# Patient Record
Sex: Male | Born: 1940
Health system: Southern US, Community
[De-identification: ages and names within clinical notes are randomized; demographics above are authoritative.]

## PROBLEM LIST (undated history)

## (undated) DIAGNOSIS — G20 Parkinson's disease: Secondary | ICD-10-CM

## (undated) DIAGNOSIS — I5042 Chronic combined systolic (congestive) and diastolic (congestive) heart failure: Secondary | ICD-10-CM

## (undated) DIAGNOSIS — I219 Acute myocardial infarction, unspecified: Secondary | ICD-10-CM

## (undated) DIAGNOSIS — G709 Myoneural disorder, unspecified: Secondary | ICD-10-CM

## (undated) DIAGNOSIS — K219 Gastro-esophageal reflux disease without esophagitis: Secondary | ICD-10-CM

## (undated) DIAGNOSIS — R32 Unspecified urinary incontinence: Secondary | ICD-10-CM

## (undated) DIAGNOSIS — I2510 Atherosclerotic heart disease of native coronary artery without angina pectoris: Secondary | ICD-10-CM

## (undated) DIAGNOSIS — I059 Rheumatic mitral valve disease, unspecified: Secondary | ICD-10-CM

## (undated) DIAGNOSIS — I10 Essential (primary) hypertension: Secondary | ICD-10-CM

## (undated) DIAGNOSIS — E785 Hyperlipidemia, unspecified: Secondary | ICD-10-CM

## (undated) DIAGNOSIS — F419 Anxiety disorder, unspecified: Secondary | ICD-10-CM

## (undated) DIAGNOSIS — I429 Cardiomyopathy, unspecified: Secondary | ICD-10-CM

## (undated) DIAGNOSIS — F0390 Unspecified dementia without behavioral disturbance: Secondary | ICD-10-CM

## (undated) DIAGNOSIS — G20A1 Parkinson's disease without dyskinesia, without mention of fluctuations: Secondary | ICD-10-CM

## (undated) DIAGNOSIS — I739 Peripheral vascular disease, unspecified: Secondary | ICD-10-CM

## (undated) HISTORY — DX: Atherosclerotic heart disease of native coronary artery without angina pectoris: I25.10

## (undated) HISTORY — PX: BLADDER REPAIR: SHX76

## (undated) HISTORY — PX: PROSTATECTOMY: SHX69

## (undated) HISTORY — PX: BLADDER SURGERY: SHX569

---

## 2002-06-06 ENCOUNTER — Inpatient Hospital Stay (HOSPITAL_COMMUNITY): Admission: EM | Admit: 2002-06-06 | Discharge: 2002-06-08 | Payer: Self-pay | Admitting: *Deleted

## 2004-05-02 ENCOUNTER — Emergency Department (HOSPITAL_COMMUNITY): Admission: EM | Admit: 2004-05-02 | Discharge: 2004-05-02 | Payer: Self-pay | Admitting: Emergency Medicine

## 2006-09-08 ENCOUNTER — Encounter (INDEPENDENT_AMBULATORY_CARE_PROVIDER_SITE_OTHER): Payer: Self-pay | Admitting: Specialist

## 2006-09-08 ENCOUNTER — Inpatient Hospital Stay (HOSPITAL_COMMUNITY): Admission: RE | Admit: 2006-09-08 | Discharge: 2006-09-14 | Payer: Self-pay | Admitting: Urology

## 2006-09-30 ENCOUNTER — Inpatient Hospital Stay (HOSPITAL_COMMUNITY): Admission: EM | Admit: 2006-09-30 | Discharge: 2006-10-04 | Payer: Self-pay | Admitting: Emergency Medicine

## 2006-10-07 ENCOUNTER — Encounter (HOSPITAL_COMMUNITY): Admission: RE | Admit: 2006-10-07 | Discharge: 2006-11-06 | Payer: Self-pay | Admitting: Urology

## 2006-10-08 ENCOUNTER — Ambulatory Visit (HOSPITAL_COMMUNITY): Payer: Self-pay | Admitting: Urology

## 2006-10-20 ENCOUNTER — Ambulatory Visit (HOSPITAL_COMMUNITY): Admission: RE | Admit: 2006-10-20 | Discharge: 2006-10-20 | Payer: Self-pay | Admitting: Family Medicine

## 2009-08-28 ENCOUNTER — Ambulatory Visit (HOSPITAL_COMMUNITY): Admission: RE | Admit: 2009-08-28 | Discharge: 2009-08-28 | Payer: Self-pay | Admitting: Chiropractic Medicine

## 2010-02-25 ENCOUNTER — Ambulatory Visit (HOSPITAL_COMMUNITY): Admission: RE | Admit: 2010-02-25 | Discharge: 2010-02-25 | Payer: Self-pay | Admitting: Internal Medicine

## 2010-03-24 ENCOUNTER — Encounter: Payer: Self-pay | Admitting: Internal Medicine

## 2010-04-09 ENCOUNTER — Telehealth (INDEPENDENT_AMBULATORY_CARE_PROVIDER_SITE_OTHER): Payer: Self-pay

## 2010-04-10 ENCOUNTER — Encounter: Payer: Self-pay | Admitting: Internal Medicine

## 2010-04-14 ENCOUNTER — Ambulatory Visit: Payer: Self-pay | Admitting: Internal Medicine

## 2010-04-14 ENCOUNTER — Ambulatory Visit (HOSPITAL_COMMUNITY): Admission: RE | Admit: 2010-04-14 | Discharge: 2010-04-14 | Payer: Self-pay | Admitting: Internal Medicine

## 2010-05-05 ENCOUNTER — Ambulatory Visit (HOSPITAL_COMMUNITY): Admission: RE | Admit: 2010-05-05 | Discharge: 2010-05-05 | Payer: Self-pay | Admitting: Family Medicine

## 2011-01-15 NOTE — Letter (Signed)
Summary: Internal Other Domingo Dimes  Internal Other Domingo Dimes   Imported By: Cloria Spring LPN 16/09/9603 54:09:81  _____________________________________________________________________  External Attachment:    Type:   Image     Comment:   External Document

## 2011-01-15 NOTE — Progress Notes (Signed)
Summary: phone note/ pt on iron per wife  Phone Note Call from Patient   Caller: Spouse Summary of Call: Pt's wife called to say pt is scheduled for TCS on 04/14/2010. He has been on iron once daily for the last 3 weeks, he is anemic.  He takes the iron at night. I told her to have him hold the iron. I will check with Dr. Jena Gauss and see if he needs to be rescheduled. I also have faxed a request to [facility] for a copy of his recent labs. Initial call taken by: Cloria Spring LPN,  April 09, 2010 11:53 AM     Appended Document: phone note/ pt on iron per wife holding iron for now until the colonoscopy should be okay  Appended Document: phone note/ pt on iron per wife pts wife aware

## 2011-01-15 NOTE — Letter (Signed)
Summary: External Other /Labs from Dr. Sherwood Gambler  External Other Vickie Epley from Dr. Sherwood Gambler   Imported By: Cloria Spring LPN 16/09/9603 54:09:81  _____________________________________________________________________  External Attachment:    Type:   Image     Comment:   External Document

## 2011-03-04 ENCOUNTER — Emergency Department (HOSPITAL_COMMUNITY): Payer: PRIVATE HEALTH INSURANCE

## 2011-03-04 ENCOUNTER — Emergency Department (HOSPITAL_COMMUNITY)
Admission: EM | Admit: 2011-03-04 | Discharge: 2011-03-04 | Disposition: A | Discharge status: Home / Self Care | Payer: PRIVATE HEALTH INSURANCE | Attending: Emergency Medicine | Admitting: Emergency Medicine

## 2011-03-04 DIAGNOSIS — G20 Parkinson's disease: Secondary | ICD-10-CM | POA: Insufficient documentation

## 2011-03-04 DIAGNOSIS — I10 Essential (primary) hypertension: Secondary | ICD-10-CM | POA: Insufficient documentation

## 2011-03-04 DIAGNOSIS — IMO0002 Reserved for concepts with insufficient information to code with codable children: Secondary | ICD-10-CM | POA: Insufficient documentation

## 2011-03-04 DIAGNOSIS — R112 Nausea with vomiting, unspecified: Secondary | ICD-10-CM | POA: Insufficient documentation

## 2011-03-04 DIAGNOSIS — G20A1 Parkinson's disease without dyskinesia, without mention of fluctuations: Secondary | ICD-10-CM | POA: Insufficient documentation

## 2011-03-04 DIAGNOSIS — S7000XA Contusion of unspecified hip, initial encounter: Secondary | ICD-10-CM | POA: Insufficient documentation

## 2011-03-04 DIAGNOSIS — Z79899 Other long term (current) drug therapy: Secondary | ICD-10-CM | POA: Insufficient documentation

## 2011-03-04 DIAGNOSIS — W108XXA Fall (on) (from) other stairs and steps, initial encounter: Secondary | ICD-10-CM | POA: Insufficient documentation

## 2011-03-04 DIAGNOSIS — M549 Dorsalgia, unspecified: Secondary | ICD-10-CM | POA: Insufficient documentation

## 2011-03-04 DIAGNOSIS — S0990XA Unspecified injury of head, initial encounter: Secondary | ICD-10-CM | POA: Insufficient documentation

## 2011-05-01 NOTE — Op Note (Signed)
NAMECUINN, WESTERHOLD               ACCOUNT NO.:  [id number]   MEDICAL RECORD NO.:  [id number]          PATIENT TYPE:  AMB   LOCATION:  DAY                           FACILITY:  [facility]   PHYSICIAN:  Ky Barban, M.D.DATE OF BIRTH:  June 21, 1941   DATE OF PROCEDURE:  09/08/2006  DATE OF DISCHARGE:                                 OPERATIVE REPORT   PREOPERATIVE DIAGNOSIS:  Carcinoma of prostate.   POSTOPERATIVE DIAGNOSIS:  Carcinoma of prostate.   OPERATION PERFORMED:  Bilateral pelvic node dissection, radical retropubic  prostatectomy, frozen sections.   SURGEON:  Ky Barban, M.D.   ASSISTANT:  Dennie Maizes, M.D.   ANESTHESIA:  General endotracheal.   ESTIMATED BLOOD LOSS:  2000 mL with replacement, 4 units of packed red blood  cells.   Instrument, needle, sponge count correct.   COMPLICATIONS:  None.   DESCRIPTION OF PROCEDURE:  The patient was given general endotracheal  anesthesia and semilithotomy position after usual prep and drape.  Suprapubic midline incision was made about three inches long, carried down  through the subcutaneous tissue.  The rectus sheath was incised, rectus  separated in the midline, retropubic space was entered.  The right and left  external iliac veins were exposed and self-retaining Turner-Warwick  retractors applied.  Proceeded to do the node dissection on the right side.  The fascia around the right external iliac vein was opened, obturator nerve  was identified and the tissue in between the external iliac vein and  obturator vessels and nerve was lifted off the psoas fascia with blunt and  sharp dissection.  Lymphatics were clipped and divided.  This tissue was  collected as the lymph nodes, pelvic and obturator lymph nodes, the area was  packed with a dry pack and same thing was done on the left side.  Specimen  was sent for frozen section.  The report came back negative for metastatic  disease.  Proceeded to the radical  prostatectomy.  The patient already had a  #20 Foley catheter in the bladder.  The puboprostatic ligaments were divided  and the apex of the prostate was exposed.  Stitch was placed using 0 Vicryl  in the dorsal vein complex and a second stitch was placed on the apex of the  prostate and between these two stitches, dorsal vein complex was divided.  The puboprostatic ligaments were divided.  The prostate was release and  McDougal clamp was passed behind the remaining part of the dorsal vein  complex.  It was ligated and divided.  Now a long rectangular tape was  passed behind the urethra, lifted it up and under direct vision, the  anterior wall of the urethra was divided.  I wanted to put a running stitch  of 3-0 Vicryl in the anterior wall of the bladder but this started to bleed  from the dorsal vein complex, so I had to grab the Foley catheter with the  long hemostat and divide it.  The posterior wall of the urethra was divided.  The prostate was lifted off the Denonvilliers's fascia and this area was  packed  to stop the bleeding.  Then I continued to do the dissection of the  prostate.  The rectum was pushed away from this side.  I had difficulty  exposing the seminal vesicles so it was decided to go from the front.  Anterior bladder neck was opened up and under direct vision the bladder neck  was exposed.  The Foley catheter was then removed.  A #14 red rubber  catheter was left in the prostate, held on a clamp.  The ureteral orifices  were identified and two 4 French ureteral catheters were passed up, one on  each side and held in the bladder with 4-0 chromic stitch.  Posterior  bladder neck was divided.  The bladder was separated from the prostate.  The  seminal vesicle and vasa deferentia were identified.  The vasa deferentia  were clipped and divided.  Right seminal vesicle was pulled out of the  sheath of the seminal vesicle artery was clipped.  The left seminal vesicle  was quite  stuck.  With some difficulty, I was able to get part of it out and  the remaining part separately taken out and once the seminal vesicles were  out, I had already ligated the superior pedicles of the prostate.  The  specimen came out.  The operative site was packed.  Proceeded to close the  bladder neck.  Bladder neck was closed with interrupted sutures of 2-0  Vicryl and it was left open size of my index finger, then the bladder neck  was covered with running suture of 4-0 chromic.  It was covered with the  bladder mucosa with running stitch.  Now I am ready for the anastomosis.  The packing was removed from the pelvis.  The operative site was irrigated  with about 1200 mL of saline.  I do not see any bleeding from the operative  site but there was a large bleeder in the dorsal vein complex so we put a  Foley catheter and I had to inflate the balloon and with traction in the  pelvic area, I was able to control the bleeding but we decided that it was  impossible to do the end-to-end anastomosis, maybe we will just have to use  traction on the catheter to approximate the bladder neck with the urethral  stump.  So I had to make a cystotomy to put a 0 Vicryl stitch through the  open end of the catheter.  It came out through the abdominal wall so that  the catheter does not come out, so the cystotomy was done and closed in  usual fashion.  The Omni retractor blades were removed.  I had already  placed the catheter into the bladder, inflated the balloon to 40 mL, so with  traction on the catheter, the bladder was pushed into the retropubic space  approximating the urethral stump with the bladder neck.  The patient had a  good stitch on the dorsal vein complex and also on the bladder neck, so  these stitches were tied.  Hopefully it will keep, along with the traction,  the stump of the urethra and the bladder neck in approximation.  There is no bleeding going on.  Retropubic space was drained with  __________  which came  out along with the urethral catheters through a separate stab wound and  stabilized to the skin with a 0 silk stitch and the rectus sheath was closed  with a running stitch of 0 Vicryl.  Skin was closed  with staples.  The Foley  catheter was held on traction during the procedure.  There was no bleeding  going on, so we decided to keep the catheter on traction.  Patient lost  about 2000 mL of blood.  He was given four units of blood.  He has stable  blood pressure running 140/80.  The Foley catheter free tie is tied over a  button on the skin and sterile dressing was applied.  Traction was applied  on the Foley catheter.  The patient left the operating room in satisfactory  condition.      Ky Barban, M.D.  Electronically Signed     MIJ/MEDQ  D:  09/08/2006  T:  09/10/2006  Job:  [id number]

## 2011-05-01 NOTE — Consult Note (Signed)
[name], [name]               ACCOUNT NO.:  [id number]   MEDICAL RECORD NO.:  [id number]          PATIENT TYPE:  INP   LOCATION:  IC03                          FACILITY:  [facility]   PHYSICIAN:  Mobolaji B. Bakare, M.D.DATE OF BIRTH:  May 14, 1941   DATE OF CONSULTATION:  DATE OF DISCHARGE:                                   CONSULTATION   REASON FOR CONSULTATION:  Hypoglycemia and low calcium   HISTORY OF PRESENT ILLNESS:  [name] is a 70 year old Caucasian male who  was admitted on September 08, 2006, for radical prostatectomy.  He underwent  surgery on the same day.  The patient was noted on BMET result early this  morning  over 500.  He has had a relatively normal glucose level since  admission ranging between 130-150.  He is not diabetic.  Albumin was also  noted to be 1.9.  [facility] was therefore called to appoint management.  The  patient received 11 units of insulin.  Two hours later he bottomed out and  became hypoglycemic and sweaty.  Blood glucose was 38 mg/dL.  He was treated  with  D-50, one ampoule and blood glucose now is 113.   Calcium was also noted to be 6.9.  Albumin was checked to be 1.9.  These  correspond to a corrected calcium of 8.5, which is within normal limits.  The patient has received one calcium gluconate.   REVIEW OF SYSTEMS:  The patient complained of sore throat.  No fever,  chills, cough, headache.  He is not having any diarrhea.  No abdominal pain.  Review of systems, otherwise, normal.   PAST MEDICAL HISTORY:  1. Depression.  2. Hypertension.  3. Anxiety.   PAST SURGICAL HISTORY:  Cholecystectomy status post inguinal hernia repair.   CURRENT MEDICATIONS:  1. Cefazolin 1 g IV q.8 h.  2. NovoLog sliding scale.  3. Morphine sulfate PCA.  4. Benadryl p.r.n.  5. Reglan p.r.n.  6. Zofran p.r.n.   MEDICATION PRIOR TO HOSPITALIZATION:  1. Paxil.  2. Xanax.  3. Hydrochlorothiazide.   ALLERGIES:  None.   FAMILY HISTORY:  Positive for  leukemia.   SOCIAL HISTORY:  The patient does not drink alcohol, does not smoke  cigarettes.   PHYSICAL EXAMINATION:  VITAL SIGNS:  Currently, blood pressure 140/70, heart  rate 95-100, O2 saturations 98% on 2 liters, respirations 15, I&O's  2513/2460  NG tube drainage 800.  Blood stained.  GENERAL:  Patient is not in  respiratory distress.  HEENT:  Normocephalic, atraumatic.  Pupils equal, round and reactive to  light.  NG tube in place, draining blood coffee-ground material.  Mucous  membranes moist without thrush.  LUNGS:  Clear to auscultation anteriorly.  CVS:  S1, S2.  No murmur, no gallop.  ABDOMEN:  Nondistended, soft, nontender.  Dressing over lower abdominal  wound.  Foley catheter in place.  EXTREMITIES:  No pedal edema.  No calf tenderness.  CNS:  No focal neurological deficits.   LABORATORY DATA:  Sodium 130, potassium 3.3, chloride 98, CO2 29, glucose  529, BUN 11, creatinine 1.0, calcium 6.9,  white cell 9.4, hemoglobin 10,  hematocrit 28.8, platelets 110, albumin 1.9.   ASSESSMENT/PLAN:  1. Hyperglycemia.  The patient one BMET reading of  glucose.  He became      hypoglycemic on receiving 11 units of insulin.  I doubt he is diabetic.      This may be spurious .  Will check hemoglobin A1C.  Monitor CBG q.6 h.      Will initiate sliding scale insulin if blood glucose is greater than      180.  The patient is n.p.o. for now.  2. Coffee-ground NG tube drainage, likely stress also.  We started      Protonix 20 mg q.12 h.  3. Thrombocytopenia.  The patient is not on heparin product.  Will monitor      closely.  There is no available baseline platelet at this time.  4. Oral thrush.  I will initiate Diflucan 200 mg IV x1, then 100 mg daily.  5. Hypocalcemia.  Corrected calcium is within low limits of normal.  Will      monitor closely.  He has received one ampoule of calcium gluconate .  6. Hypoalbuminemia.  Check prealbumin.  The patient may need nutritional       supplement when able to take orally.  7. Blood loss anemia.  He is status post blood transfusion.  Will continue      to monitor.  8. E. coli and urinary tract infection.  Continue cefazolin.  9. Hypokalemia.  Will continue repleting potassium in IV fluids.  Will      give two potassium chloride 10 mEq.  10.Depression.  11.Hypertension.  It is fairly controlled.  If he continues to be n.p.o.      and unable to medications, will consider Lopressor IV q.6 h at 5 mg IV      q.6h if blood pressure becomes uncontrolled.  12.Prostate cancer status post radical prostatectomy and management as per      primary service .   Thank you for the consultation, we will follow with you.      Mobolaji B. Corky Downs, M.D.  Electronically Signed    MBB/MEDQ  D:  09/10/2006  T:  09/10/2006  Job:  [id number]   cc:   Ky Barban, M.D.  Fax: [phone]

## 2011-05-01 NOTE — H&P (Signed)
[name], [name] NO.:  [id number]   MEDICAL RECORD NO.:  [id number]          PATIENT TYPE:  INP   LOCATION:  IC03                          FACILITY:  [facility]   PHYSICIAN:  Ky Barban, M.D.DATE OF BIRTH:  Apr 30, 1941   DATE OF ADMISSION:  09/08/2006  DATE OF DISCHARGE:  LH                                HISTORY & PHYSICAL   CHIEF COMPLAINT:  Prostate cancer.   A 70 year old male who is a patient of Dr. Rito Ehrlich and was primarily there  to see him in July, for an elevated PSA of 4.34 and last month he underwent  prostate ultrasound and biopsy.  The biopsy report came back positive for  adenocarcinoma of prostate and his Gleason grade is  3+2, Gleason score is  5.  The patient is otherwise in good general medical condition, so he is  advised.  He was seen both by me and Dr. Rito Ehrlich and we have advised him to  undergo radical prostatectomy although we also discussed other alternative  treatments, radiotherapy and watchful waiting, hormone treatment.  Because  of his age and good health, I think he is better off having radical  prostatectomy.  The procedure of radical prostatectomy was discussed with  him__________ told him that there are complications:  (1) Urinary  incontinence, which can be permanent; (2) erectile dysfunction which will be  permanent; (3) bleeding requiring blood transfusion in addition to usual  complication of any major operation.  He understands along with his family,  wants [place] to go ahead and do the surgery as soon as possible.  So he is  coming as outpatient to undergo radical retropubic prostatectomy, bilateral  pelvic node dissection, and then will be admitted in the hospital.   PAST HISTORY:  History of hypertension, depression, anxiety, status post  cholecystectomy, and status post inguinal hernia repair.   MEDICATIONS:  He is taking Paxil, Xanax, hydrochlorothiazide.   ALLERGIES:  None.   FAMILY HISTORY:  Positive for  leukemia.  There is no history of prostate  cancer.   PERSONAL HISTORY:  He does not smoke or drink.   REVIEW OF SYSTEMS:  Unremarkable.   PHYSICAL EXAMINATION:  GENERAL:  A well-nourished, well-developed male not  in acute distress.  VITAL SIGNS:  Blood pressure is 130/80, temperature is normal.  CENTRAL NERVOUS SYSTEM:  No gross neurological deficit.  HEAD AND NECK:  Negative.  CHEST:  Symmetrical, normal breath sounds.  CARDIAC:  Regular sinus rhythm.  No murmur.  ABDOMEN:  Soft, flat.  Liver, spleen, kidneys not felt.  There was no CVA  tenderness.  GENITOURINARY:  External genitalia is circumcised, meatus adequate.  Testicles are normal.  RECTAL:  Done by Dr. Rito Ehrlich and the prostate is 45 g, feels benign.   IMPRESSION:  1. Prostate cancer.  2. Hypertension.  3. Depression.   PLAN:  A radical retropubic prostatectomy, bilateral pelvic node dissection,  and admit him in the hospital and I will request Dr. Regino Schultze to follow him  in the hospital with [place].      Ky Barban, M.D.  Electronically  Signed     MIJ/MEDQ  D:  09/07/2006  T:  09/08/2006  Job:  [id number]   cc:   [facility]  Fax: [phone]   Kirk Ruths, M.D.  Fax: [phone]

## 2011-05-01 NOTE — Op Note (Signed)
[facility]  Patient:    [name], [name] Visit Number: [id number] MRN: [id number]          Service Type: MED Location: 3A A332 01 Attending Physician:  Dalia Heading Dictated by:   Franky Macho, M.D. Proc. Date: 06/07/02 Admit Date:  06/06/2002 Discharge Date: 06/08/2002   CC:         Jonell Cluck, M.D.  Renne Musca, M.D.   Operative Report  PREOPERATIVE DIAGNOSIS:  Acute appendicitis.  POSTOPERATIVE DIAGNOSIS:  Acute appendicitis.  PROCEDURE:  Laparoscopic appendectomy.  SURGEON:  Franky Macho, M.D.  ANESTHESIA:  General endotracheal.  INDICATIONS FOR PROCEDURE:  The patient is a 70 year old white male who presents with lower abdominal pain. CT scan of the abdomen and pelvis was performed which revealed possible early acute appendicitis. The risks and benefits of the procedure including bleeding, infection, and the possibility of an open procedure were fully explained to the patient, who gave informed consent.  DESCRIPTION OF PROCEDURE:  The patient was placed in the Trendelenburg position after induction of general endotracheal anesthesia. The abdomen was prepped and draped using the usual sterile technique with Betadine.  A supraumbilical incision was made down to the fascia. A Veress needle was introduced into the abdominal cavity and confirmation of placement was done using the saline drop test. The abdomen was then insufflated to 16 mmHg pressure. An 11 mm trocar was introduced into the abdominal cavity under direct visualization without difficulty. An additional 12 mm trocar was placed in the suprapubic region and a 5 mm trocar was placed in the left lower quadrant region. The appendix was inspected and noted to be diffusely inflamed and hard. The mesoappendix was divided using the harmonic scalpel. The base of the appendix was noted to supple. An endoGIA was placed across the base of the appendix and fired. The appendix  was removed using an endocatch bag. The appendiceal remnant was inspected and no abnormal bleeding was noted. There was no evidence of perforation. No other abnormal findings were found in the right lower quadrant. All fluid was then evacuated from the abdominal cavity prior to removal of the trocars.  All wounds were irrigated with normal saline. All wounds were injected with 0.5% Sensorcaine. The supraumbilical fascia as well as suprapubic fascia were reapproximated using an #0 Vicryl interrupted suture. All skin incisions were closed using staples. Betadine ointment and dry sterile dressings were applied.  All tape and needle counts were correct at the end of the procedure. The patient was extubated in the operating room and went back to the recovery room awake in stable condition.  COMPLICATIONS:  None.  SPECIMEN:  Appendix.  ESTIMATED BLOOD LOSS:  Minimal. Dictated by:   Franky Macho, M.D. Attending Physician:  Dalia Heading DD:  06/07/02 TD:  06/08/02 Job: [id number]

## 2011-05-01 NOTE — Discharge Summary (Signed)
[name], Ernest NO.:  [id number]   MEDICAL RECORD NO.:  [id number]          PATIENT TYPE:  INP   LOCATION:  A215                          FACILITY:  [facility]   PHYSICIAN:  Ky Barban, M.D.DATE OF BIRTH:  02-07-1941   DATE OF ADMISSION:  09/08/2006  DATE OF DISCHARGE:  10/02/2007LH                               DISCHARGE SUMMARY   HISTORY:  This 70 year old gentleman was admitted with diagnosis of  prostatic cancer to undergo radical retropubic prostatectomy.  His PSA  was elevated to 4.34.  A prostate biopsy shows that he has a Gleason  grade 3+ prostate cancer.  He was advised and discussed various  treatment options, so it was elected that he wanted to go ahead and have  surgery.  Various complications including urinary incontinence were  discussed and he understands.   HOSPITAL COURSE:  Routine admission workup CBC, urinalysis, and  __________ was normal.  EKG and chest x-ray were normal.  He was taken  to the operating room on September 26 and underwent a radical retropubic  prostatectomy.  The patient lost a considerable amount of blood.  He  required 4 units of blood during the surgery.  Postoperative course the  first postoperative day, September 27 his blood pressure was 140/80,  pulse 100/min and temperature 97.5.  General medical condition is good.  Abdomen is soft, nondistended.  Bowel sounds are present.  Dressing was  wet and was changed.  Intake and output was satisfactory.  NG output was  600 mL, some drainage 110 mL.  His lab workup showed WBC 10.4,  hematocrit 32.9, sodium 136, potassium 3.8, chloride 103, CO2 29,  glucose 135, BUN 15, creatinine 1.  The first postoperative day was  satisfactory.  We changed his dressing, and increased his IV to 150 mL  an hour.  The patient was found to have some hyperglycemia, so medical  consult was obtained and then later on it was realized that the blood  was drawn from the IV site and followup  blood glucose has been negative.  On that day the blood glucose was 529 which was very high.  The second  postoperative day the general status is good.  Blood pressure is  130/100, temperature is 99, O2 saturation is 100%.  Abdomen is  nondistended, bowel sounds are present.  Dressing is dry.  NG output is  only 10 mL.  Urine output is 350 mL/8 hours.  Sump drainage was only 10  mL.  So I discontinued the NG suction and DC correction on his Foley  catheter and encouraged incentive sphincterotomy.   The third postoperative day he has continued to do well.  On September  30 we discontinued his sump and urethral catheter, changed his dressing.  He was discharged from ICU and his diet was advanced to a regular diet  on October 2.  He is afebrile, general status is good.  Wound looks fine  and he is up and walking around, eating regular food.  Final pathology  reports shows a right and left iliac and obturator lymph nodes.  No  tumor is identified. His TNM code is PT2A, PNO, PMX.  The surgical  margins are negative.  His Gleason's score is 3 + 3 equals 6.  The  pathology report looks pretty good; and at this point he is being  discharged with Foley catheter and I will see him back in the office  next week to take the staples out.   FINAL DISCHARGE DIAGNOSIS:  Prostate cancer.  He is given a prescription  for Percocet 1-2 q.6 h. p.r.n. #30.  He is advised to continue taking  his usual medications for blood pressure, Celexa.      Ky Barban, M.D.  Electronically Signed     MIJ/MEDQ  D:  11/09/2006  T:  11/09/2006  Job:  [id number]

## 2011-05-01 NOTE — H&P (Signed)
NAME:  Ernest, Long               ACCOUNT NO.:  [id number]   MEDICAL RECORD NO.:  [id number]          PATIENT TYPE:  INP   LOCATION:  A326                          FACILITY:  [facility]   PHYSICIAN:  Ky Barban, M.D.DATE OF BIRTH:  07/23/1941   DATE OF ADMISSION:  09/30/2006  DATE OF DISCHARGE:  LH                                HISTORY & PHYSICAL   CHIEF COMPLAINT:  Painful swelling of left upper arm.   HISTORY:  A 70 year old gentleman one month ago underwent radical retropubic  prostatectomy for carcinoma of the prostate.  He did well; sent home with a  Foley catheter.  Yesterday he came to the office complaining that he has a  little bit of a knot and some tenderness in his left upper arm.  On  examination I found out that he is developing cellulitis and so I started  him on Levaquin.  Today we checked him again, and it is not really  responding.  But it may be too early and I want him to have IV antibiotics  and have a little bit more aggressive treatment so that before the  cellulitis comes into necrosis or abscess formation, maybe it will reverse  its course with IV antibiotics.  He is here and I am going to admit him and  start him on IV vancomycin.  Otherwise, he is doing fine.  He does have an  open wound on his sacrum, which does not look like decubitus ulcer.  He said  that it was there since he had his surgery, but he never told me until  yesterday.  There is a 1-inch clear slit-like opening (about an inch long)  in the right over the sacrum.  The skin looks absolutely healthy, but it is  underlying some necrotic tissue covering the sacral wound.  His wife has  been cleaning it and just applying Neosporin.  I think it is doing fine.  He  still has a Foley catheter, which I was supposed to take out in 3 weeks; but  because of this problem I told him I would keep it there until I see that  there is improvement in the cellulitis.   PAST MEDICAL HISTORY:  Per old  records.   REVIEW OF SYSTEMS:  Unremarkable.   PHYSICAL EXAMINATION:  GENERAL: A well-nourished, well-developed male, in no  acute distress.  VITAL SIGNS:  Temperature 97.5, blood pressure 121/84, pulse 88 per minute.  CENTRAL NERVOUS SYSTEM:  No gross neurological deficit.  HEAD/NECK/ENT:  Negative.  CHEST:  Symmetrical.  HEART:  Regular sinus rhythm.  ABDOMEN:  Soft, flat.  Liver, spleen, kidneys are not palpable.  No CVA  tenderness.  GU:  External genitalia has a Foley catheter in place.  Testicles are  normal.  RECTAL:  Deferred.  EXTREMITIES:  Normal.   IMPRESSION:  1. Cellulitis, left upper arm and a sacral wound.  2. Carcinoma of the prostate, post radical retropubic prostatectomy.   PLAN:  A routine admission workup; CBC, urinalysis, __________.  Start him  on IV vancomycin.  Will get pharmacy to calculate his  dose.  I will ask Dr.  Nobie Putnam, his family physician to follow along with him.      Ky Barban, M.D.  Electronically Signed     MIJ/MEDQ  D:  09/30/2006  T:  09/30/2006  Job:  [id number]   cc:   Patrica Duel, M.D.  Fax: [phone]

## 2011-05-01 NOTE — Discharge Summary (Signed)
NAME:  [name], [name]               ACCOUNT NO.:  [id number]   MEDICAL RECORD NO.:  [id number]          PATIENT TYPE:  INP   LOCATION:  A326                          FACILITY:  [facility]   PHYSICIAN:  Ky Barban, M.D.DATE OF BIRTH:  [birth year]   DATE OF ADMISSION:  DATE OF DISCHARGE:  LH                               DISCHARGE SUMMARY   CHIEF COMPLAINT:  Painful swelling of left upper arm.   HISTORY OF PRESENT ILLNESS:  This 70 year old gentleman recently  underwent a radial retropubic prostatectomy for carcinoma of the  prostate.  He developed redness and swelling of his left upper arm,  __________ swelling, and looks like he has developed phlebitis, and he  is developing cellulitis on that side, so I admitted him for aggressive  treatment.   HOSPITAL COURSE:  He was admitted and started on IV vancomycin.  He is  afebrile, having no other medical problems.  His WBC count is 3.5,  hematocrit is 32.4.  He was also closely followed by his medical doctor,  Dr. Nobie Putnam.  Over the next several days, he was continued on IV  vancomycin.  Also, he was followed by the pharmacy to calculate his  vancomycin dose, and, at this point, clinically there is improvement, he  is feeling better, but I want to keep giving him vancomycin maybe for  another 3-4 days, and we can made the arrangements for him to come as an  outpatient.   FINAL DIAGNOSIS:  Left upper arm phlebitis and cellulitis.   PLAN:  Continue IV vancomycin as an outpatient.      Ky Barban, M.D.  Electronically Signed     MIJ/MEDQ  D:  11/09/2006  T:  11/09/2006  Job:  [id number]

## 2011-05-01 NOTE — Group Therapy Note (Signed)
NAMEREYAAN, THOMA NO.:  [id number]   MEDICAL RECORD NO.:  [id number]          PATIENT TYPE:  INP   LOCATION:  IC03                          FACILITY:  [facility]   PHYSICIAN:  Margaretmary Dys, M.D.DATE OF BIRTH:  [birth year]   DATE OF PROCEDURE:  09/12/2006  DATE OF DISCHARGE:                                   PROGRESS NOTE   SUBJECTIVE:  Patient doing much better.  Says the lower abdominal pain is  markedly improved.  He denies any nausea or vomiting.  He has no fever or  chills. Overall, patient looks a lot better than he did yesterday.   OBJECTIVE:  GENERAL APPEARANCE:  Conscious, alert, comfortable, not in acute  distress.  VITAL SIGNS:  Blood pressure 125/72, pulse 68, respiratory rate 18, T-max  was 99.9 degrees F.  HEENT:  Oral mucosa was moist, no exudates.  NECK:  Supple.  No JVD.  LUNGS:  Reduced air entry bilaterally with no crackles or wheezing was  heard.  CARDIOVASCULAR:  S1 and S2 regular. No S3, S4, gallops or rubs.  ABDOMEN:  Soft with mild tenderness over the surgical area.  EXTREMITIES:  No edema.  No calf induration or tenderness was noted.   LABORATORY/DIAGNOSTIC DATA:  White blood cell count 5.8, hemoglobin 9.2,  hematocrit 26.5, platelet count 127.  Sodium 131, potassium 3.8, chloride  98, CO2 29, glucose 95, BUN 13, creatinine 0.9, calcium 7.9.   ASSESSMENT/PLAN:  1. Anemia likely secondary to blood loss.  I do not see any indication for      transfusion at this time.  Will continue to monitor his hemoglobin and      hematocrit.  2. Hypertension.  Patient is stable.  Not on any antihypertensives.  3. Hyperglycemia.  Again, hemoglobin A1c was 5.7 and subsequent blood      sugars have been in the normal range.  4. Urinary tract infection.  Patient had a low grade fever yesterday.      Will continue on ceftriaxone 1 g IV once a day.  5. Deep venous thrombosis prophylaxis with Lovenox status post surgery.      Patient is being followed  by urology.   DISPOSITION:  Overall, patient is improving.  Continue treatment for urinary  tract infection.      Margaretmary Dys, M.D.  Electronically Signed     AM/MEDQ  D:  09/12/2006  T:  09/12/2006  Job:  [id number]

## 2011-05-01 NOTE — Discharge Summary (Signed)
[facility]  Patient:    [name], [name] Visit Number: [id number] MRN: [id number]          Service Type: MED Location: 3A A332 01 Attending Physician:  Dalia Heading Dictated by:   Franky Macho, M.D. Admit Date:  06/06/2002 Discharge Date: 06/08/2002   CC:         Renne Musca, M.D.  Patrica Duel, M.D.   Discharge Summary  AGE:  70 years old.  HOSPITAL COURSE SUMMARY:  The patient is a 69 year old white male who presented to the emergency room with worsening diarrhea and lower abdominal pain. CT scan of the abdomen and pelvis was performed which revealed early acute appendicitis. Surgery consultation was obtained, and the patient was taken to the operating room on June 07, 2002 and underwent a laparoscopic appendectomy. Acute appendicitis was found. There was no evidence of perforation. His postoperative course has been unremarkable. His diarrhea has resolved. He has been afebrile for 24 hours.  DISPOSITION:  The patient is being discharged home on postoperative day #1 in good improving condition.  DISCHARGE INSTRUCTIONS:  The patient is to follow up with Dr. Franky Macho on June 15, 2002.  DISCHARGE MEDICATIONS:  Vicodin 1-2 tablets p.o. q.4 h. p.r.n. pain.  PRINCIPAL DIAGNOSIS:  Acute appendicitis.  PRINCIPAL PROCEDURE:  Laparoscopic appendectomy on June 07, 2002. Dictated by:   Franky Macho, M.D. Attending Physician:  Dalia Heading DD:  06/08/02 TD:  06/09/02 Job: [id number]

## 2011-05-01 NOTE — H&P (Signed)
[facility]  Patient:    [name], [name] Visit Number: [id number] MRN: [id number]          Service Type: MED Location: 3A A332 01 Attending Physician:  Corlis Leak. Dictated by:   Renne Musca, M.D. Admit Date:  06/06/2002                           History and Physical  DATE OF BIRTH:  Dec 12, 1941  HISTORY OF PRESENT ILLNESS:  The patient is a 70 year old Caucasian male who was in his usual state of health until approximately five days prior when he developed nausea, diarrhea, vomiting, and abdominal pain.  He noted some chills and low grade fevers.  He was seen by his primary care M.D., Dr. Nobie Putnam, and prescribed ciprofloxacin, Imodium, and Phenergan.  His symptoms improved a bit, but then persisted and seemed to get worse.  He had increasing abdominal discomfort and he was brought to the emergency room this evening. On presentation temperature was 99, blood pressure 130/70, heart rate 105.  He had some abdominal tenderness diffusely.  Rectal examination per the emergency room physician reveals brown heme-positive stool.  The patient had a CT of the abdomen which revealed some appendiceal thickening (1 cm in diameter) and some scattered right lower quadrant mesenteric lymph nodes raising the question of early appendicitis.  The patient relates having frequent loose stools throughout the day, particularly after eating.  His appetite has been poor. He generally feels weak.  He has had symptoms of this before.  No exposure history.  REVIEW OF SYSTEMS:  Essentially otherwise unremarkable.  PAST MEDICAL HISTORY:  He has had no hospitalizations or surgeries in the past.  MEDICATIONS:  He is on no medications aside those recently prescribed.  ALLERGIES:  The patient denies any allergies.  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 He chews tobacco, but does not smoke.  Limited to no alcohol use.  He  is married.  He has two stepchildren who are grown.  FAMILY HISTORY:  Noncontributory.  REVIEW OF SYSTEMS:  No weight loss.  No previous symptoms.  He was treated about a month ago with Biaxin for upper respiratory symptoms.  PHYSICAL EXAMINATION  GENERAL:  Alert, oriented, appropriate.  He is most comfortable lying on his left side with his right leg pulled up to his chest.  VITAL SIGNS:  Blood pressure 130/80, pulse 94 and regular, respirations unlabored.  LUNGS:  Clear to auscultation anterior and posterior.  HEART:  Regular rate and rhythm.  No murmur, gallop, or rub.  ABDOMEN:  Soft.  He has very active bowel sounds.  He has some mild diffuse tenderness in the lower quadrants, marked tenderness in the right lower quadrant with deep palpation and some early rebound and guarding.  RECTAL:  As per Dr. Josie Saunders.  EXTREMITIES:  Without clubbing, cyanosis, edema.  SKIN:  Without rash, lesion, or breakdown.  NEUROLOGIC:  Intact.  LABORATORY DATA:  Potassium 3.  White count 3.7 with 55 segs, 33 lymphs. Urinalysis revealed 7-10 white cells, few bacteria, and some casts which were granular.  Hemoglobin 13.9, hematocrit 39.  BUN 19, creatinine 1.2, glucose 119, albumin 3.1.  LFTs are normal.  Amylase and lipase are normal.  ASSESSMENT AND PLAN: 1. Nausea, vomiting, diarrhea consistent with acute gastroenteritis, although    given his CT findings, appendicitis needs to be considered as well.  He has  had recent antibiotic therapy raising the question of Clostridium difficile    colitis.  A Clostridium difficile toxin, stool ovum and parasite, and other    cultures are pending.  Will empirically begin Flagyl.  Given the fact the    patient also has a few white cells in his urine, although no symptoms,    raises the question of prostatitis, though he did not have prostate    tenderness on examination.  Will defer further antibiotic treatment until    cultures are returned or if  there is any change in his clinical status. 2. Hypokalemia.  The patient will be supplemented.  Surely, this is a result    of his diarrhea. Dictated by:   Renne Musca, M.D. Attending Physician:  Corlis Leak DD:  06/07/02 TD:  06/07/02 Job: [id number]

## 2011-05-01 NOTE — Group Therapy Note (Signed)
NAME:  [name], [name] NO.:  [id number]   MEDICAL RECORD NO.:  [id number]          PATIENT TYPE:  INP   LOCATION:  IC03                          FACILITY:  [facility]   PHYSICIAN:  Margaretmary Dys, M.D.DATE OF BIRTH:  Dec 24, 1940   DATE OF PROCEDURE:  DATE OF DISCHARGE:                                   PROGRESS NOTE   SUBJECTIVE:  The patient is complaining of severe lower abdominal pain from  the surgery site. We are following patient for his medical problems. He  denies any chest pain, no shortness of breath. He has had no fever or  chills. He does have a history of hypertension. He had severely elevated  blood sugar yesterday but follow up blood sugar with institution of sliding  scale actually made him more hypoglycemic. His hemoglobin A1C is 5.7 which  would suggest some borderline diabetes.   OBJECTIVE:  GENERAL:  The patient is alert, in mid pain distress.  VITAL SIGNS:  Blood pressure was 138/82, pulse of 83, respiratory rate of  16, T-max was 98.9 degrees F.  HEENT EXAM:  Normocephalic, atraumatic. Oral mucosa dry, no exudates.  NECK:  Supple, no JVD.  LUNGS:  Decreased air entry bilaterally, no crackles or wheezing.  HEART:  S1, S2 regular, no S3, S4, gallops or rubs.  ABDOMEN:  Soft with post-surgical changes.  EXTREMITIES:  No pitting pedal edema. No calf induration, no tenderness was  noted.   LABORATORY/DIAGNOSTIC DATA:  White blood cell count was 9600, hemoglobin of  10, hematocrit 28.5, platelet count 112,000 with 82% neutrophils. Sodium  134, potassium 3.9, chloride of 99, CO2 of 30, glucose of 93, BUN 15,  creatinine was 1.0, calcium 8.0.   ASSESSMENT AND PLAN:  1. Hypertension. This is stable at this time. Will continue on his      antihypertensive medication which is IV metoprolol given p.r.n. due to      NG tube placement - patient is not able to take p.o. for now. Will      continue to watch and if needed we may need to start him on a  Clonidine      patch.  2. Hyperglycemia. Probably a spurious reading. Hemoglobin A1C is 5.7, will      continue to follow blood sugars and try to avoid insulin as he became      hypoglycemic after receiving insulin previously.  3. Urinary tract infection. Patient is currently on ceftriaxone 1 gram IV      daily.  4. Deep venous thrombosis prophylaxis with Lovenox status post surgery.      This has been followed by urology.  5. Anemia. Will continue to monitor this closely. He did have evidence of      coffee ground material from NG tube. However I do think that anemia may      be entirely postoperative. I do not see an urgent indication to      transfuse him. At this time will repeat a CBC in the morning and an H&H      at about 6 o'clock today.  Margaretmary Dys, M.D.  Electronically Signed     AM/MEDQ  D:  09/11/2006  T:  09/11/2006  Job:  [id number]

## 2011-07-21 ENCOUNTER — Encounter (HOSPITAL_COMMUNITY): Admission: RE | Admit: 2011-07-21 | Discharge: 2011-07-21 | Payer: Medicare Other | Source: Ambulatory Visit

## 2011-07-21 NOTE — Patient Instructions (Signed)
[name]  07/21/2011   Your procedure is scheduled on:  Tuesday, 07/28/11  Report to [facility] at 06:15 AM.  Call this number if you have problems the morning of surgery: [phone]   Remember:   Do not eat food:After Midnight.  Do not drink clear liquids: After Midnight.  Take these medicines the morning of surgery with A SIP OF WATER: Lisinopril, Sinemet, celexa   Do not wear jewelry, make-up or nail polish.  Do not wear lotions, powders, or perfumes. You may wear deodorant.  Do not shave 48 hours prior to surgery.  Do not bring valuables to the hospital.  Contacts, dentures or bridgework may not be worn into surgery.  Leave suitcase in the car. After surgery it may be brought to your room.  For patients admitted to the hospital, checkout time is 11:00 AM the day of discharge.   Patients discharged the day of surgery will not be allowed to drive home.  Name and phone number of your driver: driver  Special Instructions: Use eye drops as instructed.   Please read over the following fact sheets that you were given: Anesthesia Post-op Instructions   PATIENT INSTRUCTIONS POST-ANESTHESIA  IMMEDIATELY FOLLOWING SURGERY:  Do not drive or operate machinery for the first twenty four hours after surgery.  Do not make any important decisions for twenty four hours after surgery or while taking narcotic pain medications or sedatives.  If you develop intractable nausea and vomiting or a severe headache please notify your doctor immediately.  FOLLOW-UP:  Please make an appointment with your surgeon as instructed. You do not need to follow up with anesthesia unless specifically instructed to do so.  WOUND CARE INSTRUCTIONS (if applicable):  Keep a dry clean dressing on the anesthesia/puncture wound site if there is drainage.  Once the wound has quit draining you may leave it open to air.  Generally you should leave the bandage intact for twenty four hours unless there is drainage.  If the  epidural site drains for more than 36-48 hours please call the anesthesia department.  QUESTIONS?:  Please feel free to call your physician or the hospital operator if you have any questions, and they will be happy to assist you.

## 2011-07-24 ENCOUNTER — Encounter (HOSPITAL_COMMUNITY)
Admission: RE | Admit: 2011-07-24 | Discharge: 2011-07-24 | Disposition: A | Discharge status: Home / Self Care | Payer: Medicare Other | Source: Ambulatory Visit | Attending: Ophthalmology | Admitting: Ophthalmology

## 2011-07-24 ENCOUNTER — Other Ambulatory Visit: Payer: Self-pay

## 2011-07-24 ENCOUNTER — Encounter (HOSPITAL_COMMUNITY): Payer: Self-pay

## 2011-07-24 HISTORY — DX: Essential (primary) hypertension: I10

## 2011-07-24 HISTORY — DX: Hyperlipidemia, unspecified: E78.5

## 2011-07-24 HISTORY — DX: Anxiety disorder, unspecified: F41.9

## 2011-07-24 HISTORY — DX: Gastro-esophageal reflux disease without esophagitis: K21.9

## 2011-07-24 LAB — CBC
HCT: 34.1 % — ABNORMAL LOW (ref 39.0–52.0)
MCH: 32 pg (ref 26.0–34.0)
MCV: 94.2 fL (ref 78.0–100.0)
Platelets: 153 10*3/uL (ref 150–400)
RBC: 3.62 MIL/uL — ABNORMAL LOW (ref 4.22–5.81)

## 2011-07-24 LAB — BASIC METABOLIC PANEL
CO2: 23 mEq/L (ref 19–32)
Calcium: 9.8 mg/dL (ref 8.4–10.5)
Creatinine, Ser: 1.13 mg/dL (ref 0.50–1.35)
GFR calc Af Amer: >60 mL/min (ref >60)
GFR calc non Af Amer: >60 mL/min (ref >60)
Sodium: 136 mEq/L (ref 135–145)

## 2011-07-24 MED ORDER — LACTATED RINGERS IV SOLN: INTRAVENOUS | Status: DC

## 2011-07-24 NOTE — Patient Instructions (Signed)
[name]  07/24/2011   Your procedure is scheduled on:  07/28/11  Report to [facility] at [place] AM.  Call this number if you have problems the morning of surgery: [phone]   Remember:   Do not eat food:After Midnight.  Do not drink clear liquids: After Midnight.  Take these medicines the morning of surgery with A SIP OF WATER:sinemet, celexa, lisinopril   Do not wear jewelry, make-up or nail polish.  Do not wear lotions, powders, or perfumes. You may wear deodorant.  Do not shave 48 hours prior to surgery.  Do not bring valuables to the hospital.  Contacts, dentures or bridgework may not be worn into surgery.  Leave suitcase in the car. After surgery it may be brought to your room.  For patients admitted to the hospital, checkout time is 11:00 AM the day of discharge.   Patients discharged the day of surgery will not be allowed to drive home.  Name and phone number of your driver:family  Special Instructions: N/A   Please read over the following fact sheets that you were given: Pain Booklet, Anesthesia Post-op Instructions and Care and Recovery After Surgery   PATIENT INSTRUCTIONS POST-ANESTHESIA  IMMEDIATELY FOLLOWING SURGERY:  Do not drive or operate machinery for the first twenty four hours after surgery.  Do not make any important decisions for twenty four hours after surgery or while taking narcotic pain medications or sedatives.  If you develop intractable nausea and vomiting or a severe headache please notify your doctor immediately.  FOLLOW-UP:  Please make an appointment with your surgeon as instructed. You do not need to follow up with anesthesia unless specifically instructed to do so.  WOUND CARE INSTRUCTIONS (if applicable):  Keep a dry clean dressing on the anesthesia/puncture wound site if there is drainage.  Once the wound has quit draining you may leave it open to air.  Generally you should leave the bandage intact for twenty four hours unless there is  drainage.  If the epidural site drains for more than 36-48 hours please call the anesthesia department.  QUESTIONS?:  Please feel free to call your physician or the hospital operator if you have any questions, and they will be happy to assist you.     [facility] Anesthesia Department [address] [place] [phone]

## 2011-07-28 ENCOUNTER — Encounter (HOSPITAL_COMMUNITY): Payer: Self-pay | Admitting: Anesthesiology

## 2011-07-28 ENCOUNTER — Encounter (HOSPITAL_COMMUNITY): Payer: Self-pay | Admitting: *Deleted

## 2011-07-28 ENCOUNTER — Ambulatory Visit (HOSPITAL_COMMUNITY): Payer: Medicare Other | Admitting: Anesthesiology

## 2011-07-28 ENCOUNTER — Encounter (HOSPITAL_COMMUNITY)
Admission: RE | Disposition: A | Discharge status: Home / Self Care | Payer: Self-pay | Source: Ambulatory Visit | Attending: Ophthalmology

## 2011-07-28 ENCOUNTER — Ambulatory Visit (HOSPITAL_COMMUNITY)
Admission: RE | Admit: 2011-07-28 | Discharge: 2011-07-28 | Disposition: A | Discharge status: Home / Self Care | Payer: Medicare Other | Source: Ambulatory Visit | Attending: Ophthalmology | Admitting: Ophthalmology

## 2011-07-28 DIAGNOSIS — H2510 Age-related nuclear cataract, unspecified eye: Secondary | ICD-10-CM | POA: Insufficient documentation

## 2011-07-28 DIAGNOSIS — Z79899 Other long term (current) drug therapy: Secondary | ICD-10-CM | POA: Insufficient documentation

## 2011-07-28 DIAGNOSIS — I10 Essential (primary) hypertension: Secondary | ICD-10-CM | POA: Insufficient documentation

## 2011-07-28 DIAGNOSIS — Z01812 Encounter for preprocedural laboratory examination: Secondary | ICD-10-CM | POA: Insufficient documentation

## 2011-07-28 DIAGNOSIS — G20 Parkinson's disease: Secondary | ICD-10-CM | POA: Insufficient documentation

## 2011-07-28 DIAGNOSIS — G20A1 Parkinson's disease without dyskinesia, without mention of fluctuations: Secondary | ICD-10-CM | POA: Insufficient documentation

## 2011-07-28 HISTORY — PX: CATARACT EXTRACTION W/PHACO: SHX586

## 2011-07-28 SURGERY — PHACOEMULSIFICATION, CATARACT, WITH IOL INSERTION
Anesthesia: Monitor Anesthesia Care | Site: Eye | Laterality: Right | Wound class: Clean

## 2011-07-28 MED ORDER — EPINEPHRINE HCL 1 MG/ML IJ SOLN
INTRAMUSCULAR | Status: AC
  Filled 2011-07-28: qty 1

## 2011-07-28 MED ORDER — EPINEPHRINE HCL 1 MG/ML IJ SOLN
INTRAOCULAR | Status: DC | PRN
  Administered 2011-07-28: 08:00:00

## 2011-07-28 MED ORDER — FLURBIPROFEN SODIUM 0.03 % OP SOLN
OPHTHALMIC | Status: AC
  Administered 2011-07-28: 1 [drp]
  Filled 2011-07-28: qty 2.5

## 2011-07-28 MED ORDER — KETOROLAC TROMETHAMINE 0.5 % OP SOLN
1.0000 [drp] | OPHTHALMIC | Status: AC
  Administered 2011-07-28 (×3): 1 [drp] via OPHTHALMIC

## 2011-07-28 MED ORDER — LIDOCAINE HCL 3.5 % OP GEL
OPHTHALMIC | Status: AC
  Filled 2011-07-28: qty 5

## 2011-07-28 MED ORDER — MIDAZOLAM HCL 2 MG/2ML IJ SOLN
1.0000 mg | INTRAMUSCULAR | Status: DC | PRN
  Administered 2011-07-28: 2 mg via INTRAVENOUS

## 2011-07-28 MED ORDER — MIDAZOLAM HCL 2 MG/2ML IJ SOLN
INTRAMUSCULAR | Status: AC
  Filled 2011-07-28: qty 2

## 2011-07-28 MED ORDER — NA CHONDROIT SULF-NA HYALURON 40-30 MG/ML IO SOLN
INTRAOCULAR | Status: DC | PRN
  Administered 2011-07-28: 0.75 mL via INTRAOCULAR

## 2011-07-28 MED ORDER — BSS IO SOLN
INTRAOCULAR | Status: DC | PRN
  Administered 2011-07-28: 500 mL via OPHTHALMIC

## 2011-07-28 MED ORDER — TETRACAINE HCL 0.5 % OP SOLN
OPHTHALMIC | Status: AC
  Administered 2011-07-28: 1 [drp] via OPHTHALMIC
  Filled 2011-07-28: qty 2

## 2011-07-28 MED ORDER — PHENYLEPHRINE HCL 2.5 % OP SOLN
OPHTHALMIC | Status: AC
  Administered 2011-07-28: 1 [drp] via OPHTHALMIC
  Filled 2011-07-28: qty 2

## 2011-07-28 MED ORDER — MIDAZOLAM HCL 5 MG/5ML IJ SOLN
INTRAMUSCULAR | Status: DC | PRN
Start: 2011-07-28 — End: 2011-07-28
  Administered 2011-07-28: 2 mg via INTRAVENOUS

## 2011-07-28 MED ORDER — TRYPAN BLUE 0.06 % OP SOLN
OPHTHALMIC | Status: DC | PRN
  Administered 2011-07-28: 0.5 mL via INTRAOCULAR

## 2011-07-28 MED ORDER — TRYPAN BLUE 0.06 % OP SOLN
OPHTHALMIC | Status: AC
  Filled 2011-07-28: qty 0.5

## 2011-07-28 MED ORDER — CYCLOPENTOLATE-PHENYLEPHRINE 0.2-1 % OP SOLN
OPHTHALMIC | Status: AC
  Administered 2011-07-28: 1 [drp] via OPHTHALMIC
  Filled 2011-07-28: qty 2

## 2011-07-28 MED ORDER — MIDAZOLAM HCL 2 MG/2ML IJ SOLN
INTRAMUSCULAR | Status: AC
  Administered 2011-07-28: 2 mg via INTRAVENOUS
  Filled 2011-07-28: qty 2

## 2011-07-28 MED ORDER — TETRACAINE HCL 0.5 % OP SOLN
1.0000 [drp] | OPHTHALMIC | Status: AC
  Administered 2011-07-28 (×3): 1 [drp] via OPHTHALMIC

## 2011-07-28 MED ORDER — LACTATED RINGERS IV SOLN
INTRAVENOUS | Status: DC
  Administered 2011-07-28: 07:00:00 via INTRAVENOUS

## 2011-07-28 MED ORDER — PHENYLEPHRINE HCL 2.5 % OP SOLN
1.0000 [drp] | OPHTHALMIC | Status: AC
  Administered 2011-07-28 (×3): 1 [drp] via OPHTHALMIC

## 2011-07-28 MED ORDER — CYCLOPENTOLATE-PHENYLEPHRINE 0.2-1 % OP SOLN
1.0000 [drp] | OPHTHALMIC | Status: AC
  Administered 2011-07-28 (×3): 1 [drp] via OPHTHALMIC

## 2011-07-28 MED ORDER — LIDOCAINE HCL (PF) 1 % IJ SOLN
INTRAMUSCULAR | Status: AC
  Filled 2011-07-28: qty 2

## 2011-07-28 MED ORDER — CARBACHOL 0.01 % IO SOLN
INTRAOCULAR | Status: AC
  Filled 2011-07-28: qty 1.5

## 2011-07-28 SURGICAL SUPPLY — 26 items
CAPSULAR TENSION RING-AMO (OPHTHALMIC RELATED) IMPLANT
CLOTH BEACON ORANGE TIMEOUT ST (SAFETY) ×2 IMPLANT
DUOVISC SYSTEM (INTRAOCULAR LENS)
EYE SHIELD UNIVERSAL CLEAR (GAUZE/BANDAGES/DRESSINGS) ×2 IMPLANT
GLOVE BIOGEL M 6.5 STRL (GLOVE) IMPLANT
GLOVE BIOGEL PI IND STRL 6.5 (GLOVE) ×1 IMPLANT
GLOVE BIOGEL PI INDICATOR 6.5 (GLOVE) ×1
GLOVE ECLIPSE 6.5 STRL STRAW (GLOVE) IMPLANT
GLOVE ECLIPSE 7.0 STRL STRAW (GLOVE) IMPLANT
GLOVE EXAM NITRILE LRG STRL (GLOVE) IMPLANT
GLOVE EXAM NITRILE MD LF STRL (GLOVE) ×2 IMPLANT
GLOVE SKINSENSE NS SZ6.5 (GLOVE)
GLOVE SKINSENSE STRL SZ6.5 (GLOVE) IMPLANT
GOWN BRE IMP SLV AUR XL STRL (GOWN DISPOSABLE) ×2 IMPLANT
HEALON 5 0.6 ML (INTRAOCULAR LENS) IMPLANT
KIT VITRECTOMY (OPHTHALMIC RELATED) IMPLANT
PAD ARMBOARD 7.5X6 YLW CONV (MISCELLANEOUS) ×2 IMPLANT
PROC W NO LENS (INTRAOCULAR LENS)
PROC W SPEC LENS (INTRAOCULAR LENS)
PROCESS W NO LENS (INTRAOCULAR LENS) IMPLANT
PROCESS W SPEC LENS (INTRAOCULAR LENS) IMPLANT
RING MALYGIN (MISCELLANEOUS) IMPLANT
SIGHTPATH CAT PROC W REG LENS (Ophthalmic Related) ×2 IMPLANT
SYSTEM DUOVISC (INTRAOCULAR LENS) IMPLANT
TAPE CLOTH 2X10 TAN LF (GAUZE/BANDAGES/DRESSINGS) ×2 IMPLANT
WATER STERILE IRR 250ML POUR (IV SOLUTION) ×2 IMPLANT

## 2011-07-28 NOTE — H&P (Signed)
No change in H and P 

## 2011-07-28 NOTE — Op Note (Signed)
Patient brought to the operating room and prepped and draped in the usual manner.  Lid speculum inserted in right eye.  Stab incision made at the twelve o'clock position.  Trypan Blue inserted in anterior chamber. Viscoat instilled in the anterior chamber.   A 2.4 mm. Stab incision was made temporally.  An anterior capsulotomy was done with a bent 25 gauge needle.  The nucleus was hydrodissected.  The Phaco tip was inserted in the anterior chamber and the nucleus was emulsified.  CDE was 20.26.  The cortical material was then removed with the I and A tip.  Posterior capsule was the polished.  The anterior chamber was deepened with Provisc.  A 19.5 Diopter Rayner 570C IOL was then inserted in the capsular bag.  Provisc was then removed with the I and A tip.  The wound was then hydrated.  Patient sent to the Recovery Room in good condition with follow up in my office.

## 2011-07-28 NOTE — Transfer of Care (Signed)
Immediate Anesthesia Transfer of Care Note  Patient: [name]  Procedure(s) Performed:  CATARACT EXTRACTION PHACO AND INTRAOCULAR LENS PLACEMENT (IOC) - CDE: 20.26  Patient Location: Shortstay  Anesthesia Type: MAC  Level of Consciousness: awake  Airway & Oxygen Therapy: Patient Spontanous Breathing   Post-op Assessment: Report given to PACU RN, Post -op Vital signs reviewed and stable and Patient moving all extremities  Post vital signs: Reviewed and stable  Complications: No apparent anesthesia complications

## 2011-07-28 NOTE — Anesthesia Postprocedure Evaluation (Signed)
  Anesthesia Post-op Note  Patient: [name]  Procedure(s) Performed:  CATARACT EXTRACTION PHACO AND INTRAOCULAR LENS PLACEMENT (IOC) - CDE: 20.26  Patient Location:  Short Stay  Anesthesia Type: MAC  Level of Consciousness: awake  Airway and Oxygen Therapy: Patient Spontanous Breathing  Post-op Pain: none  Post-op Assessment: Post-op Vital signs reviewed, Patient's Cardiovascular Status Stable, Respiratory Function Stable, Patent Airway, No signs of Nausea or vomiting and Pain level controlled  Post-op Vital Signs: Reviewed and stable  Complications: No apparent anesthesia complications

## 2011-07-28 NOTE — Addendum Note (Signed)
Addendum  created 07/28/11 1610 by Minerva Areola, CRNA   Modules edited:Charges VN

## 2011-07-28 NOTE — Anesthesia Procedure Notes (Signed)
Procedure Name: MAC Date/Time: 07/28/2011 7:42 AM Performed by: Minerva Areola Pre-anesthesia Checklist: Patient identified, Patient being monitored, Emergency Drugs available and Suction available Oxygen Delivery Method: Nasal Cannula

## 2011-07-28 NOTE — Anesthesia Preprocedure Evaluation (Signed)
Anesthesia Evaluation  Name, MR# and DOB Patient awake  General Assessment Comment  Reviewed: Allergy & Precautions, H&P , NPO status , Patient's Chart, lab work & pertinent test results  History of Anesthesia Complications Negative for: history of anesthetic complications  Airway Mallampati: II  Neck ROM: Full    Dental  (+) Edentulous Upper and Edentulous Lower   Pulmonary    pulmonary exam normalPulmonary Exam Normal     Cardiovascular hypertension, Pt. on medications Regular Normal    Neuro/Psych    (+) Anxiety,    GI/Hepatic/Renal (+)  GERD Medicated and Controlled     Endo/Other    Abdominal   Musculoskeletal   Hematology   Peds  Reproductive/Obstetrics    Anesthesia Other Findings             Anesthesia Physical Anesthesia Plan  ASA: III  Anesthesia Plan: MAC   Post-op Pain Management:    Induction: Intravenous  Airway Management Planned: Nasal Cannula  Additional Equipment:   Intra-op Plan:   Post-operative Plan:   Informed Consent: I have reviewed the patients History and Physical, chart, labs and discussed the procedure including the risks, benefits and alternatives for the proposed anesthesia with the patient or authorized representative who has indicated his/her understanding and acceptance.     Plan Discussed with:   Anesthesia Plan Comments:         Anesthesia Quick Evaluation

## 2011-08-03 ENCOUNTER — Encounter (HOSPITAL_COMMUNITY): Payer: Self-pay | Admitting: Ophthalmology

## 2011-09-02 ENCOUNTER — Encounter (HOSPITAL_COMMUNITY): Payer: Self-pay

## 2011-09-03 ENCOUNTER — Encounter (HOSPITAL_COMMUNITY)
Admission: RE | Admit: 2011-09-03 | Discharge: 2011-09-03 | Payer: Medicare Other | Source: Ambulatory Visit | Attending: Ophthalmology | Admitting: Ophthalmology

## 2011-09-08 ENCOUNTER — Ambulatory Visit (HOSPITAL_COMMUNITY): Payer: Medicare Other | Admitting: Anesthesiology

## 2011-09-08 ENCOUNTER — Encounter (HOSPITAL_COMMUNITY): Payer: Self-pay | Admitting: Anesthesiology

## 2011-09-08 ENCOUNTER — Ambulatory Visit (HOSPITAL_COMMUNITY)
Admission: RE | Admit: 2011-09-08 | Discharge: 2011-09-08 | Disposition: A | Discharge status: Home / Self Care | Payer: Medicare Other | Source: Ambulatory Visit | Attending: Ophthalmology | Admitting: Ophthalmology

## 2011-09-08 ENCOUNTER — Encounter (HOSPITAL_COMMUNITY)
Admission: RE | Disposition: A | Discharge status: Home / Self Care | Payer: Self-pay | Source: Ambulatory Visit | Attending: Ophthalmology

## 2011-09-08 DIAGNOSIS — Z79899 Other long term (current) drug therapy: Secondary | ICD-10-CM | POA: Insufficient documentation

## 2011-09-08 DIAGNOSIS — H2510 Age-related nuclear cataract, unspecified eye: Secondary | ICD-10-CM | POA: Insufficient documentation

## 2011-09-08 DIAGNOSIS — E785 Hyperlipidemia, unspecified: Secondary | ICD-10-CM | POA: Insufficient documentation

## 2011-09-08 DIAGNOSIS — I10 Essential (primary) hypertension: Secondary | ICD-10-CM | POA: Insufficient documentation

## 2011-09-08 HISTORY — PX: CATARACT EXTRACTION W/PHACO: SHX586

## 2011-09-08 SURGERY — PHACOEMULSIFICATION, CATARACT, WITH IOL INSERTION
Anesthesia: Monitor Anesthesia Care | Site: Eye | Laterality: Left | Wound class: Clean

## 2011-09-08 MED ORDER — BSS IO SOLN
INTRAOCULAR | Status: DC | PRN
  Administered 2011-09-08: 15 mL via OPHTHALMIC

## 2011-09-08 MED ORDER — CYCLOPENTOLATE-PHENYLEPHRINE 0.2-1 % OP SOLN
OPHTHALMIC | Status: AC
  Administered 2011-09-08: 1 [drp] via OPHTHALMIC
  Filled 2011-09-08: qty 2

## 2011-09-08 MED ORDER — EPINEPHRINE HCL 1 MG/ML IJ SOLN
INTRAOCULAR | Status: DC | PRN
  Administered 2011-09-08: 10:00:00

## 2011-09-08 MED ORDER — TETRACAINE 0.5 % OP SOLN OPTIME - NO CHARGE
OPHTHALMIC | Status: DC | PRN
  Administered 2011-09-08: 1 [drp] via OPHTHALMIC

## 2011-09-08 MED ORDER — MIDAZOLAM HCL 2 MG/2ML IJ SOLN
INTRAMUSCULAR | Status: AC
  Filled 2011-09-08: qty 2

## 2011-09-08 MED ORDER — PHENYLEPHRINE HCL 2.5 % OP SOLN
OPHTHALMIC | Status: AC
  Administered 2011-09-08: 1 [drp] via OPHTHALMIC
  Filled 2011-09-08: qty 2

## 2011-09-08 MED ORDER — LACTATED RINGERS IV SOLN
INTRAVENOUS | Status: DC | PRN
  Administered 2011-09-08: 09:00:00 via INTRAVENOUS

## 2011-09-08 MED ORDER — TETRACAINE HCL 0.5 % OP SOLN
OPHTHALMIC | Status: AC
  Administered 2011-09-08: 1 [drp] via OPHTHALMIC
  Filled 2011-09-08: qty 2

## 2011-09-08 MED ORDER — EPINEPHRINE HCL 1 MG/ML IJ SOLN
INTRAMUSCULAR | Status: AC
  Filled 2011-09-08: qty 1

## 2011-09-08 MED ORDER — FLURBIPROFEN SODIUM 0.03 % OP SOLN
1.0000 [drp] | OPHTHALMIC | Status: AC
  Administered 2011-09-08 (×3): 1 [drp] via OPHTHALMIC

## 2011-09-08 MED ORDER — LACTATED RINGERS IV SOLN: INTRAVENOUS | Status: DC

## 2011-09-08 MED ORDER — FLURBIPROFEN SODIUM 0.03 % OP SOLN
OPHTHALMIC | Status: AC
  Administered 2011-09-08: 1 [drp] via OPHTHALMIC
  Filled 2011-09-08: qty 2.5

## 2011-09-08 MED ORDER — KETOROLAC TROMETHAMINE 0.5 % OP SOLN: 1.0000 [drp] | OPHTHALMIC | Status: DC

## 2011-09-08 MED ORDER — PROVISC 10 MG/ML IO SOLN
INTRAOCULAR | Status: DC | PRN
  Administered 2011-09-08: 8.5 mg via OPHTHALMIC

## 2011-09-08 MED ORDER — CYCLOPENTOLATE-PHENYLEPHRINE 0.2-1 % OP SOLN
1.0000 [drp] | OPHTHALMIC | Status: AC
  Administered 2011-09-08 (×3): 1 [drp] via OPHTHALMIC

## 2011-09-08 MED ORDER — TETRACAINE HCL 0.5 % OP SOLN
1.0000 [drp] | OPHTHALMIC | Status: AC
  Administered 2011-09-08 (×3): 1 [drp] via OPHTHALMIC

## 2011-09-08 MED ORDER — MIDAZOLAM HCL 2 MG/2ML IJ SOLN
1.0000 mg | INTRAMUSCULAR | Status: DC | PRN
Start: 2011-09-08 — End: 2011-09-08
  Administered 2011-09-08: 2 mg via INTRAVENOUS

## 2011-09-08 MED ORDER — PHENYLEPHRINE HCL 2.5 % OP SOLN
1.0000 [drp] | OPHTHALMIC | Status: AC
  Administered 2011-09-08 (×3): 1 [drp] via OPHTHALMIC

## 2011-09-08 SURGICAL SUPPLY — 25 items
CAPSULAR TENSION RING-AMO (OPHTHALMIC RELATED) IMPLANT
CLOTH BEACON ORANGE TIMEOUT ST (SAFETY) ×2 IMPLANT
DUOVISC SYSTEM (INTRAOCULAR LENS)
EYE SHIELD UNIVERSAL CLEAR (GAUZE/BANDAGES/DRESSINGS) ×2 IMPLANT
GLOVE BIOGEL M 6.5 STRL (GLOVE) ×2 IMPLANT
GLOVE ECLIPSE 6.5 STRL STRAW (GLOVE) IMPLANT
GLOVE ECLIPSE 7.0 STRL STRAW (GLOVE) IMPLANT
GLOVE EXAM NITRILE LRG STRL (GLOVE) ×2 IMPLANT
GLOVE EXAM NITRILE MD LF STRL (GLOVE) IMPLANT
GLOVE SKINSENSE NS SZ6.5 (GLOVE)
GLOVE SKINSENSE STRL SZ6.5 (GLOVE) IMPLANT
HEALON 5 0.6 ML (INTRAOCULAR LENS) IMPLANT
KIT VITRECTOMY (OPHTHALMIC RELATED) IMPLANT
PAD ARMBOARD 7.5X6 YLW CONV (MISCELLANEOUS) ×2 IMPLANT
PROC W NO LENS (INTRAOCULAR LENS)
PROC W SPEC LENS (INTRAOCULAR LENS)
PROCESS W NO LENS (INTRAOCULAR LENS) IMPLANT
PROCESS W SPEC LENS (INTRAOCULAR LENS) IMPLANT
RING MALYGIN (MISCELLANEOUS) ×2 IMPLANT
SIGHTPATH CAT PROC W REG LENS (Ophthalmic Related) ×2 IMPLANT
SYSTEM DUOVISC (INTRAOCULAR LENS) IMPLANT
TAPE SURG TRANSPORE 1 IN (GAUZE/BANDAGES/DRESSINGS) ×1 IMPLANT
TAPE SURGICAL TRANSPORE 1 IN (GAUZE/BANDAGES/DRESSINGS) ×1
VISCOELASTIC ADDITIONAL (OPHTHALMIC RELATED) IMPLANT
WATER STERILE IRR 250ML POUR (IV SOLUTION) ×2 IMPLANT

## 2011-09-08 NOTE — Anesthesia Postprocedure Evaluation (Signed)
  Anesthesia Post-op Note  Patient: [name]  Procedure(s) Performed:  CATARACT EXTRACTION PHACO AND INTRAOCULAR LENS PLACEMENT (IOC) - CDE:37.31  Patient Location: PACU  Anesthesia Type: MAC  Level of Consciousness: awake  Airway and Oxygen Therapy: Patient Spontanous Breathing  Post-op Pain: none  Post-op Assessment: Post-op Vital signs reviewed, Patient's Cardiovascular Status Stable and Respiratory Function Stable  Post-op Vital Signs: Reviewed and stable  Complications: No apparent anesthesia complications

## 2011-09-08 NOTE — H&P (Signed)
No change in H and P 

## 2011-09-08 NOTE — Transfer of Care (Signed)
Immediate Anesthesia Transfer of Care Note  Patient: [name]  Procedure(s) Performed:  CATARACT EXTRACTION PHACO AND INTRAOCULAR LENS PLACEMENT (IOC) - CDE:37.31  Patient Location: PACU and Short Stay  Anesthesia Type: MAC  Level of Consciousness: awake, alert  and oriented  Airway & Oxygen Therapy: Patient Spontanous Breathing  Post-op Assessment: Report given to PACU RN  Post vital signs: Reviewed and stable  Complications: No apparent anesthesia complications

## 2011-09-08 NOTE — Anesthesia Preprocedure Evaluation (Addendum)
Anesthesia Evaluation  Name, MR# and DOB Patient awake  General Assessment Comment  Reviewed: Allergy & Precautions, H&P , NPO status , Patient's Chart, lab work & pertinent test results  History of Anesthesia Complications Negative for: history of anesthetic complications  Airway Mallampati: II  Neck ROM: Full    Dental  (+) Edentulous Upper and Edentulous Lower   Pulmonary    pulmonary exam normalPulmonary Exam Normal     Cardiovascular hypertension, Pt. on medications Regular Normal    Neuro/Psych    (+) Anxiety,    GI/Hepatic/Renal (+)  GERD Medicated and Controlled     Endo/Other    Abdominal   Musculoskeletal   Hematology   Peds  Reproductive/Obstetrics    Anesthesia Other Findings             Anesthesia Physical Anesthesia Plan  ASA: III  Anesthesia Plan: MAC   Post-op Pain Management:    Induction: Intravenous  Airway Management Planned: Nasal Cannula  Additional Equipment:   Intra-op Plan:   Post-operative Plan:   Informed Consent: I have reviewed the patients History and Physical, chart, labs and discussed the procedure including the risks, benefits and alternatives for the proposed anesthesia with the patient or authorized representative who has indicated his/her understanding and acceptance.     Plan Discussed with:   Anesthesia Plan Comments:         Anesthesia Quick Evaluation  

## 2011-09-08 NOTE — Op Note (Signed)
Patient brought to the operating room and prepped and draped in the usual manner.  Lid speculum inserted in right eye.  Stab incision made at the twelve o'clock position.  Provisc instilled in the anterior chamber.   A 2.4 mm. Stab incision was made temporally. Due to a small pupil, a Malugyn ring was inserted. An anterior capsulotomy was done with a bent 25 gauge needle.  The nucleus was hydrodissected.  The Phaco tip was inserted in the anterior chamber and the nucleus was emulsified.  CDE was 37.31.  The cortical material was then removed with the I and A tip.  Posterior capsule was the polished.  The anterior chamber was deepened with Provisc.  A 19.5 Diopter Rayner 570C IOL was then inserted in the capsular bag. The Malugyn ring was removed without complications.  Provisc was then removed with the I and A tip.  The wound was then hydrated.  Patient sent to the Recovery Room in good condition with follow up in my office.

## 2011-09-10 ENCOUNTER — Encounter (HOSPITAL_COMMUNITY): Payer: Self-pay | Admitting: Ophthalmology

## 2012-11-08 ENCOUNTER — Encounter (HOSPITAL_COMMUNITY): Payer: Self-pay | Admitting: Pharmacy Technician

## 2012-11-22 ENCOUNTER — Encounter (HOSPITAL_COMMUNITY)
Admission: RE | Disposition: A | Discharge status: Home / Self Care | Payer: Self-pay | Source: Ambulatory Visit | Attending: Ophthalmology

## 2012-11-22 ENCOUNTER — Ambulatory Visit (HOSPITAL_COMMUNITY)
Admission: RE | Admit: 2012-11-22 | Discharge: 2012-11-22 | Disposition: A | Discharge status: Home / Self Care | Payer: Medicare Other | Source: Ambulatory Visit | Attending: Ophthalmology | Admitting: Ophthalmology

## 2012-11-22 ENCOUNTER — Encounter (HOSPITAL_COMMUNITY): Payer: Self-pay | Admitting: *Deleted

## 2012-11-22 DIAGNOSIS — H547 Unspecified visual loss: Secondary | ICD-10-CM | POA: Insufficient documentation

## 2012-11-22 HISTORY — PX: YAG LASER APPLICATION: SHX6189

## 2012-11-22 SURGERY — TREATMENT, USING YAG LASER
Anesthesia: LOCAL | Laterality: Right

## 2012-11-22 MED ORDER — TROPICAMIDE 1 % OP SOLN
OPHTHALMIC | Status: AC
  Filled 2012-11-22: qty 3

## 2012-11-22 MED ORDER — TROPICAMIDE 1 % OP SOLN
1.0000 [drp] | OPHTHALMIC | Status: AC
  Administered 2012-11-22 (×2): 1 [drp] via OPHTHALMIC

## 2012-11-22 NOTE — Brief Op Note (Signed)
[name] 11/22/2012  Delrose Rohwer T. Nile Riggs, MD  Yag Laser Self Test Completedyes. Procedure: Posterior Capsulotomy, right eye.  Eye Protection Worn by Staff yes. Laser In Use Sign on Door yes.  Laser: Nd:YAG Spot Size: Fixed Burst Mode: III Power Setting: 3.7 mJ/burst  Number of shots: 23 Total energy delivered: 82.2 mJ  Patency of the peripheral iridotomy was confirmed visually.  The patient tolerated the procedure without difficulty. No complications were encountered.    The patient was discharged home with the instructions to continue all his current glaucoma medications, if any.   Patient instructed to go to office at 0100 for intraocular pressure check.  Patient verbalizes understanding of discharge instructions yes.

## 2012-11-22 NOTE — H&P (Signed)
The patient was re examined and there is no change in the patients condition since the original H and P. 

## 2012-11-23 ENCOUNTER — Encounter (HOSPITAL_COMMUNITY): Payer: Self-pay | Admitting: Ophthalmology

## 2012-12-23 MED ORDER — APRACLONIDINE HCL 1 % OP SOLN: 1.0000 [drp] | OPHTHALMIC | Status: AC

## 2014-02-09 ENCOUNTER — Ambulatory Visit: Payer: Medicare Other | Admitting: Urology

## 2014-03-23 ENCOUNTER — Ambulatory Visit (INDEPENDENT_AMBULATORY_CARE_PROVIDER_SITE_OTHER): Payer: Medicare Other | Admitting: Urology

## 2014-03-23 DIAGNOSIS — N393 Stress incontinence (female) (male): Secondary | ICD-10-CM

## 2014-03-23 DIAGNOSIS — Z8546 Personal history of malignant neoplasm of prostate: Secondary | ICD-10-CM

## 2014-03-23 DIAGNOSIS — N3941 Urge incontinence: Secondary | ICD-10-CM

## 2014-06-01 ENCOUNTER — Ambulatory Visit (INDEPENDENT_AMBULATORY_CARE_PROVIDER_SITE_OTHER): Payer: Medicare Other | Admitting: Urology

## 2014-06-01 DIAGNOSIS — N393 Stress incontinence (female) (male): Secondary | ICD-10-CM

## 2014-06-01 DIAGNOSIS — R351 Nocturia: Secondary | ICD-10-CM

## 2014-10-25 ENCOUNTER — Ambulatory Visit: Payer: Medicare Other | Admitting: Orthopedic Surgery

## 2014-11-30 ENCOUNTER — Ambulatory Visit: Payer: Medicare Other | Admitting: Urology

## 2015-02-21 DIAGNOSIS — D500 Iron deficiency anemia secondary to blood loss (chronic): Secondary | ICD-10-CM | POA: Diagnosis not present

## 2015-02-21 DIAGNOSIS — E039 Hypothyroidism, unspecified: Secondary | ICD-10-CM | POA: Diagnosis not present

## 2015-02-21 DIAGNOSIS — M25552 Pain in left hip: Secondary | ICD-10-CM | POA: Diagnosis not present

## 2015-02-21 DIAGNOSIS — E782 Mixed hyperlipidemia: Secondary | ICD-10-CM | POA: Diagnosis not present

## 2015-02-22 ENCOUNTER — Other Ambulatory Visit (HOSPITAL_COMMUNITY): Payer: Self-pay | Admitting: Internal Medicine

## 2015-02-22 ENCOUNTER — Ambulatory Visit (HOSPITAL_COMMUNITY)
Admission: RE | Admit: 2015-02-22 | Discharge: 2015-02-22 | Disposition: A | Discharge status: Home / Self Care | Payer: Medicare Other | Source: Ambulatory Visit | Attending: Internal Medicine | Admitting: Internal Medicine

## 2015-02-22 DIAGNOSIS — M79605 Pain in left leg: Secondary | ICD-10-CM

## 2015-02-22 DIAGNOSIS — M545 Low back pain, unspecified: Secondary | ICD-10-CM

## 2015-02-22 DIAGNOSIS — S8992XA Unspecified injury of left lower leg, initial encounter: Secondary | ICD-10-CM | POA: Diagnosis not present

## 2015-02-22 DIAGNOSIS — R600 Localized edema: Secondary | ICD-10-CM | POA: Diagnosis not present

## 2015-03-04 DIAGNOSIS — R270 Ataxia, unspecified: Secondary | ICD-10-CM | POA: Diagnosis not present

## 2015-03-04 DIAGNOSIS — G20 Parkinson's disease: Secondary | ICD-10-CM | POA: Diagnosis not present

## 2015-03-04 DIAGNOSIS — M533 Sacrococcygeal disorders, not elsewhere classified: Secondary | ICD-10-CM | POA: Diagnosis not present

## 2015-03-04 DIAGNOSIS — Z79899 Other long term (current) drug therapy: Secondary | ICD-10-CM | POA: Diagnosis not present

## 2015-03-26 ENCOUNTER — Other Ambulatory Visit (HOSPITAL_COMMUNITY): Payer: Self-pay | Admitting: Internal Medicine

## 2015-03-26 DIAGNOSIS — M25552 Pain in left hip: Secondary | ICD-10-CM

## 2015-04-01 ENCOUNTER — Ambulatory Visit (HOSPITAL_COMMUNITY): Admission: RE | Admit: 2015-04-01 | Payer: Medicare Other | Source: Ambulatory Visit

## 2015-06-07 ENCOUNTER — Emergency Department (HOSPITAL_COMMUNITY)
Admission: EM | Admit: 2015-06-07 | Discharge: 2015-06-08 | Disposition: A | Discharge status: Home / Self Care | Payer: Medicare Other | Attending: Emergency Medicine | Admitting: Emergency Medicine

## 2015-06-07 ENCOUNTER — Encounter (HOSPITAL_COMMUNITY): Payer: Self-pay | Admitting: Emergency Medicine

## 2015-06-07 ENCOUNTER — Emergency Department (HOSPITAL_COMMUNITY): Payer: Medicare Other

## 2015-06-07 DIAGNOSIS — X58XXXA Exposure to other specified factors, initial encounter: Secondary | ICD-10-CM | POA: Diagnosis not present

## 2015-06-07 DIAGNOSIS — K219 Gastro-esophageal reflux disease without esophagitis: Secondary | ICD-10-CM | POA: Diagnosis not present

## 2015-06-07 DIAGNOSIS — Y9389 Activity, other specified: Secondary | ICD-10-CM | POA: Diagnosis not present

## 2015-06-07 DIAGNOSIS — Z79899 Other long term (current) drug therapy: Secondary | ICD-10-CM | POA: Diagnosis not present

## 2015-06-07 DIAGNOSIS — S46812A Strain of other muscles, fascia and tendons at shoulder and upper arm level, left arm, initial encounter: Secondary | ICD-10-CM

## 2015-06-07 DIAGNOSIS — Y9289 Other specified places as the place of occurrence of the external cause: Secondary | ICD-10-CM | POA: Insufficient documentation

## 2015-06-07 DIAGNOSIS — S46912A Strain of unspecified muscle, fascia and tendon at shoulder and upper arm level, left arm, initial encounter: Secondary | ICD-10-CM | POA: Diagnosis not present

## 2015-06-07 DIAGNOSIS — F419 Anxiety disorder, unspecified: Secondary | ICD-10-CM | POA: Diagnosis not present

## 2015-06-07 DIAGNOSIS — E785 Hyperlipidemia, unspecified: Secondary | ICD-10-CM | POA: Insufficient documentation

## 2015-06-07 DIAGNOSIS — M542 Cervicalgia: Secondary | ICD-10-CM

## 2015-06-07 DIAGNOSIS — Y998 Other external cause status: Secondary | ICD-10-CM | POA: Insufficient documentation

## 2015-06-07 DIAGNOSIS — S199XXA Unspecified injury of neck, initial encounter: Secondary | ICD-10-CM | POA: Diagnosis present

## 2015-06-07 DIAGNOSIS — I10 Essential (primary) hypertension: Secondary | ICD-10-CM | POA: Insufficient documentation

## 2015-06-07 DIAGNOSIS — Z791 Long term (current) use of non-steroidal anti-inflammatories (NSAID): Secondary | ICD-10-CM | POA: Insufficient documentation

## 2015-06-07 MED ORDER — METHOCARBAMOL 500 MG PO TABS
500.0000 mg | ORAL_TABLET | Freq: Once | ORAL | Status: AC
  Administered 2015-06-07: 500 mg via ORAL
  Filled 2015-06-07: qty 1

## 2015-06-07 MED ORDER — HYDROCODONE-ACETAMINOPHEN 5-325 MG PO TABS: 2.0000 | ORAL_TABLET | Freq: Once | ORAL | Status: DC

## 2015-06-07 MED ORDER — BACLOFEN 10 MG PO TABS: 10.0000 mg | ORAL_TABLET | Freq: Two times a day (BID) | ORAL | Status: AC

## 2015-06-07 MED ORDER — ACETAMINOPHEN 500 MG PO TABS
1000.0000 mg | ORAL_TABLET | Freq: Once | ORAL | Status: AC
  Administered 2015-06-07: 1000 mg via ORAL
  Filled 2015-06-07: qty 2

## 2015-06-07 NOTE — Discharge Instructions (Signed)
A heating pad to your neck and shoulder may be helpful. Please use Tylenol Extra Strength every 4 hours for pain. Please use Baclofen  2 or 3 times daily if needed for spasm pain. Robaxin may cause drowsiness, please use caution getting around when taking this medication. Please see your physician for recheck of this issue.

## 2015-06-07 NOTE — ED Notes (Signed)
Patient complaining of neck pain for approximately a couple weeks. Reports he was trying to get a recliner into the house a couple weeks ago and the recliner fell.

## 2015-06-07 NOTE — ED Provider Notes (Signed)
CSN: [id number]     Arrival date & time 06/07/15  1942 History   First MD Initiated Contact with Patient 06/07/15 2016     Chief Complaint  Patient presents with  . Neck Pain     (Consider location/radiation/quality/duration/timing/severity/associated sxs/prior Treatment) HPI Comments: Patient is a 74 year old male who presents to the emergency department with a complaint of neck pain, left more than right. The patient states that a couple weeks ago he was helping to carry a recliner into his home when the recliner fell and he injured his neck and shoulder. The pain has been getting progressively worse since that time. The patient is particular concern because he has a history of Parkinson's disease. The patient states he spoke with Dr.[place], and was requested to come to the emergency department to be evaluated and to get x-rays. The patient states that his doctor would like to receive a call concerning the results and findings. The patient denies being on any anticoagulation medications. There is no history of any bleeding disorders. There is no history of any previous operations or procedures involving the neck and shoulder area.  Patient is a 74 y.o. male presenting with neck pain. The history is provided by the patient and a relative.  Neck Pain   Past Medical History  Diagnosis Date  . Hyperlipidemia   . Hypertension   . GERD (gastroesophageal reflux disease)   . Anxiety    Past Surgical History  Procedure Laterality Date  . Prostatectomy    . Bladder repair      pt sts "when I had my prostatectomy they cut too much and I have an internal button I have to press in order to release my urine"  . Cataract extraction w/phaco  07/28/2011    Procedure: CATARACT EXTRACTION PHACO AND INTRAOCULAR LENS PLACEMENT (IOC);  Surgeon: Elta Guadeloupe T. Gershon Crane;  Location: [facility];  Service: Ophthalmology;  Laterality: Right;  CDE: 20.26  . Cataract extraction w/phaco  09/08/2011    Procedure: CATARACT  EXTRACTION PHACO AND INTRAOCULAR LENS PLACEMENT (IOC);  Surgeon: Elta Guadeloupe T. Gershon Crane;  Location: [facility];  Service: Ophthalmology;  Laterality: Left;  CDE:37.31  . Yag laser application  71/24/5809    Procedure: YAG LASER APPLICATION;  Surgeon: Elta Guadeloupe T. Gershon Crane, MD;  Location: [facility];  Service: Ophthalmology;  Laterality: Right;   Family History  Problem Relation Age of Onset  . Anesthesia problems Neg Hx   . Hypotension Neg Hx   . Malignant hyperthermia Neg Hx   . Pseudochol deficiency Neg Hx    History  Substance Use Topics  . Smoking status: Former Smoker -- 0.50 packs/day for 10 years    Types: Cigarettes    Quit date: 07/24/1983  . Smokeless tobacco: Not on file  . Alcohol Use: No    Review of Systems  Musculoskeletal: Positive for arthralgias and neck pain.  Neurological: Positive for tremors.  Psychiatric/Behavioral: The patient is nervous/anxious.   All other systems reviewed and are negative.     Allergies  Ciprofloxacin  Home Medications   Prior to Admission medications   Medication Sig Start Date End Date Taking? Authorizing Provider  ALPRAZolam Duanne Moron) 1 MG tablet Take 0.5 mg by mouth daily as needed. For anxiety    Historical Provider, MD  carbidopa-levodopa (SINEMET) 25-100 MG per tablet Take 1 tablet by mouth 3 (three) times daily.     Historical Provider, MD  Chlorpheniramine Maleate (CHLOR-TABLETS PO) Take 1 tablet by mouth 2 (two) times daily.  Historical Provider, MD  citalopram (CELEXA) 40 MG tablet Take 40 mg by mouth daily.      Historical Provider, MD  diclofenac (VOLTAREN) 75 MG EC tablet Take 75 mg by mouth daily.    Historical Provider, MD  folic acid (FOLVITE) 1 MG tablet Take 1 mg by mouth daily.      Historical Provider, MD  lisinopril (PRINIVIL,ZESTRIL) 10 MG tablet Take 10 mg by mouth daily.    Historical Provider, MD  Multiple Vitamins-Minerals (MULTIVITAMIN WITH MINERALS) tablet Take 1 tablet by mouth daily.      Historical Provider, MD   rasagiline (AZILECT) 0.5 MG TABS Take 0.5 mg by mouth daily.    Historical Provider, MD  simvastatin (ZOCOR) 10 MG tablet Take 10 mg by mouth at bedtime.      Historical Provider, MD   BP 150/107 mmHg  Pulse 82  Temp(Src) 98.8 F (37.1 C) (Oral)  Resp 18  Ht 5' 8.5" (1.74 m)  Wt 212 lb (96.163 kg)  BMI 31.76 kg/m2  SpO2 100% Physical Exam  Constitutional: He is oriented to person, place, and time. He appears well-developed and well-nourished.  Non-toxic appearance.  HENT:  Head: Normocephalic.  Right Ear: Tympanic membrane and external ear normal.  Left Ear: Tympanic membrane and external ear normal.  Eyes: EOM and lids are normal. Pupils are equal, round, and reactive to light.  Neck: Normal range of motion. Neck supple. Carotid bruit is not present.  No carotid bruits appreciated.  Cardiovascular: Normal rate, regular rhythm, normal heart sounds, intact distal pulses and normal pulses.   Pulmonary/Chest: Breath sounds normal. No respiratory distress.  Abdominal: Soft. Bowel sounds are normal. There is no tenderness. There is no guarding.  Musculoskeletal: Normal range of motion.  Lymphadenopathy:       Head (right side): No submandibular adenopathy present.       Head (left side): No submandibular adenopathy present.    He has no cervical adenopathy.  Neurological: He is alert and oriented to person, place, and time. He has normal strength. No cranial nerve deficit or sensory deficit.  There is no weakness and shoulder shrug. The grip is symmetrical. There is no motor deficit involving the upper extremity. Gait is slow but steady. No evidence of motor or sensory deficit involving the lower extremity.  There is no palpable step off of the cervical spine area. There is some left greater than right paraspinal area tenderness present. No hot areas appreciated.  Skin: Skin is warm and dry.  Psychiatric: He has a normal mood and affect. His speech is normal.  Nursing note and vitals  reviewed.   ED Course  Radiology informed us there was a delay in getting pt to the CT scanner. Discussed delay with the patient and family.  Procedures (including critical care time) Labs Review Labs Reviewed - No data to display  Imaging Review No results found.   EKG Interpretation None      MDM  Blood pressure is elevated at 150/107, otherwise vital signs are well within normal limits. Pulse oximetry is 100% on room air. Within normal limits by my interpretation.  CT scan of the cervical spine reveals no acute fracture. There is a grade 1 C4-C5 and C5-C6 anterolisthesis due to degenerative joint disease changes. There are degenerative cervical spine findings there are moderate to severe at C3 C4-C4 C4-5 and C5-C6 area with foraminal narrowing. There is no fracture, no dislocation reported. There is no mass or tumor noted.  Suspect the  patient has a trapezius strain following attempting to carry the piece of furniture into his home. Patient will be treated with Tylenol and baclofen. Patient is asked to use a heating pad to the area. He will see his primary physician, and or Dr. Merlene Laughter for additional evaluation and management.    Final diagnoses:  Neck pain    **I have reviewed nursing notes, vital signs, and all appropriate lab and imaging results for this patient.Lily Kocher, PA-C 06/08/15 1107  Milton Ferguson, MD 06/10/15 [phone]

## 2016-02-19 ENCOUNTER — Ambulatory Visit (HOSPITAL_COMMUNITY): Payer: Medicare Other | Attending: Neurology

## 2016-02-19 ENCOUNTER — Encounter (HOSPITAL_COMMUNITY): Payer: Self-pay

## 2016-02-19 DIAGNOSIS — M25652 Stiffness of left hip, not elsewhere classified: Secondary | ICD-10-CM | POA: Insufficient documentation

## 2016-02-19 DIAGNOSIS — R2681 Unsteadiness on feet: Secondary | ICD-10-CM

## 2016-02-19 DIAGNOSIS — G20 Parkinson's disease: Secondary | ICD-10-CM | POA: Diagnosis present

## 2016-02-19 DIAGNOSIS — R279 Unspecified lack of coordination: Secondary | ICD-10-CM | POA: Insufficient documentation

## 2016-02-19 DIAGNOSIS — M5442 Lumbago with sciatica, left side: Secondary | ICD-10-CM | POA: Insufficient documentation

## 2016-02-19 DIAGNOSIS — R29898 Other symptoms and signs involving the musculoskeletal system: Secondary | ICD-10-CM | POA: Diagnosis present

## 2016-02-20 NOTE — Therapy (Signed)
[facility] [address] [place], [place], [address] Phone: [phone]   Fax:  [phone]  Physical Therapy Evaluation  Patient Details  Name: [name] MRN: [id number] Date of Birth: 1941/01/27 Referring Provider: Dr. Phillips Odor  Encounter Date: 02/19/2016      PT End of Session - 02/19/16 2032    Visit Number 1   Number of Visits 9   Date for PT Re-Evaluation 03/20/16   Authorization Type [employer] Medicare    Authorization Time Period 02/19/2016 to 04/15/2016    PT Start Time 1435   PT Stop Time 1520   PT Time Calculation (min) 45 min   Equipment Utilized During Treatment Gait belt   Activity Tolerance Patient tolerated treatment well   Behavior During Therapy [facility] for tasks assessed/performed      Past Medical History  Diagnosis Date  . Hyperlipidemia   . Hypertension   . GERD (gastroesophageal reflux disease)   . Anxiety     Past Surgical History  Procedure Laterality Date  . Prostatectomy    . Bladder repair      pt sts "when I had my prostatectomy they cut too much and I have an internal button I have to press in order to release my urine"  . Cataract extraction w/phaco  07/28/2011    Procedure: CATARACT EXTRACTION PHACO AND INTRAOCULAR LENS PLACEMENT (IOC);  Surgeon: Elta Guadeloupe T. Gershon Crane;  Location: [facility];  Service: Ophthalmology;  Laterality: Right;  CDE: 20.26  . Cataract extraction w/phaco  09/08/2011    Procedure: CATARACT EXTRACTION PHACO AND INTRAOCULAR LENS PLACEMENT (IOC);  Surgeon: Elta Guadeloupe T. Gershon Crane;  Location: [facility];  Service: Ophthalmology;  Laterality: Left;  CDE:37.31  . Yag laser application  [id number]    Procedure: YAG LASER APPLICATION;  Surgeon: Elta Guadeloupe T. Gershon Crane, MD;  Location: [facility];  Service: Ophthalmology;  Laterality: Right;    There were no vitals filed for this visit.  Visit Diagnosis:  Parkinson's disease ([place]) - Plan: PT plan of care cert/re-cert  Gait instability - Plan: PT plan of care  cert/re-cert  Weakness of both lower extremities - Plan: PT plan of care cert/re-cert  Stiffness of hip joint, left - Plan: PT plan of care cert/re-cert  Lack of coordination - Plan: PT plan of care cert/re-cert  Left-sided low back pain with left-sided sciatica - Plan: PT plan of care cert/re-cert      Subjective Assessment - 02/19/16 1449    Subjective [name] is a 75 yo male who currently c/o LBP with radicular L LE pain and unsteady gait. Pt reports long hx of PD and has noticed that his balance and walking has worsened within the last year. Pt reported a few falls within the last 6 months with no injuries sustained. Pt reported that he will have to limit his PT frequency due to high co-pay.     Pertinent History PD   Limitations Walking;Standing   How long can you sit comfortably? Unlimited    How long can you stand comfortably? 10 minutes   How long can you walk comfortably? 3 minutes    Diagnostic tests N/A    Patient Stated Goals Pt's goal is to reduce his pain and improve his balance.    Currently in Pain? Yes   Pain Score 4    Pain Location Back   Pain Orientation Lower   Pain Descriptors / Indicators Aching;Sharp   Pain Type Chronic pain   Pain Radiating Towards into the  L LE from hip to feet    Pain Onset More than a month ago   Aggravating Factors  walking and standing    Pain Relieving Factors Sitting and lying down    Effect of Pain on Daily Activities limiting his ability to ambulate Henry distances    Multiple Pain Sites No            [facility] PT Assessment - 02/19/16 0001    Assessment   Medical Diagnosis Parkinson's Disease with gait instability    Referring Provider Dr. Phillips Odor   Onset Date/Surgical Date 02/04/16   Hand Dominance Right   Next MD Visit Unknown    Prior Therapy Yes, for LBP    Precautions   Precautions Fall   Restrictions   Weight Bearing Restrictions No   Balance Screen   Has the patient fallen in the past 6 months  Yes   How many times? 3   Has the patient had a decrease in activity level because of a fear of falling?  Yes   Is the patient reluctant to leave their home because of a fear of falling?  Yes   [place] Private residence   Living Arrangements Spouse/significant other;Children   Available Help at Discharge Family   Type of [place] entrance   [place] bars - tub/shower   Additional Comments Pt would benefit from a SPC to improve balance and endurance with ambulation and standing activities.    Prior Function   Level of Independence Independent with basic ADLs;Needs assistance with homemaking   Leisure Yard work   Cognition   Overall Cognitive Status Impaired/Different from baseline   Area of Impairment Orientation;Safety/judgement   Orientation Level Time   General Comments Patient is easily distracted and requires cues to answer questions appropriately.    Observation/Other Assessments   Focus on Therapeutic Outcomes (FOTO)  Not compelted this visit    Coordination   Finger Nose Finger Test hypermetria with L finger to nose    Heel Shin Test WNL   Posture/Postural Control   Posture/Postural Control Postural limitations   Postural Limitations Rounded Shoulders;Forward head;Flexed trunk   Tone   Assessment Location Left Upper Extremity;Left Lower Extremity   ROM / Strength   AROM / PROM / Strength Strength;PROM   PROM   Overall PROM Comments R hip PROM was [facility] with muscle tightness assessed. Limitations assessed with L hip PROM into flexion ( 95 degrees) and IR/ER ( 20 degrees)     Strength   Strength Assessment Site Hip;Knee;Ankle   Right/Left Hip Right;Left   Right Hip Flexion 4+/5   Right Hip Extension 3+/5   Right Hip ABduction 4/5   Right Hip ADduction 4/5   Left Hip Flexion 4-/5   Left Hip Extension 3-/5   Left Hip ABduction 3-/5   Left Hip ADduction 3/5   Right/Left Knee  Right;Left   Right Knee Flexion 5/5   Right Knee Extension 5/5   Left Knee Flexion 3+/5   Left Knee Extension 3+/5   Right/Left Ankle Right;Left   Right Ankle Dorsiflexion 5/5   Right Ankle Plantar Flexion 4/5   Left Ankle Dorsiflexion 4/5   Left Ankle Plantar Flexion 4-/5   Flexibility   Soft Tissue Assessment /Muscle Length yes   Hamstrings Tightness bilaterally    Quadriceps Tightness bilaterally    ITB Tightness bilaterally    Piriformis Tightness  bilaterally    Bed Mobility   Bed Mobility Rolling Right;Rolling Left;Supine to Sit;Sit to Supine   Rolling Right 6: Modified independent (Device/Increase time)   Rolling Left 6: Modified independent (Device/Increase time)   Supine to Sit 6: Modified independent (Device/Increase time)   Sit to Supine 6: Modified independent (Device/Increase time)   Transfers   Transfers Sit to Stand;Stand to Sit   Sit to Stand 6: Modified independent (Device/Increase time);With upper extremity assist   Five time sit to stand comments  15 sec    Stand to Sit 6: Modified independent (Device/Increase time);With upper extremity assist   Ambulation/Gait   Ambulation/Gait Yes   Ambulation/Gait Assistance 5: Supervision;4: Min guard   Ambulation Distance (Feet) 250 Feet   Assistive device None   Gait Pattern Decreased arm swing - right;Decreased arm swing - left;Decreased step length - right;Decreased step length - left;Step-through pattern;Shuffle;Trunk flexed;Decreased trunk rotation   Ambulation Surface Level   Stairs --  Not assessed this visit    Standardized Balance Assessment   Standardized Balance Assessment Berg Balance Test   Berg Balance Test   Sit to Stand Able to stand  independently using hands   Standing Unsupported Able to stand 2 minutes with supervision   Sitting with Back Unsupported but Feet Supported on Floor or Stool Able to sit safely and securely 2 minutes   Stand to Sit Sits safely with minimal use of hands   Transfers Able  to transfer safely, definite need of hands   Standing Unsupported with Eyes Closed Able to stand 10 seconds safely   Standing Ubsupported with Feet Together Able to place feet together independently and stand 1 minute safely   From Standing, Reach Forward with Outstretched Arm Can reach forward >12 cm safely (5")   From Standing Position, Pick up Object from Floor Able to pick up shoe, needs supervision   From Standing Position, Turn to Look Behind Over each Shoulder Looks behind one side only/other side shows less weight shift   Turn 360 Degrees Able to turn 360 degrees safely but slowly   Standing Unsupported, Alternately Place Feet on Step/Stool Able to stand independently and complete 8 steps >20 seconds   Standing Unsupported, One Foot in Front Needs help to step but can hold 15 seconds   Standing on One Leg Tries to lift leg/unable to hold 3 seconds but remains standing independently   Total Score 41   LUE Tone   LUE Tone Mild   LLE Tone   LLE Tone Moderate                           PT Education - 02/19/16 2031    Education provided Yes   Education Details Educated pt and spouse (via phone) regarding 5/5 fall precautions, PT eval findings, and need to use an AD ( SPC) when ambulating to reduce risk for falls   Person(s) Educated Patient;Spouse   Methods Explanation   Comprehension Verbalized understanding          PT Short Term Goals - 02/19/16 2038    PT SHORT TERM GOAL #1   Title Patient and/or spouse will independently demo initial HEP in order to complete B LE strengthening, ROM, and stretching ther ex at home.    Time 2   Period Weeks   Status New   PT SHORT TERM GOAL #2   Title Patient and caregiver will independently verbalize 5/5 fall precautions in order to  reduce the risk for falls   Time 2   Period Weeks   Status New   PT SHORT TERM GOAL #3   Title Patient will ambulate for 5 consecutive minutes with the use of a SPC with improved heel to  toe sequence demo in order to imprve balance and tolerance with community ambulation.    Time 4   Period Weeks   Status New   PT SHORT TERM GOAL #4   Title Patient will demo improved repeated 5 sit to stand time to <11 seconds in order to improve performance with transfers.    Time 4   Period Weeks   Status New           PT Long Term Goals - 02/19/16 2041    PT LONG TERM GOAL #1   Title Patient will improve B LE strength to >4/5 MMT in order to improve balance and performance with functional mobility activities.    Time 8   Period Weeks   Status New   PT LONG TERM GOAL #2   Title Patient will improve Berg balance score to >47/56 in order to reduce the risk for falls with community ambulation.    Time 8   Period Weeks   Status New   PT LONG TERM GOAL #3   Title Patient will present with improved R hip PROM to [facility] in order to improve postural alignment and mobility with ambulation.    Time 8   Period Weeks   Status New   PT LONG TERM GOAL #4   Title Patient will be able to independently ambulate for 8 minutes with the use of a SPC with good heel to toe sequence.    Time 8   Period Weeks   Status New   PT LONG TERM GOAL #5   Title Patient and/or spouse will independently demo advanced HEP in order to complete B LE strengthening, ROM, and stretching ther ex at home once DC from PT.     Time 8   Period Weeks   Status New   Additional Long Term Goals   Additional Long Term Goals Yes   PT LONG TERM GOAL #6   Title Patient will report decreased LBP and L LE pain to 2-5/10 on a VAS in order to improve quality of life.    Time 8   Period Weeks   Status New               Plan - 02/19/16 2033    Clinical Impression Statement Mr. Tenerelli is a 75 year old male who was referred to outpatient physical therapy secondary to gait instability with a history of Parkinson's disease. The patient presents with signs and symptoms that are consistent with M.D. referral of unstable gait.  In addition, patient presents with impairments and limitations including low back pain with left lower extremity radicular symptoms, left lower extremity weakness, increased rigidity of the left lower extremity and trunk, impaired left hip ROM, impaired balance, difficulty with walking with a shuffling gait pattern, and impaired safety awareness.  The following impairments limitations are consistent with history of Parkinson's disease. Patient is at high risk for falls with Merrilee Jansky balance score of 41/56. The patient currently ambulates without an assistive device. Initiated gait training this visit with the use of a single point cane with focus on increased heel strike and stride length. Patient presented with improved gait pattern and stability while ambulating with a single point cane. Therapist recommended the patient to  acquire a single point cane in order to improve safety, balance, and endurance with household and community ambulation. In addition, therapist called and spoke to the patient's wife via phone call regarding 5/5 safety measures and recommendation for a single point cane. The patient would benefit from skilled physical therapy in order to address current limitations and impairments. Due to high insurance co-pay, patient and spouse have requested to limit physical therapy visits to 1X/week at this time. The patient and spouse are in agreement with the proposed physical therapy plan of care within requested frequency.   Pt will benefit from skilled therapeutic intervention in order to improve on the following deficits Abnormal gait;Decreased coordination;Decreased range of motion;Difficulty walking;Impaired tone;Decreased safety awareness;Decreased endurance;Decreased activity tolerance;Pain;Improper body mechanics;Impaired flexibility;Decreased balance;Decreased mobility;Decreased strength;Postural dysfunction   Rehab Potential Fair   Clinical Impairments Affecting Rehab Potential hx of PD    PT  Frequency 1x / week   PT Duration 8 weeks   PT Treatment/Interventions ADLs/Self Care Home Management;Moist Heat;Cryotherapy;Balance training;Therapeutic exercise;Therapeutic activities;Functional mobility training;Stair training;Gait training;DME Instruction;Neuromuscular re-education;Patient/family education;Manual techniques;Passive range of motion   PT Next Visit Plan Next visit to focus on B hip PROM in supine, functional LE strengthening ther ex, gait training with SPC, and standing static/dynamic balance training.   PT Home Exercise Plan Initiate HEP and provide handout including seated hip/knee strengthening ther ex and hip AAROM ther ex   Recommended Other Services None at this time    Consulted and Agree with Plan of Care Patient;Family member/caregiver   Family Member Consulted Spouse ( via phone call)           G-Codes - 03/18/16 04/12/2046    Functional Assessment Tool Used Per clinical findings: Berg score, MMT, pain, ROM, functional mobility, and gait quality    Functional Limitation Mobility: Walking and moving around   Mobility: Walking and Moving Around Current Status [phone]) At least 60 percent but less than 80 percent impaired, limited or restricted   Mobility: Walking and Moving Around Goal Status [phone]) At least 40 percent but less than 60 percent impaired, limited or restricted       Problem List There are no active problems to display for this patient.   Garen Lah, PT, DPT  02/20/2016, 7:57 AM  [facility] [address] [place], [place], [address] Phone: [phone]   Fax:  [phone]  Name: Iyad Mcvoy MRN: [id number] Date of Birth: [birth year]

## 2016-04-24 ENCOUNTER — Ambulatory Visit: Payer: Self-pay | Admitting: Urology

## 2016-06-10 ENCOUNTER — Other Ambulatory Visit: Payer: Self-pay | Admitting: Neurology

## 2016-06-10 DIAGNOSIS — R410 Disorientation, unspecified: Secondary | ICD-10-CM

## 2016-06-19 ENCOUNTER — Ambulatory Visit (HOSPITAL_COMMUNITY): Payer: PRIVATE HEALTH INSURANCE

## 2016-06-22 ENCOUNTER — Ambulatory Visit (HOSPITAL_COMMUNITY)
Admission: RE | Admit: 2016-06-22 | Discharge: 2016-06-22 | Disposition: A | Discharge status: Home / Self Care | Payer: Medicare Other | Source: Ambulatory Visit | Attending: Neurology | Admitting: Neurology

## 2016-06-22 ENCOUNTER — Other Ambulatory Visit (HOSPITAL_COMMUNITY): Payer: Self-pay | Admitting: Neurology

## 2016-06-22 ENCOUNTER — Other Ambulatory Visit: Payer: Self-pay | Admitting: Neurology

## 2016-06-22 DIAGNOSIS — R918 Other nonspecific abnormal finding of lung field: Secondary | ICD-10-CM | POA: Insufficient documentation

## 2016-06-22 DIAGNOSIS — R410 Disorientation, unspecified: Secondary | ICD-10-CM

## 2016-06-22 DIAGNOSIS — I639 Cerebral infarction, unspecified: Secondary | ICD-10-CM | POA: Insufficient documentation

## 2016-06-22 DIAGNOSIS — G319 Degenerative disease of nervous system, unspecified: Secondary | ICD-10-CM | POA: Insufficient documentation

## 2016-07-01 ENCOUNTER — Other Ambulatory Visit: Payer: Self-pay | Admitting: Neurology

## 2016-07-01 DIAGNOSIS — R531 Weakness: Secondary | ICD-10-CM

## 2016-07-02 ENCOUNTER — Ambulatory Visit (HOSPITAL_COMMUNITY)
Admission: RE | Admit: 2016-07-02 | Discharge: 2016-07-02 | Disposition: A | Discharge status: Home / Self Care | Payer: Medicare Other | Source: Ambulatory Visit | Attending: Neurology | Admitting: Neurology

## 2016-07-02 DIAGNOSIS — I6523 Occlusion and stenosis of bilateral carotid arteries: Secondary | ICD-10-CM | POA: Insufficient documentation

## 2016-07-02 DIAGNOSIS — R531 Weakness: Secondary | ICD-10-CM | POA: Diagnosis present

## 2016-07-22 ENCOUNTER — Telehealth: Payer: Self-pay | Admitting: Internal Medicine

## 2016-07-22 ENCOUNTER — Telehealth: Payer: Self-pay

## 2016-07-22 NOTE — Telephone Encounter (Signed)
FAXED NOTES TO NL TO [id number]

## 2016-07-22 NOTE — Telephone Encounter (Deleted)
error 

## 2016-07-23 ENCOUNTER — Telehealth: Payer: Self-pay | Admitting: Internal Medicine

## 2016-07-23 NOTE — Telephone Encounter (Signed)
Received records from [facility] for appointment with Dr Debara Pickett on 08/21/16.   Records given to [facility] (medical records) for  Dr Lysbeth Penner schedule on 08/21/16.  lp

## 2016-07-31 NOTE — Telephone Encounter (Signed)
Resend to NL. thanks

## 2016-08-21 ENCOUNTER — Ambulatory Visit: Payer: Self-pay | Admitting: Internal Medicine

## 2016-09-02 ENCOUNTER — Ambulatory Visit (INDEPENDENT_AMBULATORY_CARE_PROVIDER_SITE_OTHER): Payer: Medicare Other | Admitting: Internal Medicine

## 2016-09-02 ENCOUNTER — Encounter: Payer: Self-pay | Admitting: Internal Medicine

## 2016-09-02 VITALS — BP 168/82 | HR 89 | Ht 68.5 in | Wt 217.6 lb

## 2016-09-02 DIAGNOSIS — R011 Cardiac murmur, unspecified: Secondary | ICD-10-CM | POA: Diagnosis not present

## 2016-09-02 DIAGNOSIS — Z01810 Encounter for preprocedural cardiovascular examination: Secondary | ICD-10-CM | POA: Insufficient documentation

## 2016-09-02 DIAGNOSIS — I63239 Cerebral infarction due to unspecified occlusion or stenosis of unspecified carotid arteries: Secondary | ICD-10-CM | POA: Diagnosis not present

## 2016-09-02 DIAGNOSIS — R9431 Abnormal electrocardiogram [ECG] [EKG]: Secondary | ICD-10-CM | POA: Diagnosis not present

## 2016-09-02 NOTE — Patient Instructions (Addendum)
Medication Instructions:  Your physician recommends that you continue on your current medications as directed. Please refer to the Current Medication list given to you today.  Labwork: None Ordered   Testing/Procedures: Your physician has requested that you have an echocardiogram. Echocardiography is a painless test that uses sound waves to create images of your heart. It provides your doctor with information about the size and shape of your heart and how well your heart's chambers and valves are working. This procedure takes approximately one hour. There are no restrictions for this procedure.  Your physician has requested that you have a lexiscan myoview. For further information please visit [email or web address]. Please follow instruction sheet, as given.   Follow-Up: Your physician recommends that you schedule a follow-up appointment in: AFTER ALL TESTING HAS BEEN COMPLETE.  Any Other Special Instructions Will Be Listed Below (If Applicable).  Referral To [facility] with Dr Chaya Jan   NEXT APPT  DATE:__________________________________________  TIME:___________________________________AM/PM  If you need a refill on your cardiac medications before your next appointment, please call your pharmacy.

## 2016-09-02 NOTE — Progress Notes (Signed)
OFFICE NOTE  Chief Complaint:  "My neurologist sent me"  Primary Care Physician: Wende Neighbors, MD  HPI:  [name] is a 75 y.o. male who is referred to me by Dr. Merlene Laughter in [place] or evaluation of carotid stenosis. [name] was apparently sent to [facility] neurology for evaluation of parkinsonism and outbursts of irritability and anger related to anxiety. He had cerebral imaging showing old right hemispheric cortical infarct. Ultimately he underwent carotid Dopplers which indicate severe right carotid artery stenosis. Based on these findings he was sent for evaluation and possible endarterectomy. [name] has not had a lot of medical care in the past. He previously saw Dr. Orson Ape with [facility] and currently follows with Dr. [place] Crane. There is cancer in the family in both parents but he does not report heart disease in his parents rather his brother who accompanied him today apparently has some heart disease. [name] denies any chest pain but reports pain in his left side it radiates all the way down to his left leg. He denies any shortness of breath or worsening fatigue. He reports she's never passed out or felt presyncopal. He denies any headache.  PMHx:  Past Medical History:  Diagnosis Date  . Anxiety   . GERD (gastroesophageal reflux disease)   . Hyperlipidemia   . Hypertension     Past Surgical History:  Procedure Laterality Date  . BLADDER REPAIR     pt sts "when I had my prostatectomy they cut too much and I have an internal button I have to press in order to release my urine"  . CATARACT EXTRACTION W/PHACO  07/28/2011   Procedure: CATARACT EXTRACTION PHACO AND INTRAOCULAR LENS PLACEMENT (IOC);  Surgeon: Elta Guadeloupe T. Gershon Crane;  Location: [facility];  Service: Ophthalmology;  Laterality: Right;  CDE: 20.26  . CATARACT EXTRACTION W/PHACO  09/08/2011   Procedure: CATARACT EXTRACTION PHACO AND INTRAOCULAR LENS PLACEMENT (IOC);  Surgeon: Elta Guadeloupe T. Gershon Crane;  Location: [facility];  Service:  Ophthalmology;  Laterality: Left;  CDE:37.31  . PROSTATECTOMY    . YAG LASER APPLICATION  [id number]   Procedure: YAG LASER APPLICATION;  Surgeon: Elta Guadeloupe T. Gershon Crane, MD;  Location: [facility];  Service: Ophthalmology;  Laterality: Right;    FAMHx:  Family History  Problem Relation Age of Onset  . Anesthesia problems Neg Hx   . Hypotension Neg Hx   . Malignant hyperthermia Neg Hx   . Pseudochol deficiency Neg Hx     SOCHx:   reports that he quit smoking about 33 years ago. His smoking use included Cigarettes. He has a 5.00 pack-year smoking history. He does not have any smokeless tobacco history on file. He reports that he does not drink alcohol or use drugs.  ALLERGIES:  Allergies  Allergen Reactions  . Ciprofloxacin Other (See Comments)    Patient just knows that the was told that he was allergic    ROS: Pertinent items noted in HPI and remainder of comprehensive ROS otherwise negative.  HOME MEDS: Current Outpatient Prescriptions on File Prior to Visit  Medication Sig Dispense Refill  . acetaminophen (TYLENOL) 650 MG CR tablet Take 650 mg by mouth every 8 (eight) hours as needed for pain.    Marland Kitchen amLODipine (NORVASC) 5 MG tablet Take 5 mg by mouth at bedtime.    . carbidopa-levodopa (SINEMET) 25-100 MG per tablet Take 1 tablet by mouth 2 (two) times daily.     . citalopram (CELEXA) 40 MG tablet Take 40 mg by mouth at  bedtime.     . DULoxetine (CYMBALTA) 30 MG capsule Take 30 mg by mouth 2 (two) times daily.    . folic acid (FOLVITE) 1 MG tablet Take 1 mg by mouth daily.      Marland Kitchen gabapentin (NEURONTIN) 100 MG capsule Take 100 mg by mouth 3 (three) times daily.    . hydrochlorothiazide (HYDRODIURIL) 25 MG tablet Take 25 mg by mouth daily.    Marland Kitchen levothyroxine (SYNTHROID, LEVOTHROID) 88 MCG tablet Take 88 mcg by mouth daily before breakfast.    . losartan (COZAAR) 100 MG tablet Take 100 mg by mouth at bedtime.    . Multiple Vitamins-Minerals (MULTIVITAMIN WITH MINERALS) tablet Take 1  tablet by mouth daily.      Marland Kitchen oxybutynin (DITROPAN) 5 MG tablet Take 5 mg by mouth 2 (two) times daily.    . rasagiline (AZILECT) 0.5 MG TABS Take 0.5 mg by mouth at bedtime.     . simvastatin (ZOCOR) 10 MG tablet Take 10 mg by mouth at bedtime.      . traMADol (ULTRAM) 50 MG tablet Take 50 mg by mouth at bedtime.    . Vitamin D, Ergocalciferol, (DRISDOL) 50000 units CAPS capsule Take 50,000 Units by mouth every 7 (seven) days.     No current facility-administered medications on file prior to visit.     LABS/IMAGING: No results found for this or any previous visit (from the past 48 hour(s)). No results found.  WEIGHTS: Wt Readings from Last 3 Encounters:  09/02/16 217 lb 9.6 oz (98.7 kg)  06/07/15 212 lb (96.2 kg)  07/24/11 200 lb (90.7 kg)    VITALS: BP (!) 168/82   Pulse 89   Ht 5' 8.5" (1.74 m)   Wt 217 lb 9.6 oz (98.7 kg)   BMI 32.60 kg/m   EXAM: General appearance: alert and no distress Neck: no JVD and Right carotid artery bruit Lungs: clear to auscultation bilaterally Heart: regular rate and rhythm and systolic murmur: Midsystolic 3/6, crescendo at 2nd right intercostal space Abdomen: soft, non-tender; bowel sounds normal; no masses,  no organomegaly Extremities: extremities normal, atraumatic, no cyanosis or edema Pulses: 2+ and symmetric Skin: Skin color, texture, turgor normal. No rashes or lesions Neurologic: Mental status: Alert, somewhat jovial, somewhat tangential, no notable rest tremor Psych: Affable, needs redirection  EKG: Normal sinus rhythm at 89, T-wave abnormalities inferiorly concerning for ischemia  ASSESSMENT: 1. Severe right carotid artery stenosis 2. History of prior cortical stroke 3. Systolic murmur concerning for aortic stenosis 4. Abnormal EKG suggestive of ischemia 5. Parkinson's disease  PLAN: 1.   [name] has severe carotid artery stenosis by recent ultrasound and signs of prior cortical stroke. He will be referred to vein and  vascular surgery for evaluation of carotid endarterectomy. I will obtain labs from Dr. Juel Burrow office including a recent lipid profile to make sure he is optimized on his current cholesterol medication. In addition his exam is concerning for murmur which sounds like aortic stenosis. I like to get an echocardiogram to confirm that. Finally, there are EKG abnormalities concerning for ischemia. As he is likely to have carotid artery surgery, he will need risk stratification prior to that. Given his abnormal EKG, vascular disease and valvular heart disease, there is a high likelihood of coronary disease. I recommend a Lexiscan Myoview as he is not able to walk on a treadmill given his Parkinson's disease.  Follow-up with me after testing. Thanks for allowing me to participate in his care.  Chrissie Noa  C. Debara Pickett, MD, [facility] Attending Cardiologist [place] 09/02/2016, 4:44 PM

## 2016-09-09 ENCOUNTER — Encounter: Payer: Self-pay | Admitting: Surgery

## 2016-09-10 ENCOUNTER — Ambulatory Visit (HOSPITAL_COMMUNITY): Payer: Medicare Other

## 2016-09-10 ENCOUNTER — Other Ambulatory Visit: Payer: Self-pay | Admitting: *Deleted

## 2016-09-10 DIAGNOSIS — I6521 Occlusion and stenosis of right carotid artery: Secondary | ICD-10-CM

## 2016-09-14 ENCOUNTER — Other Ambulatory Visit: Payer: Self-pay

## 2016-09-14 ENCOUNTER — Ambulatory Visit (INDEPENDENT_AMBULATORY_CARE_PROVIDER_SITE_OTHER): Payer: Medicare Other | Admitting: Surgery

## 2016-09-14 ENCOUNTER — Ambulatory Visit (HOSPITAL_COMMUNITY)
Admission: RE | Admit: 2016-09-14 | Discharge: 2016-09-14 | Disposition: A | Discharge status: Home / Self Care | Payer: Medicare Other | Source: Ambulatory Visit | Attending: Surgery | Admitting: Surgery

## 2016-09-14 ENCOUNTER — Encounter: Payer: Self-pay | Admitting: Surgery

## 2016-09-14 VITALS — BP 143/84 | HR 96 | Temp 97.5°F | Resp 20 | Ht 68.5 in | Wt 215.8 lb

## 2016-09-14 DIAGNOSIS — I6521 Occlusion and stenosis of right carotid artery: Secondary | ICD-10-CM

## 2016-09-14 LAB — VAS US CAROTID
RCCAPSYS: 74 cm/s
RIGHT CCA MID DIAS: 9 cm/s
RIGHT ECA DIAS: -11 cm/s
Right cca dist sys: -70 cm/s

## 2016-09-14 NOTE — Progress Notes (Signed)
HISTORY AND PHYSICAL     CC:  "stopped up vein in neck" Referring Provider:  Celene Squibb, MD  HPI: This is a 75 y.o. male who was referred to [facility] by Dr. Debara Pickett.  He was referred to him by Dr. Merlene Laughter in [place] for evaluation of carotid artery stenosis.  He was sent to a neurology group for evaluation of his Parkinson's Disease & outbursts of irritability and anger related to anxiety.  He did have a head CT scan, which revealed a large old right hemispheric stroke.  He subsequently underwent a carotid duplex, which revealed 70-99% right ICA stenosis.    Dr. Debara Pickett is optimizing medications.  He is also concerned for a murmur suspicious for aortic stenosis.  He will get an echo to confirm this.  There were also EKG changes concerning for ischemia.  Given he may need carotid endarterectomy, he will need risk stratification prior.  Given he has abnormal EKG, vascular dz, and valvular heart dz, there is high likelihood of CAD.  He recommended a [employer] as he is not able to walk on the treadmill bc of his Parkinson's.    He states that he does get cramping in his left lower leg from his calf down to his foot.  He states that he is unable to walk due to this.  He states that it happens before he even starts walking.  He states that this started over a year ago.   He says he wants to get this fixed so he can walk without falling.   Pt has a remote hx of tobacco use, but does use chewing tobacco.   He takes baby aspirin x 2 daily.  He is on a CCB and ARB for blood pressure control as well as HCTZ.  He does take Cymbalta for depression.  He is on carbidopa-levodopa as well.    Past Medical History:  Diagnosis Date  . Anxiety   . GERD (gastroesophageal reflux disease)   . Hyperlipidemia   . Hypertension     Past Surgical History:  Procedure Laterality Date  . BLADDER REPAIR     pt sts "when I had my prostatectomy they cut too much and I have an internal button I have to press in  order to release my urine"  . CATARACT EXTRACTION W/PHACO  07/28/2011   Procedure: CATARACT EXTRACTION PHACO AND INTRAOCULAR LENS PLACEMENT (IOC);  Surgeon: Elta Guadeloupe T. Gershon Crane;  Location: [facility];  Service: Ophthalmology;  Laterality: Right;  CDE: 20.26  . CATARACT EXTRACTION W/PHACO  09/08/2011   Procedure: CATARACT EXTRACTION PHACO AND INTRAOCULAR LENS PLACEMENT (IOC);  Surgeon: Elta Guadeloupe T. Gershon Crane;  Location: [facility];  Service: Ophthalmology;  Laterality: Left;  CDE:37.31  . PROSTATECTOMY    . YAG LASER APPLICATION  [id number]   Procedure: YAG LASER APPLICATION;  Surgeon: Elta Guadeloupe T. Gershon Crane, MD;  Location: [facility];  Service: Ophthalmology;  Laterality: Right;    Allergies  Allergen Reactions  . Ciprofloxacin Other (See Comments)    Patient just knows that the was told that he was allergic    Current Outpatient Prescriptions  Medication Sig Dispense Refill  . acetaminophen (TYLENOL) 650 MG CR tablet Take 650 mg by mouth every 8 (eight) hours as needed for pain.    Marland Kitchen amLODipine (NORVASC) 5 MG tablet Take 5 mg by mouth at bedtime.    . carbidopa-levodopa (SINEMET) 25-100 MG per tablet Take 1 tablet by mouth 2 (two) times daily.     . citalopram (CELEXA)  40 MG tablet Take 40 mg by mouth at bedtime.     . DULoxetine (CYMBALTA) 30 MG capsule Take 30 mg by mouth 2 (two) times daily.    . folic acid (FOLVITE) 1 MG tablet Take 1 mg by mouth daily.      Marland Kitchen gabapentin (NEURONTIN) 100 MG capsule Take 100 mg by mouth 3 (three) times daily.    . hydrochlorothiazide (HYDRODIURIL) 25 MG tablet Take 25 mg by mouth daily.    Marland Kitchen levothyroxine (SYNTHROID, LEVOTHROID) 88 MCG tablet Take 88 mcg by mouth daily before breakfast.    . losartan (COZAAR) 100 MG tablet Take 100 mg by mouth at bedtime.    . Multiple Vitamins-Minerals (MULTIVITAMIN WITH MINERALS) tablet Take 1 tablet by mouth daily.      Marland Kitchen oxybutynin (DITROPAN) 5 MG tablet Take 5 mg by mouth 2 (two) times daily.    . rasagiline (AZILECT) 0.5 MG TABS Take 0.5 mg  by mouth at bedtime.     . simvastatin (ZOCOR) 10 MG tablet Take 10 mg by mouth at bedtime.      . traMADol (ULTRAM) 50 MG tablet Take 50 mg by mouth at bedtime.    . Vitamin D, Ergocalciferol, (DRISDOL) 50000 units CAPS capsule Take 50,000 Units by mouth every 7 (seven) days.     No current facility-administered medications for this visit.     Family History  Problem Relation Age of Onset  . Cancer Mother   . Cancer Father   . Anesthesia problems Neg Hx   . Hypotension Neg Hx   . Malignant hyperthermia Neg Hx   . Pseudochol deficiency Neg Hx     Social History   Social History  . Marital status: Married    Spouse name: N/A  . Number of children: N/A  . Years of education: N/A   Occupational History  . Not on file.   Social History Main Topics  . Smoking status: Former Smoker    Packs/day: 0.50    Years: 10.00    Types: Cigarettes    Quit date: 07/24/1983  . Smokeless tobacco: Not on file  . Alcohol use No  . Drug use: No  . Sexual activity: No   Other Topics Concern  . Not on file   Social History Narrative  . No narrative on file     REVIEW OF SYSTEMS:   [X]  denotes positive finding, [ ]  denotes negative finding Cardiac  Comments:  Chest pain or chest pressure:    Shortness of breath upon exertion:    Short of breath when lying flat:    Irregular heart rhythm:        Vascular    Pain in calf, thigh, or hip brought on by ambulation: x See HPI  Pain in feet at night that wakes you up from your sleep:  x See HPI  Blood clot in your veins:    Leg swelling:  x       Pulmonary    Oxygen at home:    Productive cough:     Wheezing:         Neurologic    Sudden weakness in arms or legs:     Sudden numbness in arms or legs:     Sudden onset of difficulty speaking or slurred speech:    Temporary loss of vision in one eye:     Problems with dizziness:  x       Gastrointestinal    Blood in stool:  Vomited blood:         Genitourinary    Burning  when urinating:     Blood in urine:        Psychiatric    Major depression:  x       Hematologic    Bleeding problems:    Problems with blood clotting too easily:        Skin    Rashes or ulcers: x       Constitutional    chills x     PHYSICAL EXAMINATION:  Vitals:   09/14/16 1257 09/14/16 1306  BP: 134/77 (!) 143/84  Pulse: 96   Resp: 20   Temp: 97.5 F (36.4 C)    Body mass index is 32.34 kg/m.  General:  WDWN in NAD; vital signs documented above Gait: Not observed HENT: WNL, normocephalic Pulmonary: normal non-labored breathing , without Rales, rhonchi,  wheezing Cardiac: regular HR, with Murmur; without carotid bruits Abdomen: soft, NT, no masses Skin: without rashes Vascular Exam/Pulses:  Right Left  Radial 2+ (normal) 2+ (normal)  DP absent monophasic  Peroneal triphasic biphasic  PT triphasic Triphasic    Extremities: without ischemic changes, without Gangrene , without cellulitis; without open wounds;  Musculoskeletal: no muscle wasting or atrophy  Neurologic: A&O X 3;  No focal weakness or paresthesias are detected    Non-Invasive Vascular Imaging:   Carotid duplex outside facility 07/02/16: 1.  Severe 70-99% stenosis of the proximal right ICA secondary to heterogenous atherosclerotic plaque 2. Mild 1-49% stenosis left proximal ICA 3. Vertebral arteries are patent with antegrade flow  Carotid duplex 09/14/16: 1.  60-79% stenosis of the right proximal ICA  CT scan head 06/22/16: Large remote right hemispheric infarct involving right frontal lobe, right parietal lobe and right corona radiata/ caudate. Subsequent mild dilation right lateral ventricle.  Remote small left caudate head infarct.  Small pontine infarcts probably chronic.  Chronic microvascular changes without CT evidence large acute infarct.  Global atrophy without hydrocephalus.  No intracranial mass lesion noted on this unenhanced exam.  Post lens replacement without  orbital abnormality noted.  Mastoid air cells, middle ear cavities and majority of visualized paranasal sinuses are clear with exception of partial opacification aerated aspect superior left pterygoid plate.  IMPRESSION: Remote infarcts and atrophy as noted above without evidence of intracranial hemorrhage or CT findings to suggest large acute infarct.  Global atrophy without hydrocephalus.  Partial opacification aerated aspect superior portion left pterygoid Plate.   Pt meds includes: Statin:  Yes.   Beta Blocker:  No. Aspirin:  Yes.   ACEI:  No. ARB:  Yes.   Other Antiplatelet/Anticoagulant:  No.    ASSESSMENT/PLAN:: 74 y.o. male with 60-79% right carotid artery stenosis in setting of old right hemispheric stroke.    -pt states that he has not had any hemiparesis or speech difficulties or amaurosis fugax.  His left leg issues could be due to his stroke, but is difficult to tell. His old right hemispheric stroke could be due to his carotid stenosis, but given his cardiac issues, this could also be the source.  The pt does have appointment on Thursday, October 5th for Lexiscan and echocardiogram and f/u with Dr. Hilty on October 16th and pending his evaluation, the pt will be scheduled for a right carotid endarterectomy on October 25th.  If he is high risk for endarterectomy, may have to consider carotid stenting.     Kaycie Pegues, PA-C [facility] [phone]  Clinic MD:    Pt seen and examined in conjunction with Dr. Trula Slade   I agree with the above.  I have seen and evaluated the patient.  From a vascular perspective he has 2 issues the first is right carotid stenosis which by ultrasound today was at the high and of the 60-79% category.  The patient has a CT scan which shows a right brain stroke.  He was asymptomatic from his stroke.  The timing is unknown.  He is not actively having symptoms.  Because of the degree of stenosis in the carotid as well  as his history of stroke despite it being asymptomatic, I feel that carotid endarterectomy is indicated.  However, the patient has multiple comorbidities and is in the process of being cleared for surgery from a cardiac standpoint which will be performed by Dr. Debara Pickett within the next few weeks.  I have recommended proceeding with right carotid endarterectomy which I have tentatively scheduled for Wednesday, October 25.  I discussed the risks and benefits with the patient and his brother including the risk of stroke, nerve injury, bleeding.  All their questions were answered.  The patient's second vascular issue is that of left leg claudication.  I told him that I would evaluate this with Doppler studies while he is in the hospital with his carotid.  Annamarie Major

## 2016-09-16 ENCOUNTER — Other Ambulatory Visit: Payer: Self-pay | Admitting: *Deleted

## 2016-09-16 DIAGNOSIS — R9431 Abnormal electrocardiogram [ECG] [EKG]: Secondary | ICD-10-CM

## 2016-09-17 ENCOUNTER — Encounter (HOSPITAL_COMMUNITY): Payer: Medicare Other

## 2016-09-17 ENCOUNTER — Ambulatory Visit (HOSPITAL_COMMUNITY)
Admission: RE | Admit: 2016-09-17 | Discharge: 2016-09-17 | Disposition: A | Discharge status: Home / Self Care | Payer: Medicare Other | Source: Ambulatory Visit | Attending: Internal Medicine | Admitting: Internal Medicine

## 2016-09-17 ENCOUNTER — Encounter (HOSPITAL_COMMUNITY): Admission: RE | Admit: 2016-09-17 | Payer: Medicare Other | Source: Ambulatory Visit

## 2016-09-17 ENCOUNTER — Inpatient Hospital Stay (HOSPITAL_COMMUNITY): Admission: RE | Admit: 2016-09-17 | Payer: Self-pay | Source: Ambulatory Visit

## 2016-09-17 DIAGNOSIS — R011 Cardiac murmur, unspecified: Secondary | ICD-10-CM | POA: Diagnosis not present

## 2016-09-17 NOTE — Progress Notes (Signed)
*  PRELIMINARY RESULTS* Echocardiogram 2D Echocardiogram has been performed.  [name] 09/17/2016, 1:10 PM

## 2016-09-22 ENCOUNTER — Encounter (HOSPITAL_COMMUNITY)
Admission: RE | Admit: 2016-09-22 | Discharge: 2016-09-22 | Disposition: A | Discharge status: Home / Self Care | Payer: Medicare Other | Source: Ambulatory Visit | Attending: Internal Medicine | Admitting: Internal Medicine

## 2016-09-22 ENCOUNTER — Encounter (HOSPITAL_COMMUNITY): Admission: RE | Admit: 2016-09-22 | Payer: Medicare Other | Source: Ambulatory Visit

## 2016-09-22 ENCOUNTER — Inpatient Hospital Stay (HOSPITAL_COMMUNITY): Admission: RE | Admit: 2016-09-22 | Payer: Self-pay | Source: Ambulatory Visit

## 2016-09-22 DIAGNOSIS — R9431 Abnormal electrocardiogram [ECG] [EKG]: Secondary | ICD-10-CM

## 2016-09-25 ENCOUNTER — Encounter (HOSPITAL_COMMUNITY)
Admission: RE | Admit: 2016-09-25 | Discharge: 2016-09-25 | Disposition: A | Discharge status: Home / Self Care | Payer: Medicare Other | Source: Ambulatory Visit | Attending: Internal Medicine | Admitting: Internal Medicine

## 2016-09-25 ENCOUNTER — Encounter (HOSPITAL_COMMUNITY): Payer: Self-pay

## 2016-09-25 DIAGNOSIS — R9431 Abnormal electrocardiogram [ECG] [EKG]: Secondary | ICD-10-CM | POA: Insufficient documentation

## 2016-09-25 LAB — NM MYOCAR MULTI W/SPECT W/WALL MOTION / EF
CHL CUP NUCLEAR SDS: 5
CHL CUP RESTING HR STRESS: 73 {beats}/min
CSEPPHR: 98 {beats}/min
LVDIAVOL: 79 mL (ref 62–150)
LVSYSVOL: 26 mL
RATE: 0.31
SRS: 8
SSS: 13
TID: 0.88

## 2016-09-25 MED ORDER — TECHNETIUM TC 99M TETROFOSMIN IV KIT
10.0000 | PACK | Freq: Once | INTRAVENOUS | Status: AC | PRN
  Administered 2016-09-25: 10.5 via INTRAVENOUS

## 2016-09-25 MED ORDER — REGADENOSON 0.4 MG/5ML IV SOLN
INTRAVENOUS | Status: AC
  Administered 2016-09-25: 0.4 mg via INTRAVENOUS
  Filled 2016-09-25: qty 5

## 2016-09-25 MED ORDER — TECHNETIUM TC 99M TETROFOSMIN IV KIT
30.0000 | PACK | Freq: Once | INTRAVENOUS | Status: AC | PRN
  Administered 2016-09-25: 30 via INTRAVENOUS

## 2016-09-25 MED ORDER — SODIUM CHLORIDE 0.9% FLUSH
INTRAVENOUS | Status: AC
  Administered 2016-09-25: 10 mL via INTRAVENOUS
  Filled 2016-09-25: qty 10

## 2016-09-28 ENCOUNTER — Ambulatory Visit (INDEPENDENT_AMBULATORY_CARE_PROVIDER_SITE_OTHER): Payer: Medicare Other | Admitting: Internal Medicine

## 2016-09-28 ENCOUNTER — Encounter: Payer: Self-pay | Admitting: Internal Medicine

## 2016-09-28 VITALS — BP 139/78 | HR 98 | Ht 68.5 in | Wt 215.2 lb

## 2016-09-28 DIAGNOSIS — Z01810 Encounter for preprocedural cardiovascular examination: Secondary | ICD-10-CM

## 2016-09-28 DIAGNOSIS — I63239 Cerebral infarction due to unspecified occlusion or stenosis of unspecified carotid arteries: Secondary | ICD-10-CM | POA: Diagnosis not present

## 2016-09-28 DIAGNOSIS — R011 Cardiac murmur, unspecified: Secondary | ICD-10-CM

## 2016-09-28 DIAGNOSIS — R9431 Abnormal electrocardiogram [ECG] [EKG]: Secondary | ICD-10-CM | POA: Diagnosis not present

## 2016-09-28 NOTE — Patient Instructions (Signed)
Medication Instructions:   Your physician recommends that you continue on your current medications as directed. Please refer to the Current Medication list given to you today.  Labwork:  None  Testing/Procedures:  None  Follow-Up:  Your physician recommends that you schedule a follow-up appointment in: as needed.  Any Other Special Instructions Will Be Listed Below (If Applicable).  If you need a refill on your cardiac medications before your next appointment, please call your pharmacy. 

## 2016-09-28 NOTE — Progress Notes (Signed)
OFFICE NOTE  Chief Complaint:  Follow-up testing  Primary Care Physician: Wende Neighbors, MD  HPI:  Zarian Fennell is a 75 y.o. male who is referred to me by Dr. Merlene Laughter in [place] or evaluation of carotid stenosis. Mr. Housen was apparently sent to [facility] neurology for evaluation of parkinsonism and outbursts of irritability and anger related to anxiety. He had cerebral imaging showing old right hemispheric cortical infarct. Ultimately he underwent carotid Dopplers which indicate severe right carotid artery stenosis. Based on these findings he was sent for evaluation and possible endarterectomy. [name] has not had a lot of medical care in the past. He previously saw Dr. Orson Ape with [facility] and currently follows with Dr. Nevada Crane. There is cancer in the family in both parents but he does not report heart disease in his parents rather his brother who accompanied him today apparently has some heart disease. [name] denies any chest pain but reports pain in his left side it radiates all the way down to his left leg. He denies any shortness of breath or worsening fatigue. He reports she's never passed out or felt presyncopal. He denies any headache.  09/28/2016  Mr. Mellish returns today for follow-up. He underwent nuclear stress testing which was low risk and negative for ischemia. LVEF was 67%. He also had an echocardiogram which showed an EF of 123456, grade 2 diastolic dysfunction and mild aortic insufficiency. He had interim follow-up with Dr. Trula Slade in vascular surgery who is recommending carotid endarterectomy and may also do peripheral angiography as well as he apparently is having symptoms of PAD.  PMHx:  Past Medical History:  Diagnosis Date  . Anxiety   . GERD (gastroesophageal reflux disease)   . Hyperlipidemia   . Hypertension     Past Surgical History:  Procedure Laterality Date  . BLADDER REPAIR     pt sts "when I had my prostatectomy they cut too much and I have an  internal button I have to press in order to release my urine"  . CATARACT EXTRACTION W/PHACO  07/28/2011   Procedure: CATARACT EXTRACTION PHACO AND INTRAOCULAR LENS PLACEMENT (IOC);  Surgeon: Elta Guadeloupe T. Gershon Crane;  Location: [facility];  Service: Ophthalmology;  Laterality: Right;  CDE: 20.26  . CATARACT EXTRACTION W/PHACO  09/08/2011   Procedure: CATARACT EXTRACTION PHACO AND INTRAOCULAR LENS PLACEMENT (IOC);  Surgeon: Elta Guadeloupe T. Gershon Crane;  Location: [facility];  Service: Ophthalmology;  Laterality: Left;  CDE:37.31  . PROSTATECTOMY    . YAG LASER APPLICATION  [id number]   Procedure: YAG LASER APPLICATION;  Surgeon: Elta Guadeloupe T. Gershon Crane, MD;  Location: [facility];  Service: Ophthalmology;  Laterality: Right;    FAMHx:  Family History  Problem Relation Age of Onset  . Cancer Mother   . Cancer Father   . Anesthesia problems Neg Hx   . Hypotension Neg Hx   . Malignant hyperthermia Neg Hx   . Pseudochol deficiency Neg Hx     SOCHx:   reports that he quit smoking about 33 years ago. His smoking use included Cigarettes. He has a 5.00 pack-year smoking history. He does not have any smokeless tobacco history on file. He reports that he does not drink alcohol or use drugs.  ALLERGIES:  Allergies  Allergen Reactions  . Ciprofloxacin Other (See Comments)    Patient just knows that the was told that he was allergic    ROS: Pertinent items noted in HPI and remainder of comprehensive ROS otherwise negative.  HOME MEDS: Current Outpatient  Prescriptions on File Prior to Visit  Medication Sig Dispense Refill  . acetaminophen (TYLENOL) 650 MG CR tablet Take 650 mg by mouth every 8 (eight) hours as needed for pain.    Marland Kitchen amLODipine (NORVASC) 5 MG tablet Take 5 mg by mouth at bedtime.    . carbidopa-levodopa (SINEMET) 25-100 MG per tablet Take 1 tablet by mouth 2 (two) times daily.     . citalopram (CELEXA) 40 MG tablet Take 40 mg by mouth at bedtime.     . DULoxetine (CYMBALTA) 30 MG capsule Take 30 mg by mouth 2 (two)  times daily.    . folic acid (FOLVITE) 1 MG tablet Take 1 mg by mouth daily.      Marland Kitchen gabapentin (NEURONTIN) 100 MG capsule Take 100 mg by mouth 3 (three) times daily.    . hydrochlorothiazide (HYDRODIURIL) 25 MG tablet Take 25 mg by mouth daily.    Marland Kitchen levothyroxine (SYNTHROID, LEVOTHROID) 88 MCG tablet Take 88 mcg by mouth daily before breakfast.    . losartan (COZAAR) 100 MG tablet Take 100 mg by mouth at bedtime.    . Multiple Vitamins-Minerals (MULTIVITAMIN WITH MINERALS) tablet Take 1 tablet by mouth daily.      Marland Kitchen oxybutynin (DITROPAN) 5 MG tablet Take 5 mg by mouth 2 (two) times daily.    . rasagiline (AZILECT) 0.5 MG TABS Take 0.5 mg by mouth at bedtime.     . simvastatin (ZOCOR) 10 MG tablet Take 10 mg by mouth at bedtime.      . traMADol (ULTRAM) 50 MG tablet Take 50 mg by mouth at bedtime.    . Vitamin D, Ergocalciferol, (DRISDOL) 50000 units CAPS capsule Take 50,000 Units by mouth every 7 (seven) days.     No current facility-administered medications on file prior to visit.     LABS/IMAGING: No results found for this or any previous visit (from the past 48 hour(s)). No results found.  WEIGHTS: Wt Readings from Last 3 Encounters:  09/28/16 215 lb 3.2 oz (97.6 kg)  09/14/16 215 lb 12.8 oz (97.9 kg)  09/02/16 217 lb 9.6 oz (98.7 kg)    VITALS: BP 139/78   Pulse 98   Ht 5' 8.5" (1.74 m)   Wt 215 lb 3.2 oz (97.6 kg)   BMI 32.25 kg/m   EXAM: Deferred  EKG: Deferred  ASSESSMENT: 1. Low to intermediate risk for upcoming vascular surgery 2. Severe right carotid artery stenosis 3. History of prior cortical stroke 4. Systolic murmur concerning for aortic stenosis 5. Abnormal EKG suggestive of ischemia 6. Parkinson's disease  PLAN: 1.   [name] had a low risk Myoview as well as an echo which showed normal LV systolic function. He should be at low to intermediate risk for upcoming vascular surgery. He can follow-up with me on an as-needed basis should continue have  aggressive risk factor modification from his primary care provider.  Pixie Casino, MD, [facility] Attending Cardiologist [place] 09/28/2016, 4:41 PM

## 2016-09-30 ENCOUNTER — Telehealth: Payer: Self-pay | Admitting: Internal Medicine

## 2016-09-30 NOTE — Telephone Encounter (Signed)
Pt has a procedure 10-07-16, has pre admission testing tomorrow and doesn't have a ride, can he RS ?

## 2016-09-30 NOTE — Telephone Encounter (Signed)
Returned call to patient.Spoke to wife she stated husband is scheduled for pre admission testing at [facility] tomorrow.Stated he needs to change to a different day.Advised to call Pre Admissions at [facility] we do not schedule on their schedule.

## 2016-10-01 ENCOUNTER — Inpatient Hospital Stay (HOSPITAL_COMMUNITY): Admission: RE | Admit: 2016-10-01 | Payer: Self-pay | Source: Ambulatory Visit

## 2016-10-02 ENCOUNTER — Inpatient Hospital Stay (HOSPITAL_COMMUNITY)
Admission: RE | Admit: 2016-10-02 | Discharge: 2016-10-02 | Disposition: A | Discharge status: Home / Self Care | Payer: Self-pay | Source: Ambulatory Visit

## 2016-10-02 NOTE — Pre-Procedure Instructions (Signed)
    [name]  10/02/2016     Your procedure is scheduled on Wednesday, October 25.  Report to [facility] Admitting at 9:15 AM                  Your surgery or procedure is scheduled for 11:15 am   Call this number if you have problems the morning of surgery:[phone]                 For any other questions, please call [phone], Monday - Friday 8 AM - 4 PM.   Remember:  Do not eat food or drink liquids after midnight Tuesday, October 24.  Take these medicines the morning of surgery with A SIP OF WATER:carbidopa-levodopa (SINEMET),  DULoxetine (CYMBALTA), gabapentin (NEURONTIN).                 May take acetaminophen (TYLENOL) or Tramadol.   Do not wear jewelry, make-up or nail polish.  Do not wear lotions, powders, or perfumes, or deodorant.  .Men may shave face and neck.  Do not bring valuables to the hospital.  [facility] is not responsible for any belongings or valuables.  Contacts, dentures or bridgework may not be worn into surgery.  Leave your suitcase in the car.  After surgery it may be brought to your room.  For patients admitted to the hospital, discharge time will be determined by your treatment team.  Patients discharged the day of surgery will not be allowed to drive home.   Special instructions:  Review  [place] - Preparing For Surgery.  Please read over the following fact sheets that you were given. Pain Booklet and Coughing and Deep Breathing, - Preparing For Surgery and Patient Instructions for Mupirocin Application

## 2016-10-05 NOTE — Pre-Procedure Instructions (Signed)
    [name]  10/05/2016     Your procedure is scheduled on Wednesday, October 25.  Report to [facility] Admitting at 9:15 AM                  Your surgery or procedure is scheduled for 11:15 am   Call this number if you have problems the morning of [phone]                 For any other questions, please call [phone], Monday - Friday 8 AM - 4 PM.   Remember:  Do not eat food or drink liquids after midnight Tuesday, October 24.  Take these medicines the morning of surgery with A SIP OF WATER:carbidopa-levodopa (SINEMET),  DULoxetine (CYMBALTA), gabapentin (NEURONTIN),  levothyroxine (SYNTHROID.                May take acetaminophen (TYLENOL) or Tramadol.   Do not wear jewelry, make-up or nail polish.  Do not wear lotions, powders, or perfumes, or deodorant.  .Men may shave face and neck.  Do not bring valuables to the hospital.  [facility] is not responsible for any belongings or valuables.  Contacts, dentures or bridgework may not be worn into surgery.  Leave your suitcase in the car.  After surgery it may be brought to your room.  For patients admitted to the hospital, discharge time will be determined by your treatment team.  Patients discharged the day of surgery will not be allowed to drive home.   Special instructions:  Review  [place] - Preparing For Surgery.  Please read over the following fact sheets that you were given. Pain Booklet and Coughing and Deep Breathing, [place]- Preparing For Surgery and Patient Instructions for Mupirocin Application

## 2016-10-06 ENCOUNTER — Encounter (HOSPITAL_COMMUNITY): Payer: Self-pay

## 2016-10-06 ENCOUNTER — Encounter (HOSPITAL_COMMUNITY)
Admission: RE | Admit: 2016-10-06 | Discharge: 2016-10-06 | Disposition: A | Discharge status: Home / Self Care | Payer: Medicare Other | Source: Ambulatory Visit | Attending: Surgery | Admitting: Surgery

## 2016-10-06 DIAGNOSIS — I10 Essential (primary) hypertension: Secondary | ICD-10-CM | POA: Insufficient documentation

## 2016-10-06 DIAGNOSIS — I6521 Occlusion and stenosis of right carotid artery: Secondary | ICD-10-CM

## 2016-10-06 DIAGNOSIS — Z01812 Encounter for preprocedural laboratory examination: Secondary | ICD-10-CM

## 2016-10-06 DIAGNOSIS — Z79899 Other long term (current) drug therapy: Secondary | ICD-10-CM | POA: Insufficient documentation

## 2016-10-06 DIAGNOSIS — E785 Hyperlipidemia, unspecified: Secondary | ICD-10-CM | POA: Insufficient documentation

## 2016-10-06 DIAGNOSIS — K219 Gastro-esophageal reflux disease without esophagitis: Secondary | ICD-10-CM | POA: Insufficient documentation

## 2016-10-06 DIAGNOSIS — Z7982 Long term (current) use of aspirin: Secondary | ICD-10-CM

## 2016-10-06 DIAGNOSIS — F17200 Nicotine dependence, unspecified, uncomplicated: Secondary | ICD-10-CM

## 2016-10-06 DIAGNOSIS — F419 Anxiety disorder, unspecified: Secondary | ICD-10-CM

## 2016-10-06 HISTORY — DX: Parkinson's disease without dyskinesia, without mention of fluctuations: G20.A1

## 2016-10-06 HISTORY — DX: Peripheral vascular disease, unspecified: I73.9

## 2016-10-06 HISTORY — DX: Myoneural disorder, unspecified: G70.9

## 2016-10-06 HISTORY — DX: Acute myocardial infarction, unspecified: I21.9

## 2016-10-06 HISTORY — DX: Parkinson's disease: G20

## 2016-10-06 LAB — APTT: aPTT: 32 seconds (ref 24–36)

## 2016-10-06 LAB — COMPREHENSIVE METABOLIC PANEL
ALBUMIN: 4.3 g/dL (ref 3.5–5.0)
ALK PHOS: 62 U/L (ref 38–126)
ALT: 15 U/L — AB (ref 17–63)
AST: 32 U/L (ref 15–41)
Anion gap: 8 (ref 5–15)
BUN: 27 mg/dL — AB (ref 6–20)
CALCIUM: 9.6 mg/dL (ref 8.9–10.3)
CHLORIDE: 103 mmol/L (ref 101–111)
CO2: 25 mmol/L (ref 22–32)
CREATININE: 1.27 mg/dL — AB (ref 0.61–1.24)
GFR calc non Af Amer: 54 mL/min — ABNORMAL LOW (ref >60)
GLUCOSE: 105 mg/dL — AB (ref 65–99)
Potassium: 3.7 mmol/L (ref 3.5–5.1)
SODIUM: 136 mmol/L (ref 135–145)
Total Bilirubin: 0.7 mg/dL (ref 0.3–1.2)
Total Protein: 8.2 g/dL — ABNORMAL HIGH (ref 6.5–8.1)

## 2016-10-06 LAB — URINALYSIS, ROUTINE W REFLEX MICROSCOPIC
Bilirubin Urine: NEGATIVE
Glucose, UA: NEGATIVE mg/dL
Hgb urine dipstick: NEGATIVE
Ketones, ur: NEGATIVE mg/dL
Leukocytes, UA: NEGATIVE
Nitrite: NEGATIVE
Protein, ur: NEGATIVE mg/dL
Specific Gravity, Urine: 1.018 (ref 1.005–1.030)
pH: 5.5 (ref 5.0–8.0)

## 2016-10-06 LAB — TYPE AND SCREEN
ABO/RH(D): B POS
Antibody Screen: NEGATIVE

## 2016-10-06 LAB — CBC
HCT: 36 % — ABNORMAL LOW (ref 39.0–52.0)
HEMOGLOBIN: 12.2 g/dL — AB (ref 13.0–17.0)
MCH: 31.4 pg (ref 26.0–34.0)
MCHC: 33.9 g/dL (ref 30.0–36.0)
MCV: 92.8 fL (ref 78.0–100.0)
Platelets: 188 10*3/uL (ref 150–400)
RBC: 3.88 MIL/uL — AB (ref 4.22–5.81)
RDW: 13.7 % (ref 11.5–15.5)
WBC: 6.4 10*3/uL (ref 4.0–10.5)

## 2016-10-06 LAB — PROTIME-INR
INR: 1.04
Prothrombin Time: 13.6 seconds (ref 11.4–15.2)

## 2016-10-06 LAB — SURGICAL PCR SCREEN
MRSA, PCR: NEGATIVE
Staphylococcus aureus: POSITIVE — AB

## 2016-10-06 LAB — ABO/RH: ABO/RH(D): B POS

## 2016-10-07 ENCOUNTER — Encounter (HOSPITAL_COMMUNITY)
Admission: RE | Disposition: A | Discharge status: Home / Self Care | Payer: Self-pay | Source: Ambulatory Visit | Attending: Surgery

## 2016-10-07 ENCOUNTER — Encounter (HOSPITAL_COMMUNITY): Payer: Self-pay | Admitting: Surgery

## 2016-10-07 ENCOUNTER — Inpatient Hospital Stay (HOSPITAL_COMMUNITY): Payer: Medicare Other | Admitting: Anesthesiology

## 2016-10-07 ENCOUNTER — Inpatient Hospital Stay (HOSPITAL_COMMUNITY)
Admission: RE | Admit: 2016-10-07 | Discharge: 2016-10-12 | DRG: 039 | Disposition: A | Discharge status: Home / Self Care | Payer: Medicare Other | Source: Ambulatory Visit | Attending: Surgery | Admitting: Surgery

## 2016-10-07 DIAGNOSIS — G20 Parkinson's disease: Secondary | ICD-10-CM | POA: Diagnosis present

## 2016-10-07 DIAGNOSIS — Z6832 Body mass index (BMI) 32.0-32.9, adult: Secondary | ICD-10-CM | POA: Diagnosis not present

## 2016-10-07 DIAGNOSIS — I252 Old myocardial infarction: Secondary | ICD-10-CM

## 2016-10-07 DIAGNOSIS — R221 Localized swelling, mass and lump, neck: Secondary | ICD-10-CM | POA: Diagnosis not present

## 2016-10-07 DIAGNOSIS — E785 Hyperlipidemia, unspecified: Secondary | ICD-10-CM | POA: Diagnosis present

## 2016-10-07 DIAGNOSIS — Z87891 Personal history of nicotine dependence: Secondary | ICD-10-CM | POA: Diagnosis not present

## 2016-10-07 DIAGNOSIS — F329 Major depressive disorder, single episode, unspecified: Secondary | ICD-10-CM | POA: Diagnosis present

## 2016-10-07 DIAGNOSIS — I739 Peripheral vascular disease, unspecified: Secondary | ICD-10-CM | POA: Diagnosis present

## 2016-10-07 DIAGNOSIS — I6521 Occlusion and stenosis of right carotid artery: Principal | ICD-10-CM | POA: Diagnosis present

## 2016-10-07 DIAGNOSIS — I10 Essential (primary) hypertension: Secondary | ICD-10-CM | POA: Diagnosis present

## 2016-10-07 DIAGNOSIS — R1310 Dysphagia, unspecified: Secondary | ICD-10-CM | POA: Diagnosis not present

## 2016-10-07 DIAGNOSIS — K219 Gastro-esophageal reflux disease without esophagitis: Secondary | ICD-10-CM | POA: Diagnosis present

## 2016-10-07 DIAGNOSIS — R05 Cough: Secondary | ICD-10-CM | POA: Diagnosis not present

## 2016-10-07 DIAGNOSIS — R262 Difficulty in walking, not elsewhere classified: Secondary | ICD-10-CM

## 2016-10-07 DIAGNOSIS — F419 Anxiety disorder, unspecified: Secondary | ICD-10-CM | POA: Diagnosis present

## 2016-10-07 DIAGNOSIS — Z79899 Other long term (current) drug therapy: Secondary | ICD-10-CM

## 2016-10-07 DIAGNOSIS — I70212 Atherosclerosis of native arteries of extremities with intermittent claudication, left leg: Secondary | ICD-10-CM | POA: Diagnosis not present

## 2016-10-07 HISTORY — PX: PATCH ANGIOPLASTY: SHX6230

## 2016-10-07 HISTORY — PX: ENDARTERECTOMY: SHX5162

## 2016-10-07 SURGERY — ENDARTERECTOMY, CAROTID
Anesthesia: General | Site: Neck | Laterality: Right

## 2016-10-07 MED ORDER — SUGAMMADEX SODIUM 200 MG/2ML IV SOLN
INTRAVENOUS | Status: AC
  Filled 2016-10-07: qty 2

## 2016-10-07 MED ORDER — POTASSIUM CHLORIDE CRYS ER 20 MEQ PO TBCR: 20.0000 meq | EXTENDED_RELEASE_TABLET | Freq: Every day | ORAL | Status: DC | PRN

## 2016-10-07 MED ORDER — HYDRALAZINE HCL 20 MG/ML IJ SOLN: 5.0000 mg | INTRAMUSCULAR | Status: DC | PRN

## 2016-10-07 MED ORDER — LIDOCAINE 2% (20 MG/ML) 5 ML SYRINGE
INTRAMUSCULAR | Status: AC
  Filled 2016-10-07: qty 5

## 2016-10-07 MED ORDER — GUAIFENESIN-DM 100-10 MG/5ML PO SYRP: 15.0000 mL | ORAL_SOLUTION | ORAL | Status: DC | PRN

## 2016-10-07 MED ORDER — CARBIDOPA-LEVODOPA 25-100 MG PO TABS
1.0000 | ORAL_TABLET | Freq: Two times a day (BID) | ORAL | Status: DC
  Administered 2016-10-07 – 2016-10-12 (×10): 1 via ORAL
  Filled 2016-10-07 (×13): qty 1

## 2016-10-07 MED ORDER — PHENYLEPHRINE HCL 10 MG/ML IJ SOLN
INTRAMUSCULAR | Status: DC | PRN
  Administered 2016-10-07: 20 ug/min via INTRAVENOUS

## 2016-10-07 MED ORDER — EPHEDRINE SULFATE 50 MG/ML IJ SOLN
INTRAMUSCULAR | Status: DC | PRN
  Administered 2016-10-07 (×2): 10 mg via INTRAVENOUS
  Administered 2016-10-07 (×2): 5 mg via INTRAVENOUS
  Administered 2016-10-07: 10 mg via INTRAVENOUS

## 2016-10-07 MED ORDER — GLYCOPYRROLATE 0.2 MG/ML IJ SOLN
INTRAMUSCULAR | Status: DC | PRN
  Administered 2016-10-07: .6 mg via INTRAVENOUS
  Administered 2016-10-07: 0.4 mg via INTRAVENOUS

## 2016-10-07 MED ORDER — LACTATED RINGERS IV SOLN
INTRAVENOUS | Status: DC
  Administered 2016-10-07 (×2): via INTRAVENOUS

## 2016-10-07 MED ORDER — SODIUM CHLORIDE 0.9 % IV SOLN
INTRAVENOUS | Status: DC
  Administered 2016-10-08 (×2): via INTRAVENOUS

## 2016-10-07 MED ORDER — MUPIROCIN 2 % EX OINT
TOPICAL_OINTMENT | CUTANEOUS | Status: AC
  Filled 2016-10-07: qty 22

## 2016-10-07 MED ORDER — GABAPENTIN 100 MG PO CAPS
100.0000 mg | ORAL_CAPSULE | Freq: Every day | ORAL | Status: DC
  Administered 2016-10-08 – 2016-10-12 (×5): 100 mg via ORAL
  Filled 2016-10-07 (×5): qty 1

## 2016-10-07 MED ORDER — HEPARIN SODIUM (PORCINE) 1000 UNIT/ML IJ SOLN
INTRAMUSCULAR | Status: DC | PRN
  Administered 2016-10-07: 10000 [IU] via INTRAVENOUS

## 2016-10-07 MED ORDER — LIDOCAINE HCL (CARDIAC) 20 MG/ML IV SOLN
INTRAVENOUS | Status: DC | PRN
  Administered 2016-10-07: 30 mg via INTRAVENOUS
  Administered 2016-10-07: 100 mg via INTRAVENOUS

## 2016-10-07 MED ORDER — ESMOLOL HCL 100 MG/10ML IV SOLN
INTRAVENOUS | Status: DC | PRN
Start: 2016-10-07 — End: 2016-10-07
  Administered 2016-10-07 (×2): 30 mg via INTRAVENOUS
  Administered 2016-10-07: 50 mg via INTRAVENOUS
  Administered 2016-10-07: 30 mg via INTRAVENOUS
  Administered 2016-10-07: 40 mg via INTRAVENOUS
  Administered 2016-10-07: 20 mg via INTRAVENOUS

## 2016-10-07 MED ORDER — CHLORHEXIDINE GLUCONATE CLOTH 2 % EX PADS: 6.0000 | MEDICATED_PAD | Freq: Once | CUTANEOUS | Status: DC

## 2016-10-07 MED ORDER — SIMVASTATIN 10 MG PO TABS
10.0000 mg | ORAL_TABLET | Freq: Every day | ORAL | Status: DC
  Administered 2016-10-07 – 2016-10-11 (×5): 10 mg via ORAL
  Filled 2016-10-07 (×6): qty 1

## 2016-10-07 MED ORDER — NEOSTIGMINE METHYLSULFATE 10 MG/10ML IV SOLN
INTRAVENOUS | Status: DC | PRN
  Administered 2016-10-07: 4 mg via INTRAVENOUS

## 2016-10-07 MED ORDER — PHENYLEPHRINE 40 MCG/ML (10ML) SYRINGE FOR IV PUSH (FOR BLOOD PRESSURE SUPPORT)
PREFILLED_SYRINGE | INTRAVENOUS | Status: AC
  Filled 2016-10-07: qty 30

## 2016-10-07 MED ORDER — HEPARIN SODIUM (PORCINE) 1000 UNIT/ML IJ SOLN
INTRAMUSCULAR | Status: AC
  Filled 2016-10-07: qty 1

## 2016-10-07 MED ORDER — HYDROCHLOROTHIAZIDE 25 MG PO TABS
25.0000 mg | ORAL_TABLET | Freq: Every day | ORAL | Status: DC
  Administered 2016-10-08 – 2016-10-12 (×5): 25 mg via ORAL
  Filled 2016-10-07 (×5): qty 1

## 2016-10-07 MED ORDER — METOPROLOL TARTRATE 5 MG/5ML IV SOLN
INTRAVENOUS | Status: DC | PRN
  Administered 2016-10-07: 2 mg via INTRAVENOUS
  Administered 2016-10-07: 3 mg via INTRAVENOUS

## 2016-10-07 MED ORDER — HYDRALAZINE HCL 20 MG/ML IJ SOLN
INTRAMUSCULAR | Status: AC
  Filled 2016-10-07: qty 1

## 2016-10-07 MED ORDER — PROTAMINE SULFATE 10 MG/ML IV SOLN
INTRAVENOUS | Status: AC
  Filled 2016-10-07: qty 5

## 2016-10-07 MED ORDER — SODIUM CHLORIDE 0.9 % IV SOLN: 500.0000 mL | Freq: Once | INTRAVENOUS | Status: DC | PRN

## 2016-10-07 MED ORDER — RASAGILINE MESYLATE 0.5 MG PO TABS
0.5000 mg | ORAL_TABLET | Freq: Every day | ORAL | Status: DC
  Administered 2016-10-07 – 2016-10-11 (×5): 0.5 mg via ORAL
  Filled 2016-10-07 (×5): qty 1

## 2016-10-07 MED ORDER — ACETAMINOPHEN 325 MG RE SUPP: 325.0000 mg | RECTAL | Status: DC | PRN

## 2016-10-07 MED ORDER — ALBUMIN HUMAN 5 % IV SOLN
INTRAVENOUS | Status: DC | PRN
  Administered 2016-10-07: 15:00:00 via INTRAVENOUS

## 2016-10-07 MED ORDER — DOCUSATE SODIUM 100 MG PO CAPS
100.0000 mg | ORAL_CAPSULE | Freq: Every day | ORAL | Status: DC
  Administered 2016-10-08 – 2016-10-12 (×5): 100 mg via ORAL
  Filled 2016-10-07 (×5): qty 1

## 2016-10-07 MED ORDER — ESMOLOL HCL 100 MG/10ML IV SOLN
INTRAVENOUS | Status: AC
  Filled 2016-10-07: qty 10

## 2016-10-07 MED ORDER — METOPROLOL TARTRATE 5 MG/5ML IV SOLN: 2.0000 mg | INTRAVENOUS | Status: DC | PRN

## 2016-10-07 MED ORDER — MUPIROCIN 2 % EX OINT: 1.0000 "application " | TOPICAL_OINTMENT | Freq: Once | CUTANEOUS | Status: DC

## 2016-10-07 MED ORDER — PANTOPRAZOLE SODIUM 40 MG PO TBEC
40.0000 mg | DELAYED_RELEASE_TABLET | Freq: Every day | ORAL | Status: DC
  Administered 2016-10-08 – 2016-10-12 (×5): 40 mg via ORAL
  Filled 2016-10-07 (×5): qty 1

## 2016-10-07 MED ORDER — ONDANSETRON HCL 4 MG/2ML IJ SOLN
INTRAMUSCULAR | Status: DC | PRN
  Administered 2016-10-07: 4 mg via INTRAVENOUS

## 2016-10-07 MED ORDER — ADULT MULTIVITAMIN W/MINERALS CH
1.0000 | ORAL_TABLET | Freq: Every day | ORAL | Status: DC
  Administered 2016-10-08 – 2016-10-12 (×5): 1 via ORAL
  Filled 2016-10-07 (×5): qty 1

## 2016-10-07 MED ORDER — LIDOCAINE HCL (PF) 1 % IJ SOLN
INTRAMUSCULAR | Status: AC
  Filled 2016-10-07: qty 30

## 2016-10-07 MED ORDER — VITAMIN D (ERGOCALCIFEROL) 1.25 MG (50000 UNIT) PO CAPS: 50000.0000 [IU] | ORAL_CAPSULE | ORAL | Status: DC

## 2016-10-07 MED ORDER — DEXTROSE 5 % IV SOLN
1.5000 g | Freq: Two times a day (BID) | INTRAVENOUS | Status: AC
  Administered 2016-10-07 – 2016-10-08 (×2): 1.5 g via INTRAVENOUS
  Filled 2016-10-07 (×2): qty 1.5

## 2016-10-07 MED ORDER — ASPIRIN 81 MG PO CHEW
162.0000 mg | CHEWABLE_TABLET | Freq: Every evening | ORAL | Status: DC
  Administered 2016-10-08 – 2016-10-11 (×4): 162 mg via ORAL
  Filled 2016-10-07 (×4): qty 2

## 2016-10-07 MED ORDER — ONDANSETRON HCL 4 MG/2ML IJ SOLN
4.0000 mg | Freq: Four times a day (QID) | INTRAMUSCULAR | Status: DC | PRN
  Administered 2016-10-08: 4 mg via INTRAVENOUS
  Filled 2016-10-07: qty 2

## 2016-10-07 MED ORDER — FENTANYL CITRATE (PF) 250 MCG/5ML IJ SOLN
INTRAMUSCULAR | Status: AC
  Filled 2016-10-07: qty 5

## 2016-10-07 MED ORDER — PHENOL 1.4 % MT LIQD
1.0000 | OROMUCOSAL | Status: DC | PRN
  Administered 2016-10-09: 1 via OROMUCOSAL
  Filled 2016-10-07: qty 177

## 2016-10-07 MED ORDER — SODIUM CHLORIDE 0.9 % IV SOLN: INTRAVENOUS | Status: DC

## 2016-10-07 MED ORDER — SODIUM CHLORIDE 0.9 % IV SOLN
0.0500 ug/kg/min | INTRAVENOUS | Status: DC
  Administered 2016-10-07: .2 ug/kg/min via INTRAVENOUS
  Filled 2016-10-07: qty 5000

## 2016-10-07 MED ORDER — LOSARTAN POTASSIUM 50 MG PO TABS
100.0000 mg | ORAL_TABLET | Freq: Every day | ORAL | Status: DC
  Administered 2016-10-07 – 2016-10-11 (×5): 100 mg via ORAL
  Filled 2016-10-07 (×6): qty 2

## 2016-10-07 MED ORDER — LACTATED RINGERS IV SOLN
INTRAVENOUS | Status: DC | PRN
  Administered 2016-10-07: 14:00:00 via INTRAVENOUS

## 2016-10-07 MED ORDER — LEVOTHYROXINE SODIUM 88 MCG PO TABS
88.0000 ug | ORAL_TABLET | Freq: Every day | ORAL | Status: DC
  Administered 2016-10-08 – 2016-10-12 (×5): 88 ug via ORAL
  Filled 2016-10-07 (×5): qty 1

## 2016-10-07 MED ORDER — HEPARIN SODIUM (PORCINE) 5000 UNIT/ML IJ SOLN
INTRAMUSCULAR | Status: DC | PRN
  Administered 2016-10-07: 500 mL

## 2016-10-07 MED ORDER — PROTAMINE SULFATE 10 MG/ML IV SOLN
INTRAVENOUS | Status: DC | PRN
  Administered 2016-10-07: 40 mg via INTRAVENOUS
  Administered 2016-10-07: 10 mg via INTRAVENOUS

## 2016-10-07 MED ORDER — ALUM & MAG HYDROXIDE-SIMETH 200-200-20 MG/5ML PO SUSP: 15.0000 mL | ORAL | Status: DC | PRN

## 2016-10-07 MED ORDER — ROCURONIUM BROMIDE 100 MG/10ML IV SOLN
INTRAVENOUS | Status: DC | PRN
  Administered 2016-10-07: 50 mg via INTRAVENOUS
  Administered 2016-10-07: 30 mg via INTRAVENOUS

## 2016-10-07 MED ORDER — HEMOSTATIC AGENTS (NO CHARGE) OPTIME
TOPICAL | Status: DC | PRN
  Administered 2016-10-07: 1 via TOPICAL

## 2016-10-07 MED ORDER — CEFUROXIME SODIUM 1.5 G IJ SOLR
1.5000 g | INTRAMUSCULAR | Status: AC
  Administered 2016-10-07: 1.5 g via INTRAVENOUS
  Filled 2016-10-07: qty 1.5

## 2016-10-07 MED ORDER — FENTANYL CITRATE (PF) 100 MCG/2ML IJ SOLN
INTRAMUSCULAR | Status: DC | PRN
  Administered 2016-10-07: 50 ug via INTRAVENOUS
  Administered 2016-10-07 (×2): 100 ug via INTRAVENOUS

## 2016-10-07 MED ORDER — OXYBUTYNIN CHLORIDE 5 MG PO TABS
5.0000 mg | ORAL_TABLET | Freq: Two times a day (BID) | ORAL | Status: DC
  Administered 2016-10-08 – 2016-10-12 (×9): 5 mg via ORAL
  Filled 2016-10-07 (×12): qty 1

## 2016-10-07 MED ORDER — ROCURONIUM BROMIDE 10 MG/ML (PF) SYRINGE
PREFILLED_SYRINGE | INTRAVENOUS | Status: AC
  Filled 2016-10-07: qty 30

## 2016-10-07 MED ORDER — LABETALOL HCL 5 MG/ML IV SOLN
INTRAVENOUS | Status: DC | PRN
  Administered 2016-10-07 (×2): 5 mg via INTRAVENOUS
  Administered 2016-10-07: 10 mg via INTRAVENOUS

## 2016-10-07 MED ORDER — MORPHINE SULFATE (PF) 2 MG/ML IV SOLN: 2.0000 mg | INTRAVENOUS | Status: DC | PRN

## 2016-10-07 MED ORDER — TRAMADOL HCL 50 MG PO TABS: 50.0000 mg | ORAL_TABLET | Freq: Four times a day (QID) | ORAL | Status: DC | PRN

## 2016-10-07 MED ORDER — ONDANSETRON HCL 4 MG/2ML IJ SOLN
4.0000 mg | Freq: Once | INTRAMUSCULAR | Status: AC | PRN
  Administered 2016-10-07: 4 mg via INTRAVENOUS

## 2016-10-07 MED ORDER — ONDANSETRON HCL 4 MG/2ML IJ SOLN
INTRAMUSCULAR | Status: AC
  Filled 2016-10-07: qty 2

## 2016-10-07 MED ORDER — 0.9 % SODIUM CHLORIDE (POUR BTL) OPTIME
TOPICAL | Status: DC | PRN
  Administered 2016-10-07: 3000 mL

## 2016-10-07 MED ORDER — ONDANSETRON HCL 4 MG/2ML IJ SOLN
INTRAMUSCULAR | Status: AC
  Administered 2016-10-07: 4 mg
  Filled 2016-10-07: qty 2

## 2016-10-07 MED ORDER — CITALOPRAM HYDROBROMIDE 20 MG PO TABS
40.0000 mg | ORAL_TABLET | Freq: Every day | ORAL | Status: DC
  Administered 2016-10-07 – 2016-10-11 (×5): 40 mg via ORAL
  Filled 2016-10-07 (×5): qty 2

## 2016-10-07 MED ORDER — FOLIC ACID 1 MG PO TABS
1.0000 mg | ORAL_TABLET | Freq: Every day | ORAL | Status: DC
  Administered 2016-10-08 – 2016-10-12 (×5): 1 mg via ORAL
  Filled 2016-10-07 (×5): qty 1

## 2016-10-07 MED ORDER — PROPOFOL 10 MG/ML IV BOLUS
INTRAVENOUS | Status: DC | PRN
  Administered 2016-10-07: 120 mg via INTRAVENOUS
  Administered 2016-10-07: 80 mg via INTRAVENOUS

## 2016-10-07 MED ORDER — PROPOFOL 10 MG/ML IV BOLUS
INTRAVENOUS | Status: AC
  Filled 2016-10-07: qty 20

## 2016-10-07 MED ORDER — DULOXETINE HCL 30 MG PO CPEP
30.0000 mg | ORAL_CAPSULE | Freq: Two times a day (BID) | ORAL | Status: DC
  Administered 2016-10-08 – 2016-10-12 (×10): 30 mg via ORAL
  Filled 2016-10-07 (×10): qty 1

## 2016-10-07 MED ORDER — AMLODIPINE BESYLATE 5 MG PO TABS
5.0000 mg | ORAL_TABLET | Freq: Every day | ORAL | Status: DC
  Administered 2016-10-07 – 2016-10-11 (×5): 5 mg via ORAL
  Filled 2016-10-07 (×5): qty 1

## 2016-10-07 MED ORDER — FENTANYL CITRATE (PF) 100 MCG/2ML IJ SOLN: 25.0000 ug | INTRAMUSCULAR | Status: DC | PRN

## 2016-10-07 MED ORDER — ACETAMINOPHEN 325 MG PO TABS
325.0000 mg | ORAL_TABLET | ORAL | Status: DC | PRN
  Administered 2016-10-09 (×2): 650 mg via ORAL
  Filled 2016-10-07 (×2): qty 2

## 2016-10-07 MED ORDER — MAGNESIUM SULFATE 2 GM/50ML IV SOLN
2.0000 g | Freq: Every day | INTRAVENOUS | Status: DC | PRN
  Filled 2016-10-07: qty 50

## 2016-10-07 MED ORDER — LABETALOL HCL 5 MG/ML IV SOLN: 10.0000 mg | INTRAVENOUS | Status: DC | PRN

## 2016-10-07 MED ORDER — EPHEDRINE 5 MG/ML INJ
INTRAVENOUS | Status: AC
  Filled 2016-10-07: qty 10

## 2016-10-07 SURGICAL SUPPLY — 55 items
CANISTER SUCTION 2500CC (MISCELLANEOUS) ×3 IMPLANT
CATH EMB 3FR 40CM (CATHETERS) ×3 IMPLANT
CATH ROBINSON RED A/P 18FR (CATHETERS) ×3 IMPLANT
CATH SUCT 10FR WHISTLE TIP (CATHETERS) ×3 IMPLANT
CLIP TI MEDIUM 6 (CLIP) ×3 IMPLANT
CLIP TI WIDE RED SMALL 6 (CLIP) ×6 IMPLANT
CRADLE DONUT ADULT HEAD (MISCELLANEOUS) ×3 IMPLANT
DERMABOND ADVANCED (GAUZE/BANDAGES/DRESSINGS) ×2
DERMABOND ADVANCED .7 DNX12 (GAUZE/BANDAGES/DRESSINGS) ×1 IMPLANT
DRAIN CHANNEL 15F RND FF W/TCR (WOUND CARE) ×3 IMPLANT
DRSG COVADERM 4X6 (GAUZE/BANDAGES/DRESSINGS) ×3 IMPLANT
ELECT REM PT RETURN 9FT ADLT (ELECTROSURGICAL) ×3
ELECTRODE REM PT RTRN 9FT ADLT (ELECTROSURGICAL) ×1 IMPLANT
EVACUATOR SILICONE 100CC (DRAIN) ×3 IMPLANT
GAUZE SPONGE 4X4 12PLY STRL (GAUZE/BANDAGES/DRESSINGS) IMPLANT
GLOVE BIO SURGEON STRL SZ 6.5 (GLOVE) ×6 IMPLANT
GLOVE BIO SURGEON STRL SZ7 (GLOVE) ×3 IMPLANT
GLOVE BIO SURGEONS STRL SZ 6.5 (GLOVE) ×3
GLOVE BIOGEL PI IND STRL 6.5 (GLOVE) ×2 IMPLANT
GLOVE BIOGEL PI IND STRL 7.5 (GLOVE) ×1 IMPLANT
GLOVE BIOGEL PI INDICATOR 6.5 (GLOVE) ×4
GLOVE BIOGEL PI INDICATOR 7.5 (GLOVE) ×2
GLOVE SS BIOGEL STRL SZ 7.5 (GLOVE) ×1 IMPLANT
GLOVE SUPERSENSE BIOGEL SZ 7.5 (GLOVE) ×2
GLOVE SURG SS PI 6.5 STRL IVOR (GLOVE) ×3 IMPLANT
GLOVE SURG SS PI 7.0 STRL IVOR (GLOVE) ×3 IMPLANT
GLOVE SURG SS PI 7.5 STRL IVOR (GLOVE) ×3 IMPLANT
GOWN STRL REUS W/ TWL LRG LVL3 (GOWN DISPOSABLE) ×2 IMPLANT
GOWN STRL REUS W/ TWL XL LVL3 (GOWN DISPOSABLE) ×1 IMPLANT
GOWN STRL REUS W/TWL LRG LVL3 (GOWN DISPOSABLE) ×4
GOWN STRL REUS W/TWL XL LVL3 (GOWN DISPOSABLE) ×2
HEMOSTAT SNOW SURGICEL 2X4 (HEMOSTASIS) ×3 IMPLANT
INSERT FOGARTY SM (MISCELLANEOUS) IMPLANT
KIT BASIN OR (CUSTOM PROCEDURE TRAY) ×3 IMPLANT
KIT ROOM TURNOVER OR (KITS) ×3 IMPLANT
NEEDLE HYPO 25GX1X1/2 BEV (NEEDLE) IMPLANT
NS IRRIG 1000ML POUR BTL (IV SOLUTION) ×6 IMPLANT
PACK CAROTID (CUSTOM PROCEDURE TRAY) ×3 IMPLANT
PAD ARMBOARD 7.5X6 YLW CONV (MISCELLANEOUS) ×6 IMPLANT
PATCH VASC XENOSURE 1CMX6CM (Vascular Products) ×2 IMPLANT
PATCH VASC XENOSURE 1X6 (Vascular Products) ×1 IMPLANT
SHUNT CAROTID BYPASS 10 (VASCULAR PRODUCTS) IMPLANT
SHUNT CAROTID BYPASS 12FRX15.5 (VASCULAR PRODUCTS) IMPLANT
SPONGE INTESTINAL PEANUT (DISPOSABLE) IMPLANT
SUT ETHILON 3 0 PS 1 (SUTURE) ×3 IMPLANT
SUT PROLENE 6 0 BV (SUTURE) ×6 IMPLANT
SUT PROLENE 7 0 BV 1 (SUTURE) IMPLANT
SUT SILK 3 0 (SUTURE)
SUT SILK 3-0 18XBRD TIE 12 (SUTURE) IMPLANT
SUT VIC AB 3-0 SH 27 (SUTURE) ×6
SUT VIC AB 3-0 SH 27X BRD (SUTURE) ×3 IMPLANT
SUT VICRYL 4-0 PS2 18IN ABS (SUTURE) ×3 IMPLANT
SYR CONTROL 10ML LL (SYRINGE) IMPLANT
SYR TB 1ML LUER SLIP (SYRINGE) ×3 IMPLANT
WATER STERILE IRR 1000ML POUR (IV SOLUTION) IMPLANT

## 2016-10-07 NOTE — Transfer of Care (Signed)
Immediate Anesthesia Transfer of Care Note  Patient: [name]  Procedure(s) Performed: Procedure(s): RIGHT ENDARTERECTOMY CAROTID WITH LIGATION OF INTERNAL CAROTID (Right) PATCH ANGIOPLASTY USING XENOSURE BIOLOGIC PATCH (Right)  Patient Location: PACU  Anesthesia Type:General  Level of Consciousness: awake, alert , oriented and patient cooperative  Airway & Oxygen Therapy: Patient Spontanous Breathing and Patient connected to nasal cannula oxygen  Post-op Assessment: Post -op Vital signs reviewed and stable  Post vital signs: Reviewed and stable  Last Vitals:  Vitals:   10/07/16 1112  BP: (!) 173/62  Pulse: (!) 56  Resp: 18  Temp: 36.7 C    Last Pain:  Vitals:   10/07/16 1112  TempSrc: Oral  PainSc:       Patients Stated Pain Goal: 3 ([id number])  Complications: No apparent anesthesia complications

## 2016-10-07 NOTE — H&P (View-Only) (Signed)
HISTORY AND PHYSICAL     CC:  "stopped up vein in neck" Referring Provider:  Celene Squibb, MD  HPI: This is a 75 y.o. male who was referred to [facility] by Dr. Debara Pickett.  He was referred to him by Dr. Merlene Laughter in [place] for evaluation of carotid artery stenosis.  He was sent to a neurology group for evaluation of his Parkinson's Disease & outbursts of irritability and anger related to anxiety.  He did have a head CT scan, which revealed a large old right hemispheric stroke.  He subsequently underwent a carotid duplex, which revealed 70-99% right ICA stenosis.    Dr. Debara Pickett is optimizing medications.  He is also concerned for a murmur suspicious for aortic stenosis.  He will get an echo to confirm this.  There were also EKG changes concerning for ischemia.  Given he may need carotid endarterectomy, he will need risk stratification prior.  Given he has abnormal EKG, vascular dz, and valvular heart dz, there is high likelihood of CAD.  He recommended a [employer] as he is not able to walk on the treadmill bc of his Parkinson's.    He states that he does get cramping in his left lower leg from his calf down to his foot.  He states that he is unable to walk due to this.  He states that it happens before he even starts walking.  He states that this started over a year ago.   He says he wants to get this fixed so he can walk without falling.   Pt has a remote hx of tobacco use, but does use chewing tobacco.   He takes baby aspirin x 2 daily.  He is on a CCB and ARB for blood pressure control as well as HCTZ.  He does take Cymbalta for depression.  He is on carbidopa-levodopa as well.    Past Medical History:  Diagnosis Date  . Anxiety   . GERD (gastroesophageal reflux disease)   . Hyperlipidemia   . Hypertension     Past Surgical History:  Procedure Laterality Date  . BLADDER REPAIR     pt sts "when I had my prostatectomy they cut too much and I have an internal button I have to press in  order to release my urine"  . CATARACT EXTRACTION W/PHACO  07/28/2011   Procedure: CATARACT EXTRACTION PHACO AND INTRAOCULAR LENS PLACEMENT (IOC);  Surgeon: Elta Guadeloupe T. Gershon Crane;  Location: [facility];  Service: Ophthalmology;  Laterality: Right;  CDE: 20.26  . CATARACT EXTRACTION W/PHACO  09/08/2011   Procedure: CATARACT EXTRACTION PHACO AND INTRAOCULAR LENS PLACEMENT (IOC);  Surgeon: Elta Guadeloupe T. Gershon Crane;  Location: [facility];  Service: Ophthalmology;  Laterality: Left;  CDE:37.31  . PROSTATECTOMY    . YAG LASER APPLICATION  [id number]   Procedure: YAG LASER APPLICATION;  Surgeon: Elta Guadeloupe T. Gershon Crane, MD;  Location: [facility];  Service: Ophthalmology;  Laterality: Right;    Allergies  Allergen Reactions  . Ciprofloxacin Other (See Comments)    Patient just knows that the was told that he was allergic    Current Outpatient Prescriptions  Medication Sig Dispense Refill  . acetaminophen (TYLENOL) 650 MG CR tablet Take 650 mg by mouth every 8 (eight) hours as needed for pain.    Marland Kitchen amLODipine (NORVASC) 5 MG tablet Take 5 mg by mouth at bedtime.    . carbidopa-levodopa (SINEMET) 25-100 MG per tablet Take 1 tablet by mouth 2 (two) times daily.     . citalopram (CELEXA)  40 MG tablet Take 40 mg by mouth at bedtime.     . DULoxetine (CYMBALTA) 30 MG capsule Take 30 mg by mouth 2 (two) times daily.    . folic acid (FOLVITE) 1 MG tablet Take 1 mg by mouth daily.      Marland Kitchen gabapentin (NEURONTIN) 100 MG capsule Take 100 mg by mouth 3 (three) times daily.    . hydrochlorothiazide (HYDRODIURIL) 25 MG tablet Take 25 mg by mouth daily.    Marland Kitchen levothyroxine (SYNTHROID, LEVOTHROID) 88 MCG tablet Take 88 mcg by mouth daily before breakfast.    . losartan (COZAAR) 100 MG tablet Take 100 mg by mouth at bedtime.    . Multiple Vitamins-Minerals (MULTIVITAMIN WITH MINERALS) tablet Take 1 tablet by mouth daily.      Marland Kitchen oxybutynin (DITROPAN) 5 MG tablet Take 5 mg by mouth 2 (two) times daily.    . rasagiline (AZILECT) 0.5 MG TABS Take 0.5 mg  by mouth at bedtime.     . simvastatin (ZOCOR) 10 MG tablet Take 10 mg by mouth at bedtime.      . traMADol (ULTRAM) 50 MG tablet Take 50 mg by mouth at bedtime.    . Vitamin D, Ergocalciferol, (DRISDOL) 50000 units CAPS capsule Take 50,000 Units by mouth every 7 (seven) days.     No current facility-administered medications for this visit.     Family History  Problem Relation Age of Onset  . Cancer Mother   . Cancer Father   . Anesthesia problems Neg Hx   . Hypotension Neg Hx   . Malignant hyperthermia Neg Hx   . Pseudochol deficiency Neg Hx     Social History   Social History  . Marital status: Married    Spouse name: N/A  . Number of children: N/A  . Years of education: N/A   Occupational History  . Not on file.   Social History Main Topics  . Smoking status: Former Smoker    Packs/day: 0.50    Years: 10.00    Types: Cigarettes    Quit date: 07/24/1983  . Smokeless tobacco: Not on file  . Alcohol use No  . Drug use: No  . Sexual activity: No   Other Topics Concern  . Not on file   Social History Narrative  . No narrative on file     REVIEW OF SYSTEMS:   [X]  denotes positive finding, [ ]  denotes negative finding Cardiac  Comments:  Chest pain or chest pressure:    Shortness of breath upon exertion:    Short of breath when lying flat:    Irregular heart rhythm:        Vascular    Pain in calf, thigh, or hip brought on by ambulation: x See HPI  Pain in feet at night that wakes you up from your sleep:  x See HPI  Blood clot in your veins:    Leg swelling:  x       Pulmonary    Oxygen at home:    Productive cough:     Wheezing:         Neurologic    Sudden weakness in arms or legs:     Sudden numbness in arms or legs:     Sudden onset of difficulty speaking or slurred speech:    Temporary loss of vision in one eye:     Problems with dizziness:  x       Gastrointestinal    Blood in stool:  Vomited blood:         Genitourinary    Burning  when urinating:     Blood in urine:        Psychiatric    Major depression:  x       Hematologic    Bleeding problems:    Problems with blood clotting too easily:        Skin    Rashes or ulcers: x       Constitutional    chills x     PHYSICAL EXAMINATION:  Vitals:   09/14/16 1257 09/14/16 1306  BP: 134/77 (!) 143/84  Pulse: 96   Resp: 20   Temp: 97.5 F (36.4 C)    Body mass index is 32.34 kg/m.  General:  WDWN in NAD; vital signs documented above Gait: Not observed HENT: WNL, normocephalic Pulmonary: normal non-labored breathing , without Rales, rhonchi,  wheezing Cardiac: regular HR, with Murmur; without carotid bruits Abdomen: soft, NT, no masses Skin: without rashes Vascular Exam/Pulses:  Right Left  Radial 2+ (normal) 2+ (normal)  DP absent monophasic  Peroneal triphasic biphasic  PT triphasic Triphasic    Extremities: without ischemic changes, without Gangrene , without cellulitis; without open wounds;  Musculoskeletal: no muscle wasting or atrophy  Neurologic: A&O X 3;  No focal weakness or paresthesias are detected    Non-Invasive Vascular Imaging:   Carotid duplex outside facility 07/02/16: 1.  Severe 70-99% stenosis of the proximal right ICA secondary to heterogenous atherosclerotic plaque 2. Mild 1-49% stenosis left proximal ICA 3. Vertebral arteries are patent with antegrade flow  Carotid duplex 09/14/16: 1.  60-79% stenosis of the right proximal ICA  CT scan head 06/22/16: Large remote right hemispheric infarct involving right frontal lobe, right parietal lobe and right corona radiata/ caudate. Subsequent mild dilation right lateral ventricle.  Remote small left caudate head infarct.  Small pontine infarcts probably chronic.  Chronic microvascular changes without CT evidence large acute infarct.  Global atrophy without hydrocephalus.  No intracranial mass lesion noted on this unenhanced exam.  Post lens replacement without  orbital abnormality noted.  Mastoid air cells, middle ear cavities and majority of visualized paranasal sinuses are clear with exception of partial opacification aerated aspect superior left pterygoid plate.  IMPRESSION: Remote infarcts and atrophy as noted above without evidence of intracranial hemorrhage or CT findings to suggest large acute infarct.  Global atrophy without hydrocephalus.  Partial opacification aerated aspect superior portion left pterygoid Plate.   Pt meds includes: Statin:  Yes.   Beta Blocker:  No. Aspirin:  Yes.   ACEI:  No. ARB:  Yes.   Other Antiplatelet/Anticoagulant:  No.    ASSESSMENT/PLAN:: 75 y.o. male with 60-79% right carotid artery stenosis in setting of old right hemispheric stroke.    -pt states that he has not had any hemiparesis or speech difficulties or amaurosis fugax.  His left leg issues could be due to his stroke, but is difficult to tell. His old right hemispheric stroke could be due to his carotid stenosis, but given his cardiac issues, this could also be the source.  The pt does have appointment on Thursday, October 5th for [facility] and echocardiogram and f/u with Dr. Debara Pickett on October 16th and pending his evaluation, the pt will be scheduled for a right carotid endarterectomy on October 25th.  If he is high risk for endarterectomy, may have to consider carotid stenting.     Leontine Locket, PA-C [facility] [phone]  Clinic MD:  Pt seen and examined in conjunction with Dr. Trula Slade   I agree with the above.  I have seen and evaluated the patient.  From a vascular perspective he has 2 issues the first is right carotid stenosis which by ultrasound today was at the high and of the 60-79% category.  The patient has a CT scan which shows a right brain stroke.  He was asymptomatic from his stroke.  The timing is unknown.  He is not actively having symptoms.  Because of the degree of stenosis in the carotid as well  as his history of stroke despite it being asymptomatic, I feel that carotid endarterectomy is indicated.  However, the patient has multiple comorbidities and is in the process of being cleared for surgery from a cardiac standpoint which will be performed by Dr. Debara Pickett within the next few weeks.  I have recommended proceeding with right carotid endarterectomy which I have tentatively scheduled for Wednesday, October 25.  I discussed the risks and benefits with the patient and his brother including the risk of stroke, nerve injury, bleeding.  All their questions were answered.  The patient's second vascular issue is that of left leg claudication.  I told him that I would evaluate this with Doppler studies while he is in the hospital with his carotid.  Annamarie Major

## 2016-10-07 NOTE — Anesthesia Postprocedure Evaluation (Signed)
Anesthesia Post Note  Patient: [name]  Procedure(s) Performed: Procedure(s) (LRB): RIGHT ENDARTERECTOMY CAROTID WITH LIGATION OF INTERNAL CAROTID (Right) PATCH ANGIOPLASTY USING XENOSURE BIOLOGIC PATCH (Right)  Patient location during evaluation: PACU Anesthesia Type: General Level of consciousness: awake and alert Pain management: pain level controlled Vital Signs Assessment: post-procedure vital signs reviewed and stable Respiratory status: spontaneous breathing, nonlabored ventilation, respiratory function stable and patient connected to nasal cannula oxygen Cardiovascular status: blood pressure returned to baseline and stable Postop Assessment: no signs of nausea or vomiting Anesthetic complications: no Comments: BP is stable although did require metoprolol and labetalol post op.Marland Kitchen He is baseline neuro status on initial exam, acute post op delirium as expected given preop mental status    Last Vitals:  Vitals:   10/07/16 1112  BP: (!) 173/62  Pulse: (!) 56  Resp: 18  Temp: 36.7 C    Last Pain:  Vitals:   10/07/16 1112  TempSrc: Oral  PainSc:                  Zenaida Deed

## 2016-10-07 NOTE — Anesthesia Preprocedure Evaluation (Signed)
Anesthesia Evaluation  Patient identified by MRN, date of birth, ID band Patient awake    Reviewed: Allergy & Precautions, H&P , NPO status , Patient's Chart, lab work & pertinent test results  History of Anesthesia Complications Negative for: history of anesthetic complications  Airway Mallampati: II   Neck ROM: Full    Dental  (+) Edentulous Upper, Edentulous Lower   Pulmonary former smoker,    Pulmonary exam normal        Cardiovascular hypertension, Pt. on medications + Past MI and + Peripheral Vascular Disease   Rhythm:Regular Rate:Normal  Recent stress test with negative sign for ischemia, echo with mild AI, LV function is preserved at 60%  Low to mod risk for MACE with carotid surgery   Neuro/Psych Anxiety    GI/Hepatic GERD  Medicated and Controlled,  Endo/Other    Renal/GU      Musculoskeletal   Abdominal   Peds  Hematology   Anesthesia Other Findings   Reproductive/Obstetrics                             Anesthesia Physical  Anesthesia Plan  ASA: III  Anesthesia Plan: General   Post-op Pain Management:    Induction: Intravenous  Airway Management Planned: Oral ETT  Additional Equipment: Arterial line  Intra-op Plan:   Post-operative Plan: Extubation in OR  Informed Consent: I have reviewed the patients History and Physical, chart, labs and discussed the procedure including the risks, benefits and alternatives for the proposed anesthesia with the patient or authorized representative who has indicated his/her understanding and acceptance.   Dental advisory given  Plan Discussed with: Anesthesiologist, CRNA and Surgeon  Anesthesia Plan Comments:         Anesthesia Quick Evaluation

## 2016-10-07 NOTE — Op Note (Signed)
Patient name: Luismario Coston MRN: [id number] DOB: 1941/07/14 Sex: male  10/07/2016 Pre-operative Diagnosis: symptomatic   right carotid stenosis Post-operative diagnosis:  Same Surgeon:  Annamarie Major Assistants:  Pervis Hocking Procedure:    1.  Ligation of right internal carotid artery   2. Endarterectomy and patch angioplasty of right common and external carotid artery Anesthesia:  General Blood Loss:  See anesthesia record Specimens:  Carotid Plaque to pathology  Findings:  Functionally occluded right internal carotid artery.  There was no discernable lumen within the right internal carotid artery.  After opening the ICA as far as I could with the distal clamp in place, there was still no identifiable lumen.  There was a trickle of backbleeding.  I removed the clamp and performed an eversion endarterectomy, with approximately 7cm of distal plaque removed.  I felt that patch angioplasty could cause more harm than good.  I tried to advance a 3 Fogarty caheter distally, however it was not able to be advanced further than 7 cm, therefore I elected to ligate the internal carotid artery and perform endarterectomy of the common carotid artery at the level of the external carotid artery.  A bovine pericardial patch was used to close the arteriotomy after dividing the internal carotid artery at its origin.  Indications:  The patient was referred for right carotid stenosis.  He has a history of a large right brain stroke, for which he states he was essentially asymptomatic.  The timing of the stroke is unknown.  He has undergone ultrasound which shows a approximately 80% stenosis within his right carotid artery.  We discussed the risks and benefits of proceeding with surgery, with his history of stroke with the carotid being the likely source we have elected to proceed with endarterectomy  Procedure:  The patient was identified in the holding area and taken to [facility]  The patient was  then placed supine on the table.   General endotrachial anesthesia was administered.  The patient was prepped and draped in the usual sterile fashion.  A time out was called and antibiotics were administered.  The incision was made along the anterior border of the right sternocleidomastoid muscle.  Cautery was used to dissect through the subcutaneous tissue.  The platysma muscle was divided with cautery.  The internal jugular vein was exposed along its anterior medial border.  The common facial vein was exposed and then divided between 2-0 silk ties and metal clips.  The common carotid artery was then circumferentially exposed and encircled with an umbilical tape.  The vagus nerve was identified and protected.  Next sharp dissection was used to expose the external carotid artery and the superior thyroid artery.  The were encircled with a blue vessel loop and a 2-0 silk tie respectively.  Finally, the internal carotid was carefully dissected free.  There was a dense inflammatory reaction throughout the internal carotid artery.  I had to divide the occipital artery and fully mobilize the hypoglossal artery to get up to close to the skull base.  The artery had some soft areas at this point.  I discussed the case with Dr. early was scrubbed in and we felt that it would be safe to proceed with the operation and tack down the plaque for a good endpoint.  The patient was fully heparinized.  Next, the internal, external and common carotid artery were occluded with vascular clamps.  A #11 blade was used to make an arteriotomy which was  extended longitudinally with Potts scissors.  There did not appear to be an identifiable lumen within the internal carotid artery.  There was a trickle of backbleeding.  I then proceeded to open up the arteriotomy as far as I could up to the clamp.  The plaque appeared to go well beyond the clamp.  I then performed an eversion endarterectomy of the distal internal carotid artery and removed  approximately an additional 7 cm of plaque.  Again there was only a trickle of backbleeding at this point.  I passed a #3 Fogarty catheter which met resistance at approximately the level for the plaque had terminated.  I felt that patch and plasty including the internal carotid artery had a high risk of complication, given that this was likely an occluded distal internal carotid artery I didn't want to cause intracranial issues.  Therefore elected to ligate the internal carotid artery.  It was then transected at its origin.  I perform endarterectomy of the common carotid and external carotid artery at this point.  A bovine pericardial patch was selected the patch angioplasty was performed with running 5-0 Prolene.  After the appropriate flushing maneuvers the anastomosis was completed.  There was excellent Doppler signals in the common carotid l and external iliac artery.  50 mg protamine was given.  The patient was very oozy and therefore I elected to place a 15 Blake drain which was brought out through a stab incision and secured in place with a 20 stitch.  Once hemostasis was acceptable, the carotid sheath was reapproximated with 3-0 Vicryl, the platysma muscle was closed with 3-0 Vicryl and the skin was closed running 4-0 Vicryl.  Dermabond was applied.  The patient was then extubated and found to be moving all 4 extremities to command.  His tongue was midline.        Disposition:  To PACU in stable condition.    Theotis Burrow, M.D. [facility] [place] Office: [phone] Pager:  [phone]

## 2016-10-07 NOTE — Interval H&P Note (Signed)
History and Physical Interval Note:  10/07/2016 1:12 PM  [name]  has presented today for surgery, with the diagnosis of Right carotid artery stenosis I65.21  The various methods of treatment have been discussed with the patient and family. After consideration of risks, benefits and other options for treatment, the patient has consented to  Procedure(s): ENDARTERECTOMY CAROTID (Right) as a surgical intervention .  The patient's history has been reviewed, patient examined, no change in status, stable for surgery.  I have reviewed the patient's chart and labs.  Questions were answered to the patient's satisfaction.     Annamarie Major

## 2016-10-07 NOTE — Anesthesia Procedure Notes (Signed)
Procedure Name: Intubation Date/Time: 10/07/2016 1:54 PM Performed by: Salli Quarry Belina Mandile Pre-anesthesia Checklist: Patient identified, Emergency Drugs available, Suction available and Patient being monitored Patient Re-evaluated:Patient Re-evaluated prior to inductionOxygen Delivery Method: Circle System Utilized Preoxygenation: Pre-oxygenation with 100% oxygen Intubation Type: IV induction Ventilation: Mask ventilation without difficulty Laryngoscope Size: Mac and 3 Grade View: Grade I Tube type: Oral Tube size: 7.5 mm Number of attempts: 1 Airway Equipment and Method: Stylet Placement Confirmation: ETT inserted through vocal cords under direct vision,  positive ETCO2 and breath sounds checked- equal and bilateral Secured at: 23 cm Tube secured with: Tape Dental Injury: Teeth and Oropharynx as per pre-operative assessment

## 2016-10-08 ENCOUNTER — Encounter (HOSPITAL_COMMUNITY): Payer: Self-pay

## 2016-10-08 ENCOUNTER — Inpatient Hospital Stay (HOSPITAL_COMMUNITY): Payer: Medicare Other

## 2016-10-08 DIAGNOSIS — I70212 Atherosclerosis of native arteries of extremities with intermittent claudication, left leg: Secondary | ICD-10-CM

## 2016-10-08 LAB — CBC
HEMATOCRIT: 32.7 % — AB (ref 39.0–52.0)
HEMOGLOBIN: 10.9 g/dL — AB (ref 13.0–17.0)
MCH: 31.5 pg (ref 26.0–34.0)
MCHC: 33.3 g/dL (ref 30.0–36.0)
MCV: 94.5 fL (ref 78.0–100.0)
Platelets: 160 10*3/uL (ref 150–400)
RBC: 3.46 MIL/uL — AB (ref 4.22–5.81)
RDW: 13.9 % (ref 11.5–15.5)
WBC: 8.4 10*3/uL (ref 4.0–10.5)

## 2016-10-08 LAB — BASIC METABOLIC PANEL
Anion gap: 7 (ref 5–15)
BUN: 19 mg/dL (ref 6–20)
CHLORIDE: 103 mmol/L (ref 101–111)
CO2: 25 mmol/L (ref 22–32)
Calcium: 9.1 mg/dL (ref 8.9–10.3)
Creatinine, Ser: 1.24 mg/dL (ref 0.61–1.24)
GFR calc Af Amer: >60 mL/min (ref >60)
GFR calc non Af Amer: 56 mL/min — ABNORMAL LOW (ref >60)
GLUCOSE: 149 mg/dL — AB (ref 65–99)
POTASSIUM: 3.9 mmol/L (ref 3.5–5.1)
Sodium: 135 mmol/L (ref 135–145)

## 2016-10-08 MED ORDER — TRAMADOL HCL 50 MG PO TABS: 50.0000 mg | ORAL_TABLET | Freq: Every day | ORAL | 0 refills | Status: DC

## 2016-10-08 NOTE — Progress Notes (Signed)
  [facility] Progress Note  Came to check on patient this afternoon. He reports some coughing with breakfast and lunch today. Will order speech eval. Per RN, patient was very weak this afternoon and unable to get out of bed. Patient was able to get out of bed this morning with assistance. Neuro exam intact and unchanged from this morning. Will have PT eval.   Virgina Jock, PA-C [facility] Office: [phone] Pager: [phone] 10/08/2016 3:18 PM

## 2016-10-08 NOTE — Progress Notes (Signed)
  [facility] Progress Note  Subjective  - POD #1  Didn't sleep well last night. Says his hips hurt.   Objective Vitals:   10/08/16 0300 10/08/16 0400  BP: (!) 151/75 (!) 154/75  Pulse: 76 75  Resp: 18 16  Temp:  98 F (36.7 C)    Intake/Output Summary (Last 24 hours) at 10/08/16 0748 Last data filed at 10/08/16 0400  Gross per 24 hour  Intake             2640 ml  Output              195 ml  Net             2445 ml   Right neck swollen. Soft. Drain with mild serosanguinous output.  Tongue midline. Slight right marginal mandibular neuropraxia. 5/5 strength upper and lower extremities.   Assessment/Planning: 75 y.o. male is s/p: right carotid endarterectomy with ligation of right internal carotid artery with patch angioplasty 1 Day Post-Op   Right internal carotid was found to be completely occluded intraop and was ligated.  Watch right neck fullness. Keep drain for now. Had less than 20 cc output last shift. Was oozy during surgery.  Had some left sided weakness shortly after surgery in the PACU. Neuro intact this am.  Needs to eat breakfast this am to eval swallowing.  Ambulate this am.  Will check on patient later this afternoon. If walking and eating ok, potential d/c home today.   Alvia Grove 10/08/2016 7:48 AM --  Laboratory CBC    Component Value Date/Time   WBC 6.4 10/06/2016 0917   HGB 12.2 (L) 10/06/2016 0917   HCT 36.0 (L) 10/06/2016 0917   PLT 188 10/06/2016 0917    BMET    Component Value Date/Time   NA 136 10/06/2016 0917   K 3.7 10/06/2016 0917   CL 103 10/06/2016 0917   CO2 25 10/06/2016 0917   GLUCOSE 105 (H) 10/06/2016 0917   BUN 27 (H) 10/06/2016 0917   CREATININE 1.27 (H) 10/06/2016 0917   CALCIUM 9.6 10/06/2016 0917   GFRNONAA 54 (L) 10/06/2016 0917   GFRAA >60 10/06/2016 0917    COAG Lab Results  Component Value Date   INR 1.04 10/06/2016   No results found for: PTT  Antibiotics Anti-infectives    Start     Dose/Rate Route Frequency Ordered Stop   10/07/16 2300  cefUROXime (ZINACEF) 1.5 g in dextrose 5 % 50 mL IVPB     1.5 g 100 mL/hr over 30 Minutes Intravenous Every 12 hours 10/07/16 2214 10/08/16 2259   10/07/16 0553  cefUROXime (ZINACEF) 1.5 g in dextrose 5 % 50 mL IVPB     1.5 g 100 mL/hr over 30 Minutes Intravenous 30 min pre-op 10/07/16 0553 10/07/16 1357       Virgina Jock, PA-C [facility] Office: [phone] Pager: [phone] 10/08/2016 7:48 AM   Appears backto baseline neurologic status Right neck hematoma JP with minimal output Will see how he does with swallowing and re-evaluate this afternoon. Will likely need one more day in the hospital  [facility]

## 2016-10-08 NOTE — Care Management Note (Signed)
Case Management Note  Patient Details  Name: [name] MRN: [id number] Date of Birth: 1941-04-07  Subjective/Objective:     S/p R CEA, Jp drain to neck.  In Pacu had left side weakness which has resolved, NCM will cont to follow for dc needs.                Action/Plan:   Expected Discharge Date:                  Expected Discharge Plan:  Home/Self Care  In-House Referral:     Discharge planning Services  CM Consult  Post Acute Care Choice:    Choice offered to:     DME Arranged:    DME Agency:     HH Arranged:    HH Agency:     Status of Service:  Completed, signed off  If discussed at [employer] of Stay Meetings, dates discussed:    Additional Comments:  Zenon Mayo, RN 10/08/2016, 11:52 AM

## 2016-10-08 NOTE — Progress Notes (Signed)
VASCULAR LAB PRELIMINARY  ARTERIAL  ABI completed:ABIs and great toe pressures indicate adequate blood flow to the bilateral lower extremities, at rest.    RIGHT    LEFT    PRESSURE WAVEFORM  PRESSURE WAVEFORM  BRACHIAL IV T BRACHIAL 147 T  DP 165 B DP 149 B  AT 109 DM AT 115 DM  PT 150 B PT 144 B  PER   PER    GREAT TOE 118 NA GREAT TOE 105 NA    RIGHT LEFT  ABI 1.12 1.01     Jordyan Hardiman, RVT 10/08/2016, 11:26 AM

## 2016-10-09 ENCOUNTER — Telehealth: Payer: Self-pay | Admitting: Surgery

## 2016-10-09 NOTE — Discharge Summary (Signed)
[facility] Discharge Summary  [name] [birth year] 75 y.o. male  [id number]  Admission Date: 10/07/2016  Discharge Date: 10/12/2016  Physician: Serafina Mitchell, MD  Admission Diagnosis: Right carotid artery stenosis I65.21  HPI:   This is a 75 y.o. male was referred to [facility] by Dr. Debara Pickett.  He was referred to him by Dr. Merlene Laughter in [place] for evaluation of carotid artery stenosis.  He was sent to a neurology group for evaluation of his Parkinson's Disease & outbursts of irritability and anger related to anxiety.  He did have a head CT scan, which revealed a large old right hemispheric stroke.  He subsequently underwent a carotid duplex, which revealed 70-99% right ICA stenosis.    Dr. Debara Pickett is optimizing medications.  He is also concerned for a murmur suspicious for aortic stenosis.  He will get an echo to confirm this.  There were also EKG changes concerning for ischemia.  Given he may need carotid endarterectomy, he will need risk stratification prior.  Given he has abnormal EKG, vascular dz, and valvular heart dz, there is high likelihood of CAD.  He recommended a [employer] as he is not able to walk on the treadmill bc of his Parkinson's.    He states that he does get cramping in his left lower leg from his calf down to his foot.  He states that he is unable to walk due to this.  He states that it happens before he even starts walking.  He states that this started over a year ago.   He says he wants to get this fixed so he can walk without falling.   Pt has a remote hx of tobacco use, but does use chewing tobacco.   He takes baby aspirin x 2 daily.  He is on a CCB and ARB for blood pressure control as well as HCTZ.  He does take Cymbalta for depression.  He is on carbidopa-levodopa as well.    Hospital Course:  The patient was admitted to the hospital and taken to the operating room on 10/07/2016 and underwent ligation of right internal carotid  artery and right carotid endarterectomy and patch angioplasty of right common and external carotid artery. Intra-operatively, the right internal carotid artery was found to be functionally occluded with no discernable lumen and a trickle of back bleeding. A JP drain was placed given oozing during the case.   The patient tolerated the procedure well and was transported to the PACU in stable condition.  In the recovery room, the patient had some left upper extremity weakness. This quickly resolved.   POD 1: His neuro exam was intact except for some mild right marginal mandibular neuropraxia and slight voice hoarseness. He reported some issues with coughing during eating. SLP was ordered for evaluation. His right neck had some swelling. His JP drain was kept in place. He was able to get out of the bed to the chair without difficulty in the morning but by mid afternoon he had difficulties getting up. PT was asked to evaluate. His neuro exam remained unchanged from the morning.   The patient's JP was discontinued on POD 2. He was transferred to the floor. He was placed on dysphagia diet by SLP.   By POD 5, the patient's right neck hematoma was improving. SLP evaluated him and recommended a regular diet. He was tolerating a regular diet without difficulty and ambulating well.  He was discharged home on POD 5 in good condition.  Recent Labs  10/08/16 1007  NA 135  K 3.9  CL 103  CO2 25  GLUCOSE 149*  BUN 19  CALCIUM 9.1    Recent Labs  10/08/16 1007  WBC 8.4  HGB 10.9*  HCT 32.7*  PLT 160   No results for input(s): INR in the last 72 hours.  Discharge Instructions:   The patient is discharged to home with extensive instructions on wound care and progressive ambulation.  They are instructed not to drive or perform any heavy lifting until returning to see the physician in his office.  Discharge Instructions    CAROTID Sugery: Call MD for difficulty swallowing or speaking; weakness in  arms or legs that is a new symtom; severe headache.  If you have increased swelling in the neck and/or  are having difficulty breathing, CALL 911    Complete by:  As directed    Call MD for:  redness, tenderness, or signs of infection (pain, swelling, bleeding, redness, odor or green/yellow discharge around incision site)    Complete by:  As directed    Call MD for:  severe or increased pain, loss or decreased feeling  in affected limb(s)    Complete by:  As directed    Call MD for:  temperature >100.5    Complete by:  As directed    Discharge wound care:    Complete by:  As directed    Wash wound daily with soap and water and pat dry. Do not apply any creams or ointments on your incisions.   Driving Restrictions    Complete by:  As directed    No driving for 2 weeks   Increase activity slowly    Complete by:  As directed    Walk with assistance use walker or cane as needed   Lifting restrictions    Complete by:  As directed    No lifting for 2 weeks   Resume previous diet    Complete by:  As directed       Discharge Diagnosis:  Right carotid artery stenosis I65.21  Secondary Diagnosis: Patient Active Problem List   Diagnosis Date Noted  . Symptomatic stenosis of right carotid artery 10/07/2016  . Preoperative cardiovascular examination 09/02/2016  . Abnormal EKG 09/02/2016  . Murmur 09/02/2016  . Carotid artery stenosis with cerebral infarction ([place]) 09/02/2016   Past Medical History:  Diagnosis Date  . Anxiety   . GERD (gastroesophageal reflux disease)   . Hyperlipidemia   . Hypertension   . Myocardial infarction   . Neuromuscular disorder ([place])   . Parkinson's disease ([place])   . Peripheral vascular disease ([place])       Medication List    TAKE these medications   acetaminophen 650 MG CR tablet Commonly known as:  TYLENOL Take 1,300 mg by mouth every morning.   amLODipine 5 MG tablet Commonly known as:  NORVASC Take 5 mg by mouth at bedtime.   aspirin 81 MG  chewable tablet Chew 162 mg by mouth every evening.   carbidopa-levodopa 25-100 MG tablet Commonly known as:  SINEMET IR Take 1 tablet by mouth 2 (two) times daily.   citalopram 40 MG tablet Commonly known as:  CELEXA Take 40 mg by mouth at bedtime.   DULoxetine 30 MG capsule Commonly known as:  CYMBALTA Take 30 mg by mouth 2 (two) times daily.   folic acid 1 MG tablet Commonly known as:  FOLVITE Take 1 mg by mouth daily.   gabapentin 100  MG capsule Commonly known as:  NEURONTIN Take 100 mg by mouth daily.   hydrochlorothiazide 25 MG tablet Commonly known as:  HYDRODIURIL Take 25 mg by mouth daily.   levothyroxine 88 MCG tablet Commonly known as:  SYNTHROID, LEVOTHROID Take 88 mcg by mouth daily before breakfast.   losartan 100 MG tablet Commonly known as:  COZAAR Take 100 mg by mouth at bedtime.   multivitamin with minerals tablet Take 1 tablet by mouth daily.   oxybutynin 5 MG tablet Commonly known as:  DITROPAN Take 5 mg by mouth 2 (two) times daily.   rasagiline 0.5 MG Tabs tablet Commonly known as:  AZILECT Take 0.5 mg by mouth at bedtime.   simvastatin 10 MG tablet Commonly known as:  ZOCOR Take 10 mg by mouth at bedtime.   traMADol 50 MG tablet Commonly known as:  ULTRAM Take 1 tablet (50 mg total) by mouth at bedtime.   Vitamin D (Ergocalciferol) 50000 units Caps capsule Commonly known as:  DRISDOL Take 50,000 Units by mouth every 7 (seven) days. Sundays       Tramadol #15 No Refill  Disposition: Home  Patient's condition: is Good  Follow up: 1. Dr.  Trula Slade in 2 weeks.   Virgina Jock, PA-C [facility] [phone]  --- For [facility] use --- Instructions: Press F2 to tab through selections.  Delete question if not applicable.   Modified Rankin score at D/C (0-6): 0  IV medication needed for:  1. Hypertension: No 2. Hypotension: No  Post-op Complications: Yes  1. Post-op CVA or TIA: No  2. CN injury:  Yes  If yes: CN VII injured, marginal mandibular branch, CN IX  3. Myocardial infarction: No  4.  CHF: No  5.  Dysrhythmia (new): No  6. Wound infection: No  7. Reperfusion symptoms: No  8. Return to OR: No   Discharge medications: Statin use:  Yes If No: [ ]  For Medical reasons, [ ]  Non-compliant, [ ]  Not-indicated ASA use:  Yes  If No: [ ]  For Medical reasons, [ ]  Non-compliant, [ ]  Not-indicated Beta blocker use:  No If No: [ ]  For Medical reasons, [ ]  Non-compliant, [x ] Not-indicated ACE-Inhibitor use:  No, on losartan If No: [ ]  For Medical reasons, [ ]  Non-compliant, [ ]  Not-indicated P2Y12 Antagonist use: No, [ ]  Plavix, [ ]  Plasugrel, [ ]  Ticlopinine, [ ]  Ticagrelor, [ ]  Other, [ ]  No for medical reason, [ ]  Non-compliant, [x ] Not-indicated Anti-coagulant use:  No, [ ]  Warfarin, [ ]  Rivaroxaban, [ ]  Dabigatran, [ ]  Other, [ ]  No for medical reason, [ ]  Non-compliant, [x]  Not-indicated

## 2016-10-09 NOTE — Progress Notes (Signed)
Addendum to this morning's note:  Speech shows mild aspiration risk.  Recommend dysphagia 2 diet  Patient will need home health for physical therapy.  Potential discharge over the weekend   [facility]

## 2016-10-09 NOTE — Evaluation (Signed)
Clinical/Bedside Swallow Evaluation Patient Details  Name: [name] MRN: [id number] Date of Birth: 1941-02-16  Today's Date: 10/09/2016 Time: SLP Start Time (ACUTE ONLY): 101 SLP Stop Time (ACUTE ONLY): 1120 SLP Time Calculation (min) (ACUTE ONLY): 30 min  Past Medical History:  Past Medical History:  Diagnosis Date  . Anxiety   . GERD (gastroesophageal reflux disease)   . Hyperlipidemia   . Hypertension   . Myocardial infarction   . Neuromuscular disorder ([place])   . Parkinson's disease ([place])   . Peripheral vascular disease [facility])    Past Surgical History:  Past Surgical History:  Procedure Laterality Date  . BLADDER REPAIR     pt sts "when I had my prostatectomy they cut too much and I have an internal button I have to press in order to release my urine"  . BLADDER SURGERY     2010  pump placed   . CATARACT EXTRACTION W/PHACO  07/28/2011   Procedure: CATARACT EXTRACTION PHACO AND INTRAOCULAR LENS PLACEMENT (IOC);  Surgeon: Elta Guadeloupe T. Gershon Crane;  Location: [facility];  Service: Ophthalmology;  Laterality: Right;  CDE: 20.26  . CATARACT EXTRACTION W/PHACO  09/08/2011   Procedure: CATARACT EXTRACTION PHACO AND INTRAOCULAR LENS PLACEMENT (IOC);  Surgeon: Elta Guadeloupe T. Gershon Crane;  Location: [facility];  Service: Ophthalmology;  Laterality: Left;  CDE:37.31  . ENDARTERECTOMY Right 10/07/2016   Procedure: RIGHT ENDARTERECTOMY CAROTID WITH LIGATION OF INTERNAL CAROTID;  Surgeon: Serafina Mitchell, MD;  Location: [place];  Service: Vascular;  Laterality: Right;  . PATCH ANGIOPLASTY Right 10/07/2016   Procedure: [place];  Surgeon: Serafina Mitchell, MD;  Location: [place];  Service: Vascular;  Laterality: Right;  . PROSTATECTOMY    . YAG LASER APPLICATION  [id number]   Procedure: YAG LASER APPLICATION;  Surgeon: Elta Guadeloupe T. Gershon Crane, MD;  Location: [facility];  Service: Ophthalmology;  Laterality: Right;   HPI:  75 y.o.maleis s/p: right carotid endarterectomy with ligation of  right internal carotid artery. 2 Days Post-Op Right neck swelling stable. Tongue midline, mild right marginal mandibular neuropraxia. Voice seems more hoarse this am. Speech eval ordered today given difficulties with swallowing and coughing yesterday. This may be due to cranial nerve irritation intraop given very high carotid lesion.   Assessment / Plan / Recommendation Clinical Impression  Pt presents wtih severe pain when consuming liquids or food textures. He and his brother report coughing with regular diet textures and he further provides that regular diet items "have to be cut up small so they will go down and not hurt as much." Pt reports 10/10 pain when swallowing. Nursing aware. Pt given trials of puree and dysphagia 2 (fine soft solids) without overt s/s of aspiration. Pt continued to report pain with dyspahgai 2 items but stated that the items "were much easier to swallow." Recommend diet change to dysphagia 2 with thin liquids. ST to follow for diet tolerance and possibility for diet advancement.     Aspiration Risk  Mild aspiration risk    Diet Recommendation Dysphagia 2 (Fine chop);Thin liquid   Liquid Administration via: Straw;Cup Medication Administration: Crushed with puree Supervision: Patient able to self feed Compensations: Slow rate;Small sips/bites;Follow solids with liquid Postural Changes: Seated upright at 90 degrees    Other  Recommendations Oral Care Recommendations: Oral care BID   Follow up Recommendations  (TBD)      Frequency and Duration min 2x/week  2 weeks       Prognosis Prognosis for Safe Diet Advancement: Good  Swallow Study   General Date of Onset: 10/08/16 HPI: 75 y.o.maleis s/p: right carotid endarterectomy with ligation of right internal carotid artery. 2 Days Post-Op Right neck swelling stable. Tongue midline, mild right marginal mandibular neuropraxia. Voice seems more hoarse this am. Speech eval ordered today given difficulties with  swallowing and coughing yesterday. This may be due to cranial nerve irritation intraop given very high carotid lesion. Type of Study: Bedside Swallow Evaluation Previous Swallow Assessment: N/A Diet Prior to this Study: Regular;Thin liquids Temperature Spikes Noted: No Respiratory Status: Room air History of Recent Intubation: Yes Date extubated: 10/07/16 Behavior/Cognition: Alert;Cooperative;Pleasant mood Oral Cavity Assessment: Within Functional Limits Oral Care Completed by SLP: No Oral Cavity - Dentition: Dentures, top;Dentures, bottom Vision: Functional for self-feeding Self-Feeding Abilities: Able to feed self Patient Positioning: Upright in chair Baseline Vocal Quality: Hoarse Volitional Cough: Strong Volitional Swallow: Able to elicit    Oral/Motor/Sensory Function Overall Oral Motor/Sensory Function: Within functional limits   Ice Chips Ice chips: Not tested   Thin Liquid Thin Liquid: Within functional limits Presentation: Straw;Self Fed    Nectar Thick Nectar Thick Liquid: Not tested   Honey Thick Honey Thick Liquid: Not tested   Puree Puree: Within functional limits Presentation: Self Fed;Spoon   Solid   GO   Solid: Impaired Presentation: Self Fed;Spoon Oral Phase Functional Implications: Other (comment) Pharyngeal Phase Impairments: Other (comments) Other Comments: Pt reports 10/10 pain when swallowing and that some items (chopped and regular items) don't feel like they "don't go down." Soft solids consumed with timely swallow initiation and no report of soft solids getting stuck. Pt didn't cough with soft solids.        [name] B. Rutherford Nail M.S., [place] 10/09/2016,11:31 AM

## 2016-10-09 NOTE — Evaluation (Signed)
Physical Therapy Evaluation Patient Details Name: [name] MRN: [id number] DOB: 06-Apr-1941 Today's Date: 10/09/2016   History of Present Illness  75 yo admitted for right CEA. PMHx: Parkinsons, Right brain CVA, anxiety, HTN, LLE claudication  Clinical Impression  Pt pleasant but HOH in chair on arrival. Pt reports he cares for himself at baseline and does not walk long distances with wife and daughter at home to help if needed. Pt with decreased ability with transfers and gait who will benefit from acute therapy to maximize mobility and independence as well as HHPT. Pt encouraged to ambulate throughout the day with nursing assist.   HR 87 sats 99% on RA    Follow Up Recommendations Home health PT    Equipment Recommendations  None recommended by PT    Recommendations for Other Services       Precautions / Restrictions Precautions Precautions: Fall Restrictions Weight Bearing Restrictions: No      Mobility  Bed Mobility               General bed mobility comments: pt in chair on arrival  Transfers Overall transfer level: Needs assistance   Transfers: Sit to/from Stand Sit to Stand: Min guard         General transfer comment: cues for hand placement with increased time to achieve rise  Ambulation/Gait Ambulation/Gait assistance: Min assist Ambulation Distance (Feet): 250 Feet Assistive device: Rolling walker (2 wheeled) Gait Pattern/deviations: Step-through pattern;Decreased stride length;Trunk flexed   Gait velocity interpretation: Below normal speed for age/gender General Gait Details: cues for posture, position in RW, directional cues and safety  Stairs            Wheelchair Mobility    Modified Rankin (Stroke Patients Only)       Balance Overall balance assessment: Needs assistance   Sitting balance-Leahy Scale: Good       Standing balance-Leahy Scale: Poor                               Pertinent Vitals/Pain  Pain Assessment: 0-10 Pain Score: 3  Pain Location: neck Pain Descriptors / Indicators: Sore Pain Intervention(s): Limited activity within patient's tolerance    Home Living Family/patient expects to be discharged to:: Private residence Living Arrangements: Spouse/significant other Available Help at Discharge: Available 24 hours/day Type of Home: House Home Access: Ramped entrance     Home Layout: Two level;Able to live on main level with bedroom/bathroom Home Equipment: Bedside commode;Shower seat;Walker - 2 wheels;Cane - single point;Grab bars - tub/shower      Prior Function Level of Independence: Independent with assistive device(s)         Comments: pt uses cane or RW depending on how he feels, wife does the homemaking, pt sits to bathe and dress     Hand Dominance        Extremity/Trunk Assessment   Upper Extremity Assessment: Overall WFL for tasks assessed           Lower Extremity Assessment:  (pt with 5/5 bil LE strength , 4/5 left hamstring)      Cervical / Trunk Assessment: Kyphotic  Communication   Communication: HOH  Cognition Arousal/Alertness: Awake/alert Behavior During Therapy: WFL for tasks assessed/performed Overall Cognitive Status: Within Functional Limits for tasks assessed                      General Comments      Exercises  Assessment/Plan    PT Assessment Patient needs continued PT services  PT Problem List Decreased mobility;Decreased activity tolerance;Decreased balance;Decreased knowledge of use of DME;Pain          PT Treatment Interventions Gait training;Functional mobility training;Therapeutic exercise;Patient/family education;DME instruction;Therapeutic activities    PT Goals (Current goals can be found in the Care Plan section)  Acute Rehab PT Goals Patient Stated Goal: return home PT Goal Formulation: With patient Time For Goal Achievement: 10/16/16 Potential to Achieve Goals: Good    Frequency  Min 3X/week   Barriers to discharge Decreased caregiver support      Co-evaluation               End of Session Equipment Utilized During Treatment: Gait belt Activity Tolerance: Patient tolerated treatment well Patient left: in chair;with call bell/phone within reach;with family/visitor present Nurse Communication: Mobility status         Time: [id number] PT Time Calculation (min) (ACUTE ONLY): 18 min   Charges:   PT Evaluation $PT Eval Moderate Complexity: 1 Procedure     PT G CodesMelford Aase 10/09/2016, 10:15 AM Elwyn Reach, [place]

## 2016-10-09 NOTE — Progress Notes (Signed)
Patient transferred to 2w02 via wheelchair with Estill Bamberg, [employer] officer. Alert and oriented, VSS. Belongings transported with patient.

## 2016-10-09 NOTE — Care Management Note (Signed)
Case Management Note  Patient Details  Name: [name] MRN: [id number] Date of Birth: [birth year]  Subjective/Objective:     S/p R CEA, jp drain to neck.  Per pt eval rec HHPT, wife chose [facility], referral made to [facility] with [facility] Soc will begin 24-48 hrs post dc. Patient has a walker and a cane at home, lives with wife. He has pcp and transportation at dc and medication coverage.  NCM will cont to follow for dc needs.               Action/Plan:   Expected Discharge Date:                  Expected Discharge Plan:  [place]  In-House Referral:     Discharge planning Services  CM Consult  Post Acute Care Choice:    Choice offered to:  Spouse  DME Arranged:    DME Agency:     HH Arranged:  PT [place]:  [place]  Status of Service:  Completed, signed off  If discussed at [place] of Stay Meetings, dates discussed:    Additional Comments:  Zenon Mayo, RN 10/09/2016, 4:47 PM

## 2016-10-09 NOTE — Telephone Encounter (Signed)
Sched appt 10/19/16 at 3:00. Spoke to wife to inform them of appt.

## 2016-10-09 NOTE — Progress Notes (Signed)
    Subjective  - POD #2  Some difficulty swallowing Did not walk yesterday afternoon Feels pretty good   Physical Exam:  Neck hematoma unchanged Good strength bilaterally Tongue midline Slight marginal mandibular neuropraxia       Assessment/Plan:  POD #2  Speech eval for swallowing difficulties PT/OT consult to help with mobilization and discharge placement (home vs rehab vs SNF) D/c JP  Annamarie Major 10/09/2016 7:55 AM --  Vitals:   10/09/16 0445 10/09/16 0723  BP: 121/67 (!) 142/67  Pulse: 79 80  Resp: 17 18  Temp: 98 F (36.7 C) 98.3 F (36.8 C)    Intake/Output Summary (Last 24 hours) at 10/09/16 0755 Last data filed at 10/09/16 0445  Gross per 24 hour  Intake           1552.5 ml  Output              445 ml  Net           1107.5 ml     Laboratory CBC    Component Value Date/Time   WBC 8.4 10/08/2016 1007   HGB 10.9 (L) 10/08/2016 1007   HCT 32.7 (L) 10/08/2016 1007   PLT 160 10/08/2016 1007    BMET    Component Value Date/Time   NA 135 10/08/2016 1007   K 3.9 10/08/2016 1007   CL 103 10/08/2016 1007   CO2 25 10/08/2016 1007   GLUCOSE 149 (H) 10/08/2016 1007   BUN 19 10/08/2016 1007   CREATININE 1.24 10/08/2016 1007   CALCIUM 9.1 10/08/2016 1007   GFRNONAA 56 (L) 10/08/2016 1007   GFRAA >60 10/08/2016 1007    COAG Lab Results  Component Value Date   INR 1.04 10/06/2016   No results found for: PTT  Antibiotics Anti-infectives    Start     Dose/Rate Route Frequency Ordered Stop   10/07/16 2300  cefUROXime (ZINACEF) 1.5 g in dextrose 5 % 50 mL IVPB     1.5 g 100 mL/hr over 30 Minutes Intravenous Every 12 hours 10/07/16 2214 10/08/16 1339   10/07/16 0553  cefUROXime (ZINACEF) 1.5 g in dextrose 5 % 50 mL IVPB     1.5 g 100 mL/hr over 30 Minutes Intravenous 30 min pre-op 10/07/16 0553 10/07/16 1357       V. Leia Alf, M.D. [facility] [place] Office: [phone] Pager:  [phone]

## 2016-10-09 NOTE — Progress Notes (Signed)
  [facility] Progress Note  Subjective  - POD #2  Had some difficulty with swallowing yesterday. Says nobody got him out of bed last evening.   Objective Vitals:   10/09/16 0445 10/09/16 0723  BP: 121/67 (!) 142/67  Pulse: 79 80  Resp: 17 18  Temp: 98 F (36.7 C) 98.3 F (36.8 C)    Intake/Output Summary (Last 24 hours) at 10/09/16 0751 Last data filed at 10/09/16 0445  Gross per 24 hour  Intake           1552.5 ml  Output              445 ml  Net           1107.5 ml   Right neck swelling stable.  Tongue midline, mild right marginal mandibular neuropraxia Voice seems more hoarse this am.   Assessment/Planning: 75 y.o. male is s/p: right carotid endarterectomy with ligation of right internal carotid artery.  2 Days Post-Op   Speech eval today given difficulties with swallowing and coughing yesterday. This may be due to cranial nerve irritation intraop given very high carotid lesion.   Needs to get out of bed today. PT eval. Patient was able to get out of bed to chair yesterday morning, but felt very weak in the afternoon requiring lift.   D/c drain. Continue to monitor right neck swelling.   Alvia Grove 10/09/2016 7:51 AM --  Laboratory CBC    Component Value Date/Time   WBC 8.4 10/08/2016 1007   HGB 10.9 (L) 10/08/2016 1007   HCT 32.7 (L) 10/08/2016 1007   PLT 160 10/08/2016 1007    BMET    Component Value Date/Time   NA 135 10/08/2016 1007   K 3.9 10/08/2016 1007   CL 103 10/08/2016 1007   CO2 25 10/08/2016 1007   GLUCOSE 149 (H) 10/08/2016 1007   BUN 19 10/08/2016 1007   CREATININE 1.24 10/08/2016 1007   CALCIUM 9.1 10/08/2016 1007   GFRNONAA 56 (L) 10/08/2016 1007   GFRAA >60 10/08/2016 1007    COAG Lab Results  Component Value Date   INR 1.04 10/06/2016   No results found for: PTT  Antibiotics Anti-infectives    Start     Dose/Rate Route Frequency Ordered Stop   10/07/16 2300  cefUROXime (ZINACEF) 1.5 g in  dextrose 5 % 50 mL IVPB     1.5 g 100 mL/hr over 30 Minutes Intravenous Every 12 hours 10/07/16 2214 10/08/16 1339   10/07/16 0553  cefUROXime (ZINACEF) 1.5 g in dextrose 5 % 50 mL IVPB     1.5 g 100 mL/hr over 30 Minutes Intravenous 30 min pre-op 10/07/16 0553 10/07/16 1357       Virgina Jock, PA-C [facility] Office: [phone] Pager: [phone] 10/09/2016 7:51 AM

## 2016-10-09 NOTE — Telephone Encounter (Signed)
-----   Message from Mena Goes, RN sent at 10/08/2016  9:18 AM EDT ----- Regarding: 2 weeks post op    ----- Message ----- From: Alvia Grove, PA-C Sent: 10/08/2016   7:56 AM To: Vvs Charge Pool  S/p right carotid endarterectomy with ligation of right internal carotid artery with patch angioplasty 10/07/2016  F/u in 2 weeks with Dr. Trula Slade  Thanks Maudie Mercury

## 2016-10-10 NOTE — Progress Notes (Signed)
Subjective: Interval History: none.. No complaints. Soreness improving and right neck. Still with some difficulty swallowing.  Objective: Vital signs in last 24 hours: Temp:  [98 F (36.7 C)-98.5 F (36.9 C)] 98.2 F (36.8 C) (10/28 0429) Pulse Rate:  [82-97] 82 (10/28 0429) Resp:  [15-26] 18 (10/28 0429) BP: (115-140)/(60-87) 140/71 (10/28 0429) SpO2:  [93 %-97 %] 93 % (10/28 0429) Weight:  [210 lb 14.4 oz (95.7 kg)] 210 lb 14.4 oz (95.7 kg) (10/27 2040)  Intake/Output from previous day: 10/27 0701 - 10/28 0700 In: 600 [P.O.:600] Out: 105 [Urine:100; Drains:5] Intake/Output this shift: Total I/O In: 240 [P.O.:240] Out: 1 [Urine:1]  Some swelling in his right neck.  Lab Results:  Recent Labs  10/08/16 1007  WBC 8.4  HGB 10.9*  HCT 32.7*  PLT 160   BMET  Recent Labs  10/08/16 1007  NA 135  K 3.9  CL 103  CO2 25  GLUCOSE 149*  BUN 19  CREATININE 1.24  CALCIUM 9.1    Studies/Results: Nm Myocar Multi W/spect W/wall Motion / Ef  Result Date: 09/25/2016  The study is normal.  This is a low risk study.  The left ventricular ejection fraction is hyperdynamic (>65%).  There was no ST segment deviation noted during stress.  Normal resting and stress perfusion. No ischemia or infarction EF 67%   Anti-infectives: Anti-infectives    Start     Dose/Rate Route Frequency Ordered Stop   10/07/16 2300  cefUROXime (ZINACEF) 1.5 g in dextrose 5 % 50 mL IVPB     1.5 g 100 mL/hr over 30 Minutes Intravenous Every 12 hours 10/07/16 2214 10/08/16 1339   10/07/16 0553  cefUROXime (ZINACEF) 1.5 g in dextrose 5 % 50 mL IVPB     1.5 g 100 mL/hr over 30 Minutes Intravenous 30 min pre-op 10/07/16 0553 10/07/16 1357      Assessment/Plan: s/p Procedure(s): RIGHT ENDARTERECTOMY CAROTID WITH LIGATION OF INTERNAL CAROTID (Right) PATCH ANGIOPLASTY USING XENOSURE BIOLOGIC PATCH (Right) Stable overall. Continue working with speech therapy regarding swallowing   LOS: 3 days    Abir Eroh 10/10/2016, 12:07 PM

## 2016-10-11 NOTE — Progress Notes (Signed)
Subjective: Interval History: none.. Reports that his breakfast tray was not significant enough consistency. Was able to eat his grits. Getting choked slightly on food. Has been up walking  Objective: Vital signs in last 24 hours: Temp:  [97.8 F (36.6 C)-98.3 F (36.8 C)] 98.3 F (36.8 C) (10/29 0516) Pulse Rate:  [68-87] 80 (10/29 0516) Resp:  [18] 18 (10/29 0516) BP: (125-143)/(61-74) 125/71 (10/29 0516) SpO2:  [93 %-96 %] 96 % (10/29 0516)  Intake/Output from previous day: 10/28 0701 - 10/29 0700 In: 960 [P.O.:960] Out: 2 [Urine:2] Intake/Output this shift: No intake/output data recorded.  Right neck fullness resolving. Incision healing  Lab Results: No results for input(s): WBC, HGB, HCT, PLT in the last 72 hours. BMET No results for input(s): NA, K, CL, CO2, GLUCOSE, BUN, CREATININE, CALCIUM in the last 72 hours.  Studies/Results: Nm Myocar Multi W/spect W/wall Motion / Ef  Result Date: 09/25/2016  The study is normal.  This is a low risk study.  The left ventricular ejection fraction is hyperdynamic (>65%).  There was no ST segment deviation noted during stress.  Normal resting and stress perfusion. No ischemia or infarction EF 67%   Anti-infectives: Anti-infectives    Start     Dose/Rate Route Frequency Ordered Stop   10/07/16 2300  cefUROXime (ZINACEF) 1.5 g in dextrose 5 % 50 mL IVPB     1.5 g 100 mL/hr over 30 Minutes Intravenous Every 12 hours 10/07/16 2214 10/08/16 1339   10/07/16 0553  cefUROXime (ZINACEF) 1.5 g in dextrose 5 % 50 mL IVPB     1.5 g 100 mL/hr over 30 Minutes Intravenous 30 min pre-op 10/07/16 0553 10/07/16 1357      Assessment/Plan: s/p Procedure(s): RIGHT ENDARTERECTOMY CAROTID WITH LIGATION OF INTERNAL CAROTID (Right) PATCH ANGIOPLASTY USING XENOSURE BIOLOGIC PATCH (Right) Continued difficulty with diet. Will continue to work with speech therapy.   LOS: 4 days   Fayetta Sorenson 10/11/2016, 11:06 AM

## 2016-10-12 NOTE — Care Management Important Message (Signed)
Important Message  Patient Details  Name: [name] MRN: [id number] Date of Birth: [birth year]   Medicare Important Message Given:  Yes    Laryssa Hassing 10/12/2016, 5:06 PM

## 2016-10-12 NOTE — Progress Notes (Signed)
Attempted to see pt.  Pt d/c'd home before OT evaluation. Jinger Neighbors, [place] [id number]

## 2016-10-12 NOTE — Progress Notes (Signed)
  [facility] Progress Note  Subjective  - POD #5  Says he is not choking or coughing with food. Wants to go home.   Objective Vitals:   10/11/16 1949 10/12/16 0220  BP: 135/66 (!) 147/77  Pulse: 89 87  Resp: 16 16  Temp: 98.9 F (37.2 C) 98.2 F (36.8 C)    Intake/Output Summary (Last 24 hours) at 10/12/16 0743 Last data filed at 10/11/16 1700  Gross per 24 hour  Intake              600 ml  Output              550 ml  Net               50 ml   Right neck swelling stable.  Equal strength upper and lower extremities bilaterally.    Assessment/Planning: 75 y.o. male is s/p: right carotid endarterectomy with ligation of right internal carotid artery 5 Days Post-Op   Says he is no longer choking on food today. He did have issues over the weekend however. Will have SLP eval again today.  If cleared by SLP, d/c home today.   Alvia Grove 10/12/2016 7:43 AM --  Laboratory CBC    Component Value Date/Time   WBC 8.4 10/08/2016 1007   HGB 10.9 (L) 10/08/2016 1007   HCT 32.7 (L) 10/08/2016 1007   PLT 160 10/08/2016 1007    BMET    Component Value Date/Time   NA 135 10/08/2016 1007   K 3.9 10/08/2016 1007   CL 103 10/08/2016 1007   CO2 25 10/08/2016 1007   GLUCOSE 149 (H) 10/08/2016 1007   BUN 19 10/08/2016 1007   CREATININE 1.24 10/08/2016 1007   CALCIUM 9.1 10/08/2016 1007   GFRNONAA 56 (L) 10/08/2016 1007   GFRAA >60 10/08/2016 1007    COAG Lab Results  Component Value Date   INR 1.04 10/06/2016   No results found for: PTT  Antibiotics Anti-infectives    Start     Dose/Rate Route Frequency Ordered Stop   10/07/16 2300  cefUROXime (ZINACEF) 1.5 g in dextrose 5 % 50 mL IVPB     1.5 g 100 mL/hr over 30 Minutes Intravenous Every 12 hours 10/07/16 2214 10/08/16 1339   10/07/16 0553  cefUROXime (ZINACEF) 1.5 g in dextrose 5 % 50 mL IVPB     1.5 g 100 mL/hr over 30 Minutes Intravenous 30 min pre-op 10/07/16 0553 10/07/16 1357        Virgina Jock, PA-C [facility] Office: [phone] Pager: [phone] 10/12/2016 7:43 AM

## 2016-10-12 NOTE — Progress Notes (Signed)
Speech Language Pathology Treatment: Dysphagia  Patient Details Name: [name] MRN: [id number] DOB: 1941-05-18 Today's Date: 10/12/2016 Time: 5003-7048 SLP Time Calculation (min) (ACUTE ONLY): 15 min  Assessment / Plan / Recommendation Clinical Impression  Potential D/C home today.  Pt continues with some odynophagia with swallowing, but comfort has improved and he reports no choking/coughing with POs.  Observed with consumption of regular solids, soft solids, thin liquids.  There were no overt s/s of aspiration; requires softening of solids for comfort, but overall toleration is better.  Vocal quality improved.  Lungs are clear; pt afebrile.  Recommend regular diet, thin liquids upon D/C.  No further SLP f/u warranted.  D/W pt, RN.  Our services will sign off.    HPI HPI: 75 y.o.maleis s/p: right carotid endarterectomy with ligation of right internal carotid artery. 2 Days Post-Op Right neck swelling stable. Tongue midline, mild right marginal mandibular neuropraxia. Voice seems more hoarse this am. Speech eval ordered today given difficulties with swallowing and coughing yesterday. This may be due to cranial nerve irritation intraop given very high carotid lesion.      SLP Plan  All goals met     Recommendations  Diet recommendations: Regular;Thin liquid Liquids provided via: Cup;Straw Medication Administration: Whole meds with puree Supervision: Patient able to self feed Postural Changes and/or Swallow Maneuvers: Out of bed for meals                Oral Care Recommendations: Oral care BID Plan: All goals met       GO               [name], [place] CCC/SLP Pager [phone]  [name] 10/12/2016, 9:48 AM

## 2016-10-12 NOTE — Care Management Note (Signed)
Case Management Note Previous CM note initiated by Zenon Mayo, RN-10/09/2016, 4:47 PM   Patient Details  Name: [name] MRN: [id number] Date of Birth: 1941/09/15  Subjective/Objective:     S/p R CEA, jp drain to neck.  Per pt eval rec HHPT, wife chose [facility], referral made to [facility] with [facility]. Soc will begin 24-48 hrs post dc. Patient has a walker and a cane at home, lives with wife. He has pcp and transportation at dc and medication coverage.  NCM will cont to follow for dc needs.               Action/Plan:   Expected Discharge Date:    10/12/16              Expected Discharge Plan:  [place]  In-House Referral:     Discharge planning Services  CM Consult  Post Acute Care Choice:  Home Health, Durable Medical Equipment Choice offered to:  Spouse  DME Arranged:  Bedside commode DME Agency:  [place]:  PT [facility] Agency:  [place]  Status of Service:  Completed, signed off  If discussed at [place] of Stay Meetings, dates discussed:    Additional Comments:  10/12/16- 1245- Neils [place] RN, CM- pt for d/c home today- have notified Jermaine with [facility] for [facility] need- however pt discharged prior to it being delivered to room- referral was also made to [facility]- for [facility]- but no HH orders placed- per bedside RN- pt moving around well- informed Santiago Glad with [facility] to cancel [facility] referral.   Dawayne Patricia, RN 10/12/2016, 2:01 PM [phone]

## 2016-10-14 ENCOUNTER — Encounter: Payer: Self-pay | Admitting: Surgery

## 2016-10-19 ENCOUNTER — Encounter: Payer: Self-pay | Admitting: Surgery

## 2016-10-19 ENCOUNTER — Ambulatory Visit (INDEPENDENT_AMBULATORY_CARE_PROVIDER_SITE_OTHER): Payer: Medicare Other | Admitting: Surgery

## 2016-10-19 VITALS — BP 143/81 | HR 106 | Temp 98.9°F | Resp 16 | Ht 68.5 in

## 2016-10-19 DIAGNOSIS — I6521 Occlusion and stenosis of right carotid artery: Secondary | ICD-10-CM

## 2016-10-19 NOTE — Progress Notes (Signed)
POST OPERATIVE OFFICE NOTE    CC:  F/u for surgery  HPI:  This is a 75 y.o. male who is s/p Right carotid ligation.  He is doing well.  His chief complaint is HA, which he has had on off chronically.  He reports no vision changes and no weakness in his extremities.    Allergies  Allergen Reactions  . Ciprofloxacin Other (See Comments)    Patient just knows that the was told that he was allergic    Current Outpatient Prescriptions  Medication Sig Dispense Refill  . acetaminophen (TYLENOL) 650 MG CR tablet Take 1,300 mg by mouth every morning.     Marland Kitchen amLODipine (NORVASC) 5 MG tablet Take 5 mg by mouth at bedtime.    Marland Kitchen aspirin 81 MG chewable tablet Chew 162 mg by mouth every evening.    . carbidopa-levodopa (SINEMET) 25-100 MG per tablet Take 1 tablet by mouth 2 (two) times daily.     . citalopram (CELEXA) 40 MG tablet Take 40 mg by mouth at bedtime.     . DULoxetine (CYMBALTA) 30 MG capsule Take 30 mg by mouth 2 (two) times daily.    . folic acid (FOLVITE) 1 MG tablet Take 1 mg by mouth daily.      Marland Kitchen gabapentin (NEURONTIN) 100 MG capsule Take 100 mg by mouth daily.     . hydrochlorothiazide (HYDRODIURIL) 25 MG tablet Take 25 mg by mouth daily.    Marland Kitchen levothyroxine (SYNTHROID, LEVOTHROID) 88 MCG tablet Take 88 mcg by mouth daily before breakfast.    . losartan (COZAAR) 100 MG tablet Take 100 mg by mouth at bedtime.    . Multiple Vitamins-Minerals (MULTIVITAMIN WITH MINERALS) tablet Take 1 tablet by mouth daily.      Marland Kitchen oxybutynin (DITROPAN) 5 MG tablet Take 5 mg by mouth 2 (two) times daily.    . rasagiline (AZILECT) 0.5 MG TABS Take 0.5 mg by mouth at bedtime.     . simvastatin (ZOCOR) 10 MG tablet Take 10 mg by mouth at bedtime.      . traMADol (ULTRAM) 50 MG tablet Take 1 tablet (50 mg total) by mouth at bedtime. 15 tablet 0  . Vitamin D, Ergocalciferol, (DRISDOL) 50000 units CAPS capsule Take 50,000 Units by mouth every 7 (seven) days. Sundays     No current facility-administered  medications for this visit.      ROS:  See HPI  Physical Exam:  Vitals:   10/19/16 1523 10/19/16 1524  BP: (!) 159/99 (!) 143/81  Pulse: (!) 106 (!) 106  Resp:    Temp:     Pre-op scan Non-Invasive Vascular Imaging:   Carotid duplex outside facility 07/02/16: 1.  Severe 70-99% stenosis of the proximal right ICA secondary to heterogenous atherosclerotic plaque 2. Mild 1-49% stenosis left proximal ICA 3. Vertebral arteries are patent with antegrade flow  Carotid duplex 09/14/16: 1.  60-79% stenosis of the right proximal ICA  Incision:  Healing well  Extremities:  Grip 5/5, palpable radial pulses No tongue deviation, right facial droop chronic since CVA  Assessment/Plan:  This is a 75 y.o. male who is s/p: Right CEA with ICA ligation.  We will schedule a repeat carotid duplex in 6 months.  Activity as tolerates.  We recommended that he not drive for a few more weeks.   Theda Sers EMMA [facility] PA-C [facility] [phone]  Clinic MD:  Pt seen and examined with Dr. Trula Slade  I agree with the above.  I have  seen and evaluated the patient.  He had ligation of his right carotid artery on 10/07/2016.  The patient has had no new neurological symptoms.  A eversion endarterectomy and being done with removing approximately 10 cm a plaque extending up into the skull base.  There was a trickle of back bleeding but I did not feel it was safe to proceed with endarterectomy.  Therefore the internal carotid artery was ligated at the origin.  Patch angioplasty of the common and external carotid artery were performed.  The patient stated yesterday sedation the hospital secondary to swallowing issues.  He is now at home.  According to his brother he is back to his baseline.  The patient was scheduled for follow-up in 6 months with repeat carotid ultrasound.  Annamarie Major

## 2016-10-19 NOTE — Progress Notes (Signed)
Vitals:   10/19/16 1522 10/19/16 1523  BP: (!) 177/96 (!) 159/99  Pulse: (!) 111 (!) 106  Resp: 16   Temp: 98.9 F (37.2 C)   Height: 5' 8.5" (1.74 m)

## 2017-01-06 ENCOUNTER — Ambulatory Visit: Payer: Self-pay | Admitting: Orthopaedic Surgery

## 2017-01-11 ENCOUNTER — Ambulatory Visit (INDEPENDENT_AMBULATORY_CARE_PROVIDER_SITE_OTHER): Payer: PPO

## 2017-01-11 ENCOUNTER — Ambulatory Visit (INDEPENDENT_AMBULATORY_CARE_PROVIDER_SITE_OTHER): Payer: PPO | Admitting: Orthopedic Surgery

## 2017-01-11 ENCOUNTER — Encounter: Payer: Self-pay | Admitting: Orthopedic Surgery

## 2017-01-11 VITALS — BP 140/106 | HR 109 | Wt 210.0 lb

## 2017-01-11 DIAGNOSIS — M5442 Lumbago with sciatica, left side: Secondary | ICD-10-CM

## 2017-01-11 MED ORDER — PREDNISONE 10 MG (48) PO TBPK: ORAL_TABLET | Freq: Every day | ORAL | 0 refills | Status: DC

## 2017-01-11 NOTE — Progress Notes (Signed)
Chief Complaint  Patient presents with  . Back Pain    left sided back pain, radiates down left leg     76 year old male presents with two-week history of lower back pain radiating down his left leg unrelieved by Tylenol associated with some pain in the left foot. He denies any trauma it's a dull aching pain moderate in severity     Review of Systems  Constitutional: Negative for fever, malaise/fatigue and weight loss.  Genitourinary:       Chronic urinary issues had surgery versus a adult pull-up  Neurological: Positive for tingling and focal weakness.      Past Medical History:  Diagnosis Date  . Anxiety   . GERD (gastroesophageal reflux disease)   . Hyperlipidemia   . Hypertension   . Myocardial infarction   . Neuromuscular disorder ([place])   . Parkinson's disease ([place])   . Peripheral vascular disease [facility])    Past Surgical History:  Procedure Laterality Date  . BLADDER REPAIR     pt sts "when I had my prostatectomy they cut too much and I have an internal button I have to press in order to release my urine"  . BLADDER SURGERY     2010  pump placed   . CATARACT EXTRACTION W/PHACO  07/28/2011   Procedure: CATARACT EXTRACTION PHACO AND INTRAOCULAR LENS PLACEMENT (IOC);  Surgeon: Elta Guadeloupe T. Gershon Crane;  Location: [facility];  Service: Ophthalmology;  Laterality: Right;  CDE: 20.26  . CATARACT EXTRACTION W/PHACO  09/08/2011   Procedure: CATARACT EXTRACTION PHACO AND INTRAOCULAR LENS PLACEMENT (IOC);  Surgeon: Elta Guadeloupe T. Gershon Crane;  Location: [facility];  Service: Ophthalmology;  Laterality: Left;  CDE:37.31  . ENDARTERECTOMY Right 10/07/2016   Procedure: RIGHT ENDARTERECTOMY CAROTID WITH LIGATION OF INTERNAL CAROTID;  Surgeon: Serafina Mitchell, MD;  Location: [place];  Service: Vascular;  Laterality: Right;  . PATCH ANGIOPLASTY Right 10/07/2016   Procedure: [place];  Surgeon: Serafina Mitchell, MD;  Location: [place];  Service: Vascular;  Laterality: Right;  .  PROSTATECTOMY    . YAG LASER APPLICATION  [id number]   Procedure: YAG LASER APPLICATION;  Surgeon: Elta Guadeloupe T. Gershon Crane, MD;  Location: [facility];  Service: Ophthalmology;  Laterality: Right;   Family History  Problem Relation Age of Onset  . Cancer Mother   . Cancer Father   . Anesthesia problems Neg Hx   . Hypotension Neg Hx   . Malignant hyperthermia Neg Hx   . Pseudochol deficiency Neg Hx    BP (!) 140/106   Pulse (!) 109   Wt 210 lb (95.3 kg)   BMI 31.47 kg/m  Physical Exam  Constitutional: He is oriented to person, place, and time. He appears well-developed and well-nourished. No distress.  Cardiovascular: Intact distal pulses.   Musculoskeletal:  He is using a cane for support that is chronic she also has a left lower extremity antalgic gait  He has a positive straight leg raise at 40 he has decreased sensation in the dorsum of his foot he has increased pain with lateral leg lower back palpable tenderness in the lower back with decreased range of motion in the lower back and he stands with slight flexion  His dorsiflexors and plantar flexors show normal strength skin shows some discoloration but no laceration has good distal pulse  Neurological: He is alert and oriented to person, place, and time.  Skin: He is not diaphoretic.  Psychiatric: He has a normal mood and affect.  Right leg range of motion stability strength and skin normal  X-rays show degenerative scoliosis lumbar spine  Impression  Encounter Diagnosis  Name Primary?  . Left-sided low back pain with left-sided sciatica, unspecified chronicity Yes    Meds ordered this encounter  Medications  . predniSONE (STERAPRED UNI-PAK 48 TAB) 10 MG (48) TBPK tablet    Sig: Take by mouth daily. Take 12 days as directed    Dispense:  48 tablet    Refill:  0   Follow-up 2 weeks if no improvement MRI

## 2017-02-01 ENCOUNTER — Encounter: Payer: Self-pay | Admitting: Orthopedic Surgery

## 2017-02-01 ENCOUNTER — Ambulatory Visit (INDEPENDENT_AMBULATORY_CARE_PROVIDER_SITE_OTHER): Payer: PPO | Admitting: Orthopedic Surgery

## 2017-02-01 DIAGNOSIS — M5442 Lumbago with sciatica, left side: Secondary | ICD-10-CM | POA: Diagnosis not present

## 2017-02-01 MED ORDER — NAPROXEN 500 MG PO TABS: 500.0000 mg | ORAL_TABLET | Freq: Two times a day (BID) | ORAL | 0 refills | Status: DC

## 2017-02-01 NOTE — Progress Notes (Signed)
FOLLOW UP VISIT   Patient ID: [name], male   DOB: 11/15/1941, 76 y.o.   MRN: [id number]  Chief Complaint  Patient presents with  . Follow-up    left leg pain    HPI [name] is a 76 y.o. male.   HPI  76 year old male with lower back pain and left-sided sciatica treated with prednisone Dosepak no improvement  Review of Systems Review of Systems   Bowel bladder function remains intact  Physical Exam  Previous exam showed no numbness in the leg just pain   MEDICAL DECISION MAKING  DATA   Previous note  DIAGNOSIS  Encounter Diagnosis  Name Primary?  . Left-sided low back pain with left-sided sciatica, unspecified chronicity Yes     PLAN(RISK)    Try naproxen and get a CT scan of his back to prepare for epidural injection

## 2017-02-23 ENCOUNTER — Ambulatory Visit (HOSPITAL_COMMUNITY): Payer: PPO

## 2017-03-02 ENCOUNTER — Ambulatory Visit: Payer: Self-pay | Admitting: Orthopedic Surgery

## 2017-03-03 ENCOUNTER — Ambulatory Visit (HOSPITAL_COMMUNITY): Payer: PPO

## 2017-03-09 ENCOUNTER — Ambulatory Visit: Payer: Self-pay | Admitting: Orthopedic Surgery

## 2017-03-15 ENCOUNTER — Ambulatory Visit (HOSPITAL_COMMUNITY)
Admission: RE | Admit: 2017-03-15 | Discharge: 2017-03-15 | Disposition: A | Discharge status: Home / Self Care | Payer: PPO | Source: Ambulatory Visit | Attending: Orthopedic Surgery | Admitting: Orthopedic Surgery

## 2017-03-15 DIAGNOSIS — I708 Atherosclerosis of other arteries: Secondary | ICD-10-CM | POA: Diagnosis not present

## 2017-03-15 DIAGNOSIS — M8938 Hypertrophy of bone, other site: Secondary | ICD-10-CM | POA: Diagnosis not present

## 2017-03-15 DIAGNOSIS — M47898 Other spondylosis, sacral and sacrococcygeal region: Secondary | ICD-10-CM | POA: Insufficient documentation

## 2017-03-15 DIAGNOSIS — M5442 Lumbago with sciatica, left side: Secondary | ICD-10-CM | POA: Diagnosis not present

## 2017-03-15 DIAGNOSIS — I700 Atherosclerosis of aorta: Secondary | ICD-10-CM | POA: Insufficient documentation

## 2017-03-15 DIAGNOSIS — M5136 Other intervertebral disc degeneration, lumbar region: Secondary | ICD-10-CM | POA: Diagnosis not present

## 2017-03-16 DIAGNOSIS — G894 Chronic pain syndrome: Secondary | ICD-10-CM | POA: Diagnosis not present

## 2017-03-16 DIAGNOSIS — N310 Uninhibited neuropathic bladder, not elsewhere classified: Secondary | ICD-10-CM | POA: Diagnosis not present

## 2017-03-16 DIAGNOSIS — G214 Vascular parkinsonism: Secondary | ICD-10-CM | POA: Diagnosis not present

## 2017-03-16 DIAGNOSIS — F0150 Vascular dementia without behavioral disturbance: Secondary | ICD-10-CM | POA: Diagnosis not present

## 2017-03-16 DIAGNOSIS — E538 Deficiency of other specified B group vitamins: Secondary | ICD-10-CM | POA: Diagnosis not present

## 2017-03-16 DIAGNOSIS — M5416 Radiculopathy, lumbar region: Secondary | ICD-10-CM | POA: Diagnosis not present

## 2017-03-16 DIAGNOSIS — C61 Malignant neoplasm of prostate: Secondary | ICD-10-CM | POA: Diagnosis not present

## 2017-03-16 DIAGNOSIS — M25552 Pain in left hip: Secondary | ICD-10-CM | POA: Diagnosis not present

## 2017-03-16 DIAGNOSIS — M1009 Idiopathic gout, multiple sites: Secondary | ICD-10-CM | POA: Diagnosis not present

## 2017-03-16 DIAGNOSIS — I10 Essential (primary) hypertension: Secondary | ICD-10-CM | POA: Diagnosis not present

## 2017-03-16 DIAGNOSIS — E039 Hypothyroidism, unspecified: Secondary | ICD-10-CM | POA: Diagnosis not present

## 2017-03-16 DIAGNOSIS — R2681 Unsteadiness on feet: Secondary | ICD-10-CM | POA: Diagnosis not present

## 2017-03-22 ENCOUNTER — Ambulatory Visit (INDEPENDENT_AMBULATORY_CARE_PROVIDER_SITE_OTHER): Payer: PPO | Admitting: Orthopedic Surgery

## 2017-03-22 ENCOUNTER — Encounter: Payer: Self-pay | Admitting: Orthopedic Surgery

## 2017-03-22 DIAGNOSIS — M48061 Spinal stenosis, lumbar region without neurogenic claudication: Secondary | ICD-10-CM

## 2017-03-22 NOTE — Progress Notes (Signed)
Chief Complaint  Patient presents with  . Follow-up    REVIEW CT L SPINE   76 year old male presented initially with lower back pain radiating down his left leg. He was treated with steroid Dosepak naproxen Tylenol did not improve. Eventually got a CT scan which shows multilevel degenerative disc disease and facet arthritis with severe spondylosis  no improvement in his overall symptoms  The CT scan shows multilevel degenerative disc disease with moderately severe facet arthritis bilaterally at L4 and 5 are bulging disc at this level. There is disease from L1 on the way through S1  SPINAL STENOSIS   Recommend consult with neurosurgery

## 2017-04-07 ENCOUNTER — Encounter: Payer: Self-pay | Admitting: Surgery

## 2017-04-13 ENCOUNTER — Other Ambulatory Visit: Payer: Self-pay | Admitting: Surgery

## 2017-04-13 DIAGNOSIS — I63239 Cerebral infarction due to unspecified occlusion or stenosis of unspecified carotid arteries: Secondary | ICD-10-CM

## 2017-04-19 ENCOUNTER — Ambulatory Visit (HOSPITAL_COMMUNITY): Payer: PPO

## 2017-04-19 ENCOUNTER — Ambulatory Visit: Payer: PPO | Admitting: Surgery

## 2017-04-19 DIAGNOSIS — M4727 Other spondylosis with radiculopathy, lumbosacral region: Secondary | ICD-10-CM | POA: Diagnosis not present

## 2017-04-19 DIAGNOSIS — M5416 Radiculopathy, lumbar region: Secondary | ICD-10-CM | POA: Diagnosis not present

## 2017-04-21 ENCOUNTER — Other Ambulatory Visit: Payer: Self-pay | Admitting: Physician Assistant

## 2017-04-21 DIAGNOSIS — M5416 Radiculopathy, lumbar region: Secondary | ICD-10-CM

## 2017-04-22 ENCOUNTER — Other Ambulatory Visit: Payer: Self-pay | Admitting: Physician Assistant

## 2017-04-22 DIAGNOSIS — M5416 Radiculopathy, lumbar region: Secondary | ICD-10-CM

## 2017-04-29 ENCOUNTER — Ambulatory Visit
Admission: RE | Admit: 2017-04-29 | Discharge: 2017-04-29 | Disposition: A | Discharge status: Home / Self Care | Payer: PPO | Source: Ambulatory Visit | Attending: Physician Assistant | Admitting: Physician Assistant

## 2017-04-29 DIAGNOSIS — M5416 Radiculopathy, lumbar region: Secondary | ICD-10-CM

## 2017-04-29 DIAGNOSIS — M5126 Other intervertebral disc displacement, lumbar region: Secondary | ICD-10-CM | POA: Diagnosis not present

## 2017-04-29 MED ORDER — ONDANSETRON 8 MG PO TBDP
8.0000 mg | ORAL_TABLET | Freq: Once | ORAL | Status: AC
  Administered 2017-04-29: 8 mg via ORAL

## 2017-04-29 MED ORDER — IOPAMIDOL (ISOVUE-M 200) INJECTION 41%
15.0000 mL | Freq: Once | INTRAMUSCULAR | Status: AC
  Administered 2017-04-29: 15 mL via INTRATHECAL

## 2017-04-29 MED ORDER — DIAZEPAM 5 MG PO TABS
5.0000 mg | ORAL_TABLET | Freq: Once | ORAL | Status: AC
  Administered 2017-04-29: 5 mg via ORAL

## 2017-04-29 MED ORDER — ONDANSETRON HCL 4 MG/2ML IJ SOLN: 4.0000 mg | Freq: Once | INTRAMUSCULAR | Status: DC

## 2017-04-29 NOTE — Progress Notes (Signed)
Patient states he has been off Cymbalta and Tramadol for at least the past two days.  Brita Romp, RN

## 2017-04-29 NOTE — Progress Notes (Signed)
04/29/17 @1407 : patient vomited small amount brown emesis after drinking a cup of Coke.  Medicated for continued nausea.  Brita Romp, RN

## 2017-04-29 NOTE — Discharge Instructions (Signed)
Myelogram Discharge Instructions  1. Go home and rest quietly for the next 24 hours.  It is important to lie flat for the next 24 hours.  Get up only to go to the restroom.  You may lie in the bed or on a couch on your back, your stomach, your left side or your right side.  You may have one pillow under your head.  You may have pillows between your knees while you are on your side or under your knees while you are on your back.  2. DO NOT drive today.  Recline the seat as far back as it will go, while still wearing your seat belt, on the way home.  3. You may get up to go to the bathroom as needed.  You may sit up for 10 minutes to eat.  You may resume your normal diet and medications unless otherwise indicated.  Drink lots of extra fluids today and tomorrow.  4. The incidence of headache, nausea, or vomiting is about 5% (one in 20 patients).  If you develop a headache, lie flat and drink plenty of fluids until the headache goes away.  Caffeinated beverages may be helpful.  If you develop severe nausea and vomiting or a headache that does not go away with flat bed rest, call [phone].  5. You may resume normal activities after your 24 hours of bed rest is over; however, do not exert yourself strongly or do any heavy lifting tomorrow. If when you get up you have a headache when standing, go back to bed and force fluids for another 24 hours.  6. Call your physician for a follow-up appointment.  The results of your myelogram will be sent directly to your physician by the following day.  7. If you have any questions or if complications develop after you arrive home, please call [phone].  Discharge instructions have been explained to the patient.  The patient, or the person responsible for the patient, fully understands these instructions.       May resume Cymbalta and Tramadol on Apr 30, 2017, after 1:00 pm.

## 2017-05-07 ENCOUNTER — Telehealth: Payer: Self-pay | Admitting: Orthopedic Surgery

## 2017-05-07 NOTE — Telephone Encounter (Signed)
Patient/wife called to inquire about results (CT scan of lumbar spine), as states has not heard anything since it was done. Appears to have been ordered by another office, not Dr Aline Brochure?

## 2017-05-11 ENCOUNTER — Other Ambulatory Visit: Payer: Self-pay | Admitting: Orthopedic Surgery

## 2017-05-11 NOTE — Telephone Encounter (Signed)
WILL NEED TO CALL ORDERING PROVIDER, WE REFERRED TO NEUROSURGERY

## 2017-05-13 NOTE — Telephone Encounter (Signed)
Patient made aware it was not our office, it was neurosurgeon.

## 2017-06-07 ENCOUNTER — Other Ambulatory Visit: Payer: Self-pay | Admitting: Orthopedic Surgery

## 2017-06-22 DIAGNOSIS — Z961 Presence of intraocular lens: Secondary | ICD-10-CM | POA: Diagnosis not present

## 2017-06-23 DIAGNOSIS — M545 Low back pain: Secondary | ICD-10-CM | POA: Diagnosis not present

## 2017-07-02 ENCOUNTER — Encounter: Payer: Self-pay | Admitting: Surgery

## 2017-07-09 ENCOUNTER — Other Ambulatory Visit: Payer: Self-pay | Admitting: Orthopedic Surgery

## 2017-07-12 ENCOUNTER — Ambulatory Visit: Payer: PPO | Admitting: Surgery

## 2017-07-12 ENCOUNTER — Encounter (HOSPITAL_COMMUNITY): Payer: PPO

## 2017-08-11 ENCOUNTER — Encounter: Payer: Self-pay | Admitting: Surgery

## 2017-08-11 ENCOUNTER — Other Ambulatory Visit: Payer: Self-pay | Admitting: Orthopedic Surgery

## 2017-08-19 DIAGNOSIS — M5430 Sciatica, unspecified side: Secondary | ICD-10-CM | POA: Diagnosis not present

## 2017-08-19 DIAGNOSIS — M545 Low back pain: Secondary | ICD-10-CM | POA: Diagnosis not present

## 2017-08-19 DIAGNOSIS — M542 Cervicalgia: Secondary | ICD-10-CM | POA: Diagnosis not present

## 2017-08-19 DIAGNOSIS — M479 Spondylosis, unspecified: Secondary | ICD-10-CM | POA: Diagnosis not present

## 2017-08-19 DIAGNOSIS — Z6829 Body mass index (BMI) 29.0-29.9, adult: Secondary | ICD-10-CM | POA: Diagnosis not present

## 2017-08-23 ENCOUNTER — Encounter (HOSPITAL_COMMUNITY): Payer: PPO

## 2017-08-23 ENCOUNTER — Ambulatory Visit: Payer: PPO | Admitting: Surgery

## 2017-08-30 DIAGNOSIS — L03011 Cellulitis of right finger: Secondary | ICD-10-CM | POA: Diagnosis not present

## 2017-08-30 DIAGNOSIS — Z6829 Body mass index (BMI) 29.0-29.9, adult: Secondary | ICD-10-CM | POA: Diagnosis not present

## 2017-09-27 DIAGNOSIS — R2681 Unsteadiness on feet: Secondary | ICD-10-CM | POA: Diagnosis not present

## 2017-09-27 DIAGNOSIS — G214 Vascular parkinsonism: Secondary | ICD-10-CM | POA: Diagnosis not present

## 2017-09-27 DIAGNOSIS — M130 Polyarthritis, unspecified: Secondary | ICD-10-CM | POA: Diagnosis not present

## 2017-09-27 DIAGNOSIS — F0150 Vascular dementia without behavioral disturbance: Secondary | ICD-10-CM | POA: Diagnosis not present

## 2017-09-28 DIAGNOSIS — H524 Presbyopia: Secondary | ICD-10-CM | POA: Diagnosis not present

## 2017-09-28 DIAGNOSIS — H353131 Nonexudative age-related macular degeneration, bilateral, early dry stage: Secondary | ICD-10-CM | POA: Diagnosis not present

## 2017-09-28 DIAGNOSIS — H26492 Other secondary cataract, left eye: Secondary | ICD-10-CM | POA: Diagnosis not present

## 2017-09-28 DIAGNOSIS — Z961 Presence of intraocular lens: Secondary | ICD-10-CM | POA: Diagnosis not present

## 2017-09-28 DIAGNOSIS — H52203 Unspecified astigmatism, bilateral: Secondary | ICD-10-CM | POA: Diagnosis not present

## 2017-10-04 ENCOUNTER — Ambulatory Visit: Payer: PPO | Admitting: Surgery

## 2017-10-04 ENCOUNTER — Encounter (HOSPITAL_COMMUNITY): Payer: PPO

## 2017-11-12 ENCOUNTER — Ambulatory Visit: Payer: PPO | Admitting: Urology

## 2017-12-06 DIAGNOSIS — S139XXA Sprain of joints and ligaments of unspecified parts of neck, initial encounter: Secondary | ICD-10-CM | POA: Diagnosis not present

## 2017-12-06 DIAGNOSIS — Z79899 Other long term (current) drug therapy: Secondary | ICD-10-CM | POA: Diagnosis not present

## 2017-12-06 DIAGNOSIS — S59902A Unspecified injury of left elbow, initial encounter: Secondary | ICD-10-CM | POA: Diagnosis not present

## 2017-12-06 DIAGNOSIS — S6992XA Unspecified injury of left wrist, hand and finger(s), initial encounter: Secondary | ICD-10-CM | POA: Diagnosis not present

## 2017-12-06 DIAGNOSIS — S4992XA Unspecified injury of left shoulder and upper arm, initial encounter: Secondary | ICD-10-CM | POA: Diagnosis not present

## 2017-12-06 DIAGNOSIS — T148XXA Other injury of unspecified body region, initial encounter: Secondary | ICD-10-CM | POA: Diagnosis not present

## 2017-12-06 DIAGNOSIS — S4991XA Unspecified injury of right shoulder and upper arm, initial encounter: Secondary | ICD-10-CM | POA: Diagnosis not present

## 2017-12-06 DIAGNOSIS — S43401A Unspecified sprain of right shoulder joint, initial encounter: Secondary | ICD-10-CM | POA: Diagnosis not present

## 2017-12-06 DIAGNOSIS — M79601 Pain in right arm: Secondary | ICD-10-CM | POA: Diagnosis not present

## 2017-12-06 DIAGNOSIS — M25511 Pain in right shoulder: Secondary | ICD-10-CM | POA: Diagnosis not present

## 2017-12-06 DIAGNOSIS — M542 Cervicalgia: Secondary | ICD-10-CM | POA: Diagnosis not present

## 2017-12-06 DIAGNOSIS — S6991XA Unspecified injury of right wrist, hand and finger(s), initial encounter: Secondary | ICD-10-CM | POA: Diagnosis not present

## 2017-12-06 DIAGNOSIS — S59901A Unspecified injury of right elbow, initial encounter: Secondary | ICD-10-CM | POA: Diagnosis not present

## 2017-12-06 DIAGNOSIS — M79602 Pain in left arm: Secondary | ICD-10-CM | POA: Diagnosis not present

## 2017-12-06 DIAGNOSIS — S199XXA Unspecified injury of neck, initial encounter: Secondary | ICD-10-CM | POA: Diagnosis not present

## 2017-12-17 ENCOUNTER — Other Ambulatory Visit: Payer: Self-pay | Admitting: *Deleted

## 2017-12-17 DIAGNOSIS — Z710 Person encountering health services to consult on behalf of another person: Secondary | ICD-10-CM | POA: Diagnosis not present
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
## 2017-12-20 ENCOUNTER — Other Ambulatory Visit: Payer: Self-pay | Admitting: *Deleted
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
## 2017-12-23 ENCOUNTER — Other Ambulatory Visit: Payer: Self-pay | Admitting: *Deleted
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
## 2017-12-30 ENCOUNTER — Other Ambulatory Visit: Payer: Self-pay | Admitting: *Deleted
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
## 2018-01-04 DIAGNOSIS — F0390 Unspecified dementia without behavioral disturbance: Secondary | ICD-10-CM | POA: Diagnosis not present

## 2018-01-04 DIAGNOSIS — G20 Parkinson's disease: Secondary | ICD-10-CM | POA: Diagnosis not present

## 2018-01-04 DIAGNOSIS — Z9181 History of falling: Secondary | ICD-10-CM | POA: Diagnosis not present

## 2018-01-04 DIAGNOSIS — Z6828 Body mass index (BMI) 28.0-28.9, adult: Secondary | ICD-10-CM | POA: Diagnosis not present

## 2018-01-04 DIAGNOSIS — Z712 Person consulting for explanation of examination or test findings: Secondary | ICD-10-CM | POA: Diagnosis not present

## 2018-02-16 DIAGNOSIS — R2689 Other abnormalities of gait and mobility: Secondary | ICD-10-CM | POA: Diagnosis not present

## 2018-02-16 DIAGNOSIS — G894 Chronic pain syndrome: Secondary | ICD-10-CM | POA: Diagnosis not present

## 2018-02-16 DIAGNOSIS — F0151 Vascular dementia with behavioral disturbance: Secondary | ICD-10-CM | POA: Diagnosis not present

## 2018-02-16 DIAGNOSIS — G214 Vascular parkinsonism: Secondary | ICD-10-CM | POA: Diagnosis not present

## 2018-02-25 DIAGNOSIS — M9080 Osteopathy in diseases classified elsewhere, unspecified site: Secondary | ICD-10-CM | POA: Diagnosis not present

## 2018-02-25 DIAGNOSIS — Z79899 Other long term (current) drug therapy: Secondary | ICD-10-CM | POA: Diagnosis not present

## 2018-03-04 DIAGNOSIS — R569 Unspecified convulsions: Secondary | ICD-10-CM | POA: Diagnosis not present

## 2018-03-04 DIAGNOSIS — G4089 Other seizures: Secondary | ICD-10-CM | POA: Diagnosis not present

## 2018-03-11 DIAGNOSIS — G20 Parkinson's disease: Secondary | ICD-10-CM | POA: Diagnosis not present

## 2018-03-11 DIAGNOSIS — F5101 Primary insomnia: Secondary | ICD-10-CM | POA: Diagnosis not present

## 2018-03-11 DIAGNOSIS — Z6828 Body mass index (BMI) 28.0-28.9, adult: Secondary | ICD-10-CM | POA: Diagnosis not present

## 2018-03-11 DIAGNOSIS — I6529 Occlusion and stenosis of unspecified carotid artery: Secondary | ICD-10-CM | POA: Diagnosis not present

## 2018-03-11 DIAGNOSIS — M25552 Pain in left hip: Secondary | ICD-10-CM | POA: Diagnosis not present

## 2018-03-11 DIAGNOSIS — M479 Spondylosis, unspecified: Secondary | ICD-10-CM | POA: Diagnosis not present

## 2018-03-11 DIAGNOSIS — I10 Essential (primary) hypertension: Secondary | ICD-10-CM | POA: Diagnosis not present

## 2018-03-11 DIAGNOSIS — E039 Hypothyroidism, unspecified: Secondary | ICD-10-CM | POA: Diagnosis not present

## 2018-03-11 DIAGNOSIS — F419 Anxiety disorder, unspecified: Secondary | ICD-10-CM | POA: Diagnosis not present

## 2018-03-11 DIAGNOSIS — M542 Cervicalgia: Secondary | ICD-10-CM | POA: Diagnosis not present

## 2018-03-11 DIAGNOSIS — M5430 Sciatica, unspecified side: Secondary | ICD-10-CM | POA: Diagnosis not present

## 2018-03-11 DIAGNOSIS — Z710 Person encountering health services to consult on behalf of another person: Secondary | ICD-10-CM | POA: Diagnosis not present

## 2018-03-30 DIAGNOSIS — G214 Vascular parkinsonism: Secondary | ICD-10-CM | POA: Diagnosis not present

## 2018-03-30 DIAGNOSIS — R2689 Other abnormalities of gait and mobility: Secondary | ICD-10-CM | POA: Diagnosis not present

## 2018-03-30 DIAGNOSIS — G894 Chronic pain syndrome: Secondary | ICD-10-CM | POA: Diagnosis not present

## 2018-03-30 DIAGNOSIS — F0151 Vascular dementia with behavioral disturbance: Secondary | ICD-10-CM | POA: Diagnosis not present

## 2018-05-26 DIAGNOSIS — G4710 Hypersomnia, unspecified: Secondary | ICD-10-CM | POA: Diagnosis not present

## 2018-05-26 DIAGNOSIS — R112 Nausea with vomiting, unspecified: Secondary | ICD-10-CM | POA: Diagnosis not present

## 2018-05-26 DIAGNOSIS — G20 Parkinson's disease: Secondary | ICD-10-CM | POA: Diagnosis not present

## 2018-05-26 DIAGNOSIS — E782 Mixed hyperlipidemia: Secondary | ICD-10-CM | POA: Diagnosis not present

## 2018-05-26 DIAGNOSIS — Z6829 Body mass index (BMI) 29.0-29.9, adult: Secondary | ICD-10-CM | POA: Diagnosis not present

## 2018-05-26 DIAGNOSIS — E039 Hypothyroidism, unspecified: Secondary | ICD-10-CM | POA: Diagnosis not present

## 2018-05-26 DIAGNOSIS — R0602 Shortness of breath: Secondary | ICD-10-CM | POA: Diagnosis not present

## 2018-05-26 DIAGNOSIS — F0390 Unspecified dementia without behavioral disturbance: Secondary | ICD-10-CM | POA: Diagnosis not present

## 2018-05-26 DIAGNOSIS — R600 Localized edema: Secondary | ICD-10-CM | POA: Diagnosis not present

## 2018-06-08 ENCOUNTER — Other Ambulatory Visit: Payer: Self-pay

## 2018-06-08 ENCOUNTER — Inpatient Hospital Stay (HOSPITAL_COMMUNITY): Payer: PPO

## 2018-06-08 ENCOUNTER — Inpatient Hospital Stay (HOSPITAL_COMMUNITY)
Admission: AD | Admit: 2018-06-08 | Discharge: 2018-06-12 | DRG: 291 | Disposition: A | Discharge status: Home Health | Payer: PPO | Source: Ambulatory Visit | Attending: Internal Medicine | Admitting: Internal Medicine

## 2018-06-08 ENCOUNTER — Encounter (HOSPITAL_COMMUNITY): Payer: Self-pay

## 2018-06-08 DIAGNOSIS — R600 Localized edema: Secondary | ICD-10-CM | POA: Diagnosis not present

## 2018-06-08 DIAGNOSIS — E876 Hypokalemia: Secondary | ICD-10-CM | POA: Diagnosis not present

## 2018-06-08 DIAGNOSIS — G20A1 Parkinson's disease without dyskinesia, without mention of fluctuations: Secondary | ICD-10-CM

## 2018-06-08 DIAGNOSIS — I130 Hypertensive heart and chronic kidney disease with heart failure and stage 1 through stage 4 chronic kidney disease, or unspecified chronic kidney disease: Secondary | ICD-10-CM | POA: Diagnosis not present

## 2018-06-08 DIAGNOSIS — G20 Parkinson's disease: Secondary | ICD-10-CM | POA: Diagnosis not present

## 2018-06-08 DIAGNOSIS — Z6829 Body mass index (BMI) 29.0-29.9, adult: Secondary | ICD-10-CM | POA: Diagnosis not present

## 2018-06-08 DIAGNOSIS — Z881 Allergy status to other antibiotic agents status: Secondary | ICD-10-CM | POA: Diagnosis not present

## 2018-06-08 DIAGNOSIS — Z7982 Long term (current) use of aspirin: Secondary | ICD-10-CM | POA: Diagnosis not present

## 2018-06-08 DIAGNOSIS — R0602 Shortness of breath: Secondary | ICD-10-CM | POA: Diagnosis not present

## 2018-06-08 DIAGNOSIS — Z79890 Hormone replacement therapy (postmenopausal): Secondary | ICD-10-CM | POA: Diagnosis not present

## 2018-06-08 DIAGNOSIS — F0281 Dementia in other diseases classified elsewhere with behavioral disturbance: Secondary | ICD-10-CM | POA: Diagnosis not present

## 2018-06-08 DIAGNOSIS — D631 Anemia in chronic kidney disease: Secondary | ICD-10-CM | POA: Diagnosis present

## 2018-06-08 DIAGNOSIS — I5033 Acute on chronic diastolic (congestive) heart failure: Secondary | ICD-10-CM | POA: Diagnosis not present

## 2018-06-08 DIAGNOSIS — R112 Nausea with vomiting, unspecified: Secondary | ICD-10-CM | POA: Diagnosis not present

## 2018-06-08 DIAGNOSIS — Z79899 Other long term (current) drug therapy: Secondary | ICD-10-CM

## 2018-06-08 DIAGNOSIS — E785 Hyperlipidemia, unspecified: Secondary | ICD-10-CM

## 2018-06-08 DIAGNOSIS — E039 Hypothyroidism, unspecified: Secondary | ICD-10-CM | POA: Diagnosis present

## 2018-06-08 DIAGNOSIS — R778 Other specified abnormalities of plasma proteins: Secondary | ICD-10-CM

## 2018-06-08 DIAGNOSIS — R0789 Other chest pain: Secondary | ICD-10-CM

## 2018-06-08 DIAGNOSIS — I340 Nonrheumatic mitral (valve) insufficiency: Secondary | ICD-10-CM | POA: Diagnosis not present

## 2018-06-08 DIAGNOSIS — R748 Abnormal levels of other serum enzymes: Secondary | ICD-10-CM | POA: Diagnosis not present

## 2018-06-08 DIAGNOSIS — G4710 Hypersomnia, unspecified: Secondary | ICD-10-CM | POA: Diagnosis not present

## 2018-06-08 DIAGNOSIS — I739 Peripheral vascular disease, unspecified: Secondary | ICD-10-CM | POA: Diagnosis not present

## 2018-06-08 DIAGNOSIS — I5043 Acute on chronic combined systolic (congestive) and diastolic (congestive) heart failure: Secondary | ICD-10-CM | POA: Diagnosis not present

## 2018-06-08 DIAGNOSIS — I5021 Acute systolic (congestive) heart failure: Secondary | ICD-10-CM | POA: Diagnosis not present

## 2018-06-08 DIAGNOSIS — I248 Other forms of acute ischemic heart disease: Secondary | ICD-10-CM | POA: Diagnosis not present

## 2018-06-08 DIAGNOSIS — Z87891 Personal history of nicotine dependence: Secondary | ICD-10-CM

## 2018-06-08 DIAGNOSIS — R079 Chest pain, unspecified: Secondary | ICD-10-CM | POA: Diagnosis not present

## 2018-06-08 DIAGNOSIS — N179 Acute kidney failure, unspecified: Secondary | ICD-10-CM | POA: Diagnosis present

## 2018-06-08 DIAGNOSIS — N1831 Chronic kidney disease, stage 3a: Secondary | ICD-10-CM

## 2018-06-08 DIAGNOSIS — M79672 Pain in left foot: Secondary | ICD-10-CM | POA: Diagnosis not present

## 2018-06-08 DIAGNOSIS — I10 Essential (primary) hypertension: Secondary | ICD-10-CM

## 2018-06-08 DIAGNOSIS — I252 Old myocardial infarction: Secondary | ICD-10-CM | POA: Diagnosis not present

## 2018-06-08 DIAGNOSIS — N183 Chronic kidney disease, stage 3 (moderate): Secondary | ICD-10-CM | POA: Diagnosis present

## 2018-06-08 DIAGNOSIS — F0390 Unspecified dementia without behavioral disturbance: Secondary | ICD-10-CM | POA: Diagnosis not present

## 2018-06-08 DIAGNOSIS — D649 Anemia, unspecified: Secondary | ICD-10-CM | POA: Diagnosis not present

## 2018-06-08 DIAGNOSIS — R7989 Other specified abnormal findings of blood chemistry: Secondary | ICD-10-CM

## 2018-06-08 DIAGNOSIS — M79671 Pain in right foot: Secondary | ICD-10-CM | POA: Diagnosis not present

## 2018-06-08 LAB — CBC WITH DIFFERENTIAL/PLATELET
Basophils Absolute: 0 10*3/uL (ref 0.0–0.1)
Basophils Relative: 0 %
Eosinophils Absolute: 0.1 10*3/uL (ref 0.0–0.7)
Eosinophils Relative: 3 %
HCT: 31.8 % — ABNORMAL LOW (ref 39.0–52.0)
Hemoglobin: 10.4 g/dL — ABNORMAL LOW (ref 13.0–17.0)
Lymphocytes Relative: 26 %
Lymphs Abs: 1.4 10*3/uL (ref 0.7–4.0)
MCH: 31.5 pg (ref 26.0–34.0)
MCHC: 32.7 g/dL (ref 30.0–36.0)
MCV: 96.4 fL (ref 78.0–100.0)
Monocytes Absolute: 0.7 10*3/uL (ref 0.1–1.0)
Monocytes Relative: 13 %
Neutro Abs: 3 10*3/uL (ref 1.7–7.7)
Neutrophils Relative %: 58 %
Platelets: 187 10*3/uL (ref 150–400)
RBC: 3.3 MIL/uL — ABNORMAL LOW (ref 4.22–5.81)
RDW: 14.9 % (ref 11.5–15.5)
WBC: 5.2 10*3/uL (ref 4.0–10.5)

## 2018-06-08 LAB — COMPREHENSIVE METABOLIC PANEL
ALT: 8 U/L (ref 0–44)
AST: 24 U/L (ref 15–41)
Albumin: 3.7 g/dL (ref 3.5–5.0)
Alkaline Phosphatase: 81 U/L (ref 38–126)
Anion gap: 12 (ref 5–15)
BUN: 30 mg/dL — ABNORMAL HIGH (ref 8–23)
CO2: 25 mmol/L (ref 22–32)
Calcium: 9.5 mg/dL (ref 8.9–10.3)
Chloride: 103 mmol/L (ref 98–111)
Creatinine, Ser: 1.37 mg/dL — ABNORMAL HIGH (ref 0.61–1.24)
GFR calc Af Amer: 57 mL/min — ABNORMAL LOW (ref >60)
GFR calc non Af Amer: 49 mL/min — ABNORMAL LOW (ref >60)
Glucose, Bld: 85 mg/dL (ref 70–99)
Potassium: 3.5 mmol/L (ref 3.5–5.1)
Sodium: 140 mmol/L (ref 135–145)
Total Bilirubin: 0.5 mg/dL (ref 0.3–1.2)
Total Protein: 8 g/dL (ref 6.5–8.1)

## 2018-06-08 LAB — URINALYSIS, ROUTINE W REFLEX MICROSCOPIC
Bilirubin Urine: NEGATIVE
Glucose, UA: NEGATIVE mg/dL
Hgb urine dipstick: NEGATIVE
Ketones, ur: NEGATIVE mg/dL
Leukocytes, UA: NEGATIVE
Nitrite: NEGATIVE
Protein, ur: NEGATIVE mg/dL
Specific Gravity, Urine: 1.005 (ref 1.005–1.030)
pH: 7 (ref 5.0–8.0)

## 2018-06-08 LAB — BRAIN NATRIURETIC PEPTIDE: B Natriuretic Peptide: 973 pg/mL — ABNORMAL HIGH (ref 0.0–100.0)

## 2018-06-08 LAB — PHOSPHORUS: Phosphorus: 4.7 mg/dL — ABNORMAL HIGH (ref 2.5–4.6)

## 2018-06-08 LAB — TROPONIN I
Troponin I: 0.15 ng/mL (ref <0.03)
Troponin I: 0.14 ng/mL (ref <0.03)

## 2018-06-08 LAB — MAGNESIUM: Magnesium: 1.9 mg/dL (ref 1.7–2.4)

## 2018-06-08 LAB — TSH: TSH: 3.733 u[IU]/mL (ref 0.350–4.500)

## 2018-06-08 MED ORDER — FUROSEMIDE 10 MG/ML IJ SOLN
60.0000 mg | Freq: Two times a day (BID) | INTRAMUSCULAR | Status: DC
  Administered 2018-06-08 – 2018-06-09 (×3): 60 mg via INTRAVENOUS
  Filled 2018-06-08 (×3): qty 6

## 2018-06-08 MED ORDER — ASPIRIN EC 325 MG PO TBEC
325.0000 mg | DELAYED_RELEASE_TABLET | Freq: Every morning | ORAL | Status: DC
  Administered 2018-06-09: 325 mg via ORAL
  Filled 2018-06-08 (×2): qty 1

## 2018-06-08 MED ORDER — SIMVASTATIN 10 MG PO TABS
10.0000 mg | ORAL_TABLET | Freq: Every day | ORAL | Status: DC
  Administered 2018-06-08 – 2018-06-11 (×4): 10 mg via ORAL
  Filled 2018-06-08 (×4): qty 1

## 2018-06-08 MED ORDER — CARBIDOPA-LEVODOPA 25-100 MG PO TABS
1.0000 | ORAL_TABLET | Freq: Two times a day (BID) | ORAL | Status: DC
  Administered 2018-06-08 – 2018-06-12 (×8): 1 via ORAL
  Filled 2018-06-08 (×8): qty 1

## 2018-06-08 MED ORDER — ALUM & MAG HYDROXIDE-SIMETH 200-200-20 MG/5ML PO SUSP
30.0000 mL | ORAL | Status: DC | PRN
  Administered 2018-06-08: 30 mL via ORAL
  Filled 2018-06-08: qty 30

## 2018-06-08 MED ORDER — FOLIC ACID 1 MG PO TABS
1.0000 mg | ORAL_TABLET | Freq: Every day | ORAL | Status: DC
  Administered 2018-06-08 – 2018-06-11 (×4): 1 mg via ORAL
  Filled 2018-06-08 (×4): qty 1

## 2018-06-08 MED ORDER — ENOXAPARIN SODIUM 40 MG/0.4ML ~~LOC~~ SOLN
40.0000 mg | SUBCUTANEOUS | Status: DC
  Administered 2018-06-08: 40 mg via SUBCUTANEOUS
  Filled 2018-06-08: qty 0.4

## 2018-06-08 MED ORDER — RASAGILINE MESYLATE 1 MG PO TABS
0.5000 mg | ORAL_TABLET | Freq: Every day | ORAL | Status: DC
  Administered 2018-06-08 – 2018-06-11 (×4): 0.5 mg via ORAL
  Filled 2018-06-08 (×5): qty 1

## 2018-06-08 MED ORDER — DONEPEZIL HCL 5 MG PO TABS
5.0000 mg | ORAL_TABLET | Freq: Every day | ORAL | Status: DC
  Administered 2018-06-08 – 2018-06-11 (×4): 5 mg via ORAL
  Filled 2018-06-08 (×4): qty 1

## 2018-06-08 MED ORDER — DULOXETINE HCL 60 MG PO CPEP
60.0000 mg | ORAL_CAPSULE | Freq: Every day | ORAL | Status: DC
  Administered 2018-06-08 – 2018-06-11 (×4): 60 mg via ORAL
  Filled 2018-06-08 (×4): qty 1

## 2018-06-08 MED ORDER — ADULT MULTIVITAMIN W/MINERALS CH
1.0000 | ORAL_TABLET | Freq: Every morning | ORAL | Status: DC
  Administered 2018-06-09 – 2018-06-12 (×4): 1 via ORAL
  Filled 2018-06-08 (×4): qty 1

## 2018-06-08 MED ORDER — LEVOTHYROXINE SODIUM 88 MCG PO TABS
88.0000 ug | ORAL_TABLET | Freq: Every day | ORAL | Status: DC
  Administered 2018-06-09 – 2018-06-12 (×4): 88 ug via ORAL
  Filled 2018-06-08 (×4): qty 1

## 2018-06-08 MED ORDER — LOSARTAN POTASSIUM 50 MG PO TABS
100.0000 mg | ORAL_TABLET | Freq: Every day | ORAL | Status: DC
  Administered 2018-06-08 – 2018-06-09 (×2): 100 mg via ORAL
  Filled 2018-06-08 (×2): qty 2

## 2018-06-08 MED ORDER — OXYBUTYNIN CHLORIDE 5 MG PO TABS
5.0000 mg | ORAL_TABLET | Freq: Two times a day (BID) | ORAL | Status: DC
  Administered 2018-06-08 – 2018-06-12 (×8): 5 mg via ORAL
  Filled 2018-06-08 (×8): qty 1

## 2018-06-08 NOTE — Progress Notes (Signed)
**Note De-identified  Obfuscation** EKG complete and placed in patient chart.  RN notified 
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
## 2018-06-08 NOTE — Progress Notes (Signed)
CRITICAL VALUE ALERT  Critical Value:  0.15 trop  Date & Time Notied:  06/08/18 2050  Provider Notified: obatga  Orders Received/Actions taken: new lab

## 2018-06-09 ENCOUNTER — Inpatient Hospital Stay (HOSPITAL_COMMUNITY): Payer: PPO

## 2018-06-09 DIAGNOSIS — R079 Chest pain, unspecified: Secondary | ICD-10-CM

## 2018-06-09 DIAGNOSIS — E785 Hyperlipidemia, unspecified: Secondary | ICD-10-CM

## 2018-06-09 DIAGNOSIS — R748 Abnormal levels of other serum enzymes: Secondary | ICD-10-CM

## 2018-06-09 DIAGNOSIS — E039 Hypothyroidism, unspecified: Secondary | ICD-10-CM

## 2018-06-09 DIAGNOSIS — I10 Essential (primary) hypertension: Secondary | ICD-10-CM

## 2018-06-09 DIAGNOSIS — G20 Parkinson's disease: Secondary | ICD-10-CM

## 2018-06-09 DIAGNOSIS — F0281 Dementia in other diseases classified elsewhere with behavioral disturbance: Secondary | ICD-10-CM

## 2018-06-09 DIAGNOSIS — I5043 Acute on chronic combined systolic (congestive) and diastolic (congestive) heart failure: Secondary | ICD-10-CM

## 2018-06-09 LAB — ECHOCARDIOGRAM COMPLETE
Height: 68.5 in
Weight: 2881.8531 oz

## 2018-06-09 LAB — CBC
HCT: 32.5 % — ABNORMAL LOW (ref 39.0–52.0)
Hemoglobin: 10.5 g/dL — ABNORMAL LOW (ref 13.0–17.0)
MCH: 30.9 pg (ref 26.0–34.0)
MCHC: 32.3 g/dL (ref 30.0–36.0)
MCV: 95.6 fL (ref 78.0–100.0)
Platelets: 188 10*3/uL (ref 150–400)
RBC: 3.4 MIL/uL — ABNORMAL LOW (ref 4.22–5.81)
RDW: 14.7 % (ref 11.5–15.5)
WBC: 5 10*3/uL (ref 4.0–10.5)

## 2018-06-09 LAB — BASIC METABOLIC PANEL
Anion gap: 11 (ref 5–15)
BUN: 33 mg/dL — ABNORMAL HIGH (ref 8–23)
CO2: 27 mmol/L (ref 22–32)
Calcium: 9.5 mg/dL (ref 8.9–10.3)
Chloride: 101 mmol/L (ref 98–111)
Creatinine, Ser: 1.44 mg/dL — ABNORMAL HIGH (ref 0.61–1.24)
GFR calc Af Amer: 53 mL/min — ABNORMAL LOW (ref >60)
GFR calc non Af Amer: 46 mL/min — ABNORMAL LOW (ref >60)
Glucose, Bld: 108 mg/dL — ABNORMAL HIGH (ref 70–99)
Potassium: 3.2 mmol/L — ABNORMAL LOW (ref 3.5–5.1)
Sodium: 139 mmol/L (ref 135–145)

## 2018-06-09 MED ORDER — POTASSIUM CHLORIDE CRYS ER 20 MEQ PO TBCR
30.0000 meq | EXTENDED_RELEASE_TABLET | Freq: Every day | ORAL | Status: DC
  Administered 2018-06-09 – 2018-06-12 (×4): 30 meq via ORAL
  Filled 2018-06-09 (×4): qty 1

## 2018-06-09 MED ORDER — CARVEDILOL 3.125 MG PO TABS
3.1250 mg | ORAL_TABLET | Freq: Two times a day (BID) | ORAL | Status: DC
  Administered 2018-06-10 – 2018-06-12 (×4): 3.125 mg via ORAL
  Filled 2018-06-09 (×5): qty 1

## 2018-06-09 MED ORDER — HEPARIN SODIUM (PORCINE) 5000 UNIT/ML IJ SOLN
5000.0000 [IU] | Freq: Three times a day (TID) | INTRAMUSCULAR | Status: DC
  Administered 2018-06-09 – 2018-06-12 (×9): 5000 [IU] via SUBCUTANEOUS
  Filled 2018-06-09 (×9): qty 1

## 2018-06-09 MED ORDER — ACETAMINOPHEN 325 MG PO TABS
650.0000 mg | ORAL_TABLET | Freq: Four times a day (QID) | ORAL | Status: DC | PRN
  Administered 2018-06-09 – 2018-06-11 (×4): 650 mg via ORAL
  Filled 2018-06-09 (×4): qty 2

## 2018-06-09 MED ORDER — LOSARTAN POTASSIUM 50 MG PO TABS
50.0000 mg | ORAL_TABLET | Freq: Every day | ORAL | Status: DC
  Administered 2018-06-10 – 2018-06-11 (×2): 50 mg via ORAL
  Filled 2018-06-09 (×2): qty 1

## 2018-06-09 MED ORDER — ONDANSETRON HCL 4 MG/2ML IJ SOLN
4.0000 mg | Freq: Four times a day (QID) | INTRAMUSCULAR | Status: DC | PRN
  Administered 2018-06-09 – 2018-06-12 (×2): 4 mg via INTRAVENOUS
  Filled 2018-06-09 (×2): qty 2

## 2018-06-09 NOTE — Progress Notes (Signed)
PROGRESS NOTE    [name]  KVQ:[id number] DOB: 26-Nov-1941 DOA: 06/08/2018 PCP: Celene Squibb, MD    Brief Narrative:  77 y/o with extensive PMH sent to ED by PCP due to SOB, orthopnea, increase LE swelling and elevated BNP. Found to have acute on chronic CHF (combined).  Assessment & Plan: 1-acute on chronic combined heart failure: -patient echo demonstrating hypokinesis and decrease EF -still SOB, with complaints of orthopnea and signs of fluid overload -troponin mildly elevated; no chest pain. Most likely demand ischemia in the setting of CHF exacerbation. -continue IV diuresis, daily weights and strict intake/output -low sodium diet  -cardiology consulted -started on low dose coreg, continue losartan.  2-HTN -BP is stable and well controlled -will monitor VS  3-hypothyroidism  -continue synthroid   4-hx of parkinson's disease -continue carbidopa  5-dementia with behavioral disturbances  -continue supportive care -continue aricept and cymbalta  -mood is stable.  6-HLD -continue zocor   7-CKD stage 3 -appears at baseline  -follow renal function closely while diuresing   DVT prophylaxis: heparin  Code Status: Full code. Family Communication: no family at bedside  Disposition Plan: remains inpatient, continue IV diuresis, follow electrolytes and renal function. Given hypokinesis, concerns for vegetations vs ruptured valve and decrease EF will consult cardiology. Patient denies CP.  Consultants:   Cardiology   Procedures:   2-D echo - Left ventricle: The cavity size was normal. Wall thickness was   increased in a pattern of moderate LVH. Systolic function was   mildly to moderately reduced. The estimated ejection fraction was   = 40%. Features are consistent with a pseudonormal left   ventricular filling pattern, with concomitant abnormal relaxation   and increased filling pressure (grade 2 diastolic dysfunction).   Doppler parameters are consistent with  high ventricular filling   pressure. - Regional wall motion abnormality: Hypokinesis of the apical   septal, apical lateral, and apical myocardium. - Aortic valve: Moderately calcified annulus. Moderately thickened   leaflets. There was mild to moderate regurgitation. Valve area   (VTI): 2.11 cm^2. Valve area (Vmax): 1.51 cm^2. - Mitral valve: Mildly calcified annulus. Mildly thickened leaflets   . There was mild regurgitation. There isa 5.8 x 8.2 mm echolucent   circular structure that is mobile adjacent to the MV subvavular   apparatus. This may represent a ruptured calcified portion of the   subvalvular apparatus, cannot exclude possible vegetation.   Consider TEE to further evaluate. - Left atrium: The atrium was mildly dilated. - Right atrium: The atrium was mildly dilated.  Antimicrobials:  Anti-infectives (From admission, onward)   None     Subjective: Afebrile, no CP; still complaining of SOB and orthopnea.  Objective: Vitals:   06/09/18 0900 06/09/18 1306 06/09/18 2014 06/09/18 2058  BP:  123/81  (!) 137/95  Pulse:  84  93  Resp:  19  20  Temp:  97.6 F (36.4 C)  97.9 F (36.6 C)  TempSrc:  Oral  Oral  SpO2: (!) 89% 95% 95% 99%  Weight:      Height:        Intake/Output Summary (Last 24 hours) at 06/09/2018 2159 Last data filed at 06/09/2018 2059 Gross per 24 hour  Intake 960 ml  Output 950 ml  Net 10 ml   Filed Weights   06/08/18 1742 06/08/18 1820  Weight: 81.7 kg (180 lb 1.9 oz) 81.7 kg (180 lb 1.9 oz)    Examination: General exam: Alert, afebrile, no complaining of CP. Patient  reports SOB, orthopnea and still with LE swelling. Respiratory system: decrease BS at the bases, no wheezing, no using accessory muscles. Cardiovascular system:Rate controlled, soft SEM, no rubs, no gallops.  Gastrointestinal system: Abdomen is nondistended, soft and nontender. No organomegaly or masses felt. Normal bowel sounds heard. Central nervous system: Alert and  oriented. No focal neurological deficits. CN intact. Extremities: No Cyanosis; positive 2+ LE edema bilaterally. Skin: No open wounds, no rashes or petechiae. Psychiatry: insight appears impaired. Mood & affect appropriate. Patient was forgetful.     Data Reviewed: I have personally reviewed following labs and imaging studies  CBC: Recent Labs  Lab 06/08/18 1840 06/09/18 0457  WBC 5.2 5.0  NEUTROABS 3.0  --   HGB 10.4* 10.5*  HCT 31.8* 32.5*  MCV 96.4 95.6  PLT 187 413   Basic Metabolic Panel: Recent Labs  Lab 06/08/18 1840 06/09/18 0457  NA 140 139  K 3.5 3.2*  CL 103 101  CO2 25 27  GLUCOSE 85 108*  BUN 30* 33*  CREATININE 1.37* 1.44*  CALCIUM 9.5 9.5  MG 1.9  --   PHOS 4.7*  --    GFR: Estimated Creatinine Clearance: 43.6 mL/min (A) (by C-G formula based on SCr of 1.44 mg/dL (H)).   Liver Function Tests: Recent Labs  Lab 06/08/18 1840  AST 24  ALT 8  ALKPHOS 81  BILITOT 0.5  PROT 8.0  ALBUMIN 3.7   Cardiac Enzymes: Recent Labs  Lab 06/08/18 1840 06/08/18 2121  TROPONINI 0.15* 0.14*   Thyroid Function Tests: Recent Labs    06/08/18 1840  TSH 3.733   Urine analysis:    Component Value Date/Time   COLORURINE COLORLESS (A) 06/08/2018 1814   APPEARANCEUR CLEAR 06/08/2018 1814   LABSPEC 1.005 06/08/2018 1814   PHURINE 7.0 06/08/2018 1814   GLUCOSEU NEGATIVE 06/08/2018 1814   HGBUR NEGATIVE 06/08/2018 1814   BILIRUBINUR NEGATIVE 06/08/2018 1814   KETONESUR NEGATIVE 06/08/2018 1814   PROTEINUR NEGATIVE 06/08/2018 1814   NITRITE NEGATIVE 06/08/2018 1814   LEUKOCYTESUR NEGATIVE 06/08/2018 1814    Recent Results (from the past 240 hour(s))  Culture, blood (Routine X 2) w Reflex to ID Panel     Status: None (Preliminary result)   Collection Time: 06/09/18 11:56 AM  Result Value Ref Range Status   Specimen Description BLOOD RIGHT ARM  Final   Special Requests   Final    BOTTLES DRAWN AEROBIC AND ANAEROBIC Blood Culture adequate  volume Performed at [facility], [address], [place], [place] [address]    Culture PENDING  Incomplete   Report Status PENDING  Incomplete  Culture, blood (Routine X 2) w Reflex to ID Panel     Status: None (Preliminary result)   Collection Time: 06/09/18 11:56 AM  Result Value Ref Range Status   Specimen Description BLOOD LEFT ARM  Final   Special Requests   Final    BOTTLES DRAWN AEROBIC AND ANAEROBIC Blood Culture adequate volume Performed at [facility], [address] [place], [place] [address]    Culture PENDING  Incomplete   Report Status PENDING  Incomplete     Radiology Studies: Portable Chest 1 View  Result Date: 06/08/2018 CLINICAL DATA:  Shortness of breath EXAM: PORTABLE CHEST 1 VIEW COMPARISON:  Chest radiograph 03/04/2011 FINDINGS: There is marked cardiomegaly with central pulmonary vascular congestion and apical pulmonary venous diversion. No focal airspace consolidation. No pleural effusion or pneumothorax. IMPRESSION: Cardiomegaly and apical pulmonary venous diversion without overt pulmonary edema. Electronically Signed  By: Ulyses Jarred M.D.   On: 06/08/2018 21:52     Scheduled Meds: . aspirin  325 mg Oral q morning - 10a  . carbidopa-levodopa  1 tablet Oral BID  . donepezil  5 mg Oral QHS  . DULoxetine  60 mg Oral QHS  . folic acid  1 mg Oral QHS  . furosemide  60 mg Intravenous BID  . heparin injection (subcutaneous)  5,000 Units Subcutaneous Q8H  . levothyroxine  88 mcg Oral QAC breakfast  . losartan  100 mg Oral QHS  . multivitamin with minerals  1 tablet Oral q morning - 10a  . oxybutynin  5 mg Oral BID  . potassium chloride  30 mEq Oral Daily  . rasagiline  0.5 mg Oral QHS  . simvastatin  10 mg Oral QHS   Continuous Infusions:   LOS: 1 day    Time spent: 30 minutes    Barton Dubois, MD [facility] Pager [phone]  If 7PM-7AM, please contact night-coverage [email or web address] Password [facility] 06/09/2018, 9:59 PM

## 2018-06-09 NOTE — Progress Notes (Signed)
*  PRELIMINARY RESULTS* Echocardiogram 2D Echocardiogram has been performed.  Samuel Germany 06/09/2018, 11:14 AM

## 2018-06-10 ENCOUNTER — Encounter (HOSPITAL_COMMUNITY)
Admission: AD | Disposition: A | Discharge status: Home Health | Payer: Self-pay | Source: Ambulatory Visit | Attending: Internal Medicine

## 2018-06-10 ENCOUNTER — Inpatient Hospital Stay (HOSPITAL_COMMUNITY): Payer: PPO

## 2018-06-10 DIAGNOSIS — I5021 Acute systolic (congestive) heart failure: Secondary | ICD-10-CM

## 2018-06-10 DIAGNOSIS — I340 Nonrheumatic mitral (valve) insufficiency: Secondary | ICD-10-CM

## 2018-06-10 DIAGNOSIS — R0602 Shortness of breath: Secondary | ICD-10-CM

## 2018-06-10 HISTORY — PX: TEE WITHOUT CARDIOVERSION: SHX5443

## 2018-06-10 LAB — BASIC METABOLIC PANEL
Anion gap: 11 (ref 5–15)
BUN: 43 mg/dL — AB (ref 8–23)
CHLORIDE: 102 mmol/L (ref 98–111)
CO2: 26 mmol/L (ref 22–32)
CREATININE: 1.77 mg/dL — AB (ref 0.61–1.24)
Calcium: 9.2 mg/dL (ref 8.9–10.3)
GFR calc Af Amer: 42 mL/min — ABNORMAL LOW (ref >60)
GFR, EST NON AFRICAN AMERICAN: 36 mL/min — AB (ref >60)
GLUCOSE: 87 mg/dL (ref 70–99)
POTASSIUM: 4.2 mmol/L (ref 3.5–5.1)
Sodium: 139 mmol/L (ref 135–145)

## 2018-06-10 SURGERY — ECHOCARDIOGRAM, TRANSESOPHAGEAL
Anesthesia: Moderate Sedation

## 2018-06-10 MED ORDER — ASPIRIN EC 81 MG PO TBEC
81.0000 mg | DELAYED_RELEASE_TABLET | Freq: Every morning | ORAL | Status: DC
  Administered 2018-06-10 – 2018-06-12 (×3): 81 mg via ORAL
  Filled 2018-06-10 (×2): qty 1

## 2018-06-10 MED ORDER — SODIUM CHLORIDE 0.9 % IV SOLN: INTRAVENOUS | Status: DC

## 2018-06-10 MED ORDER — BUTAMBEN-TETRACAINE-BENZOCAINE 2-2-14 % EX AERO
INHALATION_SPRAY | CUTANEOUS | Status: AC
  Filled 2018-06-10: qty 5

## 2018-06-10 MED ORDER — LIDOCAINE VISCOUS HCL 2 % MT SOLN
OROMUCOSAL | Status: AC
  Filled 2018-06-10: qty 15

## 2018-06-10 MED ORDER — MIDAZOLAM HCL 5 MG/5ML IJ SOLN
INTRAMUSCULAR | Status: AC
  Filled 2018-06-10: qty 5

## 2018-06-10 MED ORDER — MIDAZOLAM HCL 5 MG/5ML IJ SOLN
INTRAMUSCULAR | Status: DC | PRN
  Administered 2018-06-10: 0.5 mg via INTRAVENOUS
  Administered 2018-06-10: 1 mg via INTRAVENOUS

## 2018-06-10 MED ORDER — SODIUM CHLORIDE BACTERIOSTATIC 0.9 % IJ SOLN
INTRAMUSCULAR | Status: AC
  Filled 2018-06-10: qty 20

## 2018-06-10 MED ORDER — FENTANYL CITRATE (PF) 100 MCG/2ML IJ SOLN
INTRAMUSCULAR | Status: AC
  Filled 2018-06-10: qty 2

## 2018-06-10 MED ORDER — FENTANYL CITRATE (PF) 100 MCG/2ML IJ SOLN
INTRAMUSCULAR | Status: DC | PRN
  Administered 2018-06-10: 50 ug via INTRAVENOUS
  Administered 2018-06-10: 25 ug via INTRAVENOUS

## 2018-06-10 NOTE — Progress Notes (Signed)
PROGRESS NOTE    [name]  PRF:[id number] DOB: 07-Jun-1941 DOA: 06/08/2018 PCP: Celene Squibb, MD    Brief Narrative:  77 y/o with extensive PMH sent to ED by PCP due to SOB, orthopnea, increase LE swelling and elevated BNP. Found to have acute on chronic CHF (combined).  Assessment & Plan: 1-acute on chronic combined heart failure: -patient echo demonstrating hypokinesis and decrease EF (40% and with grade 2 diastolic dysfunction) -Fluid overload status improving, less short of breath.  Reports some mild orthopnea. -cardiology consulted and will follow rec's -troponin mildly elevated; no chest pain. Most likely demand ischemia in the setting of CHF exacerbation.  After discussing with cardiology service no acute ischemia work-up will be pursued at this moment. -Follow daily weights, low-sodium diet and a strict intake and output. -Lasix to be transitioned to oral regimen. -low sodium diet  -continue low dose coreg and losartan. -neg 1.5 L since admission   2-HTN -BP is stable and well controlled -will monitor VS  3-hypothyroidism  -continue synthroid  -TSH WNL  4-hx of parkinson's disease -continue carbidopa  5-dementia with behavioral disturbances  -continue supportive care -continue aricept and cymbalta  -mood is stable.  6-HLD -continue zocor   7-CKD stage 3 -Cr up to 1.7 -GFR at baseline -lasix transition to PO -follow trend   DVT prophylaxis: heparin  Code Status: Full code. Family Communication: no family at bedside  Disposition Plan: remains inpatient, diuretics will be transitioned to p.o.; continue beta-blocker, continue losartan follow cardiology further recommendations.    Consultants:   Cardiology   Procedures:   2-D echo - Left ventricle: The cavity size was normal. Wall thickness was   increased in a pattern of moderate LVH. Systolic function was   mildly to moderately reduced. The estimated ejection fraction was   = 40%. Features are  consistent with a pseudonormal left   ventricular filling pattern, with concomitant abnormal relaxation   and increased filling pressure (grade 2 diastolic dysfunction).   Doppler parameters are consistent with high ventricular filling   pressure. - Regional wall motion abnormality: Hypokinesis of the apical   septal, apical lateral, and apical myocardium. - Aortic valve: Moderately calcified annulus. Moderately thickened   leaflets. There was mild to moderate regurgitation. Valve area   (VTI): 2.11 cm^2. Valve area (Vmax): 1.51 cm^2. - Mitral valve: Mildly calcified annulus. Mildly thickened leaflets   . There was mild regurgitation. There isa 5.8 x 8.2 mm echolucent   circular structure that is mobile adjacent to the MV subvavular   apparatus. This may represent a ruptured calcified portion of the   subvalvular apparatus, cannot exclude possible vegetation.   Consider TEE to further evaluate. - Left atrium: The atrium was mildly dilated. - Right atrium: The atrium was mildly dilated.   TEE: has rule out vegetation; patient with positive chordae tendinae rupture on his MV.  Antimicrobials:  Anti-infectives (From admission, onward)   None     Subjective: No fever, no chest pain; still reporting some orthopnea but overall with improved shortness of breath.  No nausea, no vomiting.  Objective: Vitals:   06/10/18 1100 06/10/18 1105 06/10/18 1205 06/10/18 1346  BP: (!) 92/48 (!) 92/58 104/74 (!) 127/49  Pulse: 81 84 75 92  Resp: 12 13  18   Temp:    97.7 F (36.5 C)  TempSrc:    Oral  SpO2: 99% 97%  92%  Weight:      Height:        Intake/Output  Summary (Last 24 hours) at 06/10/2018 1502 Last data filed at 06/10/2018 1423 Gross per 24 hour  Intake 480 ml  Output 400 ml  Net 80 ml   Filed Weights   06/08/18 1742 06/08/18 1820 06/10/18 0700  Weight: 81.7 kg (180 lb 1.9 oz) 81.7 kg (180 lb 1.9 oz) 79.4 kg (175 lb 0.7 oz)    Examination: General exam: Alert, awake,  oriented x 3 currently; denies chest pain.  Still reports mild orthopnea, no tachypnea. Respiratory system: decreased BS at bases, no frank crackles at the bases bilaterally, no wheezing, no using accessory muscles.   Cardiovascular system:Rate controlled. No rubs, no gallops, positive SEM on exam.  Gastrointestinal system: Abdomen is nondistended, soft and nontender. No organomegaly or masses felt. Normal bowel sounds heard. Central nervous system: Alert and oriented. No focal neurological deficits. Extremities: 1+ edema bilaterally, no cyanosis  Skin: No rashes, lesions or ulcers Psychiatry: mood stable; insight slightly off.   Data Reviewed: I have personally reviewed following labs and imaging studies  CBC: Recent Labs  Lab 06/08/18 1840 06/09/18 0457  WBC 5.2 5.0  NEUTROABS 3.0  --   HGB 10.4* 10.5*  HCT 31.8* 32.5*  MCV 96.4 95.6  PLT 187 892   Basic Metabolic Panel: Recent Labs  Lab 06/08/18 1840 06/09/18 0457 06/10/18 0428  NA 140 139 139  K 3.5 3.2* 4.2  CL 103 101 102  CO2 25 27 26   GLUCOSE 85 108* 87  BUN 30* 33* 43*  CREATININE 1.37* 1.44* 1.77*  CALCIUM 9.5 9.5 9.2  MG 1.9  --   --   PHOS 4.7*  --   --    GFR: Estimated Creatinine Clearance: 35.5 mL/min (A) (by C-G formula based on SCr of 1.77 mg/dL (H)).   Liver Function Tests: Recent Labs  Lab 06/08/18 1840  AST 24  ALT 8  ALKPHOS 81  BILITOT 0.5  PROT 8.0  ALBUMIN 3.7   Cardiac Enzymes: Recent Labs  Lab 06/08/18 1840 06/08/18 2121  TROPONINI 0.15* 0.14*   Thyroid Function Tests: Recent Labs    06/08/18 1840  TSH 3.733   Urine analysis:    Component Value Date/Time   COLORURINE COLORLESS (A) 06/08/2018 1814   APPEARANCEUR CLEAR 06/08/2018 1814   LABSPEC 1.005 06/08/2018 1814   PHURINE 7.0 06/08/2018 1814   GLUCOSEU NEGATIVE 06/08/2018 1814   HGBUR NEGATIVE 06/08/2018 1814   BILIRUBINUR NEGATIVE 06/08/2018 1814   KETONESUR NEGATIVE 06/08/2018 1814   PROTEINUR NEGATIVE  06/08/2018 1814   NITRITE NEGATIVE 06/08/2018 1814   LEUKOCYTESUR NEGATIVE 06/08/2018 1814    Recent Results (from the past 240 hour(s))  Culture, blood (Routine X 2) w Reflex to ID Panel     Status: None (Preliminary result)   Collection Time: 06/09/18 11:56 AM  Result Value Ref Range Status   Specimen Description BLOOD RIGHT ARM  Final   Special Requests   Final    BOTTLES DRAWN AEROBIC AND ANAEROBIC Blood Culture adequate volume   Culture   Final    NO GROWTH < 24 HOURS Performed at [facility], [address], [place], [place] [address]    Report Status PENDING  Incomplete  Culture, blood (Routine X 2) w Reflex to ID Panel     Status: None (Preliminary result)   Collection Time: 06/09/18 11:56 AM  Result Value Ref Range Status   Specimen Description BLOOD LEFT ARM  Final   Special Requests   Final    BOTTLES DRAWN AEROBIC AND  ANAEROBIC Blood Culture adequate volume   Culture   Final    NO GROWTH < 24 HOURS Performed at [facility], [address], [place], [place] [address]    Report Status PENDING  Incomplete     Radiology Studies: Portable Chest 1 View  Result Date: 06/08/2018 CLINICAL DATA:  Shortness of breath EXAM: PORTABLE CHEST 1 VIEW COMPARISON:  Chest radiograph 03/04/2011 FINDINGS: There is marked cardiomegaly with central pulmonary vascular congestion and apical pulmonary venous diversion. No focal airspace consolidation. No pleural effusion or pneumothorax. IMPRESSION: Cardiomegaly and apical pulmonary venous diversion without overt pulmonary edema. Electronically Signed   By: Ulyses Jarred M.D.   On: 06/08/2018 21:52    Scheduled Meds: . aspirin  81 mg Oral q morning - 10a  . butamben-tetracaine-benzocaine      . carbidopa-levodopa  1 tablet Oral BID  . carvedilol  3.125 mg Oral BID WC  . donepezil  5 mg Oral QHS  . DULoxetine  60 mg Oral QHS  . fentaNYL      . folic acid  1 mg Oral QHS  . heparin injection (subcutaneous)  5,000 Units Subcutaneous Q8H  .  levothyroxine  88 mcg Oral QAC breakfast  . lidocaine      . losartan  50 mg Oral QHS  . midazolam      . multivitamin with minerals  1 tablet Oral q morning - 10a  . oxybutynin  5 mg Oral BID  . potassium chloride  30 mEq Oral Daily  . rasagiline  0.5 mg Oral QHS  . simvastatin  10 mg Oral QHS  . sodium chloride bacteriostatic       Continuous Infusions:   LOS: 2 days    Time spent: 30 minutes    Barton Dubois, MD [facility] Pager [phone]  If 7PM-7AM, please contact night-coverage [email or web address] Password [facility] 06/10/2018, 3:02 PM

## 2018-06-10 NOTE — Progress Notes (Signed)
*  PRELIMINARY RESULTS* Echocardiogram Echocardiogram Transesophageal has been performed.  Leavy Cella 06/10/2018, 11:32 AM

## 2018-06-10 NOTE — CV Procedure (Signed)
CV Procedure Note  Procedure: TEE Physician: Dr Carlyle Dolly Indication: cardiac mass, evaluate for possible vegetation   Patient was brought to the procedure suite after appropriate consent was obtained. The posterior oropharynx was anesthesized with 2% viscous lidocaine. Moderate sedation was achieved with 1.5mg  of versed and 68mcg of fentanyl. Bite block placed, he was plated in the lateral decubitus position. Cardiopulmonary monitoring was performed throughout the procedure, he tolererated well without complications. See full TEE report for complete findings, essentially appears to have ruptured chrodae tendinae, no evidence of vegetation.    Carlyle Dolly MD

## 2018-06-10 NOTE — Consult Note (Addendum)
Cardiology Consult    Patient ID: [name]; [id number]; October 09, 1941   Admit date: 06/08/2018 Date of Consult: 06/10/2018  Primary Care Provider: Celene Squibb, MD Primary Cardiologist: Previously followed by Dr. Debara Pickett in 2017  Patient Profile    [name] is a 77 y.o. male with past medical history of HTN, HLD, carotid artery stenosis (s/p R CEA in 09/2016), Stage 3 CKD, Dementia, Parkinson's Disease who is being seen today for the evaluation of CHF at the request of Dr. Dyann Kief.   History of Present Illness    [name] was last evaluated by cardiology in 09/2016 and was overall doing well from a cardiac perspective at that time. He was being followed by vascular surgery for possible upcoming right carotid endarterectomy, therefore an echocardiogram and NST had been performed for cardiac clearance. His echocardiogram showed preserved EF of 60 to 65% with no regional wall motion abnormalities and grade 2 diastolic dysfunction. NST showed no evidence of ischemia or prior infarction and was overall a low risk study.  He presented to [facility] ED from his PCP's office on 06/08/2018 for evaluation of worsening dyspnea and lower extremity edema. In talking with the patient today, he reports having worsening dyspnea on exertion and at rest over the past few weeks. Has also experienced worsening lower extremity edema. Unsure of any weight changes on his home scales. Denies any chest pain or palpitations.    Initial labs show WBC 5.2, Hgb 10.4, platelets 187, Na+ 140, K+ 3.5, and creatinine 1.37. BNP 973. Initial troponin 0.15 with delta value of 0.14. CXR showed cardiomegaly with pulmonary venous diversion without overt edema. EKG shows sinus tachycardia, HR 108, with q-waves along lateral leads and nonspecific IVCD. Echo shows a reduced EF of 40% with Grade 2 DD, mild to moderate AI, and mild MR with a  5.8 x 8.2 mm echolucent circular structure that is mobile adjacent to the MV subvavular  apparatus which may represent a ruptured calcified portion of the subvalvular apparatus, cannot exclude possible vegetation.   He has been started on IV Lasix 12m BID and is overall -1.3L with weight having declined by 5 lbs. Repeat BMET this AM shows that creatinine has trended up to 1.77.   Past Medical History:  Diagnosis Date  . Anxiety   . GERD (gastroesophageal reflux disease)   . Hyperlipidemia   . Hypertension   . Myocardial infarction ([facility]   . [facility]romuscular disorder ([facility]   . Parkinson's disease ([facility]   . Peripheral vascular disease ([facility]     Past Surgical History:  Procedure Laterality Date  . BLADDER REPAIR     pt sts "when I had my prostatectomy they cut too much and I have an internal button I have to press in order to release my urine"  . BLADDER SURGERY     2010  pump placed   . CATARACT EXTRACTION W/PHACO  07/28/2011   Procedure: CATARACT EXTRACTION PHACO AND INTRAOCULAR LENS PLACEMENT (IOC);  Surgeon: MElta GuadeloupeT. SGershon Crane  Location: [facility];  Service: Ophthalmology;  Laterality: Right;  CDE: 20.26  . CATARACT EXTRACTION W/PHACO  09/08/2011   Procedure: CATARACT EXTRACTION PHACO AND INTRAOCULAR LENS PLACEMENT (IOC);  Surgeon: MElta GuadeloupeT. SGershon Crane  Location: [facility];  Service: Ophthalmology;  Laterality: Left;  CDE:37.31  . ENDARTERECTOMY Right 10/07/2016   Procedure: RIGHT ENDARTERECTOMY CAROTID WITH LIGATION OF INTERNAL CAROTID;  Surgeon: VSerafina Mitchell MD;  [facility]  Service: Vascular;  Laterality: Right;  . PATCH  ANGIOPLASTY Right 10/07/2016   Procedure: [place];  Surgeon: Serafina Mitchell, MD;  Location: [facility] OR;  Service: Vascular;  Laterality: Right;  . PROSTATECTOMY    . YAG LASER APPLICATION  12/45/8099   Procedure: YAG LASER APPLICATION;  Surgeon: Elta Guadeloupe T. Gershon Crane, MD;  Location: [facility];  Service: Ophthalmology;  Laterality: Right;     Home Medications:  Prior to Admission medications   Medication Sig Start Date End  Date Taking? Authorizing Provider  acetaminophen (TYLENOL) 650 MG CR tablet Take 650-1,300 mg by mouth daily as needed for pain.    Yes [provider]  aspirin 325 MG EC tablet Take 325 mg by mouth every morning.    Yes [provider]  aspirin EC 81 MG tablet Take 162 mg by mouth at bedtime.   Yes [provider]  carbidopa-levodopa (SINEMET) 25-100 MG per tablet Take 1 tablet by mouth 2 (two) times daily.    Yes [provider]  donepezil (ARICEPT) 5 MG tablet Take 5 mg by mouth at bedtime.   Yes [provider]  DULoxetine (CYMBALTA) 60 MG capsule Take 60 mg by mouth at bedtime.    Yes [provider]  folic acid (FOLVITE) 1 MG tablet Take 1 mg by mouth at bedtime.    Yes [provider]  furosemide (LASIX) 20 MG tablet Take 20 mg by mouth every morning.   Yes [provider]  hydrochlorothiazide (HYDRODIURIL) 25 MG tablet Take 25 mg by mouth every morning.    Yes [provider]  hydrOXYzine (ATARAX/VISTARIL) 25 MG tablet Take 25 mg by mouth at bedtime.   Yes [provider]  levothyroxine (SYNTHROID, LEVOTHROID) 88 MCG tablet Take 88 mcg by mouth daily before breakfast.   Yes [provider]  losartan (COZAAR) 100 MG tablet Take 100 mg by mouth at bedtime.   Yes [provider]  Multiple Vitamins-Minerals (MULTIVITAMIN WITH MINERALS) tablet Take 1 tablet by mouth every morning.    Yes [provider]  oxybutynin (DITROPAN) 5 MG tablet Take 5 mg by mouth 2 (two) times daily. 05/07/15  Yes [provider]  potassium citrate (UROCIT-K) 10 MEQ (1080 MG) SR tablet Take 1 tablet by mouth every morning. 05/26/18  Yes [provider]  rasagiline (AZILECT) 0.5 MG TABS Take 0.5 mg by mouth at bedtime.    Yes [provider]  simvastatin (ZOCOR) 10 MG tablet Take 10 mg by mouth at bedtime.     Yes [provider]    Inpatient Medications: Scheduled  Meds: . [MAR Hold] aspirin  81 mg Oral q morning - 10a  . [MAR Hold] carbidopa-levodopa  1 tablet Oral BID  . [MAR Hold] carvedilol  3.125 mg Oral BID WC  . [MAR Hold] donepezil  5 mg Oral QHS  . [MAR Hold] DULoxetine  60 mg Oral QHS  . [MAR Hold] folic acid  1 mg Oral QHS  . [MAR Hold] heparin injection (subcutaneous)  5,000 Units Subcutaneous Q8H  . [MAR Hold] levothyroxine  88 mcg Oral QAC breakfast  . [MAR Hold] losartan  50 mg Oral QHS  . [MAR Hold] multivitamin with minerals  1 tablet Oral q morning - 10a  . [MAR Hold] oxybutynin  5 mg Oral BID  . [MAR Hold] potassium chloride  30 mEq Oral Daily  . [MAR Hold] rasagiline  0.5 mg Oral QHS  . [MAR Hold] simvastatin  10 mg Oral QHS   Continuous Infusions:  PRN Meds: [MAR Hold] acetaminophen, [MAR Hold] alum & mag hydroxide-simeth, [MAR Hold] ondansetron (ZOFRAN) IV  Allergies:    Allergies  Allergen Reactions  . Ciprofloxacin Other (See Comments)    Patient just knows that the was told that he was allergic    Social History:   Social History   Socioeconomic History  . Marital status: Married    Spouse name: Not on file  . Number of children: Not on file  . Years of education: Not on file  . Highest education level: Not on file  Occupational History  . Not on file  Social Needs  . Financial resource strain: Not on file  . Food insecurity:    Worry: Not on file    Inability: Not on file  . Transportation needs:    Medical: Not on file    Non-medical: Not on file  Tobacco Use  . Smoking status: Former Smoker    Packs/day: 0.50    Years: 10.00    Pack years: 5.00    Types: Cigarettes    Last attempt to quit: 07/24/1983    Years since quitting: 34.9  . Smokeless tobacco: Never Used  Substance and Sexual Activity  . Alcohol use: No  . Drug use: No  . Sexual activity: Never  Lifestyle  . Physical activity:    Days per week: Not on file    Minutes per session: Not on file  . Stress: Not on file  Relationships   . Social connections:    Talks on phone: Not on file    Gets together: Not on file    Attends religious service: Not on file    Active member of club or organization: Not on file    Attends meetings of clubs or organizations: Not on file    Relationship status: Not on file  . Intimate partner violence:    Fear of current or ex partner: Not on file    Emotionally abused: Not on file    Physically abused: Not on file    Forced sexual activity: Not on file  Other Topics Concern  . Not on file  Social History Narrative  . Not on file     Family History:    Family History  Problem Relation Age of Onset  . Cancer Mother   . Cancer Father   . Anesthesia problems Neg Hx   . Hypotension Neg Hx   . Malignant hyperthermia Neg Hx   . Pseudochol deficiency Neg Hx       Review of Systems    General:  No chills, fever, night sweats or weight changes.  Cardiovascular:  No chest pain, orthopnea, palpitations, paroxysmal nocturnal dyspnea. Positive for dyspnea on exertion and edema.  Dermatological: No rash, lesions/masses Respiratory: No cough, dyspnea Urologic: No hematuria, dysuria Abdominal:   No nausea, vomiting, diarrhea, bright red blood per rectum, melena, or hematemesis Neurologic:  No visual changes, wkns, changes in mental status. All other systems reviewed and are otherwise negative except as noted above.  Physical Exam/Data    Vitals:   06/09/18 2014 06/09/18 2058 06/10/18 0637 06/10/18 0700  BP:  (!) 137/95 130/83   Pulse:  93 84   Resp:  20 16   Temp:  97.9 F (36.6 C) 97.7 F (36.5 C)   TempSrc:  Oral Oral   SpO2: 95% 99% 98%   Weight:    175 lb 0.7 oz (79.4 kg)  Height:        Intake/Output  Summary (Last 24 hours) at 06/10/2018 0932 Last data filed at 06/09/2018 2234 Gross per 24 hour  Intake 720 ml  Output 400 ml  Net 320 ml   Filed Weights   06/08/18 1742 06/08/18 1820 06/10/18 0700  Weight: 180 lb 1.9 oz (81.7 kg) 180 lb 1.9 oz (81.7 kg) 175 lb 0.7  oz (79.4 kg)   Body mass index is 26.23 kg/m.   General: Pleasant, elderly Caucasian male appearing in NAD Psych: Normal affect. Neuro: Alert and oriented X 3. Moves all extremities spontaneously. HEENT: Normal  Neck: Supple without bruits or JVD. Lungs:  Resp regular and unlabored, CTA without wheezing or rales. Heart: RRR no s3, s4, or murmurs. Abdomen: Soft, non-tender, non-distended, BS + x 4.  Extremities: No clubbing, cyanosis or edema. Compression stockings in place. DP/PT/Radials 2+ and equal bilaterally.   EKG:  The EKG was personally reviewed and demonstrates: Sinus tachycardia, HR 108, with q-waves along lateral leads and nonspecific IVCD.    Labs/Studies     Relevant CV Studies:  Echocardiogram: 09/2016 Study Conclusions  - Left ventricle: The cavity size was normal. Wall thickness was   increased in a pattern of mild LVH. There was moderate focal   basal hypertrophy of the septum. Systolic function was normal.   The estimated ejection fraction was in the range of 60% to 65%.   Wall motion was normal; there were no regional wall motion   abnormalities. Features are consistent with a pseudonormal left   ventricular filling pattern, with concomitant abnormal relaxation   and increased filling pressure (grade 2 diastolic dysfunction). - Aortic valve: Trileaflet; mildly to moderately calcified   leaflets. There was mild regurgitation. Peak gradient (S): 10 mm   Hg. - Mitral valve: Calcified annulus. Mildly thickened leaflets .   There was trivial regurgitation. - Right atrium: Central venous pressure (est): 3 mm Hg. - Atrial septum: There was increased thickness of the septum,   consistent with lipomatous hypertrophy. - Tricuspid valve: There was trivial regurgitation. - Pulmonary arteries: PA peak pressure: 23 mm Hg (S). - Pericardium, extracardiac: There was no pericardial effusion.  Impressions:  - Mild LVH with moderate basal septal hypertrophy and LVEF  60-65%.   Grade 2 diastolic dysfunction. MAC with mildly thickened mitral   leaflets and trivial mitral regurgitation. Moderately sclerotic   aortic valve with mild aortic regurgitation. Trivial tricuspid   regurgitation with PASP 23 mmHg.  Echocardiogram: 06/09/2018 Study Conclusions  - Left ventricle: The cavity size was normal. Wall thickness was   increased in a pattern of moderate LVH. Systolic function was   mildly to moderately reduced. The estimated ejection fraction was   = 40%. Features are consistent with a pseudonormal left   ventricular filling pattern, with concomitant abnormal relaxation   and increased filling pressure (grade 2 diastolic dysfunction).   Doppler parameters are consistent with high ventricular filling   pressure. - Regional wall motion abnormality: Hypokinesis of the apical   septal, apical lateral, and apical myocardium. - Aortic valve: Moderately calcified annulus. Moderately thickened   leaflets. There was mild to moderate regurgitation. Valve area   (VTI): 2.11 cm^2. Valve area (Vmax): 1.51 cm^2. - Mitral valve: Mildly calcified annulus. Mildly thickened leaflets   . There was mild regurgitation. There isa 5.8 x 8.2 mm echolucent   circular structure that is mobile adjacent to the MV subvavular   apparatus. This may represent a ruptured calcified portion of the   subvalvular apparatus, cannot exclude possible vegetation.  Consider TEE to further evaluate. - Left atrium: The atrium was mildly dilated. - Right atrium: The atrium was mildly dilated.  NST: 09/2016  The study is normal.  This is a low risk study.  The left ventricular ejection fraction is hyperdynamic (>65%).  There was no ST segment deviation noted during stress.   Normal resting and stress perfusion. No ischemia or infarction EF 67%  Laboratory Data:  Chemistry Recent Labs  Lab 06/08/18 1840 06/09/18 0457 06/10/18 0428  NA 140 139 139  K 3.5 3.2* 4.2  CL 103 101  102  CO2 _0 GLUCOSE 85 108* 87  BUN 30* 33* 43*  CREATININE 1.37* 1.44* 1.77*  CALCIUM 9.5 9.5 9.2  GFRNONAA 49* 46* 36*  GFRAA 57* 53* 42*  ANIONGAP _1 Recent Labs  Lab 06/08/18 1840  PROT 8.0  ALBUMIN 3.7  AST 24  ALT 8  ALKPHOS 81  BILITOT 0.5   Hematology Recent Labs  Lab 06/08/18 1840 06/09/18 0457  WBC 5.2 5.0  RBC 3.30* 3.40*  HGB 10.4* 10.5*  HCT 31.8* 32.5*  MCV 96.4 95.6  MCH 31.5 30.9  MCHC 32.7 32.3  RDW 14.9 14.7  PLT 187 188   Cardiac Enzymes Recent Labs  Lab 06/08/18 1840 06/08/18 2121  TROPONINI 0.15* 0.14*   No results for input(s): TROPIPOC in the last 168 hours.  BNP Recent Labs  Lab 06/08/18 1840  BNP 973.0*    DDimer No results for input(s): DDIMER in the last 168 hours.  Radiology/Studies:  Portable Chest 1 View  Result Date: 06/08/2018 CLINICAL DATA:  Shortness of breath EXAM: PORTABLE CHEST 1 VIEW COMPARISON:  Chest radiograph 03/04/2011 FINDINGS: There is marked cardiomegaly with central pulmonary vascular congestion and apical pulmonary venous diversion. No focal airspace consolidation. No pleural effusion or pneumothorax. IMPRESSION: Cardiomegaly and apical pulmonary venous diversion without overt pulmonary edema. Electronically Signed   By: Ulyses Jarred M.D.   On: 06/08/2018 21:52     Assessment & Plan    1. Acute on Chronic Combined Systolic and Diastolic CHF - presented with worsening dyspnea and edema, found to have an acute CHF exacerbation with BNP elevated to 973. He has diuresed well with IV Lasix with a weight loss of 5 lbs and appears close to euvolemic by examination. Given his up-trending creatinine (1.37 on admission to 1.77 today), would hold IV Lasix and switch back to PO Lasix.  - echo this admission shows a newly reduced EF of 40% when compared to prior imaging in 2017. Troponin values have been flat at 0.15 and 0.14 which is likely secondary to demand ischemia in the setting of his CHF  exacerbation. Would focus on medical management at this time and consider further ischemic evaluation as an outpatient once this can be discussed with his family in more detail.  - continue ASA, Losartan, and newly started Coreg 3.169m BID.   2. Mitral Valve Abnormality - Transthoracic Echocardiogram shows a  5.8 x 8.2 mm echolucent circular structure that is mobile adjacent to the MV subvavular apparatus which may represent a ruptured calcified portion of the subvalvular apparatus, cannot exclude possible vegetation. TEE was recommended for further evaluation. Blood cultures have been negative.  - After careful review of history and examination, the risks and benefits of transesophageal echocardiogram have been explained including risks of esophageal damage, perforation (1:10,000 risk), bleeding, pharyngeal hematoma as well as other potential complications associated with conscious sedation including aspiration, arrhythmia, respiratory  failure and death. Alternatives to treatment were discussed, questions were answered. Patient is willing to proceed. This was also reviewed with the patient's wife given his baseline dementia and she granted consent as well. This has been documented and placed in the patient's chart.   3. HTN - BP has overall been stable at 123/81 - 137/95 within the past 24 hours.  - continue Losartan 8m daily and newly-started Coreg 3.1256mBID.   4. HLD - followed by PCP. Continue PTA Simvastatin 2039maily.    5. Carotid Artery Stenosis - s/p R CEA in 09/2016. Continue statin and BB therapy.  - continue ASA and statin therapy. Will reduce ASA to 40m42mily.   6. Dementia - the patient is a poor historian but is A&Ox3 at the time of this encounter.     For questions or updates, please contact [facility] consult [email or web address] for contact info under Cardiology/STEMI.  Signed, BritErma Heritage-C 06/10/2018, 9:32 AM Pager: [phone]  note Patients seen and discussed with PA StraAhmed Primaagree with her documentation above. 76 y66male history of carotid stenosis, Parkonsinsm, PAD admitted with SOB. Patient is poor historian. He does endorse some recent SOB over the last few days.    Admit labs Mg 1.9 BNP 973 Cr 1.37 BUN 30 WBC 5.2 Hgb 10.4 TSH 3.7  Trop 0.15--> 0.14--> Blood cultures neg x 2 CXR cardiomegaly EKG sinus tach, lateral Qwaves old Echo LVEF 40%, density attached to MV subvalvular apparatus  Patient presents with acute systolic HF, LVEF down to 40%. Negative 1.3 L per charting, significant uptrend in Cr would hold diuretics at this time. Medical therapy with coreg 3.125mg108m, losartan 50. Continue at current doses, if renal function continues to worsen hold losartan. Mild flat troponin in setting of CHF and renal dysfunction, trend is not consistent with ACS. With drop in LVEF would be concerned about chronic obstructive CAD. The utility of ischemic testing at this time is unclear to me. He seems to have fairly significant dementia, certaintly with his AKI that would prohibit invasive testing at this time. For now would recommend titration of medical therapy for his systolic HF, there is not an urgent indication for ischemic testing and really need to get a hold on his underlying functional capacity and also family wishes as well. Would plan to optimize medical therapy at this time and reconsider ischemic testing on outpatient basis.   Patient also with MV subvalvular density of unclear etiology, we will plan for TEE today to further evalaute. Clinical presentation not consistent with endocarditis, cultures neg thus far.   JonatCarlyle Dolly

## 2018-06-10 NOTE — Care Management Note (Signed)
Case Management Note  Patient Details  Name: [name] MRN: [id number] Date of Birth: 1941/10/03  Subjective/Objective:      Admitted with CHF. Pt being diuresed and undergoing TEE today. Pt demented, information received from wife via phone. Pt lives with wife, daughter, granddaughter and son (who is disabled). Pt receives assistance with ADL's. He uses cane/walker with ambulation. He has no drove since Christmas 2018. Wife requests HH [facility], requests [facility].             Action/Plan: DC home with [facility]. Wife aware HH has 48 hrs to make first visit. CM has given referral to [place], [facility] rep.   Expected Discharge Date:  06/11/18               Expected Discharge Plan:  [place]  In-House Referral:  NA  Discharge planning Services  CM Consult  Post Acute Care Choice:  Home Health Choice offered to:  Spouse  HH Arranged:  RN, PT [facility] Agency:  [facility]  Status of Service:  Completed, signed off  Sherald Barge, RN 06/10/2018, 1:00 PM

## 2018-06-10 NOTE — H&P (Signed)
Full cardiology consult note to follow regarding patients SOB and decrease LVEF. The following is preprocedure H&P for TEE. Patient with TTE with density attached to MV subvalvular apparatus, plan for TEE today to further evaluate. Please refer to referenced H&P below for full history.    Carlyle Dolly MD   History and Physical  Joziyah Roblero EYC:[id number] DOB: 10-02-1941 DOA: 06/08/2018  Referring physician: Patient's PCP, Dr. Allyn Kenner PCP: Celene Squibb, MD  Outpatient Specialists:    Patient coming from: Home  Chief Complaint: SOB  HPI:  Patient is a 77 year old Caucasian male with past medical history significant for coronary artery disease, prior history of MI, peripheral vascular disease, history of right-sided carotid artery stenosis status post endarterectomy 2017, Parkinson's disease, hypertension, grade 2 diastolic dysfunction, right-sided hemispheric CVA, GERD, anxiety, chronic kidney disease stage III, normocytic anemia and possible history of dementing illness.  Apparently, patient is on donepezil.  Patient is an extremely poor historian.  Patient tells me that he was sent in by the primary care provider to have his legs cut off!  Collateral information reveals that the patient was sent in for shortness of breath, chest pain and leg edema.  Patient's history could not be trusted.  Patient was seen alongside patient's brother.  Unfortunately, patient's brother's history could not be trusted neither.  From what I could gather, the patient reported 5-6 pillow orthopnea, intermittent chest pain that seemed atypical in nature, rated 5 out of 10, with no associated nausea or diaphoresis.  The patient reported 3 months' history of progressive leg edema.  Apparently, patient was seen by the primary care provider and asked to come into the hospital for further assessment and management.  No headache, no neck pain, no fever or chills, no URI symptoms, no GI symptoms and no urinary  symptoms.  Patient will be admitted for further assessment and management.  ED Course: Patient was admitted directly to the medical floor. Pertinent labs: No labs available.   EKG: No EKG available.   Imaging: None available.    Review of Systems:  As in the history of presenting illness.  As mentioned above, patient is a poor historian. Negative for fever, visual changes, sore throat, rash, new muscle aches, dysuria, bleeding, n/v/abdominal pain.      Past Medical History:  Diagnosis Date  . Anxiety   . GERD (gastroesophageal reflux disease)   . Hyperlipidemia   . Hypertension   . Myocardial infarction ([place])   . Neuromuscular disorder ([place])   . Parkinson's disease ([place])   . Peripheral vascular disease [facility])          Past Surgical History:  Procedure Laterality Date  . BLADDER REPAIR     pt sts "when I had my prostatectomy they cut too much and I have an internal button I have to press in order to release my urine"  . BLADDER SURGERY     2010  pump placed   . CATARACT EXTRACTION W/PHACO  07/28/2011   Procedure: CATARACT EXTRACTION PHACO AND INTRAOCULAR LENS PLACEMENT (IOC);  Surgeon: Elta Guadeloupe T. Gershon Crane;  Location: [facility];  Service: Ophthalmology;  Laterality: Right;  CDE: 20.26  . CATARACT EXTRACTION W/PHACO  09/08/2011   Procedure: CATARACT EXTRACTION PHACO AND INTRAOCULAR LENS PLACEMENT (IOC);  Surgeon: Elta Guadeloupe T. Gershon Crane;  Location: [facility];  Service: Ophthalmology;  Laterality: Left;  CDE:37.31  . ENDARTERECTOMY Right 10/07/2016   Procedure: RIGHT ENDARTERECTOMY CAROTID WITH LIGATION OF INTERNAL CAROTID;  Surgeon: Serafina Mitchell, MD;  Location:  [facility];  Service: Vascular;  Laterality: Right;  . PATCH ANGIOPLASTY Right 10/07/2016   Procedure: [place];  Surgeon: Serafina Mitchell, MD;  Location: [place];  Service: Vascular;  Laterality: Right;  . PROSTATECTOMY    . YAG LASER APPLICATION  44/81/8563   Procedure: YAG  LASER APPLICATION;  Surgeon: Elta Guadeloupe T. Gershon Crane, MD;  Location: [facility];  Service: Ophthalmology;  Laterality: Right;     reports that he quit smoking about 34 years ago. His smoking use included cigarettes. He has a 5.00 pack-year smoking history. He has never used smokeless tobacco. He reports that he does not drink alcohol or use drugs.       Allergies  Allergen Reactions  . Ciprofloxacin Other (See Comments)    Patient just knows that the was told that he was allergic         Family History  Problem Relation Age of Onset  . Cancer Mother   . Cancer Father   . Anesthesia problems Neg Hx   . Hypotension Neg Hx   . Malignant hyperthermia Neg Hx   . Pseudochol deficiency Neg Hx             Prior to Admission medications   Medication Sig Start Date End Date Taking? Authorizing Provider  acetaminophen (TYLENOL) 650 MG CR tablet Take 650-1,300 mg by mouth daily as needed for pain.    Yes [provider]  aspirin 325 MG EC tablet Take 325 mg by mouth every morning.    Yes [provider]  aspirin EC 81 MG tablet Take 162 mg by mouth at bedtime.   Yes [provider]  carbidopa-levodopa (SINEMET) 25-100 MG per tablet Take 1 tablet by mouth 2 (two) times daily.    Yes [provider]  donepezil (ARICEPT) 5 MG tablet Take 5 mg by mouth at bedtime.   Yes [provider]  DULoxetine (CYMBALTA) 60 MG capsule Take 60 mg by mouth at bedtime.    Yes [provider]  folic acid (FOLVITE) 1 MG tablet Take 1 mg by mouth at bedtime.    Yes [provider]  furosemide (LASIX) 20 MG tablet Take 20 mg by mouth every morning.   Yes [provider]  hydrochlorothiazide (HYDRODIURIL) 25 MG tablet Take 25 mg by mouth every morning.    Yes [provider]  hydrOXYzine (ATARAX/VISTARIL) 25 MG tablet Take 25 mg by mouth at bedtime.   Yes [provider]  levothyroxine (SYNTHROID,  LEVOTHROID) 88 MCG tablet Take 88 mcg by mouth daily before breakfast.   Yes [provider]  losartan (COZAAR) 100 MG tablet Take 100 mg by mouth at bedtime.   Yes [provider]  Multiple Vitamins-Minerals (MULTIVITAMIN WITH MINERALS) tablet Take 1 tablet by mouth every morning.    Yes [provider]  oxybutynin (DITROPAN) 5 MG tablet Take 5 mg by mouth 2 (two) times daily. 05/07/15  Yes [provider]  potassium citrate (UROCIT-K) 10 MEQ (1080 MG) SR tablet Take 1 tablet by mouth every morning. 05/26/18  Yes [provider]  rasagiline (AZILECT) 0.5 MG TABS Take 0.5 mg by mouth at bedtime.    Yes [provider]  simvastatin (ZOCOR) 10 MG tablet Take 10 mg by mouth at bedtime.     Yes [provider]    Physical Exam:    Vitals:   06/08/18 1742  BP: 140/65  Pulse: 93  Resp: (!) 24  Temp: 98.4 F (36.9 C)  TempSrc: Oral  SpO2: 95%  Weight: 81.7 kg (180 lb 1.9 oz)    Constitutional:   Appears calm and comfortable Eyes:   Pallor. No jaundice.  ENMT:   external ears, nose appear normal. Tongue is dry Neck:   Neck is supple. JVD is equivocal. Respiratory:   CTA bilaterally, no w/r/r.   Respiratory effort normal. No retractions or accessory muscle use Cardiovascular:   D5H2, Systolic murmur (??ESM), possible diastolic murmur  2+ to 3 bilateral lower extremity edema.    Abdomen:   Abdomen is soft and non tender. Organs are difficult to assess. Neurologic:   Awake and alert.  Moves all limbs.     Wt Readings from Last 3 Encounters:  06/08/18 81.7 kg (180 lb 1.9 oz)  01/11/17 95.3 kg (210 lb)  10/09/16 95.7 kg (210 lb 14.4 oz)    I have personally reviewed following labs and imaging studies  Labs on Admission:  CBC: Last Labs   No results for input(s): WBC, NEUTROABS, HGB, HCT, MCV, PLT in the last 168 hours.   Basic Metabolic Panel: Last Labs   No results for  input(s): NA, K, CL, CO2, GLUCOSE, BUN, CREATININE, CALCIUM, MG, PHOS in the last 168 hours.   Liver Function Tests: Last Labs   No results for input(s): AST, ALT, ALKPHOS, BILITOT, PROT, ALBUMIN in the last 168 hours.   Last Labs   No results for input(s): LIPASE, AMYLASE in the last 168 hours.   Last Labs   No results for input(s): AMMONIA in the last 168 hours.   Coagulation Profile: Last Labs   No results for input(s): INR, PROTIME in the last 168 hours.   Cardiac Enzymes: Last Labs   No results for input(s): CKTOTAL, CKMB, CKMBINDEX, TROPONINI in the last 168 hours.   BNP (last 3 results) Recent Labs (within last 365 days)  No results for input(s): PROBNP in the last 8760 hours.   HbA1C: Recent Labs (last 2 labs)   No results for input(s): HGBA1C in the last 72 hours.   CBG: Last Labs   No results for input(s): GLUCAP in the last 168 hours.   Lipid Profile: Recent Labs (last 2 labs)   No results for input(s): CHOL, HDL, LDLCALC, TRIG, CHOLHDL, LDLDIRECT in the last 72 hours.   Thyroid Function Tests: Recent Labs (last 2 labs)   No results for input(s): TSH, T4TOTAL, FREET4, T3FREE, THYROIDAB in the last 72 hours.   Anemia Panel: Recent Labs (last 2 labs)   No results for input(s): VITAMINB12, FOLATE, FERRITIN, TIBC, IRON, RETICCTPCT in the last 72 hours.   Urine analysis: Labs (Brief)          Component Value Date/Time   COLORURINE YELLOW 10/06/2016 [place] 10/06/2016 0907   LABSPEC 1.018 10/06/2016 0907   PHURINE 5.5 10/06/2016 0907   GLUCOSEU NEGATIVE 10/06/2016 0907   HGBUR NEGATIVE 10/06/2016 0907   BILIRUBINUR NEGATIVE 10/06/2016 0907   KETONESUR NEGATIVE 10/06/2016 0907   PROTEINUR NEGATIVE 10/06/2016 0907   NITRITE NEGATIVE 10/06/2016 0907   LEUKOCYTESUR NEGATIVE 10/06/2016 0907     Sepsis Labs: @LABRCNTIP (procalcitonin:4,lacticidven:4) )No results found for this or any previous visit (from the past 240  hour(s)).    Radiological Exams on Admission: Imaging Results (Last 48 hours)  No results found.     Active Problems:   SOB (shortness of breath)   Assessment/Plan 1. SOB 2. Chest pain 3. Possible acute on chronic diastolic  congestive heart failure. 4. Possible dementia. 5. Parkinson's disease. 6. History of peripheral vascular disease/carotid artery disease (stenosis) status post endarterectomy. 7. Hypertension 8. Anemia 9. Chronic kidney disease stage III    Admit patient for further assessment and management  Stat labs (CMP, magnesium, phosphorus, cardiac enzymes, BNP, TSH)  Echocardiogram.  Chest x-ray.  IV diuretics.  Consult physical therapy.  Monitor renal function and electrolytes.  Urinalysis.  Further management will depend on hospital course. DVT prophylaxis: Subcutaneous Lovenox Code Status: Full Family Communication: [name] Disposition Plan: To be determined Consults called: Blood pressure to consult cardiology Admission status: Inpatient  Time spent: 65 minutes.  Dana Allan, MD  [facility]

## 2018-06-11 LAB — BASIC METABOLIC PANEL
ANION GAP: 10 (ref 5–15)
BUN: 41 mg/dL — ABNORMAL HIGH (ref 8–23)
CO2: 25 mmol/L (ref 22–32)
CREATININE: 1.47 mg/dL — AB (ref 0.61–1.24)
Calcium: 9.2 mg/dL (ref 8.9–10.3)
Chloride: 102 mmol/L (ref 98–111)
GFR calc non Af Amer: 45 mL/min — ABNORMAL LOW (ref >60)
GFR, EST AFRICAN AMERICAN: 52 mL/min — AB (ref >60)
Glucose, Bld: 84 mg/dL (ref 70–99)
Potassium: 4.3 mmol/L (ref 3.5–5.1)
SODIUM: 137 mmol/L (ref 135–145)

## 2018-06-11 MED ORDER — FUROSEMIDE 40 MG PO TABS
40.0000 mg | ORAL_TABLET | Freq: Every day | ORAL | Status: DC
  Administered 2018-06-12: 40 mg via ORAL
  Filled 2018-06-11: qty 1

## 2018-06-11 NOTE — Progress Notes (Signed)
PROGRESS NOTE    [name]  JJO:[id number] DOB: 1941-08-01 DOA: 06/08/2018 PCP: Celene Squibb, MD    Brief Narrative:  77 y/o with extensive PMH sent to ED by PCP due to SOB, orthopnea, increase LE swelling and elevated BNP. Found to have acute on chronic CHF (combined).  Assessment & Plan: 1-acute on chronic combined heart failure: -patient echo demonstrating hypokinesis and decrease EF (40% and with grade 2 diastolic dysfunction) -Fluid overload status continue improving, patient is less short of breath.   -cardiology consulted and will follow him closely as an outpatient. -troponin mildly elevated; no chest pain. Most likely demand ischemia in the setting of CHF exacerbation.  After discussing with cardiology service no acute ischemia work-up will be pursued at this moment. -Follow daily weights, low-sodium diet and a strict intake and output. -Lasix PO and follow response. Most likely home in am -low sodium diet  -continue low dose coreg and losartan. -neg 2.5 L since admission   2-HTN -BP is stable and well controlled -will monitor VS  3-hypothyroidism  -continue synthroid  -TSH WNL  4-hx of parkinson's disease -continue carbidopa  5-dementia with behavioral disturbances  -continue supportive care -continue aricept and cymbalta  -mood is stable.  6-HLD -continue zocor   7-CKD stage 3 -Cr up to 1.7 with IV diuretics; now down to 1.4 (which is baseline) -lasix PO started and will follow BMET in AM again -follow trend   DVT prophylaxis: heparin  Code Status: Full code. Family Communication: no family at bedside  Disposition Plan: remains inpatient, continue beta-blocker, losartan and oral diuretics.  Most likely discharge home in a.m.  Follow-up with cardiology service.  Consultants:   Cardiology   Procedures:   2-D echo - Left ventricle: The cavity size was normal. Wall thickness was   increased in a pattern of moderate LVH. Systolic function was  mildly to moderately reduced. The estimated ejection fraction was   = 40%. Features are consistent with a pseudonormal left   ventricular filling pattern, with concomitant abnormal relaxation   and increased filling pressure (grade 2 diastolic dysfunction).   Doppler parameters are consistent with high ventricular filling   pressure. - Regional wall motion abnormality: Hypokinesis of the apical   septal, apical lateral, and apical myocardium. - Aortic valve: Moderately calcified annulus. Moderately thickened   leaflets. There was mild to moderate regurgitation. Valve area   (VTI): 2.11 cm^2. Valve area (Vmax): 1.51 cm^2. - Mitral valve: Mildly calcified annulus. Mildly thickened leaflets   . There was mild regurgitation. There isa 5.8 x 8.2 mm echolucent   circular structure that is mobile adjacent to the MV subvavular   apparatus. This may represent a ruptured calcified portion of the   subvalvular apparatus, cannot exclude possible vegetation.   Consider TEE to further evaluate. - Left atrium: The atrium was mildly dilated. - Right atrium: The atrium was mildly dilated.   TEE: has rule out vegetation; patient with positive chordae tendinae rupture on his MV.  Antimicrobials:  Anti-infectives (From admission, onward)   None     Subjective: Afebrile, no chest pain; reporting overall improvement in his breathing even is still short of breath with minimal exertion.  No nausea, no vomiting.  Trace edema appreciated on exam bilaterally.  Objective: Vitals:   06/10/18 2115 06/11/18 0636 06/11/18 0645 06/11/18 1400  BP: (!) 142/85 (!) 119/95  (!) 151/89  Pulse: (!) 101 (!) 102  92  Resp: 17 19  18   Temp: 97.7 F (36.5  C) 97.7 F (36.5 C)  97.9 F (36.6 C)  TempSrc: Oral Oral    SpO2: 90% 93%  99%  Weight:   80.3 kg (177 lb 0.5 oz)   Height:        Intake/Output Summary (Last 24 hours) at 06/11/2018 1831 Last data filed at 06/11/2018 0900 Gross per 24 hour  Intake 480 ml    Output 900 ml  Net -420 ml   Filed Weights   06/08/18 1820 06/10/18 0700 06/11/18 0645  Weight: 81.7 kg (180 lb 1.9 oz) 79.4 kg (175 lb 0.7 oz) 80.3 kg (177 lb 0.5 oz)    Examination: General exam: Alert, awake, oriented x 3 currently; denies chest pain, express having some shortness of breath but orbital improved. Respiratory system: No frank crackles, no using accessory muscles, respiratory effort is normal.  Improved air movement bilaterally.   Cardiovascular system:Rate controlled; positive SEM, no rubs, no gallops. Gastrointestinal system: Abdomen is nondistended, soft and nontender. No organomegaly or masses felt. Normal bowel sounds heard. Central nervous system: Alert and oriented. No focal neurological deficits. Extremities: trace edema bilaterally; TED hoses in place. Skin: No rashes, lesions or ulcers Psychiatry: no agitation. Insight impaired due to underlying dementia.    Data Reviewed: I have personally reviewed following labs and imaging studies  CBC: Recent Labs  Lab 06/08/18 1840 06/09/18 0457  WBC 5.2 5.0  NEUTROABS 3.0  --   HGB 10.4* 10.5*  HCT 31.8* 32.5*  MCV 96.4 95.6  PLT 187 735   Basic Metabolic Panel: Recent Labs  Lab 06/08/18 1840 06/09/18 0457 06/10/18 0428 06/11/18 0453  NA 140 139 139 137  K 3.5 3.2* 4.2 4.3  CL 103 101 102 102  CO2 25 27 26 25   GLUCOSE 85 108* 87 84  BUN 30* 33* 43* 41*  CREATININE 1.37* 1.44* 1.77* 1.47*  CALCIUM 9.5 9.5 9.2 9.2  MG 1.9  --   --   --   PHOS 4.7*  --   --   --    GFR: Estimated Creatinine Clearance: 42.7 mL/min (A) (by C-G formula based on SCr of 1.47 mg/dL (H)).   Liver Function Tests: Recent Labs  Lab 06/08/18 1840  AST 24  ALT 8  ALKPHOS 81  BILITOT 0.5  PROT 8.0  ALBUMIN 3.7   Cardiac Enzymes: Recent Labs  Lab 06/08/18 1840 06/08/18 2121  TROPONINI 0.15* 0.14*   Thyroid Function Tests: Recent Labs    06/08/18 1840  TSH 3.733   Urine analysis:    Component Value  Date/Time   COLORURINE COLORLESS (A) 06/08/2018 1814   APPEARANCEUR CLEAR 06/08/2018 1814   LABSPEC 1.005 06/08/2018 1814   PHURINE 7.0 06/08/2018 1814   GLUCOSEU NEGATIVE 06/08/2018 1814   HGBUR NEGATIVE 06/08/2018 1814   BILIRUBINUR NEGATIVE 06/08/2018 1814   KETONESUR NEGATIVE 06/08/2018 1814   PROTEINUR NEGATIVE 06/08/2018 1814   NITRITE NEGATIVE 06/08/2018 1814   LEUKOCYTESUR NEGATIVE 06/08/2018 1814    Recent Results (from the past 240 hour(s))  Culture, blood (Routine X 2) w Reflex to ID Panel     Status: None (Preliminary result)   Collection Time: 06/09/18 11:56 AM  Result Value Ref Range Status   Specimen Description BLOOD RIGHT ARM  Final   Special Requests   Final    BOTTLES DRAWN AEROBIC AND ANAEROBIC Blood Culture adequate volume   Culture   Final    NO GROWTH 2 DAYS Performed at [facility], [address] [place], [place] [address]  Report Status PENDING  Incomplete  Culture, blood (Routine X 2) w Reflex to ID Panel     Status: None (Preliminary result)   Collection Time: 06/09/18 11:56 AM  Result Value Ref Range Status   Specimen Description BLOOD LEFT ARM  Final   Special Requests   Final    BOTTLES DRAWN AEROBIC AND ANAEROBIC Blood Culture adequate volume   Culture   Final    NO GROWTH 2 DAYS Performed at [facility], [address], [place], [place] [address]    Report Status PENDING  Incomplete     Radiology Studies: No results found.  Scheduled Meds: . aspirin  81 mg Oral q morning - 10a  . carbidopa-levodopa  1 tablet Oral BID  . carvedilol  3.125 mg Oral BID WC  . donepezil  5 mg Oral QHS  . DULoxetine  60 mg Oral QHS  . folic acid  1 mg Oral QHS  . heparin injection (subcutaneous)  5,000 Units Subcutaneous Q8H  . levothyroxine  88 mcg Oral QAC breakfast  . losartan  50 mg Oral QHS  . multivitamin with minerals  1 tablet Oral q morning - 10a  . oxybutynin  5 mg Oral BID  . potassium chloride  30 mEq Oral Daily  . rasagiline  0.5 mg  Oral QHS  . simvastatin  10 mg Oral QHS   Continuous Infusions:   LOS: 3 days    Time spent: 30 minutes    Barton Dubois, MD [facility] Pager [phone]  If 7PM-7AM, please contact night-coverage [email or web address] Password [facility] 06/11/2018, 6:31 PM

## 2018-06-12 DIAGNOSIS — I5043 Acute on chronic combined systolic (congestive) and diastolic (congestive) heart failure: Secondary | ICD-10-CM

## 2018-06-12 DIAGNOSIS — E785 Hyperlipidemia, unspecified: Secondary | ICD-10-CM

## 2018-06-12 DIAGNOSIS — R7989 Other specified abnormal findings of blood chemistry: Secondary | ICD-10-CM

## 2018-06-12 DIAGNOSIS — N1831 Chronic kidney disease, stage 3a: Secondary | ICD-10-CM

## 2018-06-12 DIAGNOSIS — N183 Chronic kidney disease, stage 3 (moderate): Secondary | ICD-10-CM

## 2018-06-12 DIAGNOSIS — R778 Other specified abnormalities of plasma proteins: Secondary | ICD-10-CM

## 2018-06-12 DIAGNOSIS — I10 Essential (primary) hypertension: Secondary | ICD-10-CM

## 2018-06-12 DIAGNOSIS — E876 Hypokalemia: Secondary | ICD-10-CM

## 2018-06-12 DIAGNOSIS — F0281 Dementia in other diseases classified elsewhere with behavioral disturbance: Secondary | ICD-10-CM

## 2018-06-12 DIAGNOSIS — G20 Parkinson's disease: Secondary | ICD-10-CM

## 2018-06-12 DIAGNOSIS — F02818 Dementia in other diseases classified elsewhere, unspecified severity, with other behavioral disturbance: Secondary | ICD-10-CM

## 2018-06-12 LAB — BASIC METABOLIC PANEL
ANION GAP: 8 (ref 5–15)
BUN: 29 mg/dL — ABNORMAL HIGH (ref 8–23)
CHLORIDE: 104 mmol/L (ref 98–111)
CO2: 23 mmol/L (ref 22–32)
Calcium: 9.4 mg/dL (ref 8.9–10.3)
Creatinine, Ser: 1.11 mg/dL (ref 0.61–1.24)
GFR calc non Af Amer: >60 mL/min (ref >60)
GLUCOSE: 93 mg/dL (ref 70–99)
POTASSIUM: 4.4 mmol/L (ref 3.5–5.1)
Sodium: 135 mmol/L (ref 135–145)

## 2018-06-12 MED ORDER — CARVEDILOL 6.25 MG PO TABS: 6.2500 mg | ORAL_TABLET | Freq: Two times a day (BID) | ORAL | 1 refills | Status: DC

## 2018-06-12 MED ORDER — POTASSIUM CITRATE ER 10 MEQ (1080 MG) PO TBCR: 20.0000 meq | EXTENDED_RELEASE_TABLET | Freq: Every morning | ORAL | 0 refills | Status: DC

## 2018-06-12 MED ORDER — FUROSEMIDE 40 MG PO TABS: 40.0000 mg | ORAL_TABLET | Freq: Every morning | ORAL | 1 refills | Status: DC

## 2018-06-12 MED ORDER — LOSARTAN POTASSIUM 50 MG PO TABS: 50.0000 mg | ORAL_TABLET | Freq: Every day | ORAL | 1 refills | Status: DC

## 2018-06-12 NOTE — Discharge Summary (Signed)
Physician Discharge Summary  [name] QQP:[id number] DOB: 17-Oct-1941 DOA: 06/08/2018  PCP: Ernest Squibb, MD  Admit date: 06/08/2018 Discharge date: 06/12/2018  Time spent: 35 minutes  Recommendations for Outpatient Follow-up:  1. Repeat BMET to follow up electrolytes and renal function  2. Reassess BP/volume status and further adjust medication as needed. 3. -patient to follow up with cardiology service as an outpatient.   Discharge Diagnoses:  Active Problems:   SOB (shortness of breath)   Acute on chronic combined systolic and diastolic HF (heart failure) (HCC)   Dementia due to Parkinson's disease with behavioral disturbance (HCC)   Hyperlipidemia   Essential hypertension   CKD (chronic kidney disease) stage 3, GFR 30-59 ml/min (HCC)   Elevated troponin   Hypokalemia   Discharge Condition: stable and improved. Discharge home with instructions to follow up with PCP and with cardiology service as an outpatient.  Diet recommendation: heart healthy diet   Filed Weights   06/10/18 0700 06/11/18 0645 06/12/18 0701  Weight: 79.4 kg (175 lb 0.7 oz) 80.3 kg (177 lb 0.5 oz) 78.5 kg (173 lb 1 oz)    History of present illness:  As per H&P written by Dr. Marthenia Long on 06/08/18 77 year old Caucasian male with past medical history significant for coronary artery disease, prior history of MI, peripheral vascular disease, history of right-sided carotid artery stenosis status post endarterectomy 2017, Parkinson's disease, hypertension, grade 2 diastolic dysfunction, right-sided hemispheric CVA, GERD, anxiety, chronic kidney disease stage III, normocytic anemiaandpossible history of dementing illness. Apparently, patient is on donepezil. Patient is an extremely poor historian. Patient tells me that he was sent in by the primary care provider to have his legs cut off! Collateral information reveals that the patient was sent in for shortness of breath, chest pain and leg edema. Patient's  history could not be trusted. Patient was seen alongside patient's brother. Unfortunately, patient's brother's history could not be trusted neither. From what I could gather, the patient reported 5-6 pillow orthopnea, intermittent chest pain that seemed atypical in nature, rated 5 out of 10, with no associated nausea or diaphoresis. The patient reported 3 months'history of progressive leg edema. Apparently, patient was seen by the primary care provider and asked to come into the hospital for further assessment and management. No headache, no neck pain, no fever or chills, no URI symptoms, no GI symptoms and no urinary symptoms. Patient will be admitted for further assessment and management.   Hospital Course:  1-acute on chronic combined heart failure: -patient echo demonstrating hypokinesis and decrease EF (40% and with grade 2 diastolic dysfunction) -volume status now compensated.  -cardiology consulted and will follow him closely as an outpatient. -troponin mildly elevated; no chest pain. Most likely demand ischemia in the setting of CHF exacerbation.  After discussing with cardiology service no acute ischemic work-up will be pursued at this moment. -patient will follow up with cardiology service as an outpatient. -instructed to check weight on daily basis  -Lasix 40mg  by mouth daily at discharge (well tolerated) -low sodium diet emphasized  -continue low dose coreg and losartan. -neg 3.2 L since admission   2-HTN -BP is stable and well controlled -heart healthy diet encouraged   3-hypothyroidism  -continue synthroid  -TSH WNL  4-hx of parkinson's disease -continue carbidopa and outpatient follow up.   5-dementia with behavioral disturbances  -continue supportive care -continue aricept and cymbalta  -mood is stable and appears to be at baseline.  6-HLD -continue zocor   7-CKD stage 3 -Cr  up to 1.7 with IV diuretics; now down to 1.11 (which is baseline) -lasix PO  started and will recommend BMET as an outpatient to follow trend    8-physical deconditioning  -HHPT ordered to facilitate transition of care.   Procedures:   2-D echo - Left ventricle: The cavity size was normal. Wall thickness was increased in a pattern of moderate LVH. Systolic function was mildly to moderately reduced. The estimated ejection fraction was = 40%. Features are consistent with a pseudonormal left ventricular filling pattern, with concomitant abnormal relaxation and increased filling pressure (grade 2 diastolic dysfunction). Doppler parameters are consistent with high ventricular filling pressure. - Regional wall motion abnormality: Hypokinesis of the apical septal, apical lateral, and apical myocardium. - Aortic valve: Moderately calcified annulus. Moderately thickened leaflets. There was mild to moderate regurgitation. Valve area (VTI): 2.11 cm^2. Valve area (Vmax): 1.51 cm^2. - Mitral valve: Mildly calcified annulus. Mildly thickened leaflets . There was mild regurgitation. There isa 5.8 x 8.2 mm echolucent circular structure that is mobile adjacent to the MV subvavular apparatus. This may represent a ruptured calcified portion of the subvalvular apparatus, cannot exclude possible vegetation. Consider TEE to further evaluate. - Left atrium: The atrium was mildly dilated. - Right atrium: The atrium was mildly dilated.   TEE: has rule out vegetation; patient with positive chordae tendinae rupture on his MV.  Consultations:  Cardiology   Discharge Exam: Vitals:   06/12/18 0754 06/12/18 1009  BP:  133/84  Pulse:  84  Resp:    Temp:  98.6 F (37 C)  SpO2: 94% 96%    General exam: Alert, awake, oriented x 3 currently; denies chest pain, express having some shortness of breath with exertion, but much improved overall. No orthopnea.  Respiratory system: No frank crackles, no using accessory muscles, respiratory effort is  normal.  Improved air movement bilaterally.   Cardiovascular system:Rate controlled; positive SEM, no rubs, no gallops. Gastrointestinal system: Abdomen is nondistended, soft and nontender. No organomegaly or masses felt. Normal bowel sounds heard. Central nervous system: Alert and oriented. No focal neurological deficits. Extremities: no LE edema; TED hoses in place. Skin: No rashes, lesions or ulcers Psychiatry: no agitation. Insight impaired due to underlying dementia.    Discharge Instructions   Discharge Instructions    Diet - low sodium heart healthy   Complete by:  As directed    Discharge instructions   Complete by:  As directed    Follow low sodium diet  (less than 2 gram daily) Take medications as prescribed  Follow up with cardiology as instructed (office will contact you with appointment details) Follow up with PCP in 10 days     Allergies as of 06/12/2018      Reactions   Ciprofloxacin Other (See Comments)   Patient just knows that the was told that he was allergic      Medication List    STOP taking these medications   hydrochlorothiazide 25 MG tablet Commonly known as:  HYDRODIURIL     TAKE these medications   acetaminophen 650 MG CR tablet Commonly known as:  TYLENOL Take 650-1,300 mg by mouth daily as needed for pain.   aspirin 325 MG EC tablet Take 325 mg by mouth every morning. What changed:  Another medication with the same name was removed. Continue taking this medication, and follow the directions you see here.   carbidopa-levodopa 25-100 MG tablet Commonly known as:  SINEMET IR Take 1 tablet by mouth 2 (two) times daily.  carvedilol 6.25 MG tablet Commonly known as:  COREG Take 1 tablet (6.25 mg total) by mouth 2 (two) times daily with a meal.   donepezil 5 MG tablet Commonly known as:  ARICEPT Take 5 mg by mouth at bedtime.   DULoxetine 60 MG capsule Commonly known as:  CYMBALTA Take 60 mg by mouth at bedtime.   folic acid 1 MG  tablet Commonly known as:  FOLVITE Take 1 mg by mouth at bedtime.   furosemide 40 MG tablet Commonly known as:  LASIX Take 1 tablet (40 mg total) by mouth every morning. What changed:    medication strength  how much to take   hydrOXYzine 25 MG tablet Commonly known as:  ATARAX/VISTARIL Take 25 mg by mouth at bedtime.   levothyroxine 88 MCG tablet Commonly known as:  SYNTHROID, LEVOTHROID Take 88 mcg by mouth daily before breakfast.   losartan 50 MG tablet Commonly known as:  COZAAR Take 1 tablet (50 mg total) by mouth at bedtime. What changed:    medication strength  how much to take   multivitamin with minerals tablet Take 1 tablet by mouth every morning.   oxybutynin 5 MG tablet Commonly known as:  DITROPAN Take 5 mg by mouth 2 (two) times daily.   potassium citrate 10 MEQ (1080 MG) SR tablet Commonly known as:  UROCIT-K Take 2 tablets (20 mEq total) by mouth every morning. What changed:  how much to take   rasagiline 0.5 MG Tabs tablet Commonly known as:  AZILECT Take 0.5 mg by mouth at bedtime.   simvastatin 10 MG tablet Commonly known as:  ZOCOR Take 10 mg by mouth at bedtime.      Allergies  Allergen Reactions  . Ciprofloxacin Other (See Comments)    Patient just knows that the was told that he was allergic   Follow-up Information    [facility]   Specialty:  Home Health Services Contact information: [address] [place] [address] [phone]        Ernest Squibb, MD. Schedule an appointment as soon as possible for a visit in 10 day(s).   Specialty:  Internal Medicine Contact information: [place] [address] [phone]        Arnoldo Lenis, MD Follow up.   Specialty:  Cardiology Why:  office will contact you with appointmnet details  Contact information: [address] [place] [address] [phone]           The results of significant  diagnostics from this hospitalization (including imaging, microbiology, ancillary and laboratory) are listed below for reference.    Significant Diagnostic Studies: Portable Chest 1 View  Result Date: 06/08/2018 CLINICAL DATA:  Shortness of breath EXAM: PORTABLE CHEST 1 VIEW COMPARISON:  Chest radiograph 03/04/2011 FINDINGS: There is marked cardiomegaly with central pulmonary vascular congestion and apical pulmonary venous diversion. No focal airspace consolidation. No pleural effusion or pneumothorax. IMPRESSION: Cardiomegaly and apical pulmonary venous diversion without overt pulmonary edema. Electronically Signed   By: Ulyses Jarred M.D.   On: 06/08/2018 21:52    Microbiology: Recent Results (from the past 240 hour(s))  Culture, blood (Routine X 2) w Reflex to ID Panel     Status: None (Preliminary result)   Collection Time: 06/09/18 11:56 AM  Result Value Ref Range Status   Specimen Description BLOOD RIGHT ARM  Final   Special Requests   Final    BOTTLES DRAWN AEROBIC AND ANAEROBIC  Blood Culture adequate volume   Culture   Final    NO GROWTH 3 DAYS Performed at [facility], [address], [place], [place] [address]    Report Status PENDING  Incomplete  Culture, blood (Routine X 2) w Reflex to ID Panel     Status: None (Preliminary result)   Collection Time: 06/09/18 11:56 AM  Result Value Ref Range Status   Specimen Description BLOOD LEFT ARM  Final   Special Requests   Final    BOTTLES DRAWN AEROBIC AND ANAEROBIC Blood Culture adequate volume   Culture   Final    NO GROWTH 3 DAYS Performed at [facility], [address], [place], [place] [address]    Report Status PENDING  Incomplete     Labs: Basic Metabolic Panel: Recent Labs  Lab 06/08/18 1840 06/09/18 0457 06/10/18 0428 06/11/18 0453 06/12/18 0722  NA 140 139 139 137 135  K 3.5 3.2* 4.2 4.3 4.4  CL 103 101 102 102 104  CO2 25 27 26 25 23   GLUCOSE 85 108* 87 84 93  BUN 30* 33* 43* 41* 29*  CREATININE 1.37*  1.44* 1.77* 1.47* 1.11  CALCIUM 9.5 9.5 9.2 9.2 9.4  MG 1.9  --   --   --   --   PHOS 4.7*  --   --   --   --    Liver Function Tests: Recent Labs  Lab 06/08/18 1840  AST 24  ALT 8  ALKPHOS 81  BILITOT 0.5  PROT 8.0  ALBUMIN 3.7   CBC: Recent Labs  Lab 06/08/18 1840 06/09/18 0457  WBC 5.2 5.0  NEUTROABS 3.0  --   HGB 10.4* 10.5*  HCT 31.8* 32.5*  MCV 96.4 95.6  PLT 187 188   Cardiac Enzymes: Recent Labs  Lab 06/08/18 1840 06/08/18 2121  TROPONINI 0.15* 0.14*   BNP: BNP (last 3 results) Recent Labs    06/08/18 1840  BNP 973.0*    Signed:  Barton Dubois MD.  [facility] 06/12/2018, 1:26 PM

## 2018-06-13 ENCOUNTER — Encounter (HOSPITAL_COMMUNITY): Payer: Self-pay | Admitting: Cardiology

## 2018-06-13 DIAGNOSIS — F0281 Dementia in other diseases classified elsewhere with behavioral disturbance: Secondary | ICD-10-CM | POA: Diagnosis not present

## 2018-06-13 DIAGNOSIS — K219 Gastro-esophageal reflux disease without esophagitis: Secondary | ICD-10-CM | POA: Diagnosis not present

## 2018-06-13 DIAGNOSIS — N183 Chronic kidney disease, stage 3 (moderate): Secondary | ICD-10-CM | POA: Diagnosis not present

## 2018-06-13 DIAGNOSIS — I130 Hypertensive heart and chronic kidney disease with heart failure and stage 1 through stage 4 chronic kidney disease, or unspecified chronic kidney disease: Secondary | ICD-10-CM | POA: Diagnosis not present

## 2018-06-13 DIAGNOSIS — I739 Peripheral vascular disease, unspecified: Secondary | ICD-10-CM | POA: Diagnosis not present

## 2018-06-13 DIAGNOSIS — E785 Hyperlipidemia, unspecified: Secondary | ICD-10-CM | POA: Diagnosis not present

## 2018-06-13 DIAGNOSIS — G20 Parkinson's disease: Secondary | ICD-10-CM | POA: Diagnosis not present

## 2018-06-13 DIAGNOSIS — I5043 Acute on chronic combined systolic (congestive) and diastolic (congestive) heart failure: Secondary | ICD-10-CM | POA: Diagnosis not present

## 2018-06-13 DIAGNOSIS — E039 Hypothyroidism, unspecified: Secondary | ICD-10-CM | POA: Diagnosis not present

## 2018-06-13 DIAGNOSIS — F419 Anxiety disorder, unspecified: Secondary | ICD-10-CM | POA: Diagnosis not present

## 2018-06-13 DIAGNOSIS — Z7982 Long term (current) use of aspirin: Secondary | ICD-10-CM | POA: Diagnosis not present

## 2018-06-13 DIAGNOSIS — Z87891 Personal history of nicotine dependence: Secondary | ICD-10-CM | POA: Diagnosis not present

## 2018-06-14 LAB — CULTURE, BLOOD (ROUTINE X 2)
Culture: NO GROWTH
Culture: NO GROWTH
SPECIAL REQUESTS: ADEQUATE
Special Requests: ADEQUATE

## 2018-06-15 ENCOUNTER — Other Ambulatory Visit: Payer: Self-pay

## 2018-06-15 NOTE — Patient Outreach (Signed)
[facility]) Care Management  06/15/2018  Ugonna Keirsey [birth year] [id number]   EMMI: general discharge red alert Referral date: 7/3/19Referral reason: Insurance: Health team advantage Day # 1  Telephone call to patient regarding EMMI general discharge red alert. HIPAA verified  By wife, Izyan Ezzell for patient.  Wife is listed on patients [facility] care management consent form.  Explained reason for wife. Wife states patient is doing fine.  Wife states patient is not having any problems with his medication.  States patient has his medication and is taking them as prescribed. Wife states patient does not have a follow up appointment scheduled with his primary MD as of yet. States they have spoken with primary MD office. Wife states patients brother will call and set up his appointment. Wife states patients brother also provides patient transportation to his appointments.  Wife reports patient has dementia.   Wife states [facility] will provided nursing, physical therapy and a home health aid to patient.  Wife denies patient having any additional needs / concerns at this time.  Wife states aware of symptoms and knows when to call patients doctor.  Aware to call 911 for severe symptoms. RNCM advised patient to notify MD of any changes in condition prior to scheduled appointment. RNCM provided contact name and number: [phone] or main office number [phone] and 24 hour nurse advise line [phone] by mail.  RNCM verified patient aware of 911 services for urgent/ emergent needs.  PLAN: RNCM will close patient due to refusal of services.  RNCm will send patients primary MD closure notification  RNCM will send patient [facility] care management brochure/ magnet.  Quinn Plowman RN,BSN,CCM [facility] Telephonic  [phone]

## 2018-06-17 ENCOUNTER — Other Ambulatory Visit: Payer: Self-pay

## 2018-06-17 NOTE — Patient Outreach (Signed)
[facility]) Care Management  06/17/2018  Amear Strojny 1941-09-14 [id number]  TELEPHONE SCREENING Referral date: 06/15/18 Referral source: primary MD  Referral reason: social worker assistance to discuss placement options.  Insurance: health team advantage. Attempt #1  Telephone call to patient's brother Kelsey Edman as requested by patients primary MD referral request. Contact answering phone states Idan Prime is not available. HIPAA compliant voice message left with call back phone number.   PLAN: RNCM will attempt 2nd telephone number within 4 business days. Outreach letter send.   Quinn Plowman RN,BSN,CCM [facility] Telephonic  [phone]

## 2018-06-21 DIAGNOSIS — G214 Vascular parkinsonism: Secondary | ICD-10-CM | POA: Diagnosis not present

## 2018-06-21 DIAGNOSIS — G894 Chronic pain syndrome: Secondary | ICD-10-CM | POA: Diagnosis not present

## 2018-06-21 DIAGNOSIS — R2689 Other abnormalities of gait and mobility: Secondary | ICD-10-CM | POA: Diagnosis not present

## 2018-06-21 DIAGNOSIS — F0151 Vascular dementia with behavioral disturbance: Secondary | ICD-10-CM | POA: Diagnosis not present

## 2018-06-24 ENCOUNTER — Other Ambulatory Visit: Payer: Self-pay

## 2018-06-24 NOTE — Patient Outreach (Addendum)
[facility]) Care Management  06/24/2018  [name] 12/24/1940 [id number]  TELEPHONE SCREENING Referral date: 06/15/18 Referral source: primary MD  Referral reason: social worker assistance to discuss placement options.  Insurance: health team advantage.   Telephone call to patient's brother/ designated party release, [name].  HIPAA verified by brother for patient. Explained reason for call. Brother states patients wife is not able to handle patient anymore.  Brother states patient has heart failure, parkinson's and dementia.  Brother states patient is not listening to anyone anymore. States he  Has become difficult to care for. Brother states patient's wife is his caregiver. He states patients wife is in a wheelchair and has several health issues. States wife is also taking care of a 37 year on son who has cerebral palsy/ handicapp.  Brother states patient's wife just cannot handle patient anymore.  Brother states patient was recently in the hospital. Brother states when patient was discharged from the hospital he was called a cab because he didn't have transportation home. Brother states he was dropped off at his home and waited 1 1/2 to 2 hour in the driveway.  Brother states he was called to come to the home to help get him inside. Brother states patient would not listen to go in the house so he had to call fire department to help get him inside.  Brother states patient is receiving home health physical therapy.  RNCM discussed and offered [facility] care management services. Brother verbally agreed to Education officer, museum assistance.   PLAN: RNCM will refer patient to Education officer, museum.   Quinn Plowman RN,BSN,CCM [facility] Telephonic  [phone]

## 2018-06-30 DIAGNOSIS — I5042 Chronic combined systolic (congestive) and diastolic (congestive) heart failure: Secondary | ICD-10-CM | POA: Diagnosis not present

## 2018-06-30 DIAGNOSIS — I6529 Occlusion and stenosis of unspecified carotid artery: Secondary | ICD-10-CM | POA: Diagnosis not present

## 2018-06-30 DIAGNOSIS — M109 Gout, unspecified: Secondary | ICD-10-CM | POA: Diagnosis not present

## 2018-06-30 DIAGNOSIS — Z710 Person encountering health services to consult on behalf of another person: Secondary | ICD-10-CM | POA: Diagnosis not present

## 2018-06-30 DIAGNOSIS — M5430 Sciatica, unspecified side: Secondary | ICD-10-CM | POA: Diagnosis not present

## 2018-06-30 DIAGNOSIS — Z6825 Body mass index (BMI) 25.0-25.9, adult: Secondary | ICD-10-CM | POA: Diagnosis not present

## 2018-06-30 DIAGNOSIS — E039 Hypothyroidism, unspecified: Secondary | ICD-10-CM | POA: Diagnosis not present

## 2018-06-30 DIAGNOSIS — F419 Anxiety disorder, unspecified: Secondary | ICD-10-CM | POA: Diagnosis not present

## 2018-06-30 DIAGNOSIS — F5101 Primary insomnia: Secondary | ICD-10-CM | POA: Diagnosis not present

## 2018-06-30 DIAGNOSIS — N183 Chronic kidney disease, stage 3 (moderate): Secondary | ICD-10-CM | POA: Diagnosis not present

## 2018-06-30 DIAGNOSIS — I10 Essential (primary) hypertension: Secondary | ICD-10-CM | POA: Diagnosis not present

## 2018-06-30 DIAGNOSIS — D500 Iron deficiency anemia secondary to blood loss (chronic): Secondary | ICD-10-CM | POA: Diagnosis not present

## 2018-06-30 DIAGNOSIS — M542 Cervicalgia: Secondary | ICD-10-CM | POA: Diagnosis not present

## 2018-06-30 DIAGNOSIS — E876 Hypokalemia: Secondary | ICD-10-CM | POA: Diagnosis not present

## 2018-06-30 DIAGNOSIS — E782 Mixed hyperlipidemia: Secondary | ICD-10-CM | POA: Diagnosis not present

## 2018-06-30 DIAGNOSIS — M479 Spondylosis, unspecified: Secondary | ICD-10-CM | POA: Diagnosis not present

## 2018-06-30 DIAGNOSIS — G20 Parkinson's disease: Secondary | ICD-10-CM | POA: Diagnosis not present

## 2018-06-30 DIAGNOSIS — F0390 Unspecified dementia without behavioral disturbance: Secondary | ICD-10-CM | POA: Diagnosis not present

## 2018-07-05 DIAGNOSIS — Z7982 Long term (current) use of aspirin: Secondary | ICD-10-CM | POA: Diagnosis not present

## 2018-07-05 DIAGNOSIS — N183 Chronic kidney disease, stage 3 (moderate): Secondary | ICD-10-CM | POA: Diagnosis not present

## 2018-07-05 DIAGNOSIS — I5043 Acute on chronic combined systolic (congestive) and diastolic (congestive) heart failure: Secondary | ICD-10-CM | POA: Diagnosis not present

## 2018-07-05 DIAGNOSIS — E039 Hypothyroidism, unspecified: Secondary | ICD-10-CM | POA: Diagnosis not present

## 2018-07-05 DIAGNOSIS — Z87891 Personal history of nicotine dependence: Secondary | ICD-10-CM | POA: Diagnosis not present

## 2018-07-05 DIAGNOSIS — F419 Anxiety disorder, unspecified: Secondary | ICD-10-CM | POA: Diagnosis not present

## 2018-07-05 DIAGNOSIS — I739 Peripheral vascular disease, unspecified: Secondary | ICD-10-CM | POA: Diagnosis not present

## 2018-07-05 DIAGNOSIS — F0281 Dementia in other diseases classified elsewhere with behavioral disturbance: Secondary | ICD-10-CM | POA: Diagnosis not present

## 2018-07-05 DIAGNOSIS — G20 Parkinson's disease: Secondary | ICD-10-CM | POA: Diagnosis not present

## 2018-07-05 DIAGNOSIS — I130 Hypertensive heart and chronic kidney disease with heart failure and stage 1 through stage 4 chronic kidney disease, or unspecified chronic kidney disease: Secondary | ICD-10-CM | POA: Diagnosis not present

## 2018-07-05 DIAGNOSIS — E785 Hyperlipidemia, unspecified: Secondary | ICD-10-CM | POA: Diagnosis not present

## 2018-07-05 DIAGNOSIS — K219 Gastro-esophageal reflux disease without esophagitis: Secondary | ICD-10-CM | POA: Diagnosis not present

## 2018-07-12 ENCOUNTER — Encounter: Payer: Self-pay | Admitting: Cardiology

## 2018-07-14 ENCOUNTER — Other Ambulatory Visit: Payer: Self-pay | Admitting: *Deleted

## 2018-07-14 NOTE — Patient Outreach (Signed)
[facility]) Care Management  07/14/2018  [name] [birth year] [id number]   CSW had received referral from [place] for assistance with placement options. Patient was referred by primary care provider, Dr. Nevada Crane. Patient currently resides with his wife who is wheelchair bound & has health issues and 76 year old handicapped son who has cerebral palsy. [name], Wilma Wuthrich assist with his care. CSW called & spoke with [name] to assess for placement options. [name] informed CSW that patient was being seen by [place] therapists but insurance stopped coverage & patient's wife is not interested in permanent placement for patient. CSW explained that for patient to be place in a nursing home for short-term/skilled nursing it would have to be recommended by physical therapist that patient would benefit from placement in order for patient's insurance ([employer]) to cover SNF stay, otherwise it would be private pay which can be around $200/day. [name] states that patient has appointments with Dr. Nevada Crane & Dr. Harl Bowie next Thursday with will plan to discuss with patient's wife & doctors next week to see what they are interested in. CSW will call [name] back next Friday to follow-up.    [name], LCSW [facility]  Clinical Social Worker cell #: [phone]

## 2018-07-21 ENCOUNTER — Ambulatory Visit: Payer: PPO | Admitting: Cardiology

## 2018-07-21 DIAGNOSIS — M109 Gout, unspecified: Secondary | ICD-10-CM | POA: Diagnosis not present

## 2018-07-21 DIAGNOSIS — Z6826 Body mass index (BMI) 26.0-26.9, adult: Secondary | ICD-10-CM | POA: Diagnosis not present

## 2018-07-22 ENCOUNTER — Other Ambulatory Visit: Payer: Self-pay | Admitting: *Deleted

## 2018-07-22 NOTE — Patient Outreach (Signed)
[name] Care Management  07/22/2018  [name] 1941/02/03 [id number]   CSW called & spoke with patient's brother, [name] to follow-up on caregiver resources for patient's wife. [name] informed CSW that he spoke with patient's wife who states that she does not want to have him placed in a facility but would be open to getting assistance in the home. CSW spoke with [name] about [facility] and he states that they would be open to that since they would just be picking him up & dropping him off to the adult day center which would give the wife relief during the day. CSW left voicemail for [name] at [facility] and will follow-up next week.    [name], LCSW [facility]  Clinical Social Worker cell #: [phone]

## 2018-07-27 ENCOUNTER — Other Ambulatory Visit: Payer: Self-pay | Admitting: *Deleted

## 2018-07-27 NOTE — Patient Outreach (Signed)
[facility]) Care Management  07/27/2018  Esco Joslyn 1941/07/17 [id number]   CSW after multiple attempts & missed calls, made contact with [name] at [facility] to make referral for patient. [name] states that she plans to call patient's brother this afternoon to setup evaluation meeting but that patient sounds appropriate for [facility]. CSW will follow-up with [name] & patient's brother, [name] in 1 week.    [name], LCSW [facility]  Clinical Social Worker cell #: [phone]

## 2018-08-01 ENCOUNTER — Encounter: Payer: Self-pay | Admitting: Cardiology

## 2018-08-01 ENCOUNTER — Ambulatory Visit: Payer: PPO | Admitting: Cardiology

## 2018-08-01 VITALS — BP 178/98 | HR 73 | Ht 68.5 in | Wt 184.0 lb

## 2018-08-01 DIAGNOSIS — I10 Essential (primary) hypertension: Secondary | ICD-10-CM

## 2018-08-01 DIAGNOSIS — Z79899 Other long term (current) drug therapy: Secondary | ICD-10-CM

## 2018-08-01 DIAGNOSIS — I5023 Acute on chronic systolic (congestive) heart failure: Secondary | ICD-10-CM | POA: Diagnosis not present

## 2018-08-01 MED ORDER — FUROSEMIDE 40 MG PO TABS: 60.0000 mg | ORAL_TABLET | Freq: Every morning | ORAL | 1 refills | Status: DC

## 2018-08-01 MED ORDER — CARVEDILOL 12.5 MG PO TABS: 12.5000 mg | ORAL_TABLET | Freq: Two times a day (BID) | ORAL | 3 refills | Status: DC

## 2018-08-01 NOTE — Progress Notes (Signed)
Clinical Summary [name] is a 77 y.o.male seen today for follow up of the following medical problems.    1. Chronic systolic HF - admission 0/8676 with volume overload.  - 05/2018 Echo LVEF 40%, grade II diastolic dyscuntion, apical hypokinesis,  density attached to MV subvalvular apparatus - ischemic testing not pursued due to AKI at the time and dementia  - discharge weight 177 lbs per charting.  - has had some SOB/DOE. No recent edema. Some orthopnea - compliant with meds. Wife helps with meds with pill box.   2. 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 Anxiety   . GERD (gastroesophageal reflux disease)   . Hyperlipidemia   . Hypertension   . Myocardial infarction ([place])   . Neuromuscular disorder ([place])   . Parkinson's disease ([place])   . Peripheral vascular disease (HCC)      Allergies  Allergen Reactions  . Ciprofloxacin Other (See Comments)    Patient just knows that the was told that he was allergic     Current Outpatient Medications  Medication Sig Dispense Refill  . acetaminophen (TYLENOL) 650 MG CR tablet Take 650-1,300 mg by mouth daily as needed for pain.     Marland Kitchen aspirin 325 MG EC tablet Take 325 mg by mouth every morning.     . carbidopa-levodopa (SINEMET) 25-100 MG per tablet Take 1 tablet by mouth 2 (two) times daily.     . carvedilol (COREG) 6.25 MG tablet Take 1 tablet (6.25 mg total) by mouth 2 (two) times daily with a meal. 60 tablet 1  . donepezil (ARICEPT) 5 MG tablet Take 5 mg by mouth at bedtime.    . DULoxetine (CYMBALTA) 60 MG capsule Take 60 mg by mouth at bedtime.     . folic acid (FOLVITE) 1 MG tablet Take 1 mg by mouth at bedtime.     . furosemide (LASIX) 40 MG tablet Take 1 tablet (40 mg total) by mouth every morning. 30 tablet 1  . hydrOXYzine (ATARAX/VISTARIL) 25 MG tablet Take 25 mg by mouth at bedtime.    Marland Kitchen levothyroxine (SYNTHROID, LEVOTHROID) 88 MCG tablet Take 88 mcg by mouth  daily before breakfast.    . losartan (COZAAR) 50 MG tablet Take 1 tablet (50 mg total) by mouth at bedtime. 30 tablet 1  . Multiple Vitamins-Minerals (MULTIVITAMIN WITH MINERALS) tablet Take 1 tablet by mouth every morning.     Marland Kitchen oxybutynin (DITROPAN) 5 MG tablet Take 5 mg by mouth 2 (two) times daily.    . potassium citrate (UROCIT-K) 10 MEQ (1080 MG) SR tablet Take 2 tablets (20 mEq total) by mouth every morning. 60 tablet 0  . rasagiline (AZILECT) 0.5 MG TABS Take 0.5 mg by mouth at bedtime.     . simvastatin (ZOCOR) 10 MG tablet Take 10 mg by mouth at bedtime.       No current facility-administered medications for this visit.      Past Surgical History:  Procedure Laterality Date  . BLADDER REPAIR     pt sts "when I had my prostatectomy they cut too much and I have an internal button I have to press in order to release my urine"  . BLADDER SURGERY     2010  pump placed   . CATARACT EXTRACTION W/PHACO  07/28/2011   Procedure: CATARACT EXTRACTION PHACO AND INTRAOCULAR LENS PLACEMENT (IOC);  Surgeon: Elta Guadeloupe T. Gershon Crane;  Location: [facility];  Service: Ophthalmology;  Laterality: Right;  CDE: 20.26  . CATARACT EXTRACTION W/PHACO  09/08/2011   Procedure: CATARACT EXTRACTION PHACO AND INTRAOCULAR LENS PLACEMENT (IOC);  Surgeon: Elta Guadeloupe T. Gershon Crane;  Location: [facility];  Service: Ophthalmology;  Laterality: Left;  CDE:37.31  . ENDARTERECTOMY Right 10/07/2016   Procedure: RIGHT ENDARTERECTOMY CAROTID WITH LIGATION OF INTERNAL CAROTID;  Surgeon: Serafina Mitchell, MD;  Location: [place];  Service: Vascular;  Laterality: Right;  . PATCH ANGIOPLASTY Right 10/07/2016   Procedure: [place];  Surgeon: Serafina Mitchell, MD;  Location: [place];  Service: Vascular;  Laterality: Right;  . PROSTATECTOMY    . TEE WITHOUT CARDIOVERSION N/A 06/10/2018   Procedure: TRANSESOPHAGEAL ECHOCARDIOGRAM (TEE);  Surgeon: Arnoldo Lenis, MD;  [facility];  Service: Endoscopy;   Laterality: N/A;  . YAG LASER APPLICATION  76/22/6333   Procedure: YAG LASER APPLICATION;  Surgeon: Elta Guadeloupe T. Gershon Crane, MD;  [facility]: [facility];  Service: Ophthalmology;  Laterality: Right;     Allergies  Allergen Reactions  . Ciprofloxacin Other (See Comments)    Patient just knows that the was told that he was allergic      Family History  Problem Relation Age of Onset  . Cancer Mother   . Cancer Father   . Anesthesia problems Neg Hx   . Hypotension Neg Hx   . Malignant hyperthermia Neg Hx   . Pseudochol deficiency Neg Hx      Social History [name] reports that he quit smoking about 35 years ago. His smoking use included cigarettes. He has a 5.00 pack-year smoking history. He has never used smokeless tobacco. [name] reports that he does not drink alcohol.   Review of Systems CONSTITUTIONAL: No weight loss, fever, chills, weakness or fatigue.  HEENT: Eyes: No visual loss, blurred vision, double vision or yellow sclerae.No hearing loss, sneezing, congestion, runny nose or sore throat.  SKIN: No rash or itching.  CARDIOVASCULAR: per hpi RESPIRATORY:per hpi GASTROINTESTINAL: No anorexia, nausea, vomiting or diarrhea. No abdominal pain or blood.  GENITOURINARY: No burning on urination, no polyuria NEUROLOGICAL: No headache, dizziness, syncope, paralysis, ataxia, numbness or tingling in the extremities. No change in bowel or bladder control.  MUSCULOSKELETAL: No muscle, back pain, joint pain or stiffness.  LYMPHATICS: No enlarged nodes. No history of splenectomy.  PSYCHIATRIC: No history of depression or anxiety.  ENDOCRINOLOGIC: No reports of sweating, cold or heat intolerance. No polyuria or polydipsia.  Marland Kitchen   Physical Examination Vitals:   08/01/18 1055  BP: (!) 178/98  Pulse: 73  SpO2: 98%   Filed Weights   08/01/18 1055  Weight: 184 lb (83.5 kg)    Gen: resting comfortably, no acute distress HEENT: no scleral icterus, pupils equal round and reactive, no  palptable cervical adenopathy,  CV: RRR, no m/r/g, no jvd Resp: Clear to auscultation bilaterally GI: abdomen is soft, non-tender, non-distended, normal bowel sounds, no hepatosplenomegaly MSK: extremities are warm, no edema.  Skin: warm, no rash Neuro:  no focal deficits Psych: appropriate affect    Assessment and Plan   1. Acute on chronic systolic HF - ongoing symptoms, we will increase his lasix to 60mg  daily - increase coreg to 12.5mg  bid - we discussed a cath today. Difficult to assess his mental capacity, he has history of Parkinsons and dementia. In our discussions he is constantly joking or defering to his brother. From what I can tell I think he would be a reasonable candidate for cath. He  is to give some though and discuss with his brother, who has had the procedure previously - repeat BMET/Mg in 2 weeks - lower ASA to 81mg  daily.   2. HTN - above goal, increase coreg to 12.5mg  bid.   F/u 1 month       Arnoldo Lenis, M.D.

## 2018-08-01 NOTE — Patient Instructions (Signed)
Medication Instructions:  INCREASE LASIX TO 60 MG DAILY INCREASE COREG 12.5 TWO TIMES DAILY   Labwork: 2 WEEKS  BMET MAGNESIUM   Testing/Procedures: NONE  Follow-Up: Your physician recommends that you schedule a follow-up appointment in: 1 MONTH    Any Other Special Instructions Will Be Listed Below (If Applicable).     If you need a refill on your cardiac medications before your next appointment, please call your pharmacy.

## 2018-08-04 ENCOUNTER — Other Ambulatory Visit: Payer: Self-pay | Admitting: *Deleted

## 2018-08-05 NOTE — Patient Outreach (Signed)
[facility] [facility] [facility]) Care Management  08/05/2018  [name] 05-Dec-1941 [id number]   CSW attempted to reach patient's brother, [name] to follow-up on [facility] referral but no answer. CSW left HIPPA compliant voicemail on [phone]. CSW also left voicemail for [name] with [facility]. CSW will await returned call from patient's brother.    [name], LCSW [facility]  Clinical Social Worker cell #: [phone]

## 2018-08-08 ENCOUNTER — Other Ambulatory Visit: Payer: Self-pay | Admitting: *Deleted

## 2018-08-08 NOTE — Patient Outreach (Signed)
[place] [facility]) Care Management  08/08/2018  [name] 15-Jun-1941 [id number]   CSW called & spoke with patient's brother, [name] who states that he had spoken with [name] at [facility] and is waiting for the information in the mail regarding the program but that he spoke with patient's wife, [name] about it and she seemed agreeable. Patient's wife to complete medicaid application with [name] within the next few weeks. Patient's wife is still insistant that she does not want him placed in a nursing home - that she would prefer to keep him home home as long as possible but would be agreeable with him going to [facility] for some resp[facility] during the day. CSW will follow-up with patient's brother in 3 weeks.    [name], LCSW [facility]  Clinical Social Worker cell #: [phone]

## 2018-08-10 DIAGNOSIS — F0281 Dementia in other diseases classified elsewhere with behavioral disturbance: Secondary | ICD-10-CM | POA: Diagnosis not present

## 2018-08-10 DIAGNOSIS — Z710 Person encountering health services to consult on behalf of another person: Secondary | ICD-10-CM | POA: Diagnosis not present

## 2018-08-10 DIAGNOSIS — F918 Other conduct disorders: Secondary | ICD-10-CM | POA: Diagnosis not present

## 2018-08-18 ENCOUNTER — Other Ambulatory Visit (HOSPITAL_COMMUNITY)
Admission: RE | Admit: 2018-08-18 | Discharge: 2018-08-18 | Disposition: A | Discharge status: Home / Self Care | Payer: PPO | Source: Ambulatory Visit | Attending: Cardiology | Admitting: Cardiology

## 2018-08-18 DIAGNOSIS — Z79899 Other long term (current) drug therapy: Secondary | ICD-10-CM | POA: Diagnosis not present

## 2018-08-18 LAB — BASIC METABOLIC PANEL
ANION GAP: 8 (ref 5–15)
BUN: 41 mg/dL — ABNORMAL HIGH (ref 8–23)
CHLORIDE: 101 mmol/L (ref 98–111)
CO2: 29 mmol/L (ref 22–32)
CREATININE: 1.51 mg/dL — AB (ref 0.61–1.24)
Calcium: 9.1 mg/dL (ref 8.9–10.3)
GFR calc non Af Amer: 43 mL/min — ABNORMAL LOW (ref >60)
GFR, EST AFRICAN AMERICAN: 50 mL/min — AB (ref >60)
Glucose, Bld: 106 mg/dL — ABNORMAL HIGH (ref 70–99)
POTASSIUM: 4.5 mmol/L (ref 3.5–5.1)
SODIUM: 138 mmol/L (ref 135–145)

## 2018-08-18 LAB — MAGNESIUM: MAGNESIUM: 2.2 mg/dL (ref 1.7–2.4)

## 2018-08-19 ENCOUNTER — Telehealth: Payer: Self-pay

## 2018-08-19 NOTE — Telephone Encounter (Signed)
Pt notified. Voiced understanding.

## 2018-08-19 NOTE — Telephone Encounter (Signed)
-----   Message from Arnoldo Lenis, MD sent at 08/19/2018 12:21 PM EDT ----- Labs show some decrease in his kidney function, please change lasix to 60mg  alternating days with 40mg    Zandra Abts MD

## 2018-08-26 DIAGNOSIS — I5042 Chronic combined systolic (congestive) and diastolic (congestive) heart failure: Secondary | ICD-10-CM | POA: Diagnosis not present

## 2018-08-26 DIAGNOSIS — F0281 Dementia in other diseases classified elsewhere with behavioral disturbance: Secondary | ICD-10-CM | POA: Diagnosis not present

## 2018-08-26 DIAGNOSIS — R600 Localized edema: Secondary | ICD-10-CM | POA: Diagnosis not present

## 2018-08-26 DIAGNOSIS — Z710 Person encountering health services to consult on behalf of another person: Secondary | ICD-10-CM | POA: Diagnosis not present

## 2018-08-26 DIAGNOSIS — Z9181 History of falling: Secondary | ICD-10-CM | POA: Diagnosis not present

## 2018-08-29 ENCOUNTER — Other Ambulatory Visit: Payer: Self-pay | Admitting: *Deleted

## 2018-09-01 NOTE — Patient Outreach (Signed)
[place] [facility]) Care Management  09/01/2018  Tyton Abdallah [birth year] [id number]   CSW attempted to reach patient's wife, [name] at patient's home #: [phone] to follow-up on social needs and services/placement, without success. Phone just kept ringing & CSW was unable to leave a voicemail. CSW will make second attempt to reach patient in 4 days if no returned call in the meantime.    [name], LCSW [facility]  Clinical Social Worker cell #: [phone]

## 2018-09-07 ENCOUNTER — Ambulatory Visit: Payer: PPO | Admitting: Cardiology

## 2018-09-07 ENCOUNTER — Ambulatory Visit: Payer: Self-pay | Admitting: *Deleted

## 2018-09-12 DIAGNOSIS — R5381 Other malaise: Secondary | ICD-10-CM | POA: Diagnosis not present

## 2018-09-15 DIAGNOSIS — Z9181 History of falling: Secondary | ICD-10-CM | POA: Diagnosis not present

## 2018-09-15 DIAGNOSIS — I5043 Acute on chronic combined systolic (congestive) and diastolic (congestive) heart failure: Secondary | ICD-10-CM | POA: Diagnosis not present

## 2018-09-19 ENCOUNTER — Encounter: Payer: Self-pay | Admitting: Cardiology

## 2018-09-19 ENCOUNTER — Ambulatory Visit: Payer: PPO | Admitting: Cardiology

## 2018-09-19 ENCOUNTER — Telehealth: Payer: Self-pay | Admitting: Cardiology

## 2018-09-19 VITALS — BP 175/90 | HR 62 | Ht 68.5 in | Wt 184.8 lb

## 2018-09-19 DIAGNOSIS — I10 Essential (primary) hypertension: Secondary | ICD-10-CM

## 2018-09-19 DIAGNOSIS — Z23 Encounter for immunization: Secondary | ICD-10-CM

## 2018-09-19 DIAGNOSIS — I5022 Chronic systolic (congestive) heart failure: Secondary | ICD-10-CM

## 2018-09-19 MED ORDER — FUROSEMIDE 40 MG PO TABS: 40.0000 mg | ORAL_TABLET | Freq: Every day | ORAL | Status: DC

## 2018-09-19 MED ORDER — SPIRONOLACTONE 25 MG PO TABS: 12.5000 mg | ORAL_TABLET | Freq: Every day | ORAL | 6 refills | Status: DC

## 2018-09-19 NOTE — Addendum Note (Signed)
Addended by: Laurine Blazer on: 09/19/2018 10:14 AM   Modules accepted: Orders

## 2018-09-19 NOTE — Progress Notes (Signed)
Clinical Summary Mr. Buss is a 77 y.o.male seen today for follow up of the following medical problems.    1. Chronic systolic HF - admission 05/2702 with volume overload.  - 05/2018 Echo LVEF 40%, grade II diastolic dyscuntion, apical hypokinesis,  density attached to MV subvalvular apparatus - ischemic testing not pursued due to AKI at the time and dementia - we discussed possible cath last visit but patient wanted to think over.    - last visit we increased his lasix to 60mg  daily and increased coreg to 12.5mg  bid. Cr incrased from 1.11 to 1.5, we changed lasix to 60mg  alternating with 40mg  -no recent SOB. No recent edema - compliant with meds.    2. 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 Anxiety   . GERD (gastroesophageal reflux disease)   . Hyperlipidemia   . Hypertension   . Myocardial infarction ([place])   . Neuromuscular disorder ([place])   . Parkinson's disease ([place])   . Peripheral vascular disease (HCC)      Allergies  Allergen Reactions  . Ciprofloxacin Other (See Comments)    Patient just knows that the was told that he was allergic     Current Outpatient Medications  Medication Sig Dispense Refill  . acetaminophen (TYLENOL) 650 MG CR tablet Take 650-1,300 mg by mouth daily as needed for pain.     Marland Kitchen aspirin EC 81 MG tablet Take 81 mg by mouth daily.    . carbidopa-levodopa (SINEMET) 25-100 MG per tablet Take 1 tablet by mouth 2 (two) times daily.     . carvedilol (COREG) 12.5 MG tablet Take 1 tablet (12.5 mg total) by mouth 2 (two) times daily with a meal. 180 tablet 3  . donepezil (ARICEPT) 5 MG tablet Take 5 mg by mouth at bedtime.    . DULoxetine (CYMBALTA) 60 MG capsule Take 60 mg by mouth at bedtime.     . folic acid (FOLVITE) 1 MG tablet Take 1 mg by mouth at bedtime.     . furosemide (LASIX) 40 MG tablet Take 1.5 tablets (60 mg total) by mouth every morning. 135 tablet 1  . hydrOXYzine  (ATARAX/VISTARIL) 25 MG tablet Take 25 mg by mouth at bedtime.    Marland Kitchen levothyroxine (SYNTHROID, LEVOTHROID) 88 MCG tablet Take 88 mcg by mouth daily before breakfast.    . losartan (COZAAR) 50 MG tablet Take 1 tablet (50 mg total) by mouth at bedtime. 30 tablet 1  . Multiple Vitamins-Minerals (MULTIVITAMIN WITH MINERALS) tablet Take 1 tablet by mouth every morning.     Marland Kitchen oxybutynin (DITROPAN) 5 MG tablet Take 5 mg by mouth 2 (two) times daily.    . potassium citrate (UROCIT-K) 10 MEQ (1080 MG) SR tablet Take 2 tablets (20 mEq total) by mouth every morning. 60 tablet 0  . rasagiline (AZILECT) 0.5 MG TABS Take 0.5 mg by mouth at bedtime.     . simvastatin (ZOCOR) 10 MG tablet Take 10 mg by mouth at bedtime.       No current facility-administered medications for this visit.      Past Surgical History:  Procedure Laterality Date  . BLADDER REPAIR     pt sts "when I had my prostatectomy they cut too much and I have an internal button I have to press in order to release my urine"  . BLADDER SURGERY     2010  pump placed   . CATARACT  EXTRACTION W/PHACO  07/28/2011   Procedure: CATARACT EXTRACTION PHACO AND INTRAOCULAR LENS PLACEMENT (IOC);  Surgeon: Elta Guadeloupe T. Gershon Crane;  [facility];  Service: Ophthalmology;  Laterality: Right;  CDE: 20.26  . CATARACT EXTRACTION W/PHACO  09/08/2011   Procedure: CATARACT EXTRACTION PHACO AND INTRAOCULAR LENS PLACEMENT (IOC);  Surgeon: Elta Guadeloupe T. Gershon Crane;  [facility];  Service: Ophthalmology;  Laterality: Left;  CDE:37.31  . ENDARTERECTOMY Right 10/07/2016   Procedure: RIGHT ENDARTERECTOMY CAROTID WITH LIGATION OF INTERNAL CAROTID;  Surgeon: Serafina Mitchell, MD;  Location: [place];  Service: Vascular;  Laterality: Right;  . PATCH ANGIOPLASTY Right 10/07/2016   Procedure: [place];  Surgeon: Serafina Mitchell, MD;  Location: [place];  Service: Vascular;  Laterality: Right;  . PROSTATECTOMY    . TEE WITHOUT CARDIOVERSION N/A  06/10/2018   Procedure: TRANSESOPHAGEAL ECHOCARDIOGRAM (TEE);  Surgeon: Arnoldo Lenis, MD;  [facility];  Service: Endoscopy;  Laterality: N/A;  . YAG LASER APPLICATION  62/83/1517   Procedure: YAG LASER APPLICATION;  Surgeon: Elta Guadeloupe T. Gershon Crane, MD;  [facility];  Service: Ophthalmology;  Laterality: Right;     Allergies  Allergen Reactions  . Ciprofloxacin Other (See Comments)    Patient just knows that the was told that he was allergic      Family History  Problem Relation Age of Onset  . Cancer Mother   . Cancer Father   . Anesthesia problems Neg Hx   . Hypotension Neg Hx   . Malignant hyperthermia Neg Hx   . Pseudochol deficiency Neg Hx      Social History Mr. Meinhardt reports that he quit smoking about 35 years ago. His smoking use included cigarettes. He has a 5.00 pack-year smoking history. He has never used smokeless tobacco. Mr. Plemons reports that he does not drink alcohol.   Review of Systems CONSTITUTIONAL: No weight loss, fever, chills, weakness or fatigue.  HEENT: Eyes: No visual loss, blurred vision, double vision or yellow sclerae.No hearing loss, sneezing, congestion, runny nose or sore throat.  SKIN: No rash or itching.  CARDIOVASCULAR: per hpi RESPIRATORY: per hpi GASTROINTESTINAL: No anorexia, nausea, vomiting or diarrhea. No abdominal pain or blood.  GENITOURINARY: No burning on urination, no polyuria NEUROLOGICAL: No headache, dizziness, syncope, paralysis, ataxia, numbness or tingling in the extremities. No change in bowel or bladder control.  MUSCULOSKELETAL: No muscle, back pain, joint pain or stiffness.  LYMPHATICS: No enlarged nodes. No history of splenectomy.  PSYCHIATRIC: No history of depression or anxiety.  ENDOCRINOLOGIC: No reports of sweating, cold or heat intolerance. No polyuria or polydipsia.  Marland Kitchen   Physical Examination Vitals:   09/19/18 0916  BP: (!) 175/90  Pulse: 62  SpO2: 100%   Vitals:   09/19/18 0916    Weight: 184 lb 12.8 oz (83.8 kg)  Height: 5' 8.5" (1.74 m)    Gen: resting comfortably, no acute distress HEENT: no scleral icterus, pupils equal round and reactive, no palptable cervical adenopathy,  CV: RRR, 2/6 systolic murmur rusb, no jvd Resp: Clear to auscultation bilaterally GI: abdomen is soft, non-tender, non-distended, normal bowel sounds, no hepatosplenomegaly MSK: extremities are warm, no edema.  Skin: warm, no rash Neuro:  no focal deficits Psych: appropriate affect     Assessment and Plan   1. Chronic systolic HF - no symptoms, appears euvolemic.  - we will repeat echo, if ongoing dysfunction would plan for LHC/RHC, patient is willing to proceed at this time if indicated.  -  start aldactone 12.5mg  daily in setting of systolic dysfunction and HTN.  - repeat BMET/Mg in 1 week, add cbc since likely plans for cath.  - lower lasix to 40mg  daily   2. HTN -above goal. Low normal heart rates, would not increase coreg - start aldactone 12.5mg  daily.    F/u 2 months. Would plan for cath pending upcoming echo.      Arnoldo Lenis, M.D.

## 2018-09-19 NOTE — Patient Instructions (Signed)
Medication Instructions:   Begin Aldactone 12.5mg  daily.   Decrease Lasix to 40mg  daily.   Continue all other medications.    Labwork:  BMET, Magnesium, CBC - orders given today.   Please do in 1 week.  Testing/Procedures: Your physician has requested that you have an echocardiogram. Echocardiography is a painless test that uses sound waves to create images of your heart. It provides your doctor with information about the size and shape of your heart and how well your heart's chambers and valves are working. This procedure takes approximately one hour. There are no restrictions for this procedure.  Follow-Up:  Office will contact with results via phone or letter.    2 months   Any Other Special Instructions Will Be Listed Below (If Applicable).  If you need a refill on your cardiac medications before your next appointment, please call your pharmacy.

## 2018-09-19 NOTE — Telephone Encounter (Signed)
°  Precert needed for: Echo - chronic systolic heart failure  Location: [facility]     Date: Oct 12, 2018

## 2018-09-26 DIAGNOSIS — R2689 Other abnormalities of gait and mobility: Secondary | ICD-10-CM | POA: Diagnosis not present

## 2018-09-26 DIAGNOSIS — F0151 Vascular dementia with behavioral disturbance: Secondary | ICD-10-CM | POA: Diagnosis not present

## 2018-09-26 DIAGNOSIS — G214 Vascular parkinsonism: Secondary | ICD-10-CM | POA: Diagnosis not present

## 2018-09-26 DIAGNOSIS — G894 Chronic pain syndrome: Secondary | ICD-10-CM | POA: Diagnosis not present

## 2018-09-28 ENCOUNTER — Other Ambulatory Visit (HOSPITAL_COMMUNITY)
Admission: RE | Admit: 2018-09-28 | Discharge: 2018-09-28 | Disposition: A | Discharge status: Home / Self Care | Payer: PPO | Source: Ambulatory Visit | Attending: Cardiology | Admitting: Cardiology

## 2018-09-28 DIAGNOSIS — I5022 Chronic systolic (congestive) heart failure: Secondary | ICD-10-CM | POA: Diagnosis not present

## 2018-09-28 DIAGNOSIS — I10 Essential (primary) hypertension: Secondary | ICD-10-CM | POA: Insufficient documentation

## 2018-09-28 LAB — CBC
HEMATOCRIT: 32 % — AB (ref 39.0–52.0)
HEMOGLOBIN: 10.3 g/dL — AB (ref 13.0–17.0)
MCH: 31.3 pg (ref 26.0–34.0)
MCHC: 32.2 g/dL (ref 30.0–36.0)
MCV: 97.3 fL (ref 80.0–100.0)
NRBC: 0 % (ref 0.0–0.2)
Platelets: 157 10*3/uL (ref 150–400)
RBC: 3.29 MIL/uL — ABNORMAL LOW (ref 4.22–5.81)
RDW: 16.3 % — ABNORMAL HIGH (ref 11.5–15.5)
WBC: 5.5 10*3/uL (ref 4.0–10.5)

## 2018-09-28 LAB — BASIC METABOLIC PANEL
ANION GAP: 8 (ref 5–15)
BUN: 43 mg/dL — ABNORMAL HIGH (ref 8–23)
CHLORIDE: 101 mmol/L (ref 98–111)
CO2: 26 mmol/L (ref 22–32)
Calcium: 9.6 mg/dL (ref 8.9–10.3)
Creatinine, Ser: 2.06 mg/dL — ABNORMAL HIGH (ref 0.61–1.24)
GFR calc Af Amer: 35 mL/min — ABNORMAL LOW (ref >60)
GFR calc non Af Amer: 30 mL/min — ABNORMAL LOW (ref >60)
GLUCOSE: 106 mg/dL — AB (ref 70–99)
POTASSIUM: 5.8 mmol/L — AB (ref 3.5–5.1)
Sodium: 135 mmol/L (ref 135–145)

## 2018-09-28 LAB — MAGNESIUM: Magnesium: 2.7 mg/dL — ABNORMAL HIGH (ref 1.7–2.4)

## 2018-09-30 ENCOUNTER — Telehealth: Payer: Self-pay | Admitting: *Deleted

## 2018-09-30 ENCOUNTER — Other Ambulatory Visit: Payer: Self-pay | Admitting: *Deleted

## 2018-09-30 DIAGNOSIS — E875 Hyperkalemia: Secondary | ICD-10-CM

## 2018-09-30 DIAGNOSIS — I5043 Acute on chronic combined systolic (congestive) and diastolic (congestive) heart failure: Secondary | ICD-10-CM

## 2018-09-30 DIAGNOSIS — R0602 Shortness of breath: Secondary | ICD-10-CM

## 2018-09-30 NOTE — Telephone Encounter (Signed)
-----   Message from Arnoldo Lenis, MD sent at 09/30/2018  3:10 PM EDT ----- Labs show worsening kidney function and also high potassium levels. He needs to stop his lasix, aldactone, and losartan for the time being. Needs repeat BMET/Mg on Wednesday.    Zandra Abts MD

## 2018-09-30 NOTE — Telephone Encounter (Signed)
Wife informed and verbalized understanding of plan.  Lab order placed and faxed to [facility] lab.  Please disregard message in this encounter dated for 08/19/2018.

## 2018-09-30 NOTE — Telephone Encounter (Signed)
-----   Message from Arnoldo Lenis, MD sent at 09/30/2018  3:24 PM EDT -----   ----- Message ----- From: Arnoldo Lenis, MD Sent: 08/19/2018  12:21 PM EDT To: Drema Dallas, CMA  Labs show some decrease in his kidney function, please change lasix to 60mg  alternating days with 40mg    Zandra Abts MD

## 2018-10-05 ENCOUNTER — Telehealth: Payer: Self-pay | Admitting: *Deleted

## 2018-10-05 ENCOUNTER — Other Ambulatory Visit (HOSPITAL_COMMUNITY)
Admission: RE | Admit: 2018-10-05 | Discharge: 2018-10-05 | Disposition: A | Discharge status: Home / Self Care | Payer: PPO | Source: Ambulatory Visit | Attending: Cardiology | Admitting: Cardiology

## 2018-10-05 DIAGNOSIS — E875 Hyperkalemia: Secondary | ICD-10-CM | POA: Insufficient documentation

## 2018-10-05 DIAGNOSIS — R0602 Shortness of breath: Secondary | ICD-10-CM | POA: Insufficient documentation

## 2018-10-05 DIAGNOSIS — I5043 Acute on chronic combined systolic (congestive) and diastolic (congestive) heart failure: Secondary | ICD-10-CM | POA: Insufficient documentation

## 2018-10-05 LAB — BASIC METABOLIC PANEL
Anion gap: 6 (ref 5–15)
BUN: 41 mg/dL — ABNORMAL HIGH (ref 8–23)
CO2: 23 mmol/L (ref 22–32)
CREATININE: 1.87 mg/dL — AB (ref 0.61–1.24)
Calcium: 9.8 mg/dL (ref 8.9–10.3)
Chloride: 103 mmol/L (ref 98–111)
GFR calc Af Amer: 39 mL/min — ABNORMAL LOW (ref >60)
GFR, EST NON AFRICAN AMERICAN: 34 mL/min — AB (ref >60)
Glucose, Bld: 114 mg/dL — ABNORMAL HIGH (ref 70–99)
POTASSIUM: 6.3 mmol/L — AB (ref 3.5–5.1)
SODIUM: 132 mmol/L — AB (ref 135–145)

## 2018-10-05 LAB — MAGNESIUM: MAGNESIUM: 2.5 mg/dL — AB (ref 1.7–2.4)

## 2018-10-05 MED ORDER — SODIUM POLYSTYRENE SULFONATE PO POWD: ORAL | 0 refills | Status: DC

## 2018-10-05 NOTE — Telephone Encounter (Signed)
Lab from [facility] called with pt potassium 6.4 - will forward to covering provider

## 2018-10-05 NOTE — Telephone Encounter (Signed)
Outpatient medication list would indicate he is still taking supplemental potassium.  This needs to be discontinued ASAP.  Start Kayexalate 15 g twice daily for 3 doses (3rd dose tomorrow morning) and repeat basic metabolic panel tomorrow.

## 2018-10-05 NOTE — Telephone Encounter (Signed)
Pt wife (DPR) voiced understanding of instructions - kayexalate sent to [facility] as requested - pt will have labs repeated tomorrow @ [facility]

## 2018-10-06 ENCOUNTER — Other Ambulatory Visit (HOSPITAL_COMMUNITY)
Admission: RE | Admit: 2018-10-06 | Discharge: 2018-10-06 | Disposition: A | Discharge status: Home / Self Care | Payer: PPO | Source: Ambulatory Visit | Attending: Cardiovascular Disease | Admitting: Cardiovascular Disease

## 2018-10-06 ENCOUNTER — Telehealth: Payer: Self-pay | Admitting: *Deleted

## 2018-10-06 DIAGNOSIS — E875 Hyperkalemia: Secondary | ICD-10-CM

## 2018-10-06 LAB — BASIC METABOLIC PANEL
Anion gap: 9 (ref 5–15)
BUN: 44 mg/dL — ABNORMAL HIGH (ref 8–23)
CHLORIDE: 103 mmol/L (ref 98–111)
CO2: 23 mmol/L (ref 22–32)
CREATININE: 1.92 mg/dL — AB (ref 0.61–1.24)
Calcium: 9.6 mg/dL (ref 8.9–10.3)
GFR calc non Af Amer: 32 mL/min — ABNORMAL LOW (ref >60)
GFR, EST AFRICAN AMERICAN: 38 mL/min — AB (ref >60)
Glucose, Bld: 99 mg/dL (ref 70–99)
POTASSIUM: 5.9 mmol/L — AB (ref 3.5–5.1)
SODIUM: 135 mmol/L (ref 135–145)

## 2018-10-06 NOTE — Telephone Encounter (Signed)
-----   Message from Massie Maroon, [place] sent at 10/06/2018 12:28 PM EDT -----   ----- Message ----- From: Massie Maroon, CMA Sent: 10/06/2018  11:51 AM EDT To: Laurine Blazer, LPN    ----- Message ----- From: Herminio Commons, MD Sent: 10/06/2018  11:21 AM EDT To: Merlene Laughter, RN, Earland Reish [place], CMA  The following abnormalities are noted: Potassium is decreasing but it is still elevated. All other values are normal, stable or within acceptable limits. Medication changes / Follow up labs / Other changes or recommendations:   Repeat Kayexalate 15 g twice daily for 3 doses (3rd dose tomorrow evening) and repeat basic metabolic panel tomorrow. Kate Sable, MD 10/06/2018 11:20 AM

## 2018-10-06 NOTE — Telephone Encounter (Signed)
Pt wife aware and voiced understanding says they will have labs done after 3 pm tomorrow due to transportation

## 2018-10-07 ENCOUNTER — Other Ambulatory Visit (HOSPITAL_COMMUNITY)
Admission: RE | Admit: 2018-10-07 | Discharge: 2018-10-07 | Disposition: A | Discharge status: Home / Self Care | Payer: PPO | Source: Ambulatory Visit | Attending: Cardiovascular Disease | Admitting: Cardiovascular Disease

## 2018-10-07 ENCOUNTER — Telehealth: Payer: Self-pay | Admitting: *Deleted

## 2018-10-07 DIAGNOSIS — E875 Hyperkalemia: Secondary | ICD-10-CM | POA: Insufficient documentation

## 2018-10-07 LAB — BASIC METABOLIC PANEL
ANION GAP: 8 (ref 5–15)
BUN: 38 mg/dL — ABNORMAL HIGH (ref 8–23)
CALCIUM: 9.3 mg/dL (ref 8.9–10.3)
CO2: 23 mmol/L (ref 22–32)
Chloride: 104 mmol/L (ref 98–111)
Creatinine, Ser: 1.66 mg/dL — ABNORMAL HIGH (ref 0.61–1.24)
GFR calc Af Amer: 45 mL/min — ABNORMAL LOW (ref >60)
GFR, EST NON AFRICAN AMERICAN: 39 mL/min — AB (ref >60)
GLUCOSE: 117 mg/dL — AB (ref 70–99)
POTASSIUM: 4.5 mmol/L (ref 3.5–5.1)
Sodium: 135 mmol/L (ref 135–145)

## 2018-10-07 NOTE — Telephone Encounter (Signed)
-----   Message from Arnoldo Lenis, MD sent at 10/07/2018  4:16 PM EDT ----- Repeat potassium today is normal. Please verify he is off potassium and if so take off his list. No other med changes at this time. Will discuss everything at our [place] MD

## 2018-10-07 NOTE — Telephone Encounter (Signed)
Pt aware and verified he would no longer take potassium - updated medication list

## 2018-10-12 ENCOUNTER — Other Ambulatory Visit: Payer: Self-pay

## 2018-10-12 ENCOUNTER — Ambulatory Visit (INDEPENDENT_AMBULATORY_CARE_PROVIDER_SITE_OTHER): Payer: PPO

## 2018-10-12 DIAGNOSIS — I5022 Chronic systolic (congestive) heart failure: Secondary | ICD-10-CM

## 2018-10-16 DIAGNOSIS — Z9181 History of falling: Secondary | ICD-10-CM | POA: Diagnosis not present

## 2018-10-16 DIAGNOSIS — I5043 Acute on chronic combined systolic (congestive) and diastolic (congestive) heart failure: Secondary | ICD-10-CM | POA: Diagnosis not present

## 2018-10-17 ENCOUNTER — Telehealth: Payer: Self-pay | Admitting: *Deleted

## 2018-10-17 NOTE — Telephone Encounter (Signed)
-----   Message from Laurine Blazer, LPN sent at 81/05/8386 12:15 PM EDT -----   ----- Message ----- From: Arnoldo Lenis, MD Sent: 10/14/2018  10:25 AM EDT To: Laurine Blazer, LPN  Mild improvement in heart function since last check. Now in the low normal to mildly decreased range. Will discus further at our next f/u and readdress any further testing we may need to consider   J BrancH MD

## 2018-10-17 NOTE — Telephone Encounter (Signed)
Pt wife Lucita Ferrara [facility]) made aware and voiced understanding - routed to pcp

## 2018-10-19 DIAGNOSIS — N183 Chronic kidney disease, stage 3 (moderate): Secondary | ICD-10-CM | POA: Diagnosis not present

## 2018-10-19 DIAGNOSIS — I10 Essential (primary) hypertension: Secondary | ICD-10-CM | POA: Diagnosis not present

## 2018-10-19 DIAGNOSIS — I5042 Chronic combined systolic (congestive) and diastolic (congestive) heart failure: Secondary | ICD-10-CM | POA: Diagnosis not present

## 2018-10-19 DIAGNOSIS — F0281 Dementia in other diseases classified elsewhere with behavioral disturbance: Secondary | ICD-10-CM | POA: Diagnosis not present

## 2018-10-19 DIAGNOSIS — E782 Mixed hyperlipidemia: Secondary | ICD-10-CM | POA: Diagnosis not present

## 2018-10-19 DIAGNOSIS — E039 Hypothyroidism, unspecified: Secondary | ICD-10-CM | POA: Diagnosis not present

## 2018-10-25 ENCOUNTER — Telehealth: Payer: Self-pay | Admitting: *Deleted

## 2018-10-25 ENCOUNTER — Encounter: Payer: Self-pay | Admitting: *Deleted

## 2018-10-25 NOTE — Telephone Encounter (Signed)
-----   Message from Herminio Commons, MD sent at 10/17/2018  4:44 PM EST ----- The following labs are stable without significant clinical change: Potassium has normalized.  Renal function has improved. All other results are normal or within acceptable limits. Medication changes / Follow up labs / Other changes or recommendations:   As per Dr. Neldon Labella, MD 10/17/2018 4:43 PM

## 2018-10-25 NOTE — Telephone Encounter (Signed)
Left several messages - mailed pt letter - routed to pcp

## 2018-11-09 DIAGNOSIS — R5381 Other malaise: Secondary | ICD-10-CM | POA: Diagnosis not present

## 2018-11-09 DIAGNOSIS — T1490XA Injury, unspecified, initial encounter: Secondary | ICD-10-CM | POA: Diagnosis not present

## 2018-11-15 DIAGNOSIS — Z9181 History of falling: Secondary | ICD-10-CM | POA: Diagnosis not present

## 2018-11-15 DIAGNOSIS — I5043 Acute on chronic combined systolic (congestive) and diastolic (congestive) heart failure: Secondary | ICD-10-CM | POA: Diagnosis not present

## 2018-11-23 ENCOUNTER — Ambulatory Visit (INDEPENDENT_AMBULATORY_CARE_PROVIDER_SITE_OTHER): Payer: PPO | Admitting: Cardiology

## 2018-11-23 ENCOUNTER — Telehealth: Payer: Self-pay | Admitting: *Deleted

## 2018-11-23 ENCOUNTER — Encounter: Payer: Self-pay | Admitting: Cardiology

## 2018-11-23 ENCOUNTER — Other Ambulatory Visit (HOSPITAL_COMMUNITY)
Admission: RE | Admit: 2018-11-23 | Discharge: 2018-11-23 | Disposition: A | Discharge status: Home / Self Care | Payer: PPO | Source: Ambulatory Visit | Attending: Cardiology | Admitting: Cardiology

## 2018-11-23 VITALS — BP 214/84 | HR 68 | Ht 68.5 in | Wt 187.6 lb

## 2018-11-23 DIAGNOSIS — I5043 Acute on chronic combined systolic (congestive) and diastolic (congestive) heart failure: Secondary | ICD-10-CM | POA: Insufficient documentation

## 2018-11-23 DIAGNOSIS — E875 Hyperkalemia: Secondary | ICD-10-CM

## 2018-11-23 DIAGNOSIS — I10 Essential (primary) hypertension: Secondary | ICD-10-CM | POA: Insufficient documentation

## 2018-11-23 DIAGNOSIS — I5022 Chronic systolic (congestive) heart failure: Secondary | ICD-10-CM

## 2018-11-23 LAB — MAGNESIUM: MAGNESIUM: 2.3 mg/dL (ref 1.7–2.4)

## 2018-11-23 LAB — BASIC METABOLIC PANEL WITH GFR
Anion gap: 6 (ref 5–15)
BUN: 45 mg/dL — ABNORMAL HIGH (ref 8–23)
CO2: 23 mmol/L (ref 22–32)
Calcium: 9.6 mg/dL (ref 8.9–10.3)
Chloride: 104 mmol/L (ref 98–111)
Creatinine, Ser: 1.83 mg/dL — ABNORMAL HIGH (ref 0.61–1.24)
GFR calc Af Amer: 41 mL/min — ABNORMAL LOW
GFR calc non Af Amer: 35 mL/min — ABNORMAL LOW
Glucose, Bld: 91 mg/dL (ref 70–99)
Potassium: 5.7 mmol/L — ABNORMAL HIGH (ref 3.5–5.1)
Sodium: 133 mmol/L — ABNORMAL LOW (ref 135–145)

## 2018-11-23 MED ORDER — SODIUM POLYSTYRENE SULFONATE 15 GM/60ML PO SUSP: 30.0000 g | ORAL | 0 refills | Status: DC

## 2018-11-23 NOTE — Telephone Encounter (Signed)
Wife informed and verbalized understanding of plan. Correction on dosage of kayexelate 30 g x's 2 doses. Lab order faxed to [facility].

## 2018-11-23 NOTE — Patient Instructions (Signed)
Medication Instructions:  Continue all current medications.  Labwork:  BMET, Magnesium - orders given today.   Office will contact with results via phone or letter.    Testing/Procedures: none  Follow-Up: Your physician wants you to follow up in: 6 months.  You will receive a reminder letter in the mail one-two months in advance.  If you don't receive a letter, please call our office to schedule the follow up appointment   Any Other Special Instructions Will Be Listed Below (If Applicable).  If you need a refill on your cardiac medications before your next appointment, please call your pharmacy.  

## 2018-11-23 NOTE — Telephone Encounter (Signed)
-----   Message from Arnoldo Lenis, MD sent at 11/23/2018 12:55 PM EST ----- Potassium is too high. He was to have stopped his losartan and spironolactone a few months ago, I am not sure why he continued to take it. HIs wife seems to know his medications much better, please speak with her and have her pull aside the losartan and aldactone while on the phone. Please be sure there is no potassium chloride that he is taking at home. Because his potassium is high again will have to take kayexelate 30mg  po x 2 doses, repeat BMET/Mg on Friday. If they need to bring his meds in for a nursing visit to clarify these are stopped that can be arranged as well.    Zandra Abts MD

## 2018-11-23 NOTE — Progress Notes (Signed)
Clinical Summary [name] is a 77 y.o.male seen today for follow up of the following medical problems.   1. Chronic systolic HF - admission 0/9735 with volume overload.  - 6/2019Echo LVEF 32%,DJMEQ II diastolic dyscuntion, apical hypokinesis,density attached to MV subvalvular apparatus - ischemic testing not pursued due to AKI at the time and dementia   - medical therapy limited by prior hyperkalemia and AKI. Aldactone stopped, losartan stopped, KCl stopped, and lasix decreased - appears however after speaking with patients wife and pharmacy that he did not make these changes  - denies any chest pain, no SOB or DOE   2. MV density - 05/2018 TEE ruptured chordae tendinae    Past Medical History:  Diagnosis Date  . Anxiety   . GERD (gastroesophageal reflux disease)   . Hyperlipidemia   . Hypertension   . Myocardial infarction ([place])   . Neuromuscular disorder ([place])   . Parkinson's disease ([place])   . Peripheral vascular disease (HCC)      Allergies  Allergen Reactions  . Ciprofloxacin Other (See Comments)    Patient just knows that the was told that he was allergic     Current Outpatient Medications  Medication Sig Dispense Refill  . acetaminophen (TYLENOL) 650 MG CR tablet Take 650-1,300 mg by mouth daily as needed for pain.     Marland Kitchen aspirin EC 81 MG tablet Take 81 mg by mouth daily.    . carbidopa-levodopa (SINEMET) 25-100 MG per tablet Take 1 tablet by mouth 2 (two) times daily.     . carvedilol (COREG) 12.5 MG tablet Take 1 tablet (12.5 mg total) by mouth 2 (two) times daily with a meal. 180 tablet 3  . donepezil (ARICEPT) 5 MG tablet Take 5 mg by mouth at bedtime.    . DULoxetine (CYMBALTA) 60 MG capsule Take 60 mg by mouth at bedtime.     . folic acid (FOLVITE) 1 MG tablet Take 1 mg by mouth at bedtime.     . hydrOXYzine (ATARAX/VISTARIL) 25 MG tablet Take 25 mg by mouth at bedtime.    Marland Kitchen levothyroxine (SYNTHROID, LEVOTHROID) 88 MCG tablet Take 88 mcg  by mouth daily before breakfast.    . Multiple Vitamins-Minerals (MULTIVITAMIN WITH MINERALS) tablet Take 1 tablet by mouth every morning.     Marland Kitchen oxybutynin (DITROPAN) 5 MG tablet Take 5 mg by mouth 2 (two) times daily.    . rasagiline (AZILECT) 0.5 MG TABS Take 0.5 mg by mouth at bedtime.     . simvastatin (ZOCOR) 10 MG tablet Take 10 mg by mouth at bedtime.      . sodium polystyrene (KAYEXALATE) powder Take 15 grams twice daily for 3 doses - 3rd dose tomorrow morning 45 g 0   No current facility-administered medications for this visit.      Past Surgical History:  Procedure Laterality Date  . BLADDER REPAIR     pt sts "when I had my prostatectomy they cut too much and I have an internal button I have to press in order to release my urine"  . BLADDER SURGERY     2010  pump placed   . CATARACT EXTRACTION W/PHACO  07/28/2011   Procedure: CATARACT EXTRACTION PHACO AND INTRAOCULAR LENS PLACEMENT (IOC);  Surgeon: Elta Guadeloupe T. Gershon Crane;  Location: [facility];  Service: Ophthalmology;  Laterality: Right;  CDE: 20.26  . CATARACT EXTRACTION W/PHACO  09/08/2011   Procedure: CATARACT EXTRACTION PHACO AND INTRAOCULAR LENS PLACEMENT (IOC);  Surgeon: Elta Guadeloupe T. Gershon Crane;  Location: [facility];  Service: Ophthalmology;  Laterality: Left;  CDE:37.31  . ENDARTERECTOMY Right 10/07/2016   Procedure: RIGHT ENDARTERECTOMY CAROTID WITH LIGATION OF INTERNAL CAROTID;  Surgeon: Serafina Mitchell, MD;  Location: [place];  Service: Vascular;  Laterality: Right;  . PATCH ANGIOPLASTY Right 10/07/2016   Procedure: [place];  Surgeon: Serafina Mitchell, MD;  Location: [place];  Service: Vascular;  Laterality: Right;  . PROSTATECTOMY    . TEE WITHOUT CARDIOVERSION N/A 06/10/2018   Procedure: TRANSESOPHAGEAL ECHOCARDIOGRAM (TEE);  Surgeon: Arnoldo Lenis, MD;  [facility];  Service: Endoscopy;  Laterality: N/A;  . YAG LASER APPLICATION  84/16/6063   Procedure: YAG LASER APPLICATION;  Surgeon:  Elta Guadeloupe T. Gershon Crane, MD;  Location: [facility];  Service: Ophthalmology;  Laterality: Right;     Allergies  Allergen Reactions  . Ciprofloxacin Other (See Comments)    Patient just knows that the was told that he was allergic      Family History  Problem Relation Age of Onset  . Cancer Mother   . Cancer Father   . Anesthesia problems Neg Hx   . Hypotension Neg Hx   . Malignant hyperthermia Neg Hx   . Pseudochol deficiency Neg Hx      Social History Mr. Kue reports that he quit smoking about 35 years ago. His smoking use included cigarettes. He has a 5.00 pack-year smoking history. He has never used smokeless tobacco. Mr. Cullen reports that he does not drink alcohol.   Review of Systems CONSTITUTIONAL: No weight loss, fever, chills, weakness or fatigue.  HEENT: Eyes: No visual loss, blurred vision, double vision or yellow sclerae.No hearing loss, sneezing, congestion, runny nose or sore throat.  SKIN: No rash or itching.  CARDIOVASCULAR: per hpi RESPIRATORY: No shortness of breath, cough or sputum.  GASTROINTESTINAL: No anorexia, nausea, vomiting or diarrhea. No abdominal pain or blood.  GENITOURINARY: No burning on urination, no polyuria NEUROLOGICAL: No headache, dizziness, syncope, paralysis, ataxia, numbness or tingling in the extremities. No change in bowel or bladder control.  MUSCULOSKELETAL: No muscle, back pain, joint pain or stiffness.  LYMPHATICS: No enlarged nodes. No history of splenectomy.  PSYCHIATRIC: No history of depression or anxiety.  ENDOCRINOLOGIC: No reports of sweating, cold or heat intolerance. No polyuria or polydipsia.  Marland Kitchen   Physical Examination Vitals:   11/23/18 0945 11/23/18 0952  BP: (!) 218/92 (!) 214/84  Pulse: 60 68  SpO2: 99% 100%   Vitals:   11/23/18 0945  Weight: 187 lb 9.6 oz (85.1 kg)  Height: 5' 8.5" (1.74 m)    Gen: resting comfortably, no acute distress HEENT: no scleral icterus, pupils equal round and reactive, no palptable  cervical adenopathy,  CV: RRR, no m/r/g, no jvd Resp: Clear to auscultation bilaterally GI: abdomen is soft, non-tender, non-distended, normal bowel sounds, no hepatosplenomegaly MSK: extremities are warm, no edema.  Skin: warm, no rash Neuro:  no focal deficits Psych: appropriate affect   Diagnostic Studies  09/2018 echo Study Conclusions  - Left ventricle: The cavity size was at the upper limits of   normal. Wall thickness was normal. Systolic function was mildly   reduced. The estimated ejection fraction was in the range of 45%   to 50%. Doppler parameters are consistent with abnormal left   ventricular relaxation (grade 1 diastolic dysfunction). Doppler   parameters are consistent with high ventricular filling pressure. - Regional wall motion abnormality: Hypokinesis of the apical   anterior, mid anteroseptal,  and apical inferior myocardium;   moderate hypokinesis of the apical septal, apical lateral, and   apical myocardium. - Aortic valve: Mildly to moderately calcified annulus. Trileaflet;   mildly calcified leaflets. There appears to be a mild degree of   calcific aortic stenosis. There was mild regurgitation. Valve   area (VTI): 1.76 cm^2. Valve area (Vmax): 1.89 cm^2. - Aorta: Very mild aortic root dilatation. - Mitral valve: There is a ruptured chordae tendinae. There appears   to be a small portion that remains attached to the posteriomedial   pappillary muscle. There is some chordal calcification. There was   mild regurgitation. - Tricuspid valve: There was mild regurgitation.  05/2018 echo Study Conclusions  - Left ventricle: The cavity size was normal. Wall thickness was   increased in a pattern of moderate LVH. Systolic function was   mildly to moderately reduced. The estimated ejection fraction was   = 40%. Features are consistent with a pseudonormal left   ventricular filling pattern, with concomitant abnormal relaxation   and increased filling pressure  (grade 2 diastolic dysfunction).   Doppler parameters are consistent with high ventricular filling   pressure. - Regional wall motion abnormality: Hypokinesis of the apical   septal, apical lateral, and apical myocardium. - Aortic valve: Moderately calcified annulus. Moderately thickened   leaflets. There was mild to moderate regurgitation. Valve area   (VTI): 2.11 cm^2. Valve area (Vmax): 1.51 cm^2. - Mitral valve: Mildly calcified annulus. Mildly thickened leaflets   . There was mild regurgitation. There isa 5.8 x 8.2 mm echolucent   circular structure that is mobile adjacent to the MV subvavular   apparatus. This may represent a ruptured calcified portion of the   subvalvular apparatus, cannot exclude possible vegetation.   Consider TEE to further evaluate. - Left atrium: The atrium was mildly dilated. - Right atrium: The atrium was mildly dilated.    Assessment and Plan   1. Chronic systolic HF - medical therapy complicated by AKI, hyperkalemia. Patient with poor understanding of medication of regimen as well, continues to be on aldactone and losartan that we had ask to stop due to severe hyperkalemia - we have asked again patient and also contacted his wife who handles his meds to throw out his aldactone and losartan. He is going for labs today to recehck potassium.  - pending labs likely will need alternative for his bp, likely would start norvac 10mg  daily. Will need to make every effort to keep his medication as simple as possible - heart function has improved with medical therapy alone. With his decreased renal function, lack of symptoms, improving LVEF, and difficulty adhering to medication regimen would not pursue cath at this time.   2. HTN - above goal. Medication limitations as reported above.  - pending labs likely start norvac 10mg  daily.      Arnoldo Lenis, M.D.

## 2018-11-24 ENCOUNTER — Encounter: Payer: Self-pay | Admitting: Cardiology

## 2018-11-25 ENCOUNTER — Other Ambulatory Visit (HOSPITAL_COMMUNITY)
Admission: RE | Admit: 2018-11-25 | Discharge: 2018-11-25 | Disposition: A | Discharge status: Home / Self Care | Payer: PPO | Source: Ambulatory Visit | Attending: Cardiology | Admitting: Cardiology

## 2018-11-25 DIAGNOSIS — E875 Hyperkalemia: Secondary | ICD-10-CM | POA: Diagnosis not present

## 2018-11-25 DIAGNOSIS — I10 Essential (primary) hypertension: Secondary | ICD-10-CM | POA: Diagnosis not present

## 2018-11-25 LAB — BASIC METABOLIC PANEL
Anion gap: 6 (ref 5–15)
BUN: 46 mg/dL — AB (ref 8–23)
CHLORIDE: 105 mmol/L (ref 98–111)
CO2: 24 mmol/L (ref 22–32)
Calcium: 9.2 mg/dL (ref 8.9–10.3)
Creatinine, Ser: 1.68 mg/dL — ABNORMAL HIGH (ref 0.61–1.24)
GFR calc Af Amer: 45 mL/min — ABNORMAL LOW (ref >60)
GFR calc non Af Amer: 39 mL/min — ABNORMAL LOW (ref >60)
GLUCOSE: 88 mg/dL (ref 70–99)
Potassium: 4.9 mmol/L (ref 3.5–5.1)
SODIUM: 135 mmol/L (ref 135–145)

## 2018-11-25 LAB — MAGNESIUM: Magnesium: 2.2 mg/dL (ref 1.7–2.4)

## 2018-12-01 ENCOUNTER — Telehealth: Payer: Self-pay | Admitting: *Deleted

## 2018-12-01 NOTE — Telephone Encounter (Signed)
Pt wife DPR Lois aware - routed to pcp

## 2018-12-01 NOTE — Telephone Encounter (Signed)
-----   Message from Merlene Laughter, RN sent at 11/29/2018  4:24 PM EST -----  ----- Message ----- From: Arnoldo Lenis, MD Sent: 11/29/2018   4:11 PM EST To: Merlene Laughter, RN  Labs look good, potassium is back to normal  J BrancH MD

## 2018-12-16 DIAGNOSIS — I5043 Acute on chronic combined systolic (congestive) and diastolic (congestive) heart failure: Secondary | ICD-10-CM | POA: Diagnosis not present

## 2018-12-16 DIAGNOSIS — Z9181 History of falling: Secondary | ICD-10-CM | POA: Diagnosis not present

## 2018-12-28 ENCOUNTER — Ambulatory Visit (HOSPITAL_COMMUNITY)
Admission: RE | Admit: 2018-12-28 | Discharge: 2018-12-28 | Disposition: A | Discharge status: Home / Self Care | Payer: PPO | Source: Ambulatory Visit | Attending: Podiatry | Admitting: Podiatry

## 2018-12-28 ENCOUNTER — Other Ambulatory Visit: Payer: Self-pay | Admitting: Podiatry

## 2018-12-28 DIAGNOSIS — R0989 Other specified symptoms and signs involving the circulatory and respiratory systems: Secondary | ICD-10-CM | POA: Diagnosis not present

## 2018-12-28 DIAGNOSIS — M79605 Pain in left leg: Secondary | ICD-10-CM

## 2018-12-28 DIAGNOSIS — M7989 Other specified soft tissue disorders: Secondary | ICD-10-CM | POA: Diagnosis not present

## 2018-12-28 DIAGNOSIS — M79672 Pain in left foot: Secondary | ICD-10-CM | POA: Diagnosis not present

## 2018-12-28 DIAGNOSIS — I739 Peripheral vascular disease, unspecified: Secondary | ICD-10-CM | POA: Diagnosis not present

## 2018-12-28 DIAGNOSIS — I824Y9 Acute embolism and thrombosis of unspecified deep veins of unspecified proximal lower extremity: Secondary | ICD-10-CM

## 2018-12-28 DIAGNOSIS — M79662 Pain in left lower leg: Secondary | ICD-10-CM | POA: Diagnosis not present

## 2018-12-29 ENCOUNTER — Ambulatory Visit (HOSPITAL_COMMUNITY)
Admission: RE | Admit: 2018-12-29 | Discharge: 2018-12-29 | Disposition: A | Discharge status: Home / Self Care | Payer: PPO | Source: Ambulatory Visit | Attending: Podiatry | Admitting: Podiatry

## 2018-12-29 DIAGNOSIS — M79605 Pain in left leg: Secondary | ICD-10-CM | POA: Diagnosis not present

## 2018-12-29 DIAGNOSIS — R200 Anesthesia of skin: Secondary | ICD-10-CM | POA: Diagnosis not present

## 2018-12-29 DIAGNOSIS — I824Y9 Acute embolism and thrombosis of unspecified deep veins of unspecified proximal lower extremity: Secondary | ICD-10-CM | POA: Insufficient documentation

## 2018-12-29 DIAGNOSIS — M7989 Other specified soft tissue disorders: Secondary | ICD-10-CM | POA: Insufficient documentation

## 2019-01-01 DIAGNOSIS — R5381 Other malaise: Secondary | ICD-10-CM | POA: Diagnosis not present

## 2019-01-01 DIAGNOSIS — R269 Unspecified abnormalities of gait and mobility: Secondary | ICD-10-CM | POA: Diagnosis not present

## 2019-01-02 DIAGNOSIS — M79672 Pain in left foot: Secondary | ICD-10-CM | POA: Diagnosis not present

## 2019-01-02 DIAGNOSIS — G20 Parkinson's disease: Secondary | ICD-10-CM | POA: Diagnosis not present

## 2019-01-02 DIAGNOSIS — M7752 Other enthesopathy of left foot: Secondary | ICD-10-CM | POA: Diagnosis not present

## 2019-01-16 DIAGNOSIS — I5043 Acute on chronic combined systolic (congestive) and diastolic (congestive) heart failure: Secondary | ICD-10-CM | POA: Diagnosis not present

## 2019-01-16 DIAGNOSIS — Z9181 History of falling: Secondary | ICD-10-CM | POA: Diagnosis not present

## 2019-01-23 DIAGNOSIS — E039 Hypothyroidism, unspecified: Secondary | ICD-10-CM | POA: Diagnosis not present

## 2019-01-23 DIAGNOSIS — I10 Essential (primary) hypertension: Secondary | ICD-10-CM | POA: Diagnosis not present

## 2019-01-23 DIAGNOSIS — E782 Mixed hyperlipidemia: Secondary | ICD-10-CM | POA: Diagnosis not present

## 2019-01-23 DIAGNOSIS — D500 Iron deficiency anemia secondary to blood loss (chronic): Secondary | ICD-10-CM | POA: Diagnosis not present

## 2019-01-30 DIAGNOSIS — F0390 Unspecified dementia without behavioral disturbance: Secondary | ICD-10-CM | POA: Diagnosis not present

## 2019-01-30 DIAGNOSIS — I5042 Chronic combined systolic (congestive) and diastolic (congestive) heart failure: Secondary | ICD-10-CM | POA: Diagnosis not present

## 2019-01-30 DIAGNOSIS — E875 Hyperkalemia: Secondary | ICD-10-CM | POA: Diagnosis not present

## 2019-01-30 DIAGNOSIS — N183 Chronic kidney disease, stage 3 (moderate): Secondary | ICD-10-CM | POA: Diagnosis not present

## 2019-01-30 DIAGNOSIS — M109 Gout, unspecified: Secondary | ICD-10-CM | POA: Diagnosis not present

## 2019-01-30 DIAGNOSIS — D649 Anemia, unspecified: Secondary | ICD-10-CM | POA: Diagnosis not present

## 2019-01-30 DIAGNOSIS — E039 Hypothyroidism, unspecified: Secondary | ICD-10-CM | POA: Diagnosis not present

## 2019-01-30 DIAGNOSIS — E782 Mixed hyperlipidemia: Secondary | ICD-10-CM | POA: Diagnosis not present

## 2019-01-30 DIAGNOSIS — I10 Essential (primary) hypertension: Secondary | ICD-10-CM | POA: Diagnosis not present

## 2019-01-30 DIAGNOSIS — G20 Parkinson's disease: Secondary | ICD-10-CM | POA: Diagnosis not present

## 2019-02-09 ENCOUNTER — Other Ambulatory Visit: Payer: Self-pay

## 2019-02-09 ENCOUNTER — Encounter (HOSPITAL_COMMUNITY): Payer: Self-pay | Admitting: Emergency Medicine

## 2019-02-09 ENCOUNTER — Inpatient Hospital Stay (HOSPITAL_COMMUNITY)
Admission: EM | Admit: 2019-02-09 | Discharge: 2019-02-14 | DRG: 280 | Disposition: A | Discharge status: Skilled Nursing Facility | Payer: PPO | Attending: Family Medicine | Admitting: Family Medicine

## 2019-02-09 ENCOUNTER — Emergency Department (HOSPITAL_COMMUNITY): Payer: PPO

## 2019-02-09 DIAGNOSIS — I5023 Acute on chronic systolic (congestive) heart failure: Secondary | ICD-10-CM | POA: Diagnosis present

## 2019-02-09 DIAGNOSIS — J189 Pneumonia, unspecified organism: Secondary | ICD-10-CM | POA: Diagnosis not present

## 2019-02-09 DIAGNOSIS — I739 Peripheral vascular disease, unspecified: Secondary | ICD-10-CM | POA: Diagnosis present

## 2019-02-09 DIAGNOSIS — R0789 Other chest pain: Secondary | ICD-10-CM

## 2019-02-09 DIAGNOSIS — R2689 Other abnormalities of gait and mobility: Secondary | ICD-10-CM | POA: Diagnosis not present

## 2019-02-09 DIAGNOSIS — T502X5A Adverse effect of carbonic-anhydrase inhibitors, benzothiadiazides and other diuretics, initial encounter: Secondary | ICD-10-CM | POA: Diagnosis not present

## 2019-02-09 DIAGNOSIS — R0602 Shortness of breath: Secondary | ICD-10-CM | POA: Diagnosis present

## 2019-02-09 DIAGNOSIS — Z961 Presence of intraocular lens: Secondary | ICD-10-CM | POA: Diagnosis present

## 2019-02-09 DIAGNOSIS — G20 Parkinson's disease: Secondary | ICD-10-CM | POA: Diagnosis present

## 2019-02-09 DIAGNOSIS — I10 Essential (primary) hypertension: Secondary | ICD-10-CM | POA: Diagnosis not present

## 2019-02-09 DIAGNOSIS — K449 Diaphragmatic hernia without obstruction or gangrene: Secondary | ICD-10-CM | POA: Diagnosis present

## 2019-02-09 DIAGNOSIS — F0281 Dementia in other diseases classified elsewhere with behavioral disturbance: Secondary | ICD-10-CM | POA: Diagnosis present

## 2019-02-09 DIAGNOSIS — I5043 Acute on chronic combined systolic (congestive) and diastolic (congestive) heart failure: Secondary | ICD-10-CM

## 2019-02-09 DIAGNOSIS — I513 Intracardiac thrombosis, not elsewhere classified: Secondary | ICD-10-CM | POA: Diagnosis present

## 2019-02-09 DIAGNOSIS — K219 Gastro-esophageal reflux disease without esophagitis: Secondary | ICD-10-CM | POA: Diagnosis present

## 2019-02-09 DIAGNOSIS — J181 Lobar pneumonia, unspecified organism: Secondary | ICD-10-CM | POA: Diagnosis present

## 2019-02-09 DIAGNOSIS — E039 Hypothyroidism, unspecified: Secondary | ICD-10-CM | POA: Diagnosis present

## 2019-02-09 DIAGNOSIS — R079 Chest pain, unspecified: Secondary | ICD-10-CM | POA: Diagnosis present

## 2019-02-09 DIAGNOSIS — Z9842 Cataract extraction status, left eye: Secondary | ICD-10-CM

## 2019-02-09 DIAGNOSIS — Z9181 History of falling: Secondary | ICD-10-CM | POA: Diagnosis not present

## 2019-02-09 DIAGNOSIS — Z7982 Long term (current) use of aspirin: Secondary | ICD-10-CM

## 2019-02-09 DIAGNOSIS — I2510 Atherosclerotic heart disease of native coronary artery without angina pectoris: Secondary | ICD-10-CM | POA: Diagnosis present

## 2019-02-09 DIAGNOSIS — R296 Repeated falls: Secondary | ICD-10-CM | POA: Diagnosis present

## 2019-02-09 DIAGNOSIS — J9601 Acute respiratory failure with hypoxia: Secondary | ICD-10-CM | POA: Diagnosis present

## 2019-02-09 DIAGNOSIS — F02818 Dementia in other diseases classified elsewhere, unspecified severity, with other behavioral disturbance: Secondary | ICD-10-CM | POA: Diagnosis present

## 2019-02-09 DIAGNOSIS — I712 Thoracic aortic aneurysm, without rupture: Secondary | ICD-10-CM | POA: Diagnosis present

## 2019-02-09 DIAGNOSIS — Z87891 Personal history of nicotine dependence: Secondary | ICD-10-CM

## 2019-02-09 DIAGNOSIS — D638 Anemia in other chronic diseases classified elsewhere: Secondary | ICD-10-CM | POA: Diagnosis present

## 2019-02-09 DIAGNOSIS — I130 Hypertensive heart and chronic kidney disease with heart failure and stage 1 through stage 4 chronic kidney disease, or unspecified chronic kidney disease: Secondary | ICD-10-CM | POA: Diagnosis present

## 2019-02-09 DIAGNOSIS — N1831 Chronic kidney disease, stage 3a: Secondary | ICD-10-CM | POA: Diagnosis present

## 2019-02-09 DIAGNOSIS — Z79890 Hormone replacement therapy (postmenopausal): Secondary | ICD-10-CM

## 2019-02-09 DIAGNOSIS — N183 Chronic kidney disease, stage 3 (moderate): Secondary | ICD-10-CM | POA: Diagnosis present

## 2019-02-09 DIAGNOSIS — R41841 Cognitive communication deficit: Secondary | ICD-10-CM | POA: Diagnosis not present

## 2019-02-09 DIAGNOSIS — F0391 Unspecified dementia with behavioral disturbance: Secondary | ICD-10-CM | POA: Diagnosis not present

## 2019-02-09 DIAGNOSIS — M6281 Muscle weakness (generalized): Secondary | ICD-10-CM | POA: Diagnosis not present

## 2019-02-09 DIAGNOSIS — I511 Rupture of chordae tendineae, not elsewhere classified: Secondary | ICD-10-CM | POA: Diagnosis present

## 2019-02-09 DIAGNOSIS — I252 Old myocardial infarction: Secondary | ICD-10-CM | POA: Diagnosis not present

## 2019-02-09 DIAGNOSIS — Z8673 Personal history of transient ischemic attack (TIA), and cerebral infarction without residual deficits: Secondary | ICD-10-CM

## 2019-02-09 DIAGNOSIS — E785 Hyperlipidemia, unspecified: Secondary | ICD-10-CM | POA: Diagnosis present

## 2019-02-09 DIAGNOSIS — R279 Unspecified lack of coordination: Secondary | ICD-10-CM | POA: Diagnosis not present

## 2019-02-09 DIAGNOSIS — F419 Anxiety disorder, unspecified: Secondary | ICD-10-CM | POA: Diagnosis present

## 2019-02-09 DIAGNOSIS — I214 Non-ST elevation (NSTEMI) myocardial infarction: Secondary | ICD-10-CM | POA: Diagnosis present

## 2019-02-09 DIAGNOSIS — J9621 Acute and chronic respiratory failure with hypoxia: Secondary | ICD-10-CM | POA: Diagnosis not present

## 2019-02-09 DIAGNOSIS — R32 Unspecified urinary incontinence: Secondary | ICD-10-CM | POA: Diagnosis present

## 2019-02-09 DIAGNOSIS — F339 Major depressive disorder, recurrent, unspecified: Secondary | ICD-10-CM | POA: Diagnosis not present

## 2019-02-09 DIAGNOSIS — Z743 Need for continuous supervision: Secondary | ICD-10-CM | POA: Diagnosis not present

## 2019-02-09 DIAGNOSIS — I351 Nonrheumatic aortic (valve) insufficiency: Secondary | ICD-10-CM | POA: Diagnosis not present

## 2019-02-09 DIAGNOSIS — M79606 Pain in leg, unspecified: Secondary | ICD-10-CM

## 2019-02-09 DIAGNOSIS — Z9841 Cataract extraction status, right eye: Secondary | ICD-10-CM

## 2019-02-09 DIAGNOSIS — Z79899 Other long term (current) drug therapy: Secondary | ICD-10-CM

## 2019-02-09 DIAGNOSIS — I2583 Coronary atherosclerosis due to lipid rich plaque: Secondary | ICD-10-CM | POA: Diagnosis not present

## 2019-02-09 HISTORY — DX: Unspecified dementia, unspecified severity, without behavioral disturbance, psychotic disturbance, mood disturbance, and anxiety: F03.90

## 2019-02-09 HISTORY — DX: Unspecified urinary incontinence: R32

## 2019-02-09 HISTORY — DX: Rheumatic mitral valve disease, unspecified: I05.9

## 2019-02-09 HISTORY — DX: Chronic combined systolic (congestive) and diastolic (congestive) heart failure: I50.42

## 2019-02-09 HISTORY — DX: Cardiomyopathy, unspecified: I42.9

## 2019-02-09 LAB — URINALYSIS, ROUTINE W REFLEX MICROSCOPIC
Bacteria, UA: NONE SEEN
Bilirubin Urine: NEGATIVE
Glucose, UA: NEGATIVE mg/dL
Ketones, ur: NEGATIVE mg/dL
Leukocytes,Ua: NEGATIVE
Nitrite: NEGATIVE
Protein, ur: 30 mg/dL — AB
Specific Gravity, Urine: 1.008 (ref 1.005–1.030)
pH: 6 (ref 5.0–8.0)

## 2019-02-09 LAB — BASIC METABOLIC PANEL
ANION GAP: 11 (ref 5–15)
BUN: 44 mg/dL — AB (ref 8–23)
CO2: 21 mmol/L — AB (ref 22–32)
Calcium: 9.1 mg/dL (ref 8.9–10.3)
Chloride: 105 mmol/L (ref 98–111)
Creatinine, Ser: 1.44 mg/dL — ABNORMAL HIGH (ref 0.61–1.24)
GFR calc Af Amer: 54 mL/min — ABNORMAL LOW (ref >60)
GFR, EST NON AFRICAN AMERICAN: 47 mL/min — AB (ref >60)
GLUCOSE: 138 mg/dL — AB (ref 70–99)
POTASSIUM: 4.4 mmol/L (ref 3.5–5.1)
Sodium: 137 mmol/L (ref 135–145)

## 2019-02-09 LAB — TROPONIN I: Troponin I: 1.22 ng/mL (ref <0.03)

## 2019-02-09 LAB — CBC
HCT: 30.8 % — ABNORMAL LOW (ref 39.0–52.0)
HEMOGLOBIN: 9.8 g/dL — AB (ref 13.0–17.0)
MCH: 31.5 pg (ref 26.0–34.0)
MCHC: 31.8 g/dL (ref 30.0–36.0)
MCV: 99 fL (ref 80.0–100.0)
NRBC: 0 % (ref 0.0–0.2)
Platelets: 220 10*3/uL (ref 150–400)
RBC: 3.11 MIL/uL — AB (ref 4.22–5.81)
RDW: 13.6 % (ref 11.5–15.5)
WBC: 11.1 10*3/uL — AB (ref 4.0–10.5)

## 2019-02-09 LAB — LACTIC ACID, PLASMA
Lactic Acid, Venous: 1.3 mmol/L (ref 0.5–1.9)
Lactic Acid, Venous: 2.2 mmol/L (ref 0.5–1.9)

## 2019-02-09 LAB — BRAIN NATRIURETIC PEPTIDE: B Natriuretic Peptide: 1647 pg/mL — ABNORMAL HIGH (ref 0.0–100.0)

## 2019-02-09 MED ORDER — NITROGLYCERIN IN D5W 200-5 MCG/ML-% IV SOLN
5.0000 ug/min | INTRAVENOUS | Status: DC
  Administered 2019-02-09: 5 ug/min via INTRAVENOUS
  Filled 2019-02-09: qty 250

## 2019-02-09 MED ORDER — FUROSEMIDE 10 MG/ML IJ SOLN
40.0000 mg | Freq: Once | INTRAMUSCULAR | Status: AC
  Administered 2019-02-09: 40 mg via INTRAVENOUS
  Filled 2019-02-09: qty 4

## 2019-02-09 MED ORDER — MORPHINE SULFATE (PF) 2 MG/ML IV SOLN
2.0000 mg | Freq: Once | INTRAVENOUS | Status: AC
  Administered 2019-02-09: 2 mg via INTRAVENOUS
  Filled 2019-02-09: qty 1

## 2019-02-09 MED ORDER — VANCOMYCIN HCL IN DEXTROSE 1-5 GM/200ML-% IV SOLN
1000.0000 mg | Freq: Once | INTRAVENOUS | Status: AC
  Administered 2019-02-09: 1000 mg via INTRAVENOUS
  Filled 2019-02-09: qty 200

## 2019-02-09 MED ORDER — SODIUM CHLORIDE 0.9% FLUSH: 3.0000 mL | Freq: Once | INTRAVENOUS | Status: DC

## 2019-02-09 MED ORDER — PIPERACILLIN-TAZOBACTAM 3.375 G IVPB 30 MIN
3.3750 g | Freq: Once | INTRAVENOUS | Status: AC
  Administered 2019-02-09: 3.375 g via INTRAVENOUS
  Filled 2019-02-09: qty 50

## 2019-02-09 MED ORDER — FUROSEMIDE 10 MG/ML IJ SOLN: 40.0000 mg | Freq: Once | INTRAMUSCULAR | Status: DC

## 2019-02-09 NOTE — ED Provider Notes (Signed)
[facility] EMERGENCY DEPARTMENT Provider Note   CSN: [id number] Arrival date & time: 02/09/19  2042    History   Chief Complaint Chief Complaint  Patient presents with  . Chest Pain    HPI [name] is a 78 y.o. male.     Patient complains of shortness of breath and chest discomfort today.  No fevers no chills no cough  The history is provided by the patient. No language interpreter was used.  Chest Pain  Pain location:  L chest Pain quality: aching   Pain radiates to:  Does not radiate Pain severity:  Moderate Onset quality:  Sudden Timing:  Constant Progression:  Waxing and waning Chronicity:  New Context: not breathing   Relieved by:  Nothing Associated symptoms: no abdominal pain, no back pain, no cough, no fatigue and no headache     Past Medical History:  Diagnosis Date  . Anxiety   . GERD (gastroesophageal reflux disease)   . Hyperlipidemia   . Hypertension   . Myocardial infarction ([place])   . Neuromuscular disorder ([place])   . Parkinson's disease ([place])   . Peripheral vascular disease [facility])     Patient Active Problem List   Diagnosis Date Noted  . Acute on chronic combined systolic and diastolic HF (heart failure) ([place])   . Dementia due to Parkinson's disease with behavioral disturbance ([place])   . Hyperlipidemia   . Essential hypertension   . CKD (chronic kidney disease) stage 3, GFR 30-59 ml/min (HCC)   . Elevated troponin   . Hypokalemia   . SOB (shortness of breath) 06/08/2018  . Symptomatic stenosis of right carotid artery 10/07/2016  . Preoperative cardiovascular examination 09/02/2016  . Abnormal EKG 09/02/2016  . Murmur 09/02/2016  . Carotid artery stenosis with cerebral infarction ([place]) 09/02/2016    Past Surgical History:  Procedure Laterality Date  . BLADDER REPAIR     pt sts "when I had my prostatectomy they cut too much and I have an internal button I have to press in order to release my urine"  . BLADDER SURGERY     2010   pump placed   . CATARACT EXTRACTION W/PHACO  07/28/2011   Procedure: CATARACT EXTRACTION PHACO AND INTRAOCULAR LENS PLACEMENT (IOC);  Surgeon: Elta Guadeloupe T. Gershon Crane;  Location: [facility];  Service: Ophthalmology;  Laterality: Right;  CDE: 20.26  . CATARACT EXTRACTION W/PHACO  09/08/2011   Procedure: CATARACT EXTRACTION PHACO AND INTRAOCULAR LENS PLACEMENT (IOC);  Surgeon: Elta Guadeloupe T. Gershon Crane;  Location: [facility];  Service: Ophthalmology;  Laterality: Left;  CDE:37.31  . ENDARTERECTOMY Right 10/07/2016   Procedure: RIGHT ENDARTERECTOMY CAROTID WITH LIGATION OF INTERNAL CAROTID;  Surgeon: Serafina Mitchell, MD;  Location: [place];  Service: Vascular;  Laterality: Right;  . PATCH ANGIOPLASTY Right 10/07/2016   Procedure: [place];  Surgeon: Serafina Mitchell, MD;  Location: [place];  Service: Vascular;  Laterality: Right;  . PROSTATECTOMY    . TEE WITHOUT CARDIOVERSION N/A 06/10/2018   Procedure: TRANSESOPHAGEAL ECHOCARDIOGRAM (TEE);  Surgeon: Arnoldo Lenis, MD;  [facility];  Service: Endoscopy;  Laterality: N/A;  . YAG LASER APPLICATION  92/10/9416   Procedure: YAG LASER APPLICATION;  Surgeon: Elta Guadeloupe T. Gershon Crane, MD;  Location: [facility];  Service: Ophthalmology;  Laterality: Right;        Home Medications    Prior to Admission medications   Medication Sig Start Date End Date Taking? Authorizing Provider  acetaminophen (TYLENOL) 650 MG CR tablet Take  650-1,300 mg by mouth daily as needed for pain.     [provider]  aspirin EC 81 MG tablet Take 81 mg by mouth daily.    [provider]  carbidopa-levodopa (SINEMET) 25-100 MG per tablet Take 1 tablet by mouth 2 (two) times daily.     [provider]  carvedilol (COREG) 12.5 MG tablet Take 1 tablet (12.5 mg total) by mouth 2 (two) times daily with a meal. 08/01/18   Branch, Alphonse Guild, MD  DULoxetine (CYMBALTA) 60 MG capsule Take 60 mg by mouth at bedtime.     [provider]  folic  acid (FOLVITE) 1 MG tablet Take 1 mg by mouth at bedtime.     [provider]  hydrOXYzine (ATARAX/VISTARIL) 25 MG tablet Take 25 mg by mouth at bedtime.    [provider]  levothyroxine (SYNTHROID, LEVOTHROID) 88 MCG tablet Take 88 mcg by mouth daily before breakfast.    [provider]  meloxicam (MOBIC) 15 MG tablet Take 1 tablet by mouth daily. 10/23/18   [provider]  Multiple Vitamins-Minerals (MULTIVITAMIN WITH MINERALS) tablet Take 1 tablet by mouth every morning.     [provider]  oxybutynin (DITROPAN) 5 MG tablet Take 5 mg by mouth 2 (two) times daily. 05/07/15   [provider]  rasagiline (AZILECT) 1 MG TABS tablet Take 1 tablet by mouth daily. 11/21/18   [provider]  simvastatin (ZOCOR) 10 MG tablet Take 10 mg by mouth at bedtime.      [provider]  sodium polystyrene (KAYEXALATE) 15 GM/60ML suspension Take 120 mLs (30 g total) by mouth as directed. 30 g by mouth today and tomorrow (2 doses) 11/23/18   Branch, Alphonse Guild, MD    Family History Family History  Problem Relation Age of Onset  . Cancer Mother   . Cancer Father   . Anesthesia problems Neg Hx   . Hypotension Neg Hx   . Malignant hyperthermia Neg Hx   . Pseudochol deficiency Neg Hx     Social History Social History   Tobacco Use  . Smoking status: Former Smoker    Packs/day: 0.50    Years: 10.00    Pack years: 5.00    Types: Cigarettes    Last attempt to quit: 07/24/1983    Years since quitting: 35.5  . Smokeless tobacco: Never Used  Substance Use Topics  . Alcohol use: No  . Drug use: No     Allergies   Patient has no known allergies.   Review of Systems Review of Systems  Constitutional: Negative for appetite change and fatigue.  HENT: Negative for congestion, ear discharge and sinus pressure.   Eyes: Negative for discharge.  Respiratory: Negative for cough.   Cardiovascular: Positive for chest pain.    Gastrointestinal: Negative for abdominal pain and diarrhea.  Genitourinary: Negative for frequency and hematuria.  Musculoskeletal: Negative for back pain.       Bilateral edema in the legs  Skin: Negative for rash.  Neurological: Negative for seizures and headaches.  Psychiatric/Behavioral: Negative for hallucinations.     Physical Exam Updated Vital Signs BP (!) 145/92   Pulse (!) 102   Temp 98.8 F (37.1 C) (Rectal)   Resp (!) 25   Ht 5\' 8"  (1.727 m)   Wt 72.6 kg   SpO2 96%   BMI 24.33 kg/m   Physical Exam Vitals signs and nursing note reviewed.  Constitutional:      Appearance: He  is well-developed.  HENT:     Head: Normocephalic.     Nose: Nose normal.  Eyes:     General: No scleral icterus.    Conjunctiva/sclera: Conjunctivae normal.  Neck:     Musculoskeletal: Neck supple.     Thyroid: No thyromegaly.  Cardiovascular:     Rate and Rhythm: Normal rate and regular rhythm.     Heart sounds: No murmur. No friction rub. No gallop.   Pulmonary:     Breath sounds: No stridor. Rales present. No wheezing.  Chest:     Chest wall: No tenderness.  Abdominal:     General: There is no distension.     Tenderness: There is no abdominal tenderness. There is no rebound.  Musculoskeletal: Normal range of motion.     Comments: Edema in both legs  Lymphadenopathy:     Cervical: No cervical adenopathy.  Skin:    Findings: No erythema or rash.  Neurological:     Mental Status: He is oriented to person, place, and time.     Motor: No abnormal muscle tone.     Coordination: Coordination normal.  Psychiatric:        Behavior: Behavior normal.      ED Treatments / Results  Labs (all labs ordered are listed, but only abnormal results are displayed) Labs Reviewed  BASIC METABOLIC PANEL - Abnormal; Notable for the following components:      Result Value   CO2 21 (*)    Glucose, Bld 138 (*)    BUN 44 (*)    Creatinine, Ser 1.44 (*)    GFR calc non Af Amer 47 (*)     GFR calc Af Amer 54 (*)    All other components within normal limits  CBC - Abnormal; Notable for the following components:   WBC 11.1 (*)    RBC 3.11 (*)    Hemoglobin 9.8 (*)    HCT 30.8 (*)    All other components within normal limits  TROPONIN I - Abnormal; Notable for the following components:   Troponin I 1.22 (*)    All other components within normal limits  BRAIN NATRIURETIC PEPTIDE - Abnormal; Notable for the following components:   B Natriuretic Peptide 1,647.0 (*)    All other components within normal limits  LACTIC ACID, PLASMA - Abnormal; Notable for the following components:   Lactic Acid, Venous 2.2 (*)    All other components within normal limits  URINALYSIS, ROUTINE W REFLEX MICROSCOPIC - Abnormal; Notable for the following components:   Color, Urine STRAW (*)    Hgb urine dipstick SMALL (*)    Protein, ur 30 (*)    All other components within normal limits  CULTURE, BLOOD (ROUTINE X 2)  CULTURE, BLOOD (ROUTINE X 2)  LACTIC ACID, PLASMA    EKG EKG Interpretation  Date/Time:  Thursday February 09 2019 20:56:34 EST Ventricular Rate:  108 PR Interval:    QRS Duration: 127 QT Interval:  402 QTC Calculation: 539 R Axis:   69 Text Interpretation:  Sinus or ectopic atrial tachycardia Probable left atrial enlargement Left bundle branch block Baseline wander in lead(s) V1 Confirmed by Milton Ferguson [phone]) on 02/09/2019 9:09:51 PM   Radiology Dg Chest Portable 1 View  Result Date: 02/09/2019 CLINICAL DATA:  Chest pain, nausea, vomiting EXAM: PORTABLE CHEST 1 VIEW COMPARISON:  06/08/2018 FINDINGS: Cardiomegaly. Dense consolidation in the right mid and lower lung. Mild left perihilar airspace opacities. Findings most likely reflect pneumonia. Small effusions bilaterally.  No acute bony abnormality. IMPRESSION: Consolidation in the right mid and lower lung. Less pronounced left perihilar airspace opacities. Findings likely reflect pneumonia. Small bilateral effusions.  Electronically Signed   By: Rolm Baptise M.D.   On: 02/09/2019 21:26    Procedures Procedures (including critical care time)  Medications Ordered in ED Medications  sodium chloride flush (NS) 0.9 % injection 3 mL (3 mLs Intravenous Not Given 02/09/19 2100)  nitroGLYCERIN 50 mg in dextrose 5 % 250 mL (0.2 mg/mL) infusion (20 mcg/min Intravenous Rate/Dose Verify 02/09/19 2303)  furosemide (LASIX) injection 40 mg (40 mg Intravenous Given 02/09/19 2143)  vancomycin (VANCOCIN) IVPB 1000 mg/200 mL premix ( Intravenous Stopped 02/09/19 2240)  piperacillin-tazobactam (ZOSYN) IVPB 3.375 g (0 g Intravenous Stopped 02/09/19 2218)  morphine 2 MG/ML injection 2 mg (2 mg Intravenous Given 02/09/19 2252)     Initial Impression / Assessment and Plan / ED Course  I have reviewed the triage vital signs and the nursing notes.  Pertinent labs & imaging results that were available during my care of the patient were reviewed by me and considered in my medical decision making (see chart for details).        CRITICAL CARE Performed by: Milton Ferguson Total critical care time: 40 minutes Critical care time was exclusive of separately billable procedures and treating other patients. Critical care was necessary to treat or prevent imminent or life-threatening deterioration. Critical care was time spent personally by me on the following activities: development of treatment plan with patient and/or surrogate as well as nursing, discussions with consultants, evaluation of patient's response to treatment, examination of patient, obtaining history from patient or surrogate, ordering and performing treatments and interventions, ordering and review of laboratory studies, ordering and review of radiographic studies, pulse oximetry and re-evaluation of patient's condition. Chest x-ray suggest pneumonia.  Other lab work suggest congestive heart failure.  We have cultured the patient started on antibiotics and have treated the  chest pain and hypertension with Lasix and IV nitroglycerin.  I believe this is more congestive heart failure than pneumonia.  I spoke with cardiology who felt like the patient could be admitted by the hospitalist either at any [facility] or [facility].  I spoke to the hospitalist who decided it would be better for the patient be admitted over at [facility] because cardiology is there in the night.  Final Clinical Impressions(s) / ED Diagnoses   Final diagnoses:  Atypical chest pain    ED Discharge Orders    None       Milton Ferguson, MD 02/09/19 2306

## 2019-02-09 NOTE — ED Notes (Signed)
CRITICAL VALUE ALERT  Critical Value:  Troponin 1.22  Date & Time Notied:  02/09/19 & 2201 hrs  Provider Notified: Dr. Roderic Palau  Orders Received/Actions taken: N/A

## 2019-02-09 NOTE — ED Notes (Signed)
Pt was 77% on RA, denies any home O2 use. Applied 3L [place] and pt is now 93%.

## 2019-02-09 NOTE — ED Notes (Signed)
Date and time results received: 02/09/19 10:12 PM  Test: Lactic Acid Critical Value: 2.2  Name of Provider Notified: [place]  Orders Received? Or Actions Taken?: no new orders at this time.

## 2019-02-09 NOTE — ED Triage Notes (Signed)
Pt c/o chest pain with nausea and sob that started earlier today.

## 2019-02-10 ENCOUNTER — Observation Stay (HOSPITAL_COMMUNITY): Payer: PPO

## 2019-02-10 ENCOUNTER — Encounter (HOSPITAL_COMMUNITY): Payer: Self-pay | Admitting: Physician Assistant

## 2019-02-10 ENCOUNTER — Inpatient Hospital Stay (HOSPITAL_COMMUNITY)
Admission: EM | Disposition: A | Discharge status: Skilled Nursing Facility | Payer: Self-pay | Source: Home / Self Care | Attending: Family Medicine

## 2019-02-10 DIAGNOSIS — D638 Anemia in other chronic diseases classified elsewhere: Secondary | ICD-10-CM | POA: Diagnosis present

## 2019-02-10 DIAGNOSIS — I5043 Acute on chronic combined systolic (congestive) and diastolic (congestive) heart failure: Secondary | ICD-10-CM | POA: Diagnosis present

## 2019-02-10 DIAGNOSIS — I214 Non-ST elevation (NSTEMI) myocardial infarction: Secondary | ICD-10-CM | POA: Diagnosis present

## 2019-02-10 DIAGNOSIS — J181 Lobar pneumonia, unspecified organism: Secondary | ICD-10-CM | POA: Diagnosis present

## 2019-02-10 DIAGNOSIS — J9621 Acute and chronic respiratory failure with hypoxia: Secondary | ICD-10-CM | POA: Diagnosis not present

## 2019-02-10 DIAGNOSIS — R32 Unspecified urinary incontinence: Secondary | ICD-10-CM | POA: Diagnosis present

## 2019-02-10 DIAGNOSIS — E785 Hyperlipidemia, unspecified: Secondary | ICD-10-CM | POA: Diagnosis present

## 2019-02-10 DIAGNOSIS — N183 Chronic kidney disease, stage 3 (moderate): Secondary | ICD-10-CM | POA: Diagnosis present

## 2019-02-10 DIAGNOSIS — K219 Gastro-esophageal reflux disease without esophagitis: Secondary | ICD-10-CM | POA: Diagnosis present

## 2019-02-10 DIAGNOSIS — F419 Anxiety disorder, unspecified: Secondary | ICD-10-CM | POA: Diagnosis present

## 2019-02-10 DIAGNOSIS — I513 Intracardiac thrombosis, not elsewhere classified: Secondary | ICD-10-CM | POA: Diagnosis present

## 2019-02-10 DIAGNOSIS — R0789 Other chest pain: Secondary | ICD-10-CM | POA: Diagnosis present

## 2019-02-10 DIAGNOSIS — G20 Parkinson's disease: Secondary | ICD-10-CM | POA: Diagnosis present

## 2019-02-10 DIAGNOSIS — J9601 Acute respiratory failure with hypoxia: Secondary | ICD-10-CM | POA: Diagnosis present

## 2019-02-10 DIAGNOSIS — K449 Diaphragmatic hernia without obstruction or gangrene: Secondary | ICD-10-CM | POA: Diagnosis present

## 2019-02-10 DIAGNOSIS — Z961 Presence of intraocular lens: Secondary | ICD-10-CM | POA: Diagnosis present

## 2019-02-10 DIAGNOSIS — I2510 Atherosclerotic heart disease of native coronary artery without angina pectoris: Secondary | ICD-10-CM

## 2019-02-10 DIAGNOSIS — I252 Old myocardial infarction: Secondary | ICD-10-CM | POA: Diagnosis not present

## 2019-02-10 DIAGNOSIS — R079 Chest pain, unspecified: Secondary | ICD-10-CM | POA: Diagnosis not present

## 2019-02-10 DIAGNOSIS — I739 Peripheral vascular disease, unspecified: Secondary | ICD-10-CM | POA: Diagnosis present

## 2019-02-10 DIAGNOSIS — R296 Repeated falls: Secondary | ICD-10-CM | POA: Diagnosis present

## 2019-02-10 DIAGNOSIS — E039 Hypothyroidism, unspecified: Secondary | ICD-10-CM | POA: Diagnosis present

## 2019-02-10 DIAGNOSIS — Z8673 Personal history of transient ischemic attack (TIA), and cerebral infarction without residual deficits: Secondary | ICD-10-CM | POA: Diagnosis not present

## 2019-02-10 DIAGNOSIS — F0281 Dementia in other diseases classified elsewhere with behavioral disturbance: Secondary | ICD-10-CM | POA: Diagnosis present

## 2019-02-10 DIAGNOSIS — I5023 Acute on chronic systolic (congestive) heart failure: Secondary | ICD-10-CM | POA: Diagnosis not present

## 2019-02-10 DIAGNOSIS — I351 Nonrheumatic aortic (valve) insufficiency: Secondary | ICD-10-CM | POA: Diagnosis not present

## 2019-02-10 DIAGNOSIS — I712 Thoracic aortic aneurysm, without rupture: Secondary | ICD-10-CM | POA: Diagnosis present

## 2019-02-10 DIAGNOSIS — I130 Hypertensive heart and chronic kidney disease with heart failure and stage 1 through stage 4 chronic kidney disease, or unspecified chronic kidney disease: Secondary | ICD-10-CM | POA: Diagnosis present

## 2019-02-10 DIAGNOSIS — I10 Essential (primary) hypertension: Secondary | ICD-10-CM | POA: Diagnosis not present

## 2019-02-10 DIAGNOSIS — I511 Rupture of chordae tendineae, not elsewhere classified: Secondary | ICD-10-CM | POA: Diagnosis present

## 2019-02-10 HISTORY — PX: LEFT HEART CATH AND CORONARY ANGIOGRAPHY: CATH118249

## 2019-02-10 LAB — BASIC METABOLIC PANEL
Anion gap: 10 (ref 5–15)
BUN: 43 mg/dL — ABNORMAL HIGH (ref 8–23)
CO2: 24 mmol/L (ref 22–32)
Calcium: 8.8 mg/dL — ABNORMAL LOW (ref 8.9–10.3)
Chloride: 103 mmol/L (ref 98–111)
Creatinine, Ser: 1.44 mg/dL — ABNORMAL HIGH (ref 0.61–1.24)
GFR calc non Af Amer: 47 mL/min — ABNORMAL LOW (ref >60)
GFR, EST AFRICAN AMERICAN: 54 mL/min — AB (ref >60)
Glucose, Bld: 146 mg/dL — ABNORMAL HIGH (ref 70–99)
Potassium: 4.1 mmol/L (ref 3.5–5.1)
Sodium: 137 mmol/L (ref 135–145)

## 2019-02-10 LAB — HEPATIC FUNCTION PANEL
ALT: 39 U/L (ref 0–44)
AST: 142 U/L — ABNORMAL HIGH (ref 15–41)
Albumin: 3.7 g/dL (ref 3.5–5.0)
Alkaline Phosphatase: 71 U/L (ref 38–126)
Bilirubin, Direct: <0.1 mg/dL (ref 0.0–0.2)
Total Bilirubin: 0.6 mg/dL (ref 0.3–1.2)
Total Protein: 7.5 g/dL (ref 6.5–8.1)

## 2019-02-10 LAB — CBC
HCT: 27.1 % — ABNORMAL LOW (ref 39.0–52.0)
Hemoglobin: 8.6 g/dL — ABNORMAL LOW (ref 13.0–17.0)
MCH: 31.3 pg (ref 26.0–34.0)
MCHC: 31.7 g/dL (ref 30.0–36.0)
MCV: 98.5 fL (ref 80.0–100.0)
Platelets: 180 10*3/uL (ref 150–400)
RBC: 2.75 MIL/uL — ABNORMAL LOW (ref 4.22–5.81)
RDW: 13.3 % (ref 11.5–15.5)
WBC: 8.2 10*3/uL (ref 4.0–10.5)
nRBC: 0 % (ref 0.0–0.2)

## 2019-02-10 LAB — TROPONIN I
Troponin I: 1.18 ng/mL (ref <0.03)
Troponin I: 25.89 ng/mL (ref <0.03)
Troponin I: 37.67 ng/mL (ref <0.03)

## 2019-02-10 LAB — FOLATE: Folate: 45.5 ng/mL

## 2019-02-10 LAB — RETICULOCYTES
IMMATURE RETIC FRACT: 19.6 % — AB (ref 2.3–15.9)
RBC.: 2.68 MIL/uL — ABNORMAL LOW (ref 4.22–5.81)
RETIC COUNT ABSOLUTE: 33.9 10*3/uL (ref 19.0–186.0)
Retic Ct Pct: 1.3 % (ref 0.4–3.1)

## 2019-02-10 LAB — HEPARIN LEVEL (UNFRACTIONATED): HEPARIN UNFRACTIONATED: 0.29 [IU]/mL — AB (ref 0.30–0.70)

## 2019-02-10 LAB — FERRITIN: Ferritin: 202 ng/mL (ref 24–336)

## 2019-02-10 LAB — CBG MONITORING, ED: Glucose-Capillary: 113 mg/dL — ABNORMAL HIGH (ref 70–99)

## 2019-02-10 LAB — IRON AND TIBC
Iron: 20 ug/dL — ABNORMAL LOW (ref 45–182)
Saturation Ratios: 9 % — ABNORMAL LOW (ref 17.9–39.5)
TIBC: 232 ug/dL — ABNORMAL LOW (ref 250–450)
UIBC: 212 ug/dL

## 2019-02-10 LAB — PROCALCITONIN
Procalcitonin: 0.31 ng/mL
Procalcitonin: 0.37 ng/mL

## 2019-02-10 LAB — VITAMIN B12: Vitamin B-12: 535 pg/mL (ref 180–914)

## 2019-02-10 SURGERY — LEFT HEART CATH AND CORONARY ANGIOGRAPHY
Anesthesia: LOCAL

## 2019-02-10 MED ORDER — SODIUM CHLORIDE 0.9% FLUSH: 3.0000 mL | INTRAVENOUS | Status: DC | PRN

## 2019-02-10 MED ORDER — SODIUM CHLORIDE 0.9 % IV SOLN: 250.0000 mL | INTRAVENOUS | Status: DC | PRN

## 2019-02-10 MED ORDER — HEPARIN (PORCINE) IN NACL 1000-0.9 UT/500ML-% IV SOLN
INTRAVENOUS | Status: DC | PRN
  Administered 2019-02-10 (×2): 500 mL

## 2019-02-10 MED ORDER — SODIUM CHLORIDE 0.9 % IV SOLN
INTRAVENOUS | Status: AC
  Administered 2019-02-10: 20:00:00 via INTRAVENOUS

## 2019-02-10 MED ORDER — SODIUM CHLORIDE 0.9 % IV SOLN
500.0000 mg | INTRAVENOUS | Status: AC
  Administered 2019-02-10 – 2019-02-12 (×3): 500 mg via INTRAVENOUS
  Filled 2019-02-10 (×4): qty 500

## 2019-02-10 MED ORDER — HEPARIN BOLUS VIA INFUSION
4000.0000 [IU] | Freq: Once | INTRAVENOUS | Status: AC
  Administered 2019-02-10: 4000 [IU] via INTRAVENOUS

## 2019-02-10 MED ORDER — SODIUM CHLORIDE 0.9% FLUSH
3.0000 mL | Freq: Two times a day (BID) | INTRAVENOUS | Status: DC
  Administered 2019-02-10: 3 mL via INTRAVENOUS

## 2019-02-10 MED ORDER — SODIUM CHLORIDE 0.9 % IV SOLN
INTRAVENOUS | Status: AC | PRN
  Administered 2019-02-10: 10 mL/h via INTRAVENOUS

## 2019-02-10 MED ORDER — FUROSEMIDE 10 MG/ML IJ SOLN
INTRAMUSCULAR | Status: AC
  Filled 2019-02-10: qty 4

## 2019-02-10 MED ORDER — ATORVASTATIN CALCIUM 40 MG PO TABS
40.0000 mg | ORAL_TABLET | Freq: Every day | ORAL | Status: DC
  Administered 2019-02-11 – 2019-02-13 (×3): 40 mg via ORAL
  Filled 2019-02-10 (×3): qty 1

## 2019-02-10 MED ORDER — ACETAMINOPHEN 650 MG RE SUPP
650.0000 mg | Freq: Once | RECTAL | Status: AC
  Administered 2019-02-10: 650 mg via RECTAL
  Filled 2019-02-10: qty 1

## 2019-02-10 MED ORDER — PIPERACILLIN-TAZOBACTAM 3.375 G IVPB
3.3750 g | Freq: Three times a day (TID) | INTRAVENOUS | Status: DC
  Administered 2019-02-10: 3.375 g via INTRAVENOUS
  Filled 2019-02-10: qty 50

## 2019-02-10 MED ORDER — SODIUM CHLORIDE 0.9% FLUSH
3.0000 mL | INTRAVENOUS | Status: DC | PRN
  Administered 2019-02-10: 3 mL via INTRAVENOUS
  Filled 2019-02-10: qty 3

## 2019-02-10 MED ORDER — ASPIRIN EC 81 MG PO TBEC
81.0000 mg | DELAYED_RELEASE_TABLET | Freq: Every day | ORAL | Status: DC
  Administered 2019-02-10 – 2019-02-14 (×5): 81 mg via ORAL
  Filled 2019-02-10 (×5): qty 1

## 2019-02-10 MED ORDER — CARVEDILOL 12.5 MG PO TABS
12.5000 mg | ORAL_TABLET | Freq: Two times a day (BID) | ORAL | Status: DC
  Administered 2019-02-10 (×2): 12.5 mg via ORAL
  Filled 2019-02-10 (×2): qty 1

## 2019-02-10 MED ORDER — NITROGLYCERIN IN D5W 200-5 MCG/ML-% IV SOLN
5.0000 ug/min | INTRAVENOUS | Status: DC
  Administered 2019-02-10: 20 ug/min via INTRAVENOUS
  Filled 2019-02-10 (×2): qty 250

## 2019-02-10 MED ORDER — FUROSEMIDE 10 MG/ML IJ SOLN
40.0000 mg | Freq: Two times a day (BID) | INTRAMUSCULAR | Status: DC
  Administered 2019-02-10: 40 mg via INTRAVENOUS
  Filled 2019-02-10: qty 4

## 2019-02-10 MED ORDER — SODIUM CHLORIDE 0.9% FLUSH
3.0000 mL | Freq: Two times a day (BID) | INTRAVENOUS | Status: DC
  Administered 2019-02-10 – 2019-02-14 (×3): 3 mL via INTRAVENOUS

## 2019-02-10 MED ORDER — VERAPAMIL HCL 2.5 MG/ML IV SOLN
INTRAVENOUS | Status: DC | PRN
  Administered 2019-02-10: 10 mL via INTRA_ARTERIAL

## 2019-02-10 MED ORDER — VANCOMYCIN HCL IN DEXTROSE 750-5 MG/150ML-% IV SOLN
750.0000 mg | Freq: Two times a day (BID) | INTRAVENOUS | Status: DC
  Administered 2019-02-10: 750 mg via INTRAVENOUS
  Filled 2019-02-10 (×7): qty 150

## 2019-02-10 MED ORDER — HEPARIN SODIUM (PORCINE) 1000 UNIT/ML IJ SOLN
INTRAMUSCULAR | Status: DC | PRN
  Administered 2019-02-10: 4000 [IU] via INTRAVENOUS

## 2019-02-10 MED ORDER — HEPARIN (PORCINE) 25000 UT/250ML-% IV SOLN
850.0000 [IU]/h | INTRAVENOUS | Status: DC
  Administered 2019-02-10: 850 [IU]/h via INTRAVENOUS
  Filled 2019-02-10: qty 250

## 2019-02-10 MED ORDER — LIDOCAINE HCL (PF) 1 % IJ SOLN
INTRAMUSCULAR | Status: AC
  Filled 2019-02-10: qty 30

## 2019-02-10 MED ORDER — SODIUM CHLORIDE 0.9 % IV SOLN: INTRAVENOUS | Status: DC

## 2019-02-10 MED ORDER — SODIUM CHLORIDE 0.9 % IV SOLN: 500.0000 mg | INTRAVENOUS | Status: DC

## 2019-02-10 MED ORDER — SODIUM CHLORIDE 0.9 % IV SOLN
1.0000 g | INTRAVENOUS | Status: AC
  Administered 2019-02-10 – 2019-02-11 (×2): 1 g via INTRAVENOUS
  Filled 2019-02-10 (×3): qty 10

## 2019-02-10 MED ORDER — SODIUM CHLORIDE 0.9% FLUSH
3.0000 mL | Freq: Two times a day (BID) | INTRAVENOUS | Status: DC
  Administered 2019-02-10 – 2019-02-13 (×5): 3 mL via INTRAVENOUS

## 2019-02-10 MED ORDER — IOHEXOL 350 MG/ML SOLN
100.0000 mL | Freq: Once | INTRAVENOUS | Status: AC | PRN
  Administered 2019-02-10: 100 mL via INTRAVENOUS

## 2019-02-10 MED ORDER — NITROGLYCERIN 2 % TD OINT: 0.5000 [in_us] | TOPICAL_OINTMENT | Freq: Once | TRANSDERMAL | Status: DC

## 2019-02-10 MED ORDER — HEPARIN (PORCINE) 25000 UT/250ML-% IV SOLN
1350.0000 [IU]/h | INTRAVENOUS | Status: DC
  Administered 2019-02-11: 900 [IU]/h via INTRAVENOUS
  Administered 2019-02-12: 1350 [IU]/h via INTRAVENOUS
  Filled 2019-02-10 (×2): qty 250

## 2019-02-10 MED ORDER — SODIUM CHLORIDE 0.9 % IV SOLN: 1.0000 g | INTRAVENOUS | Status: DC

## 2019-02-10 MED ORDER — IOHEXOL 350 MG/ML SOLN
INTRAVENOUS | Status: DC | PRN
  Administered 2019-02-10: 100 mL via INTRA_ARTERIAL

## 2019-02-10 MED ORDER — HEPARIN (PORCINE) IN NACL 1000-0.9 UT/500ML-% IV SOLN
INTRAVENOUS | Status: AC
  Filled 2019-02-10: qty 1000

## 2019-02-10 MED ORDER — VERAPAMIL HCL 2.5 MG/ML IV SOLN
INTRAVENOUS | Status: AC
  Filled 2019-02-10: qty 2

## 2019-02-10 SURGICAL SUPPLY — 11 items
CATH OPTITORQUE TIG 4.0 5F (CATHETERS) ×2 IMPLANT
CATH VISTA GUIDE 6FR XBLAD3.5 (CATHETERS) ×2 IMPLANT
DEVICE RAD COMP TR BAND LRG (VASCULAR PRODUCTS) ×2 IMPLANT
GLIDESHEATH SLEND SS 6F .021 (SHEATH) ×2 IMPLANT
GUIDEWIRE INQWIRE 1.5J.035X260 (WIRE) ×1 IMPLANT
INQWIRE 1.5J .035X260CM (WIRE) ×2
KIT HEART LEFT (KITS) ×2 IMPLANT
PACK CARDIAC CATHETERIZATION (CUSTOM PROCEDURE TRAY) ×2 IMPLANT
SHEATH PROBE COVER 6X72 (BAG) ×2 IMPLANT
TRANSDUCER W/STOPCOCK (MISCELLANEOUS) ×2 IMPLANT
TUBING CIL FLEX 10 FLL-RA (TUBING) ×2 IMPLANT

## 2019-02-10 NOTE — ED Notes (Signed)
Patient contact for family: [name]) Phone number is [phone]

## 2019-02-10 NOTE — ED Notes (Signed)
Pt's brother requesting to a Education officer, museum consult due to not able to take care of self and for possible nursing home placement.

## 2019-02-10 NOTE — Progress Notes (Addendum)
Gave s/o to cath lab and Dr. Ellyn Hack, would hold off LV gram given CKD. Dayna Dunn PA-C

## 2019-02-10 NOTE — ED Notes (Signed)
[facility] eta 20 minutes.

## 2019-02-10 NOTE — ED Notes (Signed)
Patient given oral fluids.

## 2019-02-10 NOTE — Progress Notes (Signed)
Pharmacy Antibiotic Note  [name] is a 78 y.o. male admitted on 02/09/2019 with pneumonia and bacteremia.  Pharmacy has been consulted for vancomycin and Zosyn  dosing.  Plan: Start Zosyn 3.375g IV q8h (ext infusion) Loading dose:  Vancomycin 1g IV x1 dose Maintenance dose:  Vancomycin 750mg  IV q12h Goal vancomycin trough range:  15-20  mcg/mL Pharmacy will continue to monitor renal function, vancomycin troughs as clinically indicated, cultures and patient progress.   Height: 5\' 8"  (172.7 cm) Weight: 160 lb (72.6 kg) IBW/kg (Calculated) : 68.4  Temp (24hrs), Avg:98.8 F (37.1 C), Min:98.8 F (37.1 C), Max:98.8 F (37.1 C)  Recent Labs  Lab 02/09/19 2100 02/09/19 2138 02/09/19 2332 02/10/19 0547  WBC 11.1*  --   --  8.2  CREATININE 1.44*  --   --  1.44*  LATICACIDVEN  --  2.2* 1.3  --     Estimated Creatinine Clearance: 42.2 mL/min (A) (by C-G formula based on SCr of 1.44 mg/dL (H)).    No Known Allergies  Antimicrobials this admission: Zosyn 2/27 >>   vancomycin 2/27 >>     Microbiology results: 2/27 [facility] x2:      Thank you for allowing pharmacy to be a part of this patient's care.  Despina Pole 02/10/2019 8:02 AM

## 2019-02-10 NOTE — ED Notes (Signed)
Pt transported to ct,  

## 2019-02-10 NOTE — Progress Notes (Addendum)
ANTICOAGULATION CONSULT NOTE  Pharmacy Consult for heparin Indication: chest pain/ACS  No Known Allergies  Patient Measurements: Height: 5\' 8"  (172.7 cm) Weight: 177 lb 4 oz (80.4 kg) IBW/kg (Calculated) : 68.4 HEPARIN DW (KG): 72.6   Vital Signs: Temp: 99.1 F (37.3 C) (02/28 1704) Temp Source: Oral (02/28 1704) BP: 143/82 (02/28 1823) Pulse Rate: 92 (02/28 1823)  Labs: Recent Labs    02/09/19 2100 02/09/19 2332 02/10/19 0547 02/10/19 0756 02/10/19 1347  HGB 9.8*  --  8.6*  --   --   HCT 30.8*  --  27.1*  --   --   PLT 220  --  180  --   --   HEPARINUNFRC  --   --   --   --  0.29*  CREATININE 1.44*  --  1.44*  --   --   TROPONINI 1.22* 1.18* 25.89* 37.67*  --    Estimated Creatinine Clearance: 42.2 mL/min (A) (by C-G formula based on SCr of 1.44 mg/dL (H)).  Medical History: Past Medical History:  Diagnosis Date  . Anxiety   . Cardiomyopathy ([place])   . Chronic combined systolic and diastolic CHF (congestive heart failure) ([place])   . Dementia ([place])   . GERD (gastroesophageal reflux disease)   . Hyperlipidemia   . Hypertension   . Incontinence   . 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  . Myocardial infarction ([place])   . Neuromuscular disorder ([place])   . Parkinson's disease ([place])   . Peripheral vascular disease ([place])     Medications:  Scheduled:  . [MAR Hold] aspirin EC  81 mg Oral Daily  . [MAR Hold] atorvastatin  40 mg Oral q1800  . [MAR Hold] carvedilol  12.5 mg Oral BID WC  . [MAR Hold] sodium chloride flush  3 mL Intravenous Q12H  . [MAR Hold] sodium chloride flush  3 mL Intravenous Q12H   Infusions:  . [MAR Hold] sodium chloride    . [MAR Hold] sodium chloride    . sodium chloride    . [MAR Hold] azithromycin Stopped (02/10/19 1415)  . [MAR Hold] cefTRIAXone (ROCEPHIN)  IV Stopped (02/10/19 1253)  . heparin Stopped (02/10/19 1734)  . nitroGLYCERIN     Anti-infectives (From admission, onward)   Start     Dose/Rate Route  Frequency Ordered Stop   02/10/19 1700  cefTRIAXone (ROCEPHIN) 1 g in sodium chloride 0.9 % 100 mL IVPB  Status:  Discontinued     1 g 200 mL/hr over 30 Minutes Intravenous Every 24 hours 02/10/19 1659 02/10/19 1701   02/10/19 1700  azithromycin (ZITHROMAX) 500 mg in sodium chloride 0.9 % 250 mL IVPB  Status:  Discontinued     500 mg 250 mL/hr over 60 Minutes Intravenous Every 24 hours 02/10/19 1659 02/10/19 1701   02/10/19 1200  [MAR Hold]  cefTRIAXone (ROCEPHIN) 1 g in sodium chloride 0.9 % 100 mL IVPB     (MAR Hold since Fri 02/10/2019 at 1740. Reason: Transfer to a Procedural area.)   1 g 200 mL/hr over 30 Minutes Intravenous Every 24 hours 02/10/19 1149     02/10/19 1200  [MAR Hold]  azithromycin (ZITHROMAX) 500 mg in sodium chloride 0.9 % 250 mL IVPB     (MAR Hold since Fri 02/10/2019 at 1740. Reason: Transfer to a Procedural area.)   500 mg 250 mL/hr over 60 Minutes Intravenous Every 24 hours 02/10/19 1149     02/10/19 0900  vancomycin (VANCOCIN) IVPB 750  mg/150 ml premix  Status:  Discontinued     750 mg 150 mL/hr over 60 Minutes Intravenous Every 12 hours 02/10/19 0800 02/10/19 1143   02/10/19 0815  piperacillin-tazobactam (ZOSYN) IVPB 3.375 g  Status:  Discontinued     3.375 g 12.5 mL/hr over 240 Minutes Intravenous Every 8 hours 02/10/19 0802 02/10/19 1143   02/09/19 2130  vancomycin (VANCOCIN) IVPB 1000 mg/200 mL premix     1,000 mg 200 mL/hr over 60 Minutes Intravenous  Once 02/09/19 2123 02/09/19 2240   02/09/19 2130  piperacillin-tazobactam (ZOSYN) IVPB 3.375 g     3.375 g 100 mL/hr over 30 Minutes Intravenous  Once 02/09/19 2123 02/09/19 2218      Assessment: 78 yo male with CP s/p cath with severe multivessel disease and plans are for heparin for 48hrs. He is also noted with a  descending aortic aneurysm with associated mural thrombus. Pharmacy consulted to restart heparin 8 hours post sheath removal (removed around 6:30pm). Prior to cath heparin was infusing at 850  units/hr with heparin level of 0.29  Goal of Therapy:  Heparin level 0.3-0.7 units/ml   Plan:  -restart heparin at 900 units/hr 8 hours post sheath removal -Heparin level in 8 hours and daily wth CBC daily  Hildred Laser, PharmD Clinical Pharmacist **Pharmacist phone directory can now be found on [email or web address] ([email or web address]).  Listed under [place].

## 2019-02-10 NOTE — H&P (Signed)
History and Physical    [name] BJS:[id number] DOB: Sep 23, 1941 DOA: 02/09/2019  PCP: Celene Squibb, MD  Patient coming from: Home  Chief Complaint: Chest pain nausea   HPI: [name] is a 78 y.o. male with medical history significant of dementia, hypertension, Parkinson's who is pleasantly demented and a very poor historian comes in with complaints of chest pain and nausea.  His history is highly unreliable.  He denies any chest pain at this time he says his shoulder hurts.  He does deny any nausea or vomiting now.  He denies any fevers shortness of breath or cough.  Patient being referred for admission for chest pain of unclear etiology.  Review of Systems: As per HPI otherwise 10 point review of systems negative but highly unreliable  Past Medical History:  Diagnosis Date  . Anxiety   . GERD (gastroesophageal reflux disease)   . Hyperlipidemia   . Hypertension   . Myocardial infarction ([place])   . Neuromuscular disorder ([place])   . Parkinson's disease ([place])   . Peripheral vascular disease [facility])     Past Surgical History:  Procedure Laterality Date  . BLADDER REPAIR     pt sts "when I had my prostatectomy they cut too much and I have an internal button I have to press in order to release my urine"  . BLADDER SURGERY     2010  pump placed   . CATARACT EXTRACTION W/PHACO  07/28/2011   Procedure: CATARACT EXTRACTION PHACO AND INTRAOCULAR LENS PLACEMENT (IOC);  Surgeon: Elta Guadeloupe T. Gershon Crane;  Location: [facility];  Service: Ophthalmology;  Laterality: Right;  CDE: 20.26  . CATARACT EXTRACTION W/PHACO  09/08/2011   Procedure: CATARACT EXTRACTION PHACO AND INTRAOCULAR LENS PLACEMENT (IOC);  Surgeon: Elta Guadeloupe T. Gershon Crane;  Location: [facility];  Service: Ophthalmology;  Laterality: Left;  CDE:37.31  . ENDARTERECTOMY Right 10/07/2016   Procedure: RIGHT ENDARTERECTOMY CAROTID WITH LIGATION OF INTERNAL CAROTID;  Surgeon: Serafina Mitchell, MD;  Location: [place];  Service: Vascular;  Laterality: Right;  .  PATCH ANGIOPLASTY Right 10/07/2016   Procedure: [place];  Surgeon: Serafina Mitchell, MD;  Location: [place];  Service: Vascular;  Laterality: Right;  . PROSTATECTOMY    . TEE WITHOUT CARDIOVERSION N/A 06/10/2018   Procedure: TRANSESOPHAGEAL ECHOCARDIOGRAM (TEE);  Surgeon: Arnoldo Lenis, MD;  [facility];  Service: Endoscopy;  Laterality: N/A;  . YAG LASER APPLICATION  60/73/7106   Procedure: YAG LASER APPLICATION;  Surgeon: Elta Guadeloupe T. Gershon Crane, MD;  Location: [facility];  Service: Ophthalmology;  Laterality: Right;     reports that he quit smoking about 35 years ago. His smoking use included cigarettes. He has a 5.00 pack-year smoking history. He has never used smokeless tobacco. He reports that he does not drink alcohol or use drugs.  No Known Allergies  Family History  Problem Relation Age of Onset  . Cancer Mother   . Cancer Father   . Anesthesia problems Neg Hx   . Hypotension Neg Hx   . Malignant hyperthermia Neg Hx   . Pseudochol deficiency Neg Hx     Prior to Admission medications   Medication Sig Start Date End Date Taking? Authorizing Provider  acetaminophen (TYLENOL) 650 MG CR tablet Take 650-1,300 mg by mouth daily as needed for pain.     [provider]  aspirin EC 81 MG tablet Take 81 mg by mouth daily.    [provider]  carbidopa-levodopa (SINEMET) 25-100 MG per  tablet Take 1 tablet by mouth 2 (two) times daily.     [provider]  carvedilol (COREG) 12.5 MG tablet Take 1 tablet (12.5 mg total) by mouth 2 (two) times daily with a meal. 08/01/18   Branch, Alphonse Guild, MD  DULoxetine (CYMBALTA) 60 MG capsule Take 60 mg by mouth at bedtime.     [provider]  folic acid (FOLVITE) 1 MG tablet Take 1 mg by mouth at bedtime.     [provider]  hydrOXYzine (ATARAX/VISTARIL) 25 MG tablet Take 25 mg by mouth at bedtime.    [provider]  levothyroxine (SYNTHROID, LEVOTHROID)  88 MCG tablet Take 88 mcg by mouth daily before breakfast.    [provider]  meloxicam (MOBIC) 15 MG tablet Take 1 tablet by mouth daily. 10/23/18   [provider]  Multiple Vitamins-Minerals (MULTIVITAMIN WITH MINERALS) tablet Take 1 tablet by mouth every morning.     [provider]  oxybutynin (DITROPAN) 5 MG tablet Take 5 mg by mouth 2 (two) times daily. 05/07/15   [provider]  rasagiline (AZILECT) 1 MG TABS tablet Take 1 tablet by mouth daily. 11/21/18   [provider]  simvastatin (ZOCOR) 10 MG tablet Take 10 mg by mouth at bedtime.      [provider]  sodium polystyrene (KAYEXALATE) 15 GM/60ML suspension Take 120 mLs (30 g total) by mouth as directed. 30 g by mouth today and tomorrow (2 doses) 11/23/18   Arnoldo Lenis, MD    Physical Exam: Vitals:   02/09/19 2200 02/09/19 2230 02/09/19 2251 02/09/19 2300  BP: (!) 177/97 (!) 165/106 (!) 145/92 (!) 144/90  Pulse: (!) 105 (!) 102 (!) 102 (!) 103  Resp: (!) 26 (!) 25 (!) 25 (!) 22  Temp:      TempSrc:      SpO2: 94%  96%   Weight:      Height:          Constitutional: NAD, calm, comfortable Vitals:   02/09/19 2200 02/09/19 2230 02/09/19 2251 02/09/19 2300  BP: (!) 177/97 (!) 165/106 (!) 145/92 (!) 144/90  Pulse: (!) 105 (!) 102 (!) 102 (!) 103  Resp: (!) 26 (!) 25 (!) 25 (!) 22  Temp:      TempSrc:      SpO2: 94%  96%   Weight:      Height:       Eyes: PERRL, lids and conjunctivae normal ENMT: Mucous membranes are moist. Posterior pharynx clear of any exudate or lesions.Normal dentition.  Neck: normal, supple, no masses, no thyromegaly Respiratory: clear to auscultation bilaterally, no wheezing, no crackles. Normal respiratory effort. No accessory muscle use.  Cardiovascular: Regular rate and rhythm, no murmurs / rubs / gallops.  1-2+ extremity edema. 2+ pedal pulses. No carotid bruits.  Abdomen: no tenderness, no masses palpated. No hepatosplenomegaly.  Bowel sounds positive.  Musculoskeletal: no clubbing / cyanosis. No joint deformity upper and lower extremities. Good ROM, no contractures. Normal muscle tone.  Skin: no rashes, lesions, ulcers. No induration Neurologic: CN 2-12 grossly intact. Sensation intact, DTR normal. Strength 5/5 in all 4.  Psychiatric: Normal judgment and insight. Alert and oriented x 3. Normal mood.    Labs on Admission: I have personally reviewed following labs and imaging studies  CBC: Recent Labs  Lab 02/09/19 2100  WBC 11.1*  HGB 9.8*  HCT 30.8*  MCV 99.0  PLT 756   Basic Metabolic Panel: Recent Labs  Lab 02/09/19  2100  NA 137  K 4.4  CL 105  CO2 21*  GLUCOSE 138*  BUN 44*  CREATININE 1.44*  CALCIUM 9.1   GFR: Estimated Creatinine Clearance: 42.2 mL/min (A) (by C-G formula based on SCr of 1.44 mg/dL (H)). Liver Function Tests: No results for input(s): AST, ALT, ALKPHOS, BILITOT, PROT, ALBUMIN in the last 168 hours. No results for input(s): LIPASE, AMYLASE in the last 168 hours. No results for input(s): AMMONIA in the last 168 hours. Coagulation Profile: No results for input(s): INR, PROTIME in the last 168 hours. Cardiac Enzymes: Recent Labs  Lab 02/09/19 2100  TROPONINI 1.22*   BNP (last 3 results) No results for input(s): PROBNP in the last 8760 hours. HbA1C: No results for input(s): HGBA1C in the last 72 hours. CBG: No results for input(s): GLUCAP in the last 168 hours. Lipid Profile: No results for input(s): CHOL, HDL, LDLCALC, TRIG, CHOLHDL, LDLDIRECT in the last 72 hours. Thyroid Function Tests: No results for input(s): TSH, T4TOTAL, FREET4, T3FREE, THYROIDAB in the last 72 hours. Anemia Panel: No results for input(s): VITAMINB12, FOLATE, FERRITIN, TIBC, IRON, RETICCTPCT in the last 72 hours. Urine analysis:    Component Value Date/Time   COLORURINE STRAW (A) 02/09/2019 2123   APPEARANCEUR CLEAR 02/09/2019 2123   LABSPEC 1.008 02/09/2019 2123   PHURINE 6.0 02/09/2019  2123   GLUCOSEU NEGATIVE 02/09/2019 2123   HGBUR SMALL (A) 02/09/2019 2123   BILIRUBINUR NEGATIVE 02/09/2019 2123   [place] 02/09/2019 2123   PROTEINUR 30 (A) 02/09/2019 2123   NITRITE NEGATIVE 02/09/2019 2123   LEUKOCYTESUR NEGATIVE 02/09/2019 2123   Sepsis Labs: !!!!!!!!!!!!!!!!!!!!!!!!!!!!!!!!!!!!!!!!!!!! @LABRCNTIP (procalcitonin:4,lacticidven:4) ) Recent Results (from the past 240 hour(s))  Blood Culture (routine x 2)     Status: None (Preliminary result)   Collection Time: 02/09/19  9:34 PM  Result Value Ref Range Status   Specimen Description LEFT ANTECUBITAL  Final   Special Requests   Final    BOTTLES DRAWN AEROBIC AND ANAEROBIC Performed at [facility], [address] [place], [place] [address]    Culture PENDING  Incomplete   Report Status PENDING  Incomplete  Blood Culture (routine x 2)     Status: None (Preliminary result)   Collection Time: 02/09/19  9:36 PM  Result Value Ref Range Status   Specimen Description BLOOD LEFT HAND  Final   Special Requests   Final    BOTTLES DRAWN AEROBIC AND ANAEROBIC Blood Culture adequate volume Performed at [facility], [address], [place], [place] [address]    Culture PENDING  Incomplete   Report Status PENDING  Incomplete     Radiological Exams on Admission: Dg Chest Portable 1 View  Result Date: 02/09/2019 CLINICAL DATA:  Chest pain, nausea, vomiting EXAM: PORTABLE CHEST 1 VIEW COMPARISON:  06/08/2018 FINDINGS: Cardiomegaly. Dense consolidation in the right mid and lower lung. Mild left perihilar airspace opacities. Findings most likely reflect pneumonia. Small effusions bilaterally. No acute bony abnormality. IMPRESSION: Consolidation in the right mid and lower lung. Less pronounced left perihilar airspace opacities. Findings likely reflect pneumonia. Small bilateral effusions. Electronically Signed   By: Rolm Baptise M.D.   On: 02/09/2019 21:26    EKG: Independently reviewed.  Left bundle Old chart  reviewed Case discussed with Dr. Roderic Palau in the ED Chest x-ray reviewed consolidation the right middle lower lung  Assessment/Plan 78 year old male with atypical chest pain with poor historian history of dementia Principal Problem:   Chest pain-troponin mildly elevated.  Cardiology called at [facility] advised  patient can stay here at [facility].  Serial troponin overnight.  Cardiology consulted in the morning.  Patient placed on nitroglycerin drip due to systolic blood pressure over 200.  This is improved.  Active Problems:   Acute on chronic systolic CHF (congestive heart failure) (HCC)-placed on Lasix 40 mg IV every 12 hours.  Patient has peripheral edema volume overloaded little.    SOB (shortness of breath)-unclear there is a component of pneumonia.  Empirically cover with Vanco and Zosyn also until cultures are negative.  Patient has no white count and no fever but very poor historian    Dementia due to Parkinson's disease with behavioral disturbance (HCC)-noted    Essential hypertension-on nitroglycerin drip for now for uncontrolled blood pressure.  Clarify and resume home meds    CKD (chronic kidney disease) stage 3, GFR 30-59 ml/min (HCC)-at baseline     DVT prophylaxis: SCDs Code Status: Full Family Communication: None Disposition Plan: 1 to 2 days Consults called: Cardiology Admission status: Observation   Breaker Springer A MD [facility]  If 7PM-7AM, please contact night-coverage [email or web address] Password [email or web address]  02/10/2019, 12:05 AM

## 2019-02-10 NOTE — Consult Note (Addendum)
Cardiology Consultation:   Patient ID: [name]; [id number]; 02-04-1941   Admit date: 02/09/2019 Date of Consult: 02/10/2019  Primary Care Provider: Celene Squibb, MD Primary Cardiologist: Carlyle Dolly, MD Primary Electrophysiologist:  None  Chief Complaint: chest pain, abdominal pain, n/v, back pain  Patient Profile:   [name] is a 78 y.o. male with a hx of Dementia, chronic combined CHF, cardiomyopathy, uptured MV chordae tendinae, CKD III, chronic appearing anemia, anxiety, GERD, HTN, HLD, Parkinson's disease, incontinence (wears pull-up) who is being seen today for the evaluation of troponin of 25 at the request of Dr. Carles Collet  History of Present Illness:   He has prior history of admission 05/2018 for volume overload with echo showing  LVEF 40% with multiple WMA,grade II diastolic dyscuntion, apical hypokinesis,density attached to MV subvalvular apparatus. TEE showed ruptured mitral valve chordae tendinae. Ischemic testing was not pursued at that time due to AKI and dementia. Medical therapy has previously been limited by hyperkalemia and AKI. He presented to [facility] overnight with chest pain. He carries diagnosis of dementia but is actually able to provide a fairly good history. He says yesterday afternoon he developed chest pain that radiated to both arms and his back. This was associated with nausea/vomiting. He has also been coughing for a few days, nonproductive. Wife (on phone) states she gave him ASA and Alka Seltzer but pain persisted so they came to [facility]. She is at home unable to visit because she takes care of their disabled son. He has been hypertensive during admission (initial BP 188/106) and was hypoxic in the 70s on presentation. CXR showed consolidation in the right mid and lower lung, less pronounced left perihilar airspace opacities, likely PNA, small bilateral effusions. Initial troponin was 1.22->1.18, rising to 26.89 this morning. Lactic acid was 2.2 with BNP  1647. He had mild leukocytosis of 11.1. Hgb was 9.8 yesterday, falling to 8.6 today with previous range in the 10's. He denies any known bleeding or dark stools recently. Pt was given IV Lasix 40mg  x 2, morphine, IV NTG, IV heparin, IV vanc/zosyn. He is chest pain free at this time but continues to feel "lousy" and reports bilateral flank pain with some component of back pain, more pronounced on the left, and diffuse lower abdominal pain with tenderness. He knows where he is, his name, living situation, address, family history (brother with PPM) but does not know the date. He is currently lying flat in bed without dyspnea. BP remains 140s/90s, HR 80s, pulse ox wnl on [place]. No diarrhea or recent bowel changes. EKG with sinus tach or ectopic atrial tach 108bpm IVCD with LBBB morphology - inferior Q waves noted inferiorly with subtle ST elevation III, V3-V4, TWI I, II, avL, V5-V6. F/u EKG appears similar except inferior TWI has resolved.   Past Medical History:  Diagnosis Date  . Anxiety   . Cardiomyopathy ([place])   . Chronic combined systolic and diastolic CHF (congestive heart failure) ([place])   . Dementia ([place])   . GERD (gastroesophageal reflux disease)   . Hyperlipidemia   . Hypertension   . Incontinence   . 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  . Myocardial infarction ([place])   . Neuromuscular disorder ([place])   . Parkinson's disease ([place])   . Peripheral vascular disease [facility])     Past Surgical History:  Procedure Laterality Date  . BLADDER REPAIR     pt sts "when I had my prostatectomy they cut too much and  I have an internal button I have to press in order to release my urine"  . BLADDER SURGERY     2010  pump placed   . CATARACT EXTRACTION W/PHACO  07/28/2011   Procedure: CATARACT EXTRACTION PHACO AND INTRAOCULAR LENS PLACEMENT (IOC);  Surgeon: Elta Guadeloupe T. Gershon Crane;  Location: [facility];  Service: Ophthalmology;  Laterality: Right;  CDE: 20.26  . CATARACT EXTRACTION W/PHACO   09/08/2011   Procedure: CATARACT EXTRACTION PHACO AND INTRAOCULAR LENS PLACEMENT (IOC);  Surgeon: Elta Guadeloupe T. Gershon Crane;  Location: [facility];  Service: Ophthalmology;  Laterality: Left;  CDE:37.31  . ENDARTERECTOMY Right 10/07/2016   Procedure: RIGHT ENDARTERECTOMY CAROTID WITH LIGATION OF INTERNAL CAROTID;  Surgeon: Serafina Mitchell, MD;  Location: [place];  Service: Vascular;  Laterality: Right;  . PATCH ANGIOPLASTY Right 10/07/2016   Procedure: [place];  Surgeon: Serafina Mitchell, MD;  Location: [place];  Service: Vascular;  Laterality: Right;  . PROSTATECTOMY    . TEE WITHOUT CARDIOVERSION N/A 06/10/2018   Procedure: TRANSESOPHAGEAL ECHOCARDIOGRAM (TEE);  Surgeon: Arnoldo Lenis, MD;  [facility];  Service: Endoscopy;  Laterality: N/A;  . YAG LASER APPLICATION  60/09/9322   Procedure: YAG LASER APPLICATION;  Surgeon: Elta Guadeloupe T. Gershon Crane, MD;  Location: [facility];  Service: Ophthalmology;  Laterality: Right;     Inpatient Medications: Scheduled Meds: . aspirin EC  81 mg Oral Daily  . atorvastatin  40 mg Oral q1800  . carvedilol  12.5 mg Oral BID WC  . furosemide      . sodium chloride flush  3 mL Intravenous Q12H   Continuous Infusions: . sodium chloride    . heparin 850 Units/hr (02/10/19 0739)  . nitroGLYCERIN    . piperacillin-tazobactam (ZOSYN)  IV 3.375 g (02/10/19 0828)  . vancomycin     PRN Meds: sodium chloride, sodium chloride flush  Home Meds: Prior to Admission medications   Medication Sig Start Date End Date Taking? Authorizing Provider  acetaminophen (TYLENOL) 650 MG CR tablet Take 650-1,300 mg by mouth daily as needed for pain.     [provider]  aspirin EC 81 MG tablet Take 81 mg by mouth daily.    [provider]  carbidopa-levodopa (SINEMET) 25-100 MG per tablet Take 1 tablet by mouth 2 (two) times daily.     [provider]  carvedilol (COREG) 12.5 MG tablet Take 1 tablet (12.5 mg total) by mouth 2  (two) times daily with a meal. 08/01/18   Branch, Alphonse Guild, MD  DULoxetine (CYMBALTA) 60 MG capsule Take 60 mg by mouth at bedtime.     [provider]  folic acid (FOLVITE) 1 MG tablet Take 1 mg by mouth at bedtime.     [provider]  hydrOXYzine (ATARAX/VISTARIL) 25 MG tablet Take 25 mg by mouth at bedtime.    [provider]  levothyroxine (SYNTHROID, LEVOTHROID) 88 MCG tablet Take 88 mcg by mouth daily before breakfast.    [provider]  meloxicam (MOBIC) 15 MG tablet Take 1 tablet by mouth daily. 10/23/18   [provider]  Multiple Vitamins-Minerals (MULTIVITAMIN WITH MINERALS) tablet Take 1 tablet by mouth every morning.     [provider]  oxybutynin (DITROPAN) 5 MG tablet Take 5 mg by mouth 2 (two) times daily. 05/07/15   [provider]  rasagiline (AZILECT) 1 MG TABS tablet Take 1 tablet by mouth daily. 11/21/18   [provider]  simvastatin (ZOCOR) 10 MG tablet  Take 10 mg by mouth at bedtime.      [provider]  sodium polystyrene (KAYEXALATE) 15 GM/60ML suspension Take 120 mLs (30 g total) by mouth as directed. 30 g by mouth today and tomorrow (2 doses) 11/23/18   Branch, Alphonse Guild, MD    Allergies:   No Known Allergies  Social History:   Social History   Socioeconomic History  . Marital status: Married    Spouse name: Not on file  . Number of children: Not on file  . Years of education: Not on file  . Highest education level: Not on file  Occupational History  . Not on file  Social Needs  . Financial resource strain: Not on file  . Food insecurity:    Worry: Not on file    Inability: Not on file  . Transportation needs:    Medical: Not on file    Non-medical: Not on file  Tobacco Use  . Smoking status: Former Smoker    Packs/day: 0.50    Years: 10.00    Pack years: 5.00    Types: Cigarettes    Last attempt to quit: 07/24/1983    Years since quitting: 35.5  . Smokeless  tobacco: Never Used  Substance and Sexual Activity  . Alcohol use: No  . Drug use: No  . Sexual activity: Never  Lifestyle  . Physical activity:    Days per week: Not on file    Minutes per session: Not on file  . Stress: Not on file  Relationships  . Social connections:    Talks on phone: Not on file    Gets together: Not on file    Attends religious service: Not on file    Active member of club or organization: Not on file    Attends meetings of clubs or organizations: Not on file    Relationship status: Not on file  . Intimate partner violence:    Fear of current or ex partner: Not on file    Emotionally abused: Not on file    Physically abused: Not on file    Forced sexual activity: Not on file  Other Topics Concern  . Not on file  Social History Narrative  . Not on file    Family History:   The patient's family history includes Cancer in his father and mother; Heart Problems in his brother. There is no history of Anesthesia problems, Hypotension, Malignant hyperthermia, or Pseudochol deficiency. ROS:  Please see the history of present illness.  all other ROS reviewed and negative.     Physical Exam/Data:   Vitals:   02/10/19 0710 02/10/19 0720 02/10/19 0730 02/10/19 0800  BP: (!) 144/84 (!) 155/97 (!) 146/89 (!) 158/89  Pulse: 91 91 86 88  Resp: 18 (!) 24 18 (!) 22  Temp:      TempSrc:      SpO2:      Weight:      Height:        Intake/Output Summary (Last 24 hours) at 02/10/2019 0857 Last data filed at 02/10/2019 0732 Gross per 24 hour  Intake 389.14 ml  Output 1000 ml  Net -610.86 ml   Last 3 Weights 02/09/2019 11/23/2018 09/19/2018  Weight (lbs) 160 lb 187 lb 9.6 oz 184 lb 12.8 oz  Weight (kg) 72.576 kg 85.095 kg 83.825 kg    Body mass index is 24.33 kg/m.  General: Well developed, well nourished, in no acute distress. Head: Normocephalic, atraumatic, sclera non-icteric, no xanthomas,  nares are without discharge.  Neck: Negative for carotid bruits.  JVD not elevated. Lungs: Clear bilaterally to auscultation without wheezes, rales, or rhonchi. Breathing is unlabored. Heart: RRR with S1 S2. No murmurs, rubs, or gallops appreciated. Abdomen: Soft, non-tender, non-distended with normoactive bowel sounds. No hepatomegaly. No rebound/guarding. No obvious abdominal masses. Msk:  Strength and tone appear normal for age. Extremities: No clubbing or cyanosis. No edema.  Distal pedal pulses are 2+ and equal bilaterally. Neuro: Alert and oriented X 3. No facial asymmetry. No focal deficit. Moves all extremities spontaneously. Psych:  Responds to questions appropriately with a normal affect.  EKG:  The EKG was personally reviewed and demonstrates sinus tach or ectopic atrial tach 108bpm IVCD with LBBB morphology - inferior Q waves noted inferiorly with subtle ST elevation III, TWI I, II, avL, V5-V6 F/u EKG appears slightly improved but still most definitely changed from prior.  Laboratory Data:  Chemistry Recent Labs  Lab 02/09/19 2100 02/10/19 0547  NA 137 137  K 4.4 4.1  CL 105 103  CO2 21* 24  GLUCOSE 138* 146*  BUN 44* 43*  CREATININE 1.44* 1.44*  CALCIUM 9.1 8.8*  GFRNONAA 47* 47*  GFRAA 54* 54*  ANIONGAP 11 10    No results for input(s): PROT, ALBUMIN, AST, ALT, ALKPHOS, BILITOT in the last 168 hours. Hematology Recent Labs  Lab 02/09/19 2100 02/10/19 0547  WBC 11.1* 8.2  RBC 3.11* 2.75*  HGB 9.8* 8.6*  HCT 30.8* 27.1*  MCV 99.0 98.5  MCH 31.5 31.3  MCHC 31.8 31.7  RDW 13.6 13.3  PLT 220 180   Cardiac Enzymes Recent Labs  Lab 02/09/19 2100 02/09/19 2332 02/10/19 0547  TROPONINI 1.22* 1.18* 25.89*   No results for input(s): TROPIPOC in the last 168 hours.  BNP Recent Labs  Lab 02/09/19 2100  BNP 1,647.0*    DDimer No results for input(s): DDIMER in the last 168 hours.  Radiology/Studies:  Dg Chest Portable 1 View  Result Date: 02/09/2019 CLINICAL DATA:  Chest pain, nausea, vomiting EXAM: PORTABLE CHEST 1  VIEW COMPARISON:  06/08/2018 FINDINGS: Cardiomegaly. Dense consolidation in the right mid and lower lung. Mild left perihilar airspace opacities. Findings most likely reflect pneumonia. Small effusions bilaterally. No acute bony abnormality. IMPRESSION: Consolidation in the right mid and lower lung. Less pronounced left perihilar airspace opacities. Findings likely reflect pneumonia. Small bilateral effusions. Electronically Signed   By: Rolm Baptise M.D.   On: 02/09/2019 21:26    Assessment and Plan:   1. Chest pain with elevated troponin/EKG concerning for inferior MI, but also complaints of radiation to back with abdominal pain - very difficult situation. Ischemic eval was previously deferred due to dementia/AKI. Despite diagnosis of dementia pt is rather oriented and able to provide decent history. There do appear to be lapses in memory but this does not seem to be prohibitive of acute evaluation. However, he is experiencing diffuse abd pain with drop in Hgb - given recent CP with radiation to back, Dr. Domenic Polite and I feel he requires r/o of dissection. If this is unremarkable for any other obvious pathology will likely require transfer to [facility] for cardiac catheterization - timing will need to be determined based on kidney function, CT findings and ongoing symptoms. With CKD and Lasix administration with need for CT will be at risk for worsening renal function. Risks and benefits of cardiac catheterization have been discussed with the patient. These include bleeding, infection, kidney damage, stroke, heart attack, death. The patient understands  these risks and is willing to proceed. He is no longer having chest pain currently.  2. Acute on chronic anemia - pt denies any obvious bleeding. Hemoccult o ordered. Add anemia panel to next labs. Follow closely on ASA, heparin.  3. HTN - continue IV NTG, carvedilol for now - follow up CT result.  4. Hyperlipidemia - simvastatin on hold. Check LFTs. Will  tentatively switch to Lipitor 40mg . Will need OP f/u of labs as well.  5. Acute hypoxic respiratory failure with possible PNA on CT, also probable component of acute on chronic combined CHF - hold off further Lasix given need for contrast. Lying flat without dyspnea, minimal edema on exam. CT will help elucidate pulmonary findings.  5. CKD stage III - Cr appears near baseline. He is at risk for worsening renal function this admission based on probable MI, CHF requiring Lasix administration and need for contrast load. He was on meloxicam at home which should be discontinued.  6. Parkinson's/dementia - will defer resumption of home meds to IM.  For questions or updates, please contact [place] Please consult [email or web address] for contact info under Cardiology/STEMI.    Signed, Charlie Pitter, PA-C  02/10/2019 8:57 AM    Attending note:  Patient seen and examined.  Extensive records were reviewed and the case was discussed with Ms. Idolina Primer PA-C and also Dr. Carles Collet on the hospitalist team.  Difficult situation overall.  Patient is a 78 year old male with baseline dementia, lives at home with his wife.  Cardiac history is detailed above and includes chronic systolic heart failure with an LVEF of 40%, known mitral valve ruptured chordae tendinae with mild mitral regurgitation.  He has not undergone previous invasive cardiac testing in the setting of renal insufficiency with previously stable symptoms, also in the setting of dementia.  He follows with Dr. Harl Bowie.  He presented to the ER complaining of recent onset chest pain with radiation to both arms and his upper back.  This was followed by nausea and emesis.  He had apparently been having some cough prior to this, but no fevers or chills.  He was markedly hypertensive on presentation to the ER and was found to have abnormal troponin of 1.22, also lactic axis of 2.2.  He is chronically anemic although hemoglobin somewhat lower at 9.8-8.6.  Case was  apparently discussed with on-call cardiology overnight based on review of the chart and it was recommended that the patient be evaluated by cardiology today at [facility].  Patient was seen by Dr. Carles Collet for admission to the hospitalist team.  Subsequent troponin I level increased to 26.89 as of this morning.  Patient had been treated with Lasix with finding of pleural effusions by chest x-ray, also given morphine, nitroglycerin, and heparin.  He was placed on empiric antibiotics.  On our assessment this morning, patient stated that his chest pain had resolved, he still had some discomfort in his arms and upper back, also complaining of lower abdominal pain and left flank pain.  Follow-up ECG showed sinus rhythm with possible old inferolateral infarct pattern and fairly diffuse but nonspecific ST segment changes in the setting of IVCD.  Given his presentation and some difficulty in getting a complete picture of his symptoms in the setting of what looks to be moderate dementia, as well as laboratory abnormalities and his complaints as of our initial assessment, decision made to proceed with CT imaging of the chest and abdomen to exclude other acute vascular catastrophe or acute abdominal process  prior to considering him a candidate for invasive cardiac catheterization.  CT imaging revealed aneurysmal disease of the mid descending thoracic aorta at 4.2 to 4.3 cm with posterior wall ulcerated plaque and mural thrombus but no evidence of dissection or intramural hemorrhage to suggest acute process.  He also had pulmonary edema and small bilateral pleural effusions.  Coronary atherosclerosis described in the LAD and RCA distribution.  Also granulomatous disease and mediastinal or/bilateral hilar lymph nodes.  Situation discussed subsequently with the hospitalist team and it is recommended that the patient be transferred on their service to [facility] for further evaluation.  He is currently on IV heparin  and nitroglycerin along with aspirin, Coreg, and Lipitor.  From a cardiac perspective he will need to undergo cardiac catheterization to assess coronary anatomy, does have risk for contrast nephropathy in light of recent CT procedure and baseline CKD with current creatinine 1.44.  Of note, follow-up troponin I up to 37.67.  Gentle hydration is being initiated and he will be kept n.p.o.  Satira Sark, M.D., F.A.C.C.

## 2019-02-10 NOTE — ED Notes (Signed)
Hospitalist at bedside . Aware of troponin

## 2019-02-10 NOTE — Progress Notes (Signed)
PROGRESS NOTE  [name] YPP:[id number] DOB: [birth year] DOA: 02/09/2019 PCP: Celene Squibb, MD  Brief History:  78 year old male with a history of coronary disease, hyperlipidemia, hypertension, dementia, CKD stage III, and chronic systolic and diastolic CHF presenting with chest pain, shortness breath, and nausea and vomiting.  Unfortunately, the history is limited secondary to the patient's dementia.  He states that he has had some chest discomfort and shortness of breath for 3 to 4 days.  He is not able to clarify any exacerbating or alleviating factors.  He stated that he had a few episodes of nausea and vomiting on the day of admission.  Upon presentation, the patient was noted to have oxygen saturation of 77% on room air.  BMP was essentially unremarkable with a serum creatinine at his usual baseline of 1.44.  WBC was 11.1.  BNP was 1647.  Chest x-ray showed right lower lobe and right middle lobe infiltrate with small bilateral pleural effusions.  Troponin peaked at 25.89.  The patient was initially noted to have blood pressure of 188/106.  He was started on nitroglycerin drip.  Once his troponin returned, the patient was started on a heparin drip.  Cardiology was consulted to assist with management.  Assessment/Plan: Acute respiratory failure -Secondary to CHF and pneumonia -Presently stable on 2 L nasal cannula -Wean oxygen for saturation greater 92%  Lobar pneumonia/community-acquired pneumonia -Check procalcitonin -Urine Legionella antigen -Urine Streptococcus pneumoniae antigen -Start ceftriaxone and azithromycin  Elevated Troponin/NSTEMI -Wean IV nitroglycerin -Start IV heparin -Cardiology consult -Echocardiogram -10/12/2018 echo EF 45-50%, grade 1 DD, significant WMA -Patient has history of ruptured chordae tendon a with mild MR/TR  Acute on Chronic systolic and diastolic CHF -continue lasix 40 mg IV daily -repeat echo -continue coreg -daily  weights -accurate I/Os  CKD stage III -Baseline creatinine 1.4-1.7 -A.m. BMP  Parkinson's disease with cognitive impairment -Continue Sinemet and rasagiline  Essential hypertension -Continue carvedilol  Hyperlipidemia -Continue statin       Disposition Plan:   Not stable for d/c Family Communication:  No Family at bedside  Consultants:  cardiology  Code Status:  FULL   DVT Prophylaxis:  IV Heparin    Procedures: As Listed in Progress Note Above  Antibiotics: vanc 2/27 Zosyn 2/27       Subjective: Pt c/o bilateral rib pain.  Denies f/c, headache, n/v/d, abd pain.  No dysuria  Objective: Vitals:   02/10/19 0630 02/10/19 0640 02/10/19 0650 02/10/19 0700  BP: 136/89 138/82 (!) 146/91 (!) 141/91  Pulse: 90 87 88 86  Resp: 20 20 18 19   Temp:      TempSrc:      SpO2: 99% 99% 99% 99%  Weight:      Height:        Intake/Output Summary (Last 24 hours) at 02/10/2019 0732 Last data filed at 02/10/2019 0445 Gross per 24 hour  Intake 229.15 ml  Output 1000 ml  Net -770.85 ml   Weight change:  Exam:   General:  Pt is alert, follows commands appropriately, not in acute distress  HEENT: No icterus, No thrush, No neck mass, [place]/AT  Cardiovascular: RRR, S1/S2, no rubs, no gallops  Respiratory:bibasilar crackles, no wheeze  Abdomen: Soft/+BS, non tender, non distended, no guarding  Extremities: 1 + LE edema, No lymphangitis, No petechiae, No rashes, no synovitis   Data Reviewed: I have personally reviewed following labs and imaging studies Basic Metabolic Panel: Recent Labs  Lab 02/09/19  2100 02/10/19 0547  NA 137 137  K 4.4 4.1  CL 105 103  CO2 21* 24  GLUCOSE 138* 146*  BUN 44* 43*  CREATININE 1.44* 1.44*  CALCIUM 9.1 8.8*   Liver Function Tests: No results for input(s): AST, ALT, ALKPHOS, BILITOT, PROT, ALBUMIN in the last 168 hours. No results for input(s): LIPASE, AMYLASE in the last 168 hours. No results for input(s): AMMONIA in the  last 168 hours. Coagulation Profile: No results for input(s): INR, PROTIME in the last 168 hours. CBC: Recent Labs  Lab 02/09/19 2100 02/10/19 0547  WBC 11.1* 8.2  HGB 9.8* 8.6*  HCT 30.8* 27.1*  MCV 99.0 98.5  PLT 220 180   Cardiac Enzymes: Recent Labs  Lab 02/09/19 2100 02/09/19 2332 02/10/19 0547  TROPONINI 1.22* 1.18* 25.89*   BNP: Invalid input(s): POCBNP CBG: No results for input(s): GLUCAP in the last 168 hours. HbA1C: No results for input(s): HGBA1C in the last 72 hours. Urine analysis:    Component Value Date/Time   COLORURINE STRAW (A) 02/09/2019 2123   APPEARANCEUR CLEAR 02/09/2019 2123   LABSPEC 1.008 02/09/2019 2123   PHURINE 6.0 02/09/2019 2123   GLUCOSEU NEGATIVE 02/09/2019 2123   HGBUR SMALL (A) 02/09/2019 2123   BILIRUBINUR NEGATIVE 02/09/2019 2123   [place] 02/09/2019 2123   PROTEINUR 30 (A) 02/09/2019 2123   NITRITE NEGATIVE 02/09/2019 2123   LEUKOCYTESUR NEGATIVE 02/09/2019 2123   Sepsis Labs: @LABRCNTIP (procalcitonin:4,lacticidven:4) ) Recent Results (from the past 240 hour(s))  Blood Culture (routine x 2)     Status: None (Preliminary result)   Collection Time: 02/09/19  9:34 PM  Result Value Ref Range Status   Specimen Description LEFT ANTECUBITAL  Final   Special Requests BOTTLES DRAWN AEROBIC AND ANAEROBIC  Final   Culture   Final    NO GROWTH < 12 HOURS Performed at [facility], [address] [place], [place] [address]    Report Status PENDING  Incomplete  Blood Culture (routine x 2)     Status: None (Preliminary result)   Collection Time: 02/09/19  9:36 PM  Result Value Ref Range Status   Specimen Description BLOOD LEFT HAND  Final   Special Requests   Final    BOTTLES DRAWN AEROBIC AND ANAEROBIC Blood Culture adequate volume   Culture   Final    NO GROWTH < 12 HOURS Performed at [facility], [address], [place], [place] [address]    Report Status PENDING  Incomplete     Scheduled Meds: . aspirin EC  81  mg Oral Daily  . carvedilol  12.5 mg Oral BID WC  . furosemide      . furosemide  40 mg Intravenous Q12H  . heparin  4,000 Units Intravenous Once  . sodium chloride flush  3 mL Intravenous Q12H   Continuous Infusions: . sodium chloride    . heparin    . nitroGLYCERIN 90 mcg/min (02/10/19 0630)  . nitroGLYCERIN      Procedures/Studies: Dg Chest Portable 1 View  Result Date: 02/09/2019 CLINICAL DATA:  Chest pain, nausea, vomiting EXAM: PORTABLE CHEST 1 VIEW COMPARISON:  06/08/2018 FINDINGS: Cardiomegaly. Dense consolidation in the right mid and lower lung. Mild left perihilar airspace opacities. Findings most likely reflect pneumonia. Small effusions bilaterally. No acute bony abnormality. IMPRESSION: Consolidation in the right mid and lower lung. Less pronounced left perihilar airspace opacities. Findings likely reflect pneumonia. Small bilateral effusions. Electronically Signed   By: Rolm Baptise M.D.   On: 02/09/2019 21:26  Orson Eva, DO  [facility] Pager [phone]  If 7PM-7AM, please contact night-coverage [email or web address] Password [facility] 02/10/2019, 7:32 AM   LOS: 0 days

## 2019-02-10 NOTE — Progress Notes (Signed)
ANTICOAGULATION CONSULT NOTE - Preliminary  Pharmacy Consult for heparin Indication: chest pain/ACS  No Known Allergies  Patient Measurements: Height: 5\' 8"  (172.7 cm) Weight: 160 lb (72.6 kg) IBW/kg (Calculated) : 68.4 HEPARIN DW (KG): 72.6   Vital Signs: Temp: 98.8 F (37.1 C) (02/27 2123) Temp Source: Rectal (02/27 2123) BP: 141/91 (02/28 0700) Pulse Rate: 86 (02/28 0700)  Labs: Recent Labs    02/09/19 2100 02/09/19 2332 02/10/19 0547  HGB 9.8*  --  8.6*  HCT 30.8*  --  27.1*  PLT 220  --  180  CREATININE 1.44*  --  1.44*  TROPONINI 1.22* 1.18* 25.89*   Estimated Creatinine Clearance: 42.2 mL/min (A) (by C-G formula based on SCr of 1.44 mg/dL (H)).  Medical History: Past Medical History:  Diagnosis Date  . Anxiety   . GERD (gastroesophageal reflux disease)   . Hyperlipidemia   . Hypertension   . Myocardial infarction ([place])   . Neuromuscular disorder ([place])   . Parkinson's disease ([place])   . Peripheral vascular disease ([facility])     Medications:  Scheduled:  . aspirin EC  81 mg Oral Daily  . carvedilol  12.5 mg Oral BID WC  . furosemide      . furosemide  40 mg Intravenous Q12H  . heparin  4,000 Units Intravenous Once  . sodium chloride flush  3 mL Intravenous Q12H   Infusions:  . sodium chloride    . heparin    . nitroGLYCERIN 90 mcg/min (02/10/19 0732)  . nitroGLYCERIN     Anti-infectives (From admission, onward)   Start     Dose/Rate Route Frequency Ordered Stop   02/09/19 2130  vancomycin (VANCOCIN) IVPB 1000 mg/200 mL premix     1,000 mg 200 mL/hr over 60 Minutes Intravenous  Once 02/09/19 2123 02/09/19 2240   02/09/19 2130  piperacillin-tazobactam (ZOSYN) IVPB 3.375 g     3.375 g 100 mL/hr over 30 Minutes Intravenous  Once 02/09/19 2123 02/09/19 2218      Assessment: Asked to dose heparin for 78 year old male with elevated troponin.    Goal of Therapy:  Heparin level 0.3-0.7 units/ml   Plan:  Give 4000 units bolus x 1 Start heparin  infusion at 850 units/hr Check anti-Xa level in 6 hours and daily while on heparin Continue to monitor H&H and platelets Preliminary review of pertinent patient information completed.  Forestine Na clinical pharmacist will complete review during morning rounds to assess the patient and finalize treatment regimen.  Nyra Capes, RPH 02/10/2019,7:25 AM

## 2019-02-10 NOTE — Progress Notes (Signed)
CTA result reviewed with Dr. Domenic Polite: + aneurysmal disease in the thoracic aorta but no evidence of acute dissection or intramural hemorrhage. This will require annual follow-up. The scan also shows possible pulmonary edema and small bilateral effusions. There is cardiac enlargement and CAD. I reviewed with patient who states his abdominal/flank pain is improved and he is currently feeling better. No chest pain. His belly is no longer tender. He continues to be able to lie back without any dyspnea. Troponin has risen further to 37. Per discussion with Dr. Domenic Polite, patient will require cardiac cath this afternoon. I reviewed at length with patient who (despite dx of dementia) seems to have good grasp on what this entails. I also called his wife [name] to discuss the plan of care and she would like him to go forward as well. With both the patient and [name] I discussed the risk of worsened kidney injury and what this would entail, with possibility of requiring dialysis. We are hopeful this can be managed and they both understand he is in a difficult situation. That being said, the patient is eager to proceed with cath and his wife is also in agreement that he should proceed. We will have him on the schedule this afternoon to allow for gentle hydration at 50cc/hr - cannot hydrate more aggressively given CT evidence for pulm edema. Spoke with Wannetta Sender who will put him on the cath board. Spoke with Dr. Carles Collet who will help facilitate transfer as he is to remain on the hospitalist service with cardiology consulting. Jaceon Heiberger PA-C

## 2019-02-10 NOTE — ED Notes (Signed)
Date and time results received: 02/10/19 06:35 (use smartphrase ".now" to insert current time)  Test: troponin  Critical Value: 25.89  Name of Provider Notified: Shanon Brow MD  Orders Received? Or Actions Taken?: provider notified

## 2019-02-10 NOTE — ED Notes (Signed)
Condom cath removed from patient. Condom cath would not stay on patient . Patient's bed linen and gown changed x 2. Patient given warm blankets and brief placed on patient.

## 2019-02-10 NOTE — Progress Notes (Signed)
Patient's troponin peaked 37.67.  Cardiology was consulted.  Case was discussed with cardiology.  They requested a CTA of the chest abdomen and pelvis to rule out dissection.  This was ordered and showed a descending aortic aneurysm 4.2-4.3 cm with associated mural thrombus.  It was recommended to repeat CT angiogram in 1 year.  There is no dissection or intramural hemorrhage.  There was pulmonary edema, right greater than left.  There was groundglass opacities predominantly in bilateral right greater than left upper lobes.  The patient was continued on ceftriaxone and azithromycin.  Furosemide was discontinued in anticipation for cardiac catheterization.  The patient will be transferred to [facility] for cardiac catheterization with cardiology following.  DTat

## 2019-02-10 NOTE — ED Notes (Signed)
Date and time results received: 02/10/19 9:23 AM   Test: trop Critical Value: 37.67  Name of Provider Notified: Tat  Orders Received? Or Actions Taken?:

## 2019-02-11 DIAGNOSIS — I5023 Acute on chronic systolic (congestive) heart failure: Secondary | ICD-10-CM

## 2019-02-11 DIAGNOSIS — I2583 Coronary atherosclerosis due to lipid rich plaque: Secondary | ICD-10-CM

## 2019-02-11 DIAGNOSIS — I5043 Acute on chronic combined systolic (congestive) and diastolic (congestive) heart failure: Secondary | ICD-10-CM

## 2019-02-11 DIAGNOSIS — R079 Chest pain, unspecified: Secondary | ICD-10-CM

## 2019-02-11 LAB — COMPREHENSIVE METABOLIC PANEL
ALK PHOS: 59 U/L (ref 38–126)
ALT: 41 U/L (ref 0–44)
AST: 91 U/L — ABNORMAL HIGH (ref 15–41)
Albumin: 3 g/dL — ABNORMAL LOW (ref 3.5–5.0)
Anion gap: 10 (ref 5–15)
BUN: 40 mg/dL — ABNORMAL HIGH (ref 8–23)
CO2: 23 mmol/L (ref 22–32)
Calcium: 8.5 mg/dL — ABNORMAL LOW (ref 8.9–10.3)
Chloride: 104 mmol/L (ref 98–111)
Creatinine, Ser: 1.45 mg/dL — ABNORMAL HIGH (ref 0.61–1.24)
GFR calc Af Amer: 54 mL/min — ABNORMAL LOW (ref >60)
GFR calc non Af Amer: 46 mL/min — ABNORMAL LOW (ref >60)
Glucose, Bld: 120 mg/dL — ABNORMAL HIGH (ref 70–99)
Potassium: 4.3 mmol/L (ref 3.5–5.1)
Sodium: 137 mmol/L (ref 135–145)
Total Bilirubin: 0.5 mg/dL (ref 0.3–1.2)
Total Protein: 6.7 g/dL (ref 6.5–8.1)

## 2019-02-11 LAB — CBC
HCT: 23.1 % — ABNORMAL LOW (ref 39.0–52.0)
Hemoglobin: 7.6 g/dL — ABNORMAL LOW (ref 13.0–17.0)
MCH: 31.5 pg (ref 26.0–34.0)
MCHC: 32.9 g/dL (ref 30.0–36.0)
MCV: 95.9 fL (ref 80.0–100.0)
Platelets: 191 10*3/uL (ref 150–400)
RBC: 2.41 MIL/uL — ABNORMAL LOW (ref 4.22–5.81)
RDW: 13.1 % (ref 11.5–15.5)
WBC: 7.9 10*3/uL (ref 4.0–10.5)
nRBC: 0 % (ref 0.0–0.2)

## 2019-02-11 LAB — LIPID PANEL
Cholesterol: 125 mg/dL (ref 0–200)
HDL: 26 mg/dL — ABNORMAL LOW (ref >40)
LDL Cholesterol: 86 mg/dL (ref 0–99)
Total CHOL/HDL Ratio: 4.8 RATIO
Triglycerides: 65 mg/dL (ref <150)
VLDL: 13 mg/dL (ref 0–40)

## 2019-02-11 LAB — HEPARIN LEVEL (UNFRACTIONATED)
HEPARIN UNFRACTIONATED: 0.11 [IU]/mL — AB (ref 0.30–0.70)
Heparin Unfractionated: 0.15 IU/mL — ABNORMAL LOW (ref 0.30–0.70)

## 2019-02-11 LAB — PREPARE RBC (CROSSMATCH)

## 2019-02-11 LAB — PROCALCITONIN: Procalcitonin: 0.31 ng/mL

## 2019-02-11 MED ORDER — FUROSEMIDE 10 MG/ML IJ SOLN: 20.0000 mg | Freq: Once | INTRAMUSCULAR | Status: DC

## 2019-02-11 MED ORDER — RASAGILINE MESYLATE 1 MG PO TABS
1.0000 mg | ORAL_TABLET | Freq: Every day | ORAL | Status: DC
  Administered 2019-02-11 – 2019-02-14 (×4): 1 mg via ORAL
  Filled 2019-02-11 (×4): qty 1

## 2019-02-11 MED ORDER — CARVEDILOL 12.5 MG PO TABS
12.5000 mg | ORAL_TABLET | Freq: Once | ORAL | Status: DC
  Filled 2019-02-11: qty 1

## 2019-02-11 MED ORDER — FUROSEMIDE 10 MG/ML IJ SOLN: 40.0000 mg | Freq: Two times a day (BID) | INTRAMUSCULAR | Status: DC

## 2019-02-11 MED ORDER — CARBIDOPA-LEVODOPA 25-100 MG PO TABS
1.0000 | ORAL_TABLET | Freq: Two times a day (BID) | ORAL | Status: DC
  Administered 2019-02-11 – 2019-02-14 (×7): 1 via ORAL
  Filled 2019-02-11 (×8): qty 1

## 2019-02-11 MED ORDER — ISOSORBIDE MONONITRATE ER 30 MG PO TB24
30.0000 mg | ORAL_TABLET | Freq: Every day | ORAL | Status: DC
  Administered 2019-02-12 – 2019-02-13 (×2): 30 mg via ORAL
  Filled 2019-02-11 (×2): qty 1

## 2019-02-11 MED ORDER — FUROSEMIDE 10 MG/ML IJ SOLN
40.0000 mg | Freq: Once | INTRAMUSCULAR | Status: AC
  Administered 2019-02-11: 40 mg via INTRAVENOUS
  Filled 2019-02-11: qty 4

## 2019-02-11 MED ORDER — LEVOTHYROXINE SODIUM 88 MCG PO TABS
88.0000 ug | ORAL_TABLET | Freq: Every day | ORAL | Status: DC
  Administered 2019-02-11 – 2019-02-14 (×4): 88 ug via ORAL
  Filled 2019-02-11 (×3): qty 1

## 2019-02-11 MED ORDER — CARVEDILOL 25 MG PO TABS
25.0000 mg | ORAL_TABLET | Freq: Two times a day (BID) | ORAL | Status: DC
  Administered 2019-02-11 – 2019-02-14 (×7): 25 mg via ORAL
  Filled 2019-02-11 (×6): qty 1

## 2019-02-11 MED ORDER — SODIUM CHLORIDE 0.9% IV SOLUTION: Freq: Once | INTRAVENOUS | Status: DC

## 2019-02-11 NOTE — Progress Notes (Signed)
PROGRESS NOTE    [name]  FTD:[id number] DOB: May 14, 1941 DOA: 02/09/2019 PCP: Celene Squibb, MD      Brief Narrative:  [name] is a 78 y.o. M with dementia, community dwelling, Parkinsons, HTN, CAD, PVD with CEA 2017, sCHF EF 40%, hx CVA no residual weakness, CKD III baseline 1.4, and anemia of chronic renal disease who presents with chest pain and few days cough.  In ER on arrival was hypoxic to 70s, CXR showed consolidation.  Lactic acid 2.2, BNP 1600, troponin 1.2 initially.  Admitted to [facility], started on empiric Abx for CAP, trend troponin, Lasix for CHF, IV heparin.  Troponin up to 25, transferred to [facility] for cath.        Assessment & Plan:  NSTEMI -Continue aspirin, atorvastatin, carvedilol -Continue heparin -Plavix per Cardiology   Community Acquired pneumonia Acute hypoxic respiartory failure from CAP Blood cultures negative.  HR >90, off O2, taking orals.   -Continue ceftriaxone and azithromycin  Acute on chronic systolic and DIastolic CHF No orthopnea, dyspnea at present.  No swelling.   -Obtain echocardiogram -1 dose IV lasix after transfusion  Dementia -Hold home donepezil for now  Parkinsons -Continue Sinemet and rasagaline  Acute on chronic anemia of renal insufficiency Unclear drop in Hgb here, likely dilutional, phlebotomy, no clinical bleeding noted.  Baseline Hgb 10 g/dL.  Iron studies show AoCD and of renal insuffiency. -Transfuse 1 unit now -Transfusion threshold 8 g/dL given active coronary disease -Lasix 20 mg IV with trasnfusion  CKD III Baseline Cr 1.6-1.8.  1.5 here. -Trend Cr  Hypertension BP elevated -Continue carvedilol  Hypothyroidism -Restart levothyroxine -Check TSH  Other medications -Hold duloxetine, ditropan       MDM and disposition: The below labs and imaging reports were reviewed and summarized above.  Medication management as above.  The patient was admitted with pneumonia, found to have nstemi.  Currently  on heparin. There are no revascularization options.  WIll optimize medically for NSTEMI and continue heparin, continue treatment of pneumonia.         Acute coronary syndrome (MI, NSTEMI, STEMI, etc) this admission?: Yes.     AHA/ACC Clinical Performance & Quality Measures: 1. Aspirin prescribed? - Yes 2. ADP Receptor Inhibitor (Plavix/Clopidogrel, Brilinta/Ticagrelor or Effient/Prasugrel) prescribed (includes medically managed patients)? - No - pending cardiology, concern for anemia 3. Beta Blocker prescribed? - Yes 4. High Intensity Statin (Lipitor 40-80mg  or Crestor 20-40mg ) prescribed? - Yes 5. EF assessed during THIS hospitalization? - Yes 6. For EF <40%, was ACEI/ARB prescribed? - pending 7. For EF <40%, Aldosterone Antagonist (Spironolactone or Eplerenone) prescribed? - pending 8. Cardiac Rehab Phase II ordered (Included Medically managed Patients)? - pending        DVT prophylaxis: N/A on heparin Code Status: UFLL Family Communication: None presnt    Consultants:   Hospitalist  Procedures:   Catheterization  RCA Prox RCA lesion is 50% stenosed. Prox RCA to Mid RCA lesion is 80% stenosed. Mid RCA lesion is 90% stenosed. Mid RCA to Dist RCA lesion is 100% stenosed.  ----- Part of the PDA and PL system was filled via right to right and left-to-right collaterals.  Cx-OM: Ost Cx to Prox Cx lesion is 45% stenosed with 90% stenosed side branch in LAV Groove.  ------ 3rd LPL-1 lesion is 80% stenosed. 3rd LPL-2 lesion is 99% stenosed.  Prox LAD to Mid LAD lesion is 90% stenosed with 55% stenosed side branch in Ost 2nd Diag.  Mid-distal LAD to Dist LAD lesion is  95% stenosed. Dist LAD lesion is 90% stenosed.  --  LV end diastolic pressure is moderately elevated. Consistent with acute on chronic diastolic heart failure   SUMMARY  Severe multivessel disease: 100% CTO of mid RCA, 50% proximal circumflex with 90% AV groove followed by 100% CTO of LPL (that recanalizes  through left left lateral), 90 to 95% bifurcation LAD-2nd Diag followed by 60% and then tandem 99 to 90% lesions in the mid to distal LAD. -- >  Minimal good PCI or CABG options  Moderately elevated LVEDP consistent with acute diastolic heart failure  RECOMMENDATIONS  The patient will be transferred to a telemetry bed.  I plan to restart IV heparin to run for 48 hours for MI.  (Will restart 8 hours post sheath removal)  Continue aggressive medical management.  I discussed the findings with the patient's power of attorney/brother and sister-in-law:  Options going forward would be medical management, CABG, random PCI.  After discussing with the brother: ? It is clear that PCI is not likely a good option as it would not be a good candidate for dual antiplatelet therapy with frequent falls, he is also was anemic.  ? CABG would not be something that they would be interested in offering based on his dementia, renal insufficiency and Parkinson's.  Would not likely recover.   ? Best option going forward would be medical management.   With renal insufficiency, we will hydrate him, but he would likely require diuresis and standing diuretic during this hospitalization.  2D echo was ordered to better assess his EF, however I suspect it to be reduced.  Antimicrobials:   Vancomycin 2/27 >> 2/28  Zosyn 2/27 >> 2/28  Ceftriaxone 2/28 >>  Azithromycin 2/28 >>         Subjective: No more chest pain.  Has pain in bilateral sides, not exertional, worse with lying on it.  No more fever, no sputum, mild cough only.  Complains of bilateral leg sewlling.  History very difficulty.  Objective: Vitals:   02/11/19 0004 02/11/19 0416 02/11/19 0525 02/11/19 0751  BP: 116/77 (!) 153/83  (!) 174/92  Pulse: 81 89 99   Resp: 20 (!) 21 (!) 22 18  Temp: 98.4 F (36.9 C) 98.4 F (36.9 C)  98 F (36.7 C)  TempSrc: Oral Oral  Oral  SpO2: 98% 97% 97% 98%  Weight:   84.5 kg   Height:         Intake/Output Summary (Last 24 hours) at 02/11/2019 0900 Last data filed at 02/11/2019 0416 Gross per 24 hour  Intake 1134.06 ml  Output 500 ml  Net 634.06 ml   Filed Weights   02/09/19 2047 02/10/19 1200 02/11/19 0525  Weight: 72.6 kg 80.4 kg 84.5 kg    Examination: General appearance: elderly adult male, alert and in no acute distress.  Sitting in recliner HEENT: Anicteric, conjunctiva pink, lids and lashes normal. No nasal deformity, discharge, epistaxis.  Lips moist, dentures in place, no oral lesions, OP moist, hearing poor.   Skin: Warm and dry.  no jaundice.  No suspicious rashes or lesions. Cardiac: RRR, nl S1-S2, no murmurs appreciated.  Capillary refill is brisk.  JVP not visible.  No LE edema at all.  Radia  pulses 2+ and symmetric. Respiratory: Normal respiratory rate and rhythm.  No wheezing.  Coarse rales in bases. Abdomen: Abdomen soft.  no TTP. No ascites, distension, hepatosplenomegaly.   MSK: No deformities or effusions. Neuro: Awake and alert.  EOMI, moves all extremities.  Speech fluent.    Psych: Sensorium intact and responding to questions, attention normal. Affect normal.  Judgment and insight appear impaired.    Data Reviewed: I have personally reviewed following labs and imaging studies:  CBC: Recent Labs  Lab 02/09/19 2100 02/10/19 0547 02/11/19 0303  WBC 11.1* 8.2 7.9  HGB 9.8* 8.6* 7.6*  HCT 30.8* 27.1* 23.1*  MCV 99.0 98.5 95.9  PLT 220 180 836   Basic Metabolic Panel: Recent Labs  Lab 02/09/19 2100 02/10/19 0547 02/11/19 0303  NA 137 137 137  K 4.4 4.1 4.3  CL 105 103 104  CO2 21* 24 23  GLUCOSE 138* 146* 120*  BUN 44* 43* 40*  CREATININE 1.44* 1.44* 1.45*  CALCIUM 9.1 8.8* 8.5*   GFR: Estimated Creatinine Clearance: 45.9 mL/min (A) (by C-G formula based on SCr of 1.45 mg/dL (H)). Liver Function Tests: Recent Labs  Lab 02/10/19 0756 02/11/19 0303  AST 142* 91*  ALT 39 41  ALKPHOS 71 59  BILITOT 0.6 0.5  PROT 7.5 6.7   ALBUMIN 3.7 3.0*   No results for input(s): LIPASE, AMYLASE in the last 168 hours. No results for input(s): AMMONIA in the last 168 hours. Coagulation Profile: No results for input(s): INR, PROTIME in the last 168 hours. Cardiac Enzymes: Recent Labs  Lab 02/09/19 2100 02/09/19 2332 02/10/19 0547 02/10/19 0756  TROPONINI 1.22* 1.18* 25.89* 37.67*   BNP (last 3 results) No results for input(s): PROBNP in the last 8760 hours. HbA1C: No results for input(s): HGBA1C in the last 72 hours. CBG: Recent Labs  Lab 02/10/19 0916  GLUCAP 113*   Lipid Profile: Recent Labs    02/11/19 0303  CHOL 125  HDL 26*  LDLCALC 86  TRIG 65  CHOLHDL 4.8   Thyroid Function Tests: No results for input(s): TSH, T4TOTAL, FREET4, T3FREE, THYROIDAB in the last 72 hours. Anemia Panel: Recent Labs    02/10/19 0756 02/10/19 0947 02/10/19 0948  VITAMINB12  --   --  535  FOLATE  --  45.5  --   FERRITIN  --   --  202  TIBC  --   --  232*  IRON  --   --  20*  RETICCTPCT 1.3  --   --    Urine analysis:    Component Value Date/Time   COLORURINE STRAW (A) 02/09/2019 2123   APPEARANCEUR CLEAR 02/09/2019 2123   LABSPEC 1.008 02/09/2019 2123   PHURINE 6.0 02/09/2019 2123   GLUCOSEU NEGATIVE 02/09/2019 2123   HGBUR SMALL (A) 02/09/2019 2123   [place] NEGATIVE 02/09/2019 2123   [place] NEGATIVE 02/09/2019 2123   PROTEINUR 30 (A) 02/09/2019 2123   NITRITE NEGATIVE 02/09/2019 2123   LEUKOCYTESUR NEGATIVE 02/09/2019 2123   Sepsis Labs: @LABRCNTIP (procalcitonin:4,lacticacidven:4)  ) Recent Results (from the past 240 hour(s))  Blood Culture (routine x 2)     Status: None (Preliminary result)   Collection Time: 02/09/19  9:34 PM  Result Value Ref Range Status   Specimen Description LEFT ANTECUBITAL  Final   Special Requests BOTTLES DRAWN AEROBIC AND ANAEROBIC  Final   Culture   Final    NO GROWTH 2 DAYS Performed at [facility], [address] [place], [place] [address]    Report  Status PENDING  Incomplete  Blood Culture (routine x 2)     Status: None (Preliminary result)   Collection Time: 02/09/19  9:36 PM  Result Value Ref Range Status   Specimen Description BLOOD LEFT HAND  Final  Special Requests   Final    BOTTLES DRAWN AEROBIC AND ANAEROBIC Blood Culture adequate volume   Culture   Final    NO GROWTH 2 DAYS Performed at [facility], [address], [place], [place] [address]    Report Status PENDING  Incomplete         Radiology Studies: Dg Chest Portable 1 View  Result Date: 02/09/2019 CLINICAL DATA:  Chest pain, nausea, vomiting EXAM: PORTABLE CHEST 1 VIEW COMPARISON:  06/08/2018 FINDINGS: Cardiomegaly. Dense consolidation in the right mid and lower lung. Mild left perihilar airspace opacities. Findings most likely reflect pneumonia. Small effusions bilaterally. No acute bony abnormality. IMPRESSION: Consolidation in the right mid and lower lung. Less pronounced left perihilar airspace opacities. Findings likely reflect pneumonia. Small bilateral effusions. Electronically Signed   By: Rolm Baptise M.D.   On: 02/09/2019 21:26   Ct Angio Chest/abd/pel For Dissection W And/or W/wo  Result Date: 02/10/2019 CLINICAL DATA:  Acute chest pain, back pain and shortness of breath. Suspicion of acute aortic syndrome versus myocardial infarct. EXAM: CT ANGIOGRAPHY CHEST, ABDOMEN AND PELVIS TECHNIQUE: Multidetector CT imaging through the chest, abdomen and pelvis was performed using the standard protocol during bolus administration of intravenous contrast. Multiplanar reconstructed images and MIPs were obtained and reviewed to evaluate the vascular anatomy. CONTRAST:  112mL OMNIPAQUE IOHEXOL 350 MG/ML SOLN COMPARISON:  Chest x-ray on 02/09/2019 FINDINGS: CTA CHEST FINDINGS Cardiovascular: No evidence of acute aortic dissection or intramural hemorrhage. Proximal great vessels are normally patent. There are some calcifications at the level of the aortic valve without  dilatation of the aortic root. The descending thoracic aorta demonstrates ulcerated plaque and mural thrombus with focal aneurysmal dilatation reaching maximal diameter 4.2-4.3 cm. The aorta measures 2.9 cm at the diaphragmatic hiatus. The heart size is at the upper limits of normal/mildly enlarged. The left ventricular cavity may be mildly dilated. There is some calcified plaque in the distribution of the LAD and likely right coronary artery. Central pulmonary arteries are of normal caliber. Mediastinum/Nodes: Multiple calcified mediastinal and bilateral hilar lymph nodes are consistent with prior granulomatous disease. No enlarged lymph nodes are identified. There is a small hiatal hernia. Lungs/Pleura: Lungs demonstrate ground-glass airspace opacity predominantly in the upper lobes bilaterally, right greater than left as well as in the right lower lobe. Associated small bilateral pleural effusions. Findings are likely consistent with pulmonary edema. Calcified granulomata are present in both posterior lower lobes. No pneumothorax. Musculoskeletal: No chest wall abnormality. No acute or significant osseous findings. Review of the MIP images confirms the above findings. CTA ABDOMEN AND PELVIS FINDINGS VASCULAR Aorta: Scattered atherosclerotic plaque without evidence of aneurysm or dissection. Celiac: Mild 25-30% stenosis at the origin. Distal branch vessels are normally patent. SMA: Normally patent. Renals: Single right renal artery demonstrates normal patency. Left kidney is supplied by a single vessel which almost immediately bifurcates after its origin. This vessel is normally patent. IMA: Normally patent. Inflow: Bilateral iliac arteries demonstrate normal patency and moderate tortuosity. There is some atherosclerotic plaque in the common iliac arteries bilaterally without evidence of significant stenosis. Bilateral common femoral arteries and femoral bifurcations are widely patent. Review of the MIP images  confirms the above findings. NON-VASCULAR Hepatobiliary: No focal liver abnormality is seen. No gallstones, gallbladder wall thickening, or biliary dilatation. Pancreas: Unremarkable. No pancreatic ductal dilatation or surrounding inflammatory changes. Spleen: Normal in size with multiple calcified granulomata. Adrenals/Urinary Tract: Adrenal glands are unremarkable. Kidneys are normal, without renal calculi, focal lesion, or hydronephrosis. Bladder  is unremarkable. Stomach/Bowel: Bowel shows no evidence of obstruction, ileus, inflammation or lesion. There is moderate stool in the rectum. No free air. Lymphatic: No enlarged lymph nodes identified. Reproductive: Status post prostatectomy. Penile prosthesis present with reservoir indenting the dome of the bladder. Other: Small left inguinal hernia contains fat. No abscess or ascites identified. Musculoskeletal: No acute or significant osseous findings. Review of the MIP images confirms the above findings. IMPRESSION: 1. Aneurysmal disease of the mid descending thoracic aorta with maximal diameter of 4.2-4.3 cm. This is associated with posterior wall ulcerated plaque and mural thrombus. No evidence of acute aortic dissection or intramural hemorrhage. Recommend follow-up CTA or MRA of the thoracic aorta in 1 year. 2. Pulmonary edema with more significant alveolar edema in the right lung and associated small bilateral pleural effusions. 3. Cardiac enlargement and suggestion of possible left ventricular cavity dilatation. 4. Coronary atherosclerosis with calcified plaque in the distribution of the LAD and also likely the RCA. 5. Evidence of prior granulomatous disease with multiple calcified mediastinal and bilateral hilar lymph nodes. Calcified granulomata are also present in both lower lungs and the spleen. 6. Small hiatal hernia.  Small left inguinal hernia containing fat. Electronically Signed   By: Aletta Edouard M.D.   On: 02/10/2019 10:18        Scheduled  Meds: . sodium chloride   Intravenous Once  . aspirin EC  81 mg Oral Daily  . atorvastatin  40 mg Oral q1800  . carbidopa-levodopa  1 tablet Oral BID  . carvedilol  12.5 mg Oral BID WC  . levothyroxine  88 mcg Oral QAC breakfast  . rasagiline  1 mg Oral Daily  . sodium chloride flush  3 mL Intravenous Q12H  . sodium chloride flush  3 mL Intravenous Q12H  . sodium chloride flush  3 mL Intravenous Q12H   Continuous Infusions: . sodium chloride    . sodium chloride    . sodium chloride    . sodium chloride    . azithromycin Stopped (02/10/19 1415)  . cefTRIAXone (ROCEPHIN)  IV Stopped (02/10/19 1253)  . heparin 900 Units/hr (02/11/19 0245)  . nitroGLYCERIN 20 mcg/min (02/10/19 2111)     LOS: 1 day    Time spent: 35 minutes    Edwin Dada, MD [facility] 02/11/2019, 9:00 AM     Please page through [place]:  [email or web address] If 7PM-7AM, please contact night-coverage

## 2019-02-11 NOTE — Progress Notes (Signed)
Progress Note  Patient Name: [name] Date of Encounter: 02/11/2019  Primary Cardiologist: Carlyle Dolly, MD   Subjective   Denies any CP currently.  Cath yesterday showed 100% CTO of mid RCA, 50% pLCx, 90% AV groove LCx and then 100% CTO of LPL, 90-95% LAD/D2 then 99% mid to dLAD, moderately elevated LVEDP c/w acute diastolic CHF.    Inpatient Medications    Scheduled Meds: . sodium chloride   Intravenous Once  . aspirin EC  81 mg Oral Daily  . atorvastatin  40 mg Oral q1800  . carbidopa-levodopa  1 tablet Oral BID  . carvedilol  12.5 mg Oral Once  . carvedilol  25 mg Oral BID WC  . furosemide  20 mg Intravenous Once  . levothyroxine  88 mcg Oral QAC breakfast  . rasagiline  1 mg Oral Daily  . sodium chloride flush  3 mL Intravenous Q12H  . sodium chloride flush  3 mL Intravenous Q12H  . sodium chloride flush  3 mL Intravenous Q12H   Continuous Infusions: . sodium chloride    . sodium chloride    . sodium chloride    . sodium chloride    . azithromycin Stopped (02/10/19 1415)  . cefTRIAXone (ROCEPHIN)  IV Stopped (02/10/19 1253)  . heparin 900 Units/hr (02/11/19 0245)  . nitroGLYCERIN 20 mcg/min (02/10/19 2111)   PRN Meds: sodium chloride, sodium chloride, sodium chloride, sodium chloride flush, sodium chloride flush, sodium chloride flush   Vital Signs    Vitals:   02/11/19 0004 02/11/19 0416 02/11/19 0525 02/11/19 0751  BP: 116/77 (!) 153/83  (!) 174/92  Pulse: 81 89 99   Resp: 20 (!) 21 (!) 22 18  Temp: 98.4 F (36.9 C) 98.4 F (36.9 C)  98 F (36.7 C)  TempSrc: Oral Oral  Oral  SpO2: 98% 97% 97% 98%  Weight:   84.5 kg   Height:        Intake/Output Summary (Last 24 hours) at 02/11/2019 0937 Last data filed at 02/11/2019 9147 Gross per 24 hour  Intake 1374.06 ml  Output 1000 ml  Net 374.06 ml   Filed Weights   02/09/19 2047 02/10/19 1200 02/11/19 0525  Weight: 72.6 kg 80.4 kg 84.5 kg    Telemetry    NSR - Personally Reviewed  ECG      No new EKG to review - Personally Reviewed  Physical Exam   GEN: No acute distress.   Neck: No JVD Cardiac: RRR, no murmurs, rubs, or gallops.  Respiratory: Clear to auscultation bilaterally. GI: Soft, nontender, non-distended  MS: No edema; No deformity. Neuro:  Nonfocal  Psych: Normal affect   Labs    Chemistry Recent Labs  Lab 02/09/19 2100 02/10/19 0547 02/10/19 0756 02/11/19 0303  NA 137 137  --  137  K 4.4 4.1  --  4.3  CL 105 103  --  104  CO2 21* 24  --  23  GLUCOSE 138* 146*  --  120*  BUN 44* 43*  --  40*  CREATININE 1.44* 1.44*  --  1.45*  CALCIUM 9.1 8.8*  --  8.5*  PROT  --   --  7.5 6.7  ALBUMIN  --   --  3.7 3.0*  AST  --   --  142* 91*  ALT  --   --  39 41  ALKPHOS  --   --  71 59  BILITOT  --   --  0.6 0.5  GFRNONAA 47*  27*  --  [place] 11 10  --  10     Hematology Recent Labs  Lab 02/09/19 2100 02/10/19 0547 02/10/19 0756 02/11/19 0303  WBC 11.1* 8.2  --  7.9  RBC 3.11* 2.75* 2.68* 2.41*  HGB 9.8* 8.6*  --  7.6*  HCT 30.8* 27.1*  --  23.1*  MCV 99.0 98.5  --  95.9  MCH 31.5 31.3  --  31.5  MCHC 31.8 31.7  --  32.9  RDW 13.6 13.3  --  13.1  PLT 220 180  --  191    Cardiac Enzymes Recent Labs  Lab 02/09/19 2100 02/09/19 2332 02/10/19 0547 02/10/19 0756  TROPONINI 1.22* 1.18* 25.89* 37.67*   No results for input(s): TROPIPOC in the last 168 hours.   BNP Recent Labs  Lab 02/09/19 2100  BNP 1,647.0*     DDimer No results for input(s): DDIMER in the last 168 hours.   Radiology    Dg Chest Portable 1 View  Result Date: 02/09/2019 CLINICAL DATA:  Chest pain, nausea, vomiting EXAM: PORTABLE CHEST 1 VIEW COMPARISON:  06/08/2018 FINDINGS: Cardiomegaly. Dense consolidation in the right mid and lower lung. Mild left perihilar airspace opacities. Findings most likely reflect pneumonia. Small effusions bilaterally. No acute bony abnormality. IMPRESSION: Consolidation in the right mid and lower  lung. Less pronounced left perihilar airspace opacities. Findings likely reflect pneumonia. Small bilateral effusions. Electronically Signed   By: Rolm Baptise M.D.   On: 02/09/2019 21:26   Ct Angio Chest/abd/pel For Dissection W And/or W/wo  Result Date: 02/10/2019 CLINICAL DATA:  Acute chest pain, back pain and shortness of breath. Suspicion of acute aortic syndrome versus myocardial infarct. EXAM: CT ANGIOGRAPHY CHEST, ABDOMEN AND PELVIS TECHNIQUE: Multidetector CT imaging through the chest, abdomen and pelvis was performed using the standard protocol during bolus administration of intravenous contrast. Multiplanar reconstructed images and MIPs were obtained and reviewed to evaluate the vascular anatomy. CONTRAST:  168mL OMNIPAQUE IOHEXOL 350 MG/ML SOLN COMPARISON:  Chest x-ray on 02/09/2019 FINDINGS: CTA CHEST FINDINGS Cardiovascular: No evidence of acute aortic dissection or intramural hemorrhage. Proximal great vessels are normally patent. There are some calcifications at the level of the aortic valve without dilatation of the aortic root. The descending thoracic aorta demonstrates ulcerated plaque and mural thrombus with focal aneurysmal dilatation reaching maximal diameter 4.2-4.3 cm. The aorta measures 2.9 cm at the diaphragmatic hiatus. The heart size is at the upper limits of normal/mildly enlarged. The left ventricular cavity may be mildly dilated. There is some calcified plaque in the distribution of the LAD and likely right coronary artery. Central pulmonary arteries are of normal caliber. Mediastinum/Nodes: Multiple calcified mediastinal and bilateral hilar lymph nodes are consistent with prior granulomatous disease. No enlarged lymph nodes are identified. There is a small hiatal hernia. Lungs/Pleura: Lungs demonstrate ground-glass airspace opacity predominantly in the upper lobes bilaterally, right greater than left as well as in the right lower lobe. Associated small bilateral pleural effusions.  Findings are likely consistent with pulmonary edema. Calcified granulomata are present in both posterior lower lobes. No pneumothorax. Musculoskeletal: No chest wall abnormality. No acute or significant osseous findings. Review of the MIP images confirms the above findings. CTA ABDOMEN AND PELVIS FINDINGS VASCULAR Aorta: Scattered atherosclerotic plaque without evidence of aneurysm or dissection. Celiac: Mild 25-30% stenosis at the origin. Distal branch vessels are normally patent. SMA: Normally patent. Renals: Single right renal artery demonstrates normal patency. Left kidney  is supplied by a single vessel which almost immediately bifurcates after its origin. This vessel is normally patent. IMA: Normally patent. Inflow: Bilateral iliac arteries demonstrate normal patency and moderate tortuosity. There is some atherosclerotic plaque in the common iliac arteries bilaterally without evidence of significant stenosis. Bilateral common femoral arteries and femoral bifurcations are widely patent. Review of the MIP images confirms the above findings. NON-VASCULAR Hepatobiliary: No focal liver abnormality is seen. No gallstones, gallbladder wall thickening, or biliary dilatation. Pancreas: Unremarkable. No pancreatic ductal dilatation or surrounding inflammatory changes. Spleen: Normal in size with multiple calcified granulomata. Adrenals/Urinary Tract: Adrenal glands are unremarkable. Kidneys are normal, without renal calculi, focal lesion, or hydronephrosis. Bladder is unremarkable. Stomach/Bowel: Bowel shows no evidence of obstruction, ileus, inflammation or lesion. There is moderate stool in the rectum. No free air. Lymphatic: No enlarged lymph nodes identified. Reproductive: Status post prostatectomy. Penile prosthesis present with reservoir indenting the dome of the bladder. Other: Small left inguinal hernia contains fat. No abscess or ascites identified. Musculoskeletal: No acute or significant osseous findings.  Review of the MIP images confirms the above findings. IMPRESSION: 1. Aneurysmal disease of the mid descending thoracic aorta with maximal diameter of 4.2-4.3 cm. This is associated with posterior wall ulcerated plaque and mural thrombus. No evidence of acute aortic dissection or intramural hemorrhage. Recommend follow-up CTA or MRA of the thoracic aorta in 1 year. 2. Pulmonary edema with more significant alveolar edema in the right lung and associated small bilateral pleural effusions. 3. Cardiac enlargement and suggestion of possible left ventricular cavity dilatation. 4. Coronary atherosclerosis with calcified plaque in the distribution of the LAD and also likely the RCA. 5. Evidence of prior granulomatous disease with multiple calcified mediastinal and bilateral hilar lymph nodes. Calcified granulomata are also present in both lower lungs and the spleen. 6. Small hiatal hernia.  Small left inguinal hernia containing fat. Electronically Signed   By: Aletta Edouard M.D.   On: 02/10/2019 10:18    Cardiac Studies   Cardiac Cath 02/10/2019 Conclusion     RCA Prox RCA lesion is 50% stenosed. Prox RCA to Mid RCA lesion is 80% stenosed. Mid RCA lesion is 90% stenosed. Mid RCA to Dist RCA lesion is 100% stenosed.  ----- Part of the PDA and PL system was filled via right to right and left-to-right collaterals.  Cx-OM: Ost Cx to Prox Cx lesion is 45% stenosed with 90% stenosed side branch in LAV Groove.  ------ 3rd LPL-1 lesion is 80% stenosed. 3rd LPL-2 lesion is 99% stenosed.  Prox LAD to Mid LAD lesion is 90% stenosed with 55% stenosed side branch in Ost 2nd Diag.  Mid-distal LAD to Dist LAD lesion is 95% stenosed. Dist LAD lesion is 90% stenosed.  --  LV end diastolic pressure is moderately elevated. Consistent with acute on chronic diastolic heart failure   SUMMARY  Severe multivessel disease: 100% CTO of mid RCA, 50% proximal circumflex with 90% AV groove followed by 100% CTO of LPL (that  recanalizes through left left lateral), 90 to 95% bifurcation LAD-2nd Diag followed by 60% and then tandem 99 to 90% lesions in the mid to distal LAD. -- >  Minimal good PCI or CABG options  Moderately elevated LVEDP consistent with acute diastolic heart failure  RECOMMENDATIONS  The patient will be transferred to a telemetry bed.  I plan to restart IV heparin to run for 48 hours for MI.  (Will restart 8 hours post sheath removal)  Continue aggressive medical management.  I  discussed the findings with the patient's power of attorney/brother and sister-in-law:  Options going forward would be medical management, CABG, random PCI.  After discussing with the brother: ? It is clear that PCI is not likely a good option as it would not be a good candidate for dual antiplatelet therapy with frequent falls, he is also was anemic.  ? CABG would not be something that they would be interested in offering based on his dementia, renal insufficiency and Parkinson's.  Would not likely recover.   ? Best option going forward would be medical management.   With renal insufficiency, we will hydrate him, but he would likely require diuresis and standing diuretic during this hospitalization.  2D echo was ordered to better assess his EF, however I suspect it to be reduced.      Patient Profile     78 y.o. male with a hx of Dementia, chronic combined CHF, cardiomyopathy, uptured MV chordae tendinae, CKD III, chronic appearing anemia, anxiety, GERD, HTN, HLD, Parkinson's disease, incontinence (wears pull-up) who is being seen today for the evaluation of troponin of 25  Assessment & Plan    1. NSTEMI/ASCAD - Cath yesterday showed 100% CTO of mid RCA, 50% pLCx, 90% AV groove LCx and then 100% CTO of LPL, 90-95% LAD/D2 then 99% mid to dLAD, moderately elevated LVEDP c/w acute diastolic CHF. -PCI felt not to be good option due to frequent falls, anemia and need for DAPT.   -family not interested in CABG due to  dementia, CKD and Parkinson's and would likely not recover -Medical management recommended.   -Continue on ASA 81mg  daily, statin, BB -d/c IV NTG and change to Imdur 30mg  daily  2.  Acute diastolic CHF -LVEDP elevated at cath -Chest CT with CHF -2D echo pending -he is net neg 236cc -Got Lasix 20mg  IV this am -Will give Lasix 40mg  IV this am and reassess tomorrow -daily weights, I&O's and follow renal function closely while diuresing.  -creatinine 1.45 this am  3. Acute on chronic anemia -pt denies any obvious bleeding.  -Hemoccult  ordered.  -Iron low at 20 but ferritin 202 -per TRH  4. HTN -BP elevated at 174/72mmHg -carvedilol increased to 25mg  BID this am -add ACE I/ARB if renal function remains stable with diuresis  5. Hyperlipidemia -changed to Lipitor 40mg .  -will need FLP and ALT in 6 weeks  6. Acute hypoxic respiratory failure  -secondary to PNA/CHF   7. CKD stage III  -Cr appears near baseline.  -He is at risk for worsening renal function this admission based on probable MI, CHF requiring Lasix administration and need for contrast load.  -He was on meloxicam at home which has been stopped  8. Parkinson's/dementia  - will defer resumption of home meds to IM  9.  Mild descending thoracic aortic aneurysm with ulcerated plaque and mural thrombus -CTA showed 4.2-4.3 aneurysm with posterior wall ulcerated plaque and mural thrombus -continue IV Heparin gtt  I have spent a total of 40 minutes with patient reviewing Chest CTA, cardiac cath , telemetry, EKGs, labs and examining patient as well as establishing an assessment and plan that was discussed with the patient.  > 50% of time was spent in direct patient care.    For questions or updates, please contact [place] Please consult [email or web address] for contact info under Cardiology/STEMI.      Signed, Fransico Him, MD  02/11/2019, 9:37 AM

## 2019-02-11 NOTE — Progress Notes (Addendum)
ANTICOAGULATION CONSULT NOTE  Pharmacy Consult for heparin Indication: chest pain/ACS  No Known Allergies  Patient Measurements: Height: 5\' 8"  (172.7 cm) Weight: 186 lb 4.6 oz (84.5 kg) IBW/kg (Calculated) : 68.4 HEPARIN DW (KG): 72.6   Vital Signs: Temp: 98 F (36.7 C) (02/29 0751) Temp Source: Oral (02/29 0751) BP: 174/92 (02/29 0751) Pulse Rate: 99 (02/29 0525)  Labs: Recent Labs    02/09/19 2100 02/09/19 2332 02/10/19 0547 02/10/19 0756 02/10/19 1347 02/11/19 0303 02/11/19 0953  HGB 9.8*  --  8.6*  --   --  7.6*  --   HCT 30.8*  --  27.1*  --   --  23.1*  --   PLT 220  --  180  --   --  191  --   HEPARINUNFRC  --   --   --   --  0.29*  --  0.15*  CREATININE 1.44*  --  1.44*  --   --  1.45*  --   TROPONINI 1.22* 1.18* 25.89* 37.67*  --   --   --    Estimated Creatinine Clearance: 45.9 mL/min (A) (by C-G formula based on SCr of 1.45 mg/dL (H)).  Medical History: Past Medical History:  Diagnosis Date  . Anxiety   . Cardiomyopathy ([place])   . Chronic combined systolic and diastolic CHF (congestive heart failure) ([place])   . Dementia ([place])   . GERD (gastroesophageal reflux disease)   . Hyperlipidemia   . Hypertension   . Incontinence   . 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  . Myocardial infarction ([place])   . Neuromuscular disorder ([place])   . Parkinson's disease ([place])   . Peripheral vascular disease ([facility])     Medications:  Scheduled:  . sodium chloride   Intravenous Once  . aspirin EC  81 mg Oral Daily  . atorvastatin  40 mg Oral q1800  . carbidopa-levodopa  1 tablet Oral BID  . carvedilol  12.5 mg Oral Once  . carvedilol  25 mg Oral BID WC  . furosemide  40 mg Intravenous Once  . isosorbide mononitrate  30 mg Oral Daily  . levothyroxine  88 mcg Oral QAC breakfast  . rasagiline  1 mg Oral Daily  . sodium chloride flush  3 mL Intravenous Q12H  . sodium chloride flush  3 mL Intravenous Q12H  . sodium chloride flush  3 mL Intravenous  Q12H   Infusions:  . sodium chloride    . sodium chloride    . sodium chloride    . sodium chloride    . azithromycin Stopped (02/10/19 1415)  . cefTRIAXone (ROCEPHIN)  IV Stopped (02/10/19 1253)  . heparin 900 Units/hr (02/11/19 0245)   Anti-infectives (From admission, onward)   Start     Dose/Rate Route Frequency Ordered Stop   02/10/19 1700  cefTRIAXone (ROCEPHIN) 1 g in sodium chloride 0.9 % 100 mL IVPB  Status:  Discontinued     1 g 200 mL/hr over 30 Minutes Intravenous Every 24 hours 02/10/19 1659 02/10/19 1701   02/10/19 1700  azithromycin (ZITHROMAX) 500 mg in sodium chloride 0.9 % 250 mL IVPB  Status:  Discontinued     500 mg 250 mL/hr over 60 Minutes Intravenous Every 24 hours 02/10/19 1659 02/10/19 1701   02/10/19 1200  cefTRIAXone (ROCEPHIN) 1 g in sodium chloride 0.9 % 100 mL IVPB     1 g 200 mL/hr over 30 Minutes Intravenous Every 24 hours 02/10/19 1149  02/10/19 1200  azithromycin (ZITHROMAX) 500 mg in sodium chloride 0.9 % 250 mL IVPB     500 mg 250 mL/hr over 60 Minutes Intravenous Every 24 hours 02/10/19 1149     02/10/19 0900  vancomycin (VANCOCIN) IVPB 750 mg/150 ml premix  Status:  Discontinued     750 mg 150 mL/hr over 60 Minutes Intravenous Every 12 hours 02/10/19 0800 02/10/19 1143   02/10/19 0815  piperacillin-tazobactam (ZOSYN) IVPB 3.375 g  Status:  Discontinued     3.375 g 12.5 mL/hr over 240 Minutes Intravenous Every 8 hours 02/10/19 0802 02/10/19 1143   02/09/19 2130  vancomycin (VANCOCIN) IVPB 1000 mg/200 mL premix     1,000 mg 200 mL/hr over 60 Minutes Intravenous  Once 02/09/19 2123 02/09/19 2240   02/09/19 2130  piperacillin-tazobactam (ZOSYN) IVPB 3.375 g     3.375 g 100 mL/hr over 30 Minutes Intravenous  Once 02/09/19 2123 02/09/19 2218      Assessment: 78 yo male with CP s/p cath with severe multivessel disease and plans are for heparin for 48hrs. He is also noted with a  descending aortic aneurysm with associated mural thrombus.  Pharmacy consulted to restart heparin s/p LHC on 2/28.   HL this AM is subtherapeutic at 0.15. H/H down, Plt wnl. No s/s of bleeding   Goal of Therapy:  Heparin level 0.3-0.7 units/ml    Plan:  -Increase heparin to 1100 units/hr  -Heparin level in 8 hours and daily wth CBC daily  Albertina Parr, PharmD., BCPS Clinical Pharmacist Clinical phone for 02/11/19 until 3:30pm: [phone] If after 3:30pm, please refer to [facility] for unit-specific pharmacist   Addendum: Repeat HL this evening trended down to 0.11. Increase IV heparin to 1350 units and f/u AM HL.   Albertina Parr, PharmD., BCPS Clinical Pharmacist

## 2019-02-12 ENCOUNTER — Inpatient Hospital Stay (HOSPITAL_COMMUNITY): Payer: PPO

## 2019-02-12 DIAGNOSIS — I351 Nonrheumatic aortic (valve) insufficiency: Secondary | ICD-10-CM

## 2019-02-12 DIAGNOSIS — I712 Thoracic aortic aneurysm, without rupture: Secondary | ICD-10-CM

## 2019-02-12 LAB — ECHOCARDIOGRAM COMPLETE
Height: 68 in
Weight: 2955.93 oz

## 2019-02-12 LAB — BASIC METABOLIC PANEL
Anion gap: 10 (ref 5–15)
BUN: 36 mg/dL — ABNORMAL HIGH (ref 8–23)
CO2: 23 mmol/L (ref 22–32)
Calcium: 8.6 mg/dL — ABNORMAL LOW (ref 8.9–10.3)
Chloride: 106 mmol/L (ref 98–111)
Creatinine, Ser: 1.71 mg/dL — ABNORMAL HIGH (ref 0.61–1.24)
GFR calc non Af Amer: 38 mL/min — ABNORMAL LOW (ref >60)
GFR, EST AFRICAN AMERICAN: 44 mL/min — AB (ref >60)
Glucose, Bld: 116 mg/dL — ABNORMAL HIGH (ref 70–99)
Potassium: 4 mmol/L (ref 3.5–5.1)
Sodium: 139 mmol/L (ref 135–145)

## 2019-02-12 LAB — TYPE AND SCREEN
ABO/RH(D): B POS
Antibody Screen: NEGATIVE
Unit division: 0

## 2019-02-12 LAB — CBC
HCT: 27.1 % — ABNORMAL LOW (ref 39.0–52.0)
Hemoglobin: 9.1 g/dL — ABNORMAL LOW (ref 13.0–17.0)
MCH: 31.4 pg (ref 26.0–34.0)
MCHC: 33.6 g/dL (ref 30.0–36.0)
MCV: 93.4 fL (ref 80.0–100.0)
NRBC: 0 % (ref 0.0–0.2)
Platelets: 185 10*3/uL (ref 150–400)
RBC: 2.9 MIL/uL — ABNORMAL LOW (ref 4.22–5.81)
RDW: 14.7 % (ref 11.5–15.5)
WBC: 6.6 10*3/uL (ref 4.0–10.5)

## 2019-02-12 LAB — BPAM RBC
Blood Product Expiration Date: 202003252359
ISSUE DATE / TIME: 202002291502
Unit Type and Rh: 7300

## 2019-02-12 LAB — TSH: TSH: 8.726 u[IU]/mL — ABNORMAL HIGH (ref 0.350–4.500)

## 2019-02-12 LAB — PROCALCITONIN: Procalcitonin: 0.28 ng/mL

## 2019-02-12 LAB — HEPARIN LEVEL (UNFRACTIONATED): Heparin Unfractionated: 0.33 IU/mL (ref 0.30–0.70)

## 2019-02-12 MED ORDER — AMLODIPINE BESYLATE 5 MG PO TABS
5.0000 mg | ORAL_TABLET | Freq: Every day | ORAL | Status: DC
  Administered 2019-02-12 – 2019-02-14 (×3): 5 mg via ORAL
  Filled 2019-02-12 (×3): qty 1

## 2019-02-12 MED ORDER — AZITHROMYCIN 250 MG PO TABS
250.0000 mg | ORAL_TABLET | Freq: Every day | ORAL | Status: AC
  Administered 2019-02-13 – 2019-02-14 (×2): 250 mg via ORAL
  Filled 2019-02-12 (×2): qty 1

## 2019-02-12 MED ORDER — CEFDINIR 300 MG PO CAPS
300.0000 mg | ORAL_CAPSULE | Freq: Two times a day (BID) | ORAL | Status: DC
  Administered 2019-02-13 – 2019-02-14 (×3): 300 mg via ORAL
  Filled 2019-02-12 (×3): qty 1

## 2019-02-12 MED ORDER — ENOXAPARIN SODIUM 40 MG/0.4ML ~~LOC~~ SOLN
40.0000 mg | SUBCUTANEOUS | Status: DC
  Administered 2019-02-12: 40 mg via SUBCUTANEOUS
  Filled 2019-02-12: qty 0.4

## 2019-02-12 MED ORDER — PERFLUTREN LIPID MICROSPHERE
1.0000 mL | INTRAVENOUS | Status: AC | PRN
  Administered 2019-02-12: 2 mL via INTRAVENOUS
  Filled 2019-02-12: qty 10

## 2019-02-12 NOTE — Progress Notes (Addendum)
Progress Note  Patient Name: [name] Date of Encounter: 02/12/2019  Primary Cardiologist: Carlyle Dolly, MD   Subjective   Cath showed 100% CTO of mid RCA, 50% pLCx, 90% AV groove LCx and then 100% CTO of LPL, 90-95% LAD/D2 then 99% mid to dLAD, moderately elevated LVEDP c/w acute diastolic CHF.  Has not had any chest pain or SOB.  LE edema has resolved.  Inpatient Medications    Scheduled Meds: . sodium chloride   Intravenous Once  . aspirin EC  81 mg Oral Daily  . atorvastatin  40 mg Oral q1800  . [START ON 02/13/2019] azithromycin  250 mg Oral Daily  . carbidopa-levodopa  1 tablet Oral BID  . carvedilol  12.5 mg Oral Once  . carvedilol  25 mg Oral BID WC  . [START ON 02/13/2019] cefdinir  300 mg Oral Q12H  . enoxaparin (LOVENOX) injection  40 mg Subcutaneous Q24H  . isosorbide mononitrate  30 mg Oral Daily  . levothyroxine  88 mcg Oral QAC breakfast  . rasagiline  1 mg Oral Daily  . sodium chloride flush  3 mL Intravenous Q12H  . sodium chloride flush  3 mL Intravenous Q12H  . sodium chloride flush  3 mL Intravenous Q12H   Continuous Infusions: . sodium chloride    . sodium chloride    . sodium chloride    . azithromycin 500 mg (02/11/19 1224)  . cefTRIAXone (ROCEPHIN)  IV 1 g (02/11/19 1135)   PRN Meds: sodium chloride, sodium chloride, sodium chloride, perflutren lipid microspheres (DEFINITY) IV suspension, sodium chloride flush, sodium chloride flush, sodium chloride flush   Vital Signs    Vitals:   02/11/19 2357 02/12/19 0400 02/12/19 0420 02/12/19 0800  BP: (!) 172/84 (!) 168/87  (!) 155/87  Pulse: 90 81  91  Resp: 19 19  15   Temp:  98.3 F (36.8 C)    TempSrc:  Oral    SpO2: 96% 95%  100%  Weight:   83.8 kg   Height:        Intake/Output Summary (Last 24 hours) at 02/12/2019 1110 Last data filed at 02/12/2019 0420 Gross per 24 hour  Intake 1006.15 ml  Output 1600 ml  Net -593.85 ml   Filed Weights   02/10/19 1200 02/11/19 0525 02/12/19 0420    Weight: 80.4 kg 84.5 kg 83.8 kg    Telemetry    Normal sinus rhythm- Personally Reviewed  ECG    No new EKG to review- Personally Reviewed  Physical Exam   GEN: Well nourished, well developed in no acute distress HEENT: Normal NECK: No JVD; No carotid bruits LYMPHATICS: No lymphadenopathy CARDIAC:RRR, no murmurs, rubs, gallops RESPIRATORY:  Clear to auscultation without rales, wheezing or rhonchi  ABDOMEN: Soft, non-tender, non-distended MUSCULOSKELETAL:  No edema; No deformity  SKIN: Warm and dry NEUROLOGIC:  Alert and oriented x 3 PSYCHIATRIC:  Normal affect    Labs    Chemistry Recent Labs  Lab 02/10/19 0547 02/10/19 0756 02/11/19 0303 02/12/19 0253  NA 137  --  137 139  K 4.1  --  4.3 4.0  CL 103  --  104 106  CO2 24  --  23 23  GLUCOSE 146*  --  120* 116*  BUN 43*  --  40* 36*  CREATININE 1.44*  --  1.45* 1.71*  CALCIUM 8.8*  --  8.5* 8.6*  PROT  --  7.5 6.7  --   ALBUMIN  --  3.7 3.0*  --  AST  --  142* 91*  --   ALT  --  39 41  --   ALKPHOS  --  71 59  --   BILITOT  --  0.6 0.5  --   GFRNONAA 47*  --  46* 38*  GFRAA 54*  --  54* 44*  ANIONGAP 10  --  10 10     Hematology Recent Labs  Lab 02/10/19 0547 02/10/19 0756 02/11/19 0303 02/12/19 0253  WBC 8.2  --  7.9 6.6  RBC 2.75* 2.68* 2.41* 2.90*  HGB 8.6*  --  7.6* 9.1*  HCT 27.1*  --  23.1* 27.1*  MCV 98.5  --  95.9 93.4  MCH 31.3  --  31.5 31.4  MCHC 31.7  --  32.9 33.6  RDW 13.3  --  13.1 14.7  PLT 180  --  191 185    Cardiac Enzymes Recent Labs  Lab 02/09/19 2100 02/09/19 2332 02/10/19 0547 02/10/19 0756  TROPONINI 1.22* 1.18* 25.89* 37.67*   No results for input(s): TROPIPOC in the last 168 hours.   BNP Recent Labs  Lab 02/09/19 2100  BNP 1,647.0*     DDimer No results for input(s): DDIMER in the last 168 hours.   Radiology    No results found.  Cardiac Studies   Cardiac Cath 02/10/2019 Conclusion     RCA Prox RCA lesion is 50% stenosed. Prox RCA to Mid  RCA lesion is 80% stenosed. Mid RCA lesion is 90% stenosed. Mid RCA to Dist RCA lesion is 100% stenosed.  ----- Part of the PDA and PL system was filled via right to right and left-to-right collaterals.  Cx-OM: Ost Cx to Prox Cx lesion is 45% stenosed with 90% stenosed side branch in LAV Groove.  ------ 3rd LPL-1 lesion is 80% stenosed. 3rd LPL-2 lesion is 99% stenosed.  Prox LAD to Mid LAD lesion is 90% stenosed with 55% stenosed side branch in Ost 2nd Diag.  Mid-distal LAD to Dist LAD lesion is 95% stenosed. Dist LAD lesion is 90% stenosed.  --  LV end diastolic pressure is moderately elevated. Consistent with acute on chronic diastolic heart failure   SUMMARY  Severe multivessel disease: 100% CTO of mid RCA, 50% proximal circumflex with 90% AV groove followed by 100% CTO of LPL (that recanalizes through left left lateral), 90 to 95% bifurcation LAD-2nd Diag followed by 60% and then tandem 99 to 90% lesions in the mid to distal LAD. -- >  Minimal good PCI or CABG options  Moderately elevated LVEDP consistent with acute diastolic heart failure  RECOMMENDATIONS  The patient will be transferred to a telemetry bed.  I plan to restart IV heparin to run for 48 hours for MI.  (Will restart 8 hours post sheath removal)  Continue aggressive medical management.  I discussed the findings with the patient's power of attorney/brother and sister-in-law:  Options going forward would be medical management, CABG, random PCI.  After discussing with the brother: ? It is clear that PCI is not likely a good option as it would not be a good candidate for dual antiplatelet therapy with frequent falls, he is also was anemic.  ? CABG would not be something that they would be interested in offering based on his dementia, renal insufficiency and Parkinson's.  Would not likely recover.   ? Best option going forward would be medical management.   With renal insufficiency, we will hydrate him, but he would  likely require diuresis and standing diuretic  during this hospitalization.  2D echo was ordered to better assess his EF, however I suspect it to be reduced.      Patient Profile     78 y.o. male with a hx of Dementia, chronic combined CHF, cardiomyopathy, uptured MV chordae tendinae, CKD III, chronic appearing anemia, anxiety, GERD, HTN, HLD, Parkinson's disease, incontinence (wears pull-up) who is being seen today for the evaluation of troponin of 25  Assessment & Plan    1. NSTEMI/ASCAD - Cath 02/10/2019 showed 100% CTO of mid RCA, 50% pLCx, 90% AV groove LCx and then 100% CTO of LPL, 90-95% LAD/D2 then 99% mid to dLAD, moderately elevated LVEDP c/w acute diastolic CHF. -PCI felt not to be good option due to frequent falls, anemia and need for DAPT.   -family not interested in CABG due to dementia, CKD and Parkinson's and would likely not recover -Medical management recommended.   -Continue on ASA 81mg  daily, statin, BB and Imdur 30mg  daily  2.  Acute diastolic CHF -LVEDP elevated at cath -Chest CT with CHF -2D echo has been done but results pending -he is 2.1L neg from yesterday and net neg 830cc -weight down 2lbs from yesterday but still up from admit -creatinine has bumped from 1.45 yesterday to 1.71 today  -he does not appear volume overloaded on exam today.  -hold Lasix today and reassess tomorrow am -daily weights, I&O's and follow renal function closely while diuresing.   3. Acute on chronic anemia -pt denies any obvious bleeding.  -Hemoccult  ordered.  -Iron low at 20 but ferritin 202 -per TRH  4. HTN -BP remains elevated at 155/32mmHg this am -carvedilol increased to 25mg  BID yesterday -cannot use ACE/ARB at present due to rising creatinine -add amlodipine 5mg  daily  5. Hyperlipidemia -changed to Lipitor 40mg .  -will need FLP and ALT in 6 weeks  6. Acute hypoxic respiratory failure  -secondary to PNA/CHF   7. CKD stage III  -Cr bumped today due to  diuresis  -He is at risk for worsening renal function this admission based on probable MI, CHF requiring Lasix administration and need for contrast load.  -He was on meloxicam at home which has been stopped -hold further diuretics for now -follow renal function  8. Parkinson's/dementia  - will defer resumption of home meds to IM  9.  Mild descending thoracic aortic aneurysm with ulcerated plaque and mural thrombus -CTA showed 4.2-4.3 aneurysm with posterior wall ulcerated plaque and mural thrombus -IV Heparin gtt stopped and on Lovenox SQ DVT -will touch base with Vascular about further treatment   For questions or updates, please contact [place] Please consult [email or web address] for contact info under Cardiology/STEMI.      Signed, Fransico Him, MD  02/12/2019, 11:10 AM

## 2019-02-12 NOTE — Progress Notes (Signed)
  Echocardiogram 2D Echocardiogram has been performed.  Merrie Roof F 02/12/2019, 9:54 AM

## 2019-02-12 NOTE — Evaluation (Signed)
Physical Therapy Evaluation Patient Details Name: [name] MRN: [id number] DOB: 01-03-1941 Today's Date: 02/12/2019   History of Present Illness  Pt is a 78 y.o. M with significant PMH of CHF, CKD, hypertension, Parkinon's disease, and dementia. Admitted with chest pain with elevated troponin. Cardiac cath showing severe multivessel disease and cardiology recommending medical management.  Clinical Impression  Pt admitted with above diagnosis. Pt currently with functional limitations due to the deficits listed below (see PT Problem List). On PT evaluation, pt presenting with decreased cognition and balance impairments. Ambulating in room with walker and min assist. Pt presents as high fall risk based on recurrent falling at home and poor safety awareness. Discussed with pt brother (pt POA) separately and pt brother is wanting pt to go to SNF at discharge as pt lives with his disabled wife who is unable to care for him. Recommending SNF at discharge. Will follow acutely.     Follow Up Recommendations SNF    Equipment Recommendations  None recommended by PT    Recommendations for Other Services OT consult     Precautions / Restrictions Precautions Precautions: Fall Restrictions Weight Bearing Restrictions: No      Mobility  Bed Mobility               General bed mobility comments: Sitting on EOB upon arrival with NT  Transfers Overall transfer level: Needs assistance Equipment used: Rolling walker (2 wheeled) Transfers: Sit to/from Stand Sit to Stand: Mod assist         General transfer comment: ModA to stand from lower toilet surface. pt preferring to pull up on walker and required stabilization by PT  Ambulation/Gait Ambulation/Gait assistance: Min assist Gait Distance (Feet): 25 Feet Assistive device: Rolling walker (2 wheeled) Gait Pattern/deviations: Step-through pattern;Trunk flexed Gait velocity: decreased   General Gait Details: pt ambulating to and  from bathroom with min assist for balance/stability. trunk flexed positioning throughout. bumping into objects in room  Stairs            Wheelchair Mobility    Modified Rankin (Stroke Patients Only)       Balance Overall balance assessment: Needs assistance Sitting-balance support: Feet supported Sitting balance-Leahy Scale: Fair     Standing balance support: Bilateral upper extremity supported Standing balance-Leahy Scale: Poor                               Pertinent Vitals/Pain Pain Assessment: No/denies pain    Home Living Family/patient expects to be discharged to:: Skilled nursing facility                      Prior Function Level of Independence: Independent with assistive device(s)         Comments: History of frequent falling. Pt lives with his wife who is disabled      Journalist, newspaper        Extremity/Trunk Assessment   Upper Extremity Assessment Upper Extremity Assessment: Generalized weakness    Lower Extremity Assessment Lower Extremity Assessment: Generalized weakness    Cervical / Trunk Assessment Cervical / Trunk Assessment: Kyphotic  Communication   Communication: No difficulties  Cognition Arousal/Alertness: Awake/alert Behavior During Therapy: WFL for tasks assessed/performed Overall Cognitive Status: History of cognitive impairments - at baseline  General Comments: decreased awareness of deficits      General Comments      Exercises     Assessment/Plan    PT Assessment Patient needs continued PT services  PT Problem List Decreased strength;Decreased activity tolerance;Decreased balance;Decreased mobility;Decreased coordination;Decreased cognition;Decreased safety awareness       PT Treatment Interventions DME instruction;Gait training;Therapeutic activities;Functional mobility training;Therapeutic exercise;Balance training;Patient/family education     PT Goals (Current goals can be found in the Care Plan section)  Acute Rehab PT Goals Patient Stated Goal: "go home." PT Goal Formulation: With patient Time For Goal Achievement: 02/26/19 Potential to Achieve Goals: Fair    Frequency Min 3X/week   Barriers to discharge Decreased caregiver support      Co-evaluation               AM-PAC PT "6 Clicks" Mobility  Outcome Measure Help needed turning from your back to your side while in a flat bed without using bedrails?: None Help needed moving from lying on your back to sitting on the side of a flat bed without using bedrails?: A Lot Help needed moving to and from a bed to a chair (including a wheelchair)?: A Little Help needed standing up from a chair using your arms (e.g., wheelchair or bedside chair)?: A Lot Help needed to walk in hospital room?: A Little Help needed climbing 3-5 steps with a railing? : A Lot 6 Click Score: 16    End of Session Equipment Utilized During Treatment: Gait belt Activity Tolerance: Patient tolerated treatment well Patient left: in chair;with call bell/phone within reach;with chair alarm set;with family/visitor present Nurse Communication: Mobility status PT Visit Diagnosis: Unsteadiness on feet (R26.81);History of falling (Z91.81);Difficulty in walking, not elsewhere classified (R26.2)    Time: 6226-3335 PT Time Calculation (min) (ACUTE ONLY): 23 min   Charges:   PT Evaluation $PT Eval Moderate Complexity: 1 Mod PT Treatments $Therapeutic Activity: 8-22 mins      Ellamae Sia, PT, DPT Acute Rehabilitation Services Pager [phone] Office [phone]  Willy Eddy 02/12/2019, 5:13 PM

## 2019-02-12 NOTE — Consult Note (Signed)
Hospital Consult    Reason for Consult:  TAAA Referring Physician:  Dr. Loleta Books MRN #:  [id number]  History of Present Illness: This is a 78 y.o. male history of CHF, chronic kidney disease, hypertension, hyperlipidemia, Parkinson's disease and dementia.  Had chest pain with elevated troponin.  Cardiology recommending medical management.  CT scan with concern for dissection demonstrated distal thoracic aortic aneurysm.  Patient is limited historian I have retrieved information from wife and chart.  Currently denies chest pain to me does not have any new back pain.  Per wife did not previously know about aneurysm.  Past Medical History:  Diagnosis Date  . Anxiety   . Cardiomyopathy ([place])   . Chronic combined systolic and diastolic CHF (congestive heart failure) ([place])   . Dementia ([place])   . GERD (gastroesophageal reflux disease)   . Hyperlipidemia   . Hypertension   . Incontinence   . 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  . Myocardial infarction ([place])   . Neuromuscular disorder ([place])   . Parkinson's disease ([place])   . Peripheral vascular disease [facility])     Past Surgical History:  Procedure Laterality Date  . BLADDER REPAIR     pt sts "when I had my prostatectomy they cut too much and I have an internal button I have to press in order to release my urine"  . BLADDER SURGERY     2010  pump placed   . CATARACT EXTRACTION W/PHACO  07/28/2011   Procedure: CATARACT EXTRACTION PHACO AND INTRAOCULAR LENS PLACEMENT (IOC);  Surgeon: Elta Guadeloupe T. Gershon Crane;  Location: [facility];  Service: Ophthalmology;  Laterality: Right;  CDE: 20.26  . CATARACT EXTRACTION W/PHACO  09/08/2011   Procedure: CATARACT EXTRACTION PHACO AND INTRAOCULAR LENS PLACEMENT (IOC);  Surgeon: Elta Guadeloupe T. Gershon Crane;  Location: [facility];  Service: Ophthalmology;  Laterality: Left;  CDE:37.31  . ENDARTERECTOMY Right 10/07/2016   Procedure: RIGHT ENDARTERECTOMY CAROTID WITH LIGATION OF INTERNAL CAROTID;  Surgeon: Serafina Mitchell, MD;  Location: [facility];  Service: Vascular;  Laterality: Right;  . PATCH ANGIOPLASTY Right 10/07/2016   Procedure: [place];  Surgeon: Serafina Mitchell, MD;  Location: [facility];  Service: Vascular;  Laterality: Right;  . PROSTATECTOMY    . TEE WITHOUT CARDIOVERSION N/A 06/10/2018   Procedure: TRANSESOPHAGEAL ECHOCARDIOGRAM (TEE);  Surgeon: Arnoldo Lenis, MD;  [facility];  Service: Endoscopy;  Laterality: N/A;  . YAG LASER APPLICATION  43/15/4008   Procedure: YAG LASER APPLICATION;  Surgeon: Elta Guadeloupe T. Gershon Crane, MD;  Location: [facility];  Service: Ophthalmology;  Laterality: Right;    No Known Allergies  Prior to Admission medications   Medication Sig Start Date End Date Taking? Authorizing Provider  acetaminophen (TYLENOL) 650 MG CR tablet Take 650-1,300 mg by mouth daily as needed for pain.    Yes [provider]  aspirin EC 81 MG tablet Take 81 mg by mouth daily.   Yes [provider]  carbidopa-levodopa (SINEMET) 25-100 MG per tablet Take 1 tablet by mouth 2 (two) times daily.    Yes [provider]  carvedilol (COREG) 12.5 MG tablet Take 1 tablet (12.5 mg total) by mouth 2 (two) times daily with a meal. 08/01/18  Yes Branch, Alphonse Guild, MD  donepezil (ARICEPT) 10 MG tablet Take 10 mg by mouth at bedtime.   Yes [provider]  DULoxetine (CYMBALTA) 60 MG capsule Take 60 mg by mouth at bedtime.  Yes [provider]  folic acid (FOLVITE) 1 MG tablet Take 1 mg by mouth at bedtime.    Yes [provider]  hydrOXYzine (ATARAX/VISTARIL) 25 MG tablet Take 25 mg by mouth 2 (two) times daily.    Yes [provider]  levothyroxine (SYNTHROID, LEVOTHROID) 88 MCG tablet Take 88 mcg by mouth daily before breakfast.   Yes [provider]  meloxicam (MOBIC) 15 MG tablet Take 1 tablet by mouth daily. 10/23/18  Yes [provider]  Multiple Vitamins-Minerals (MULTIVITAMIN  WITH MINERALS) tablet Take 1 tablet by mouth every morning.    Yes [provider]  oxybutynin (DITROPAN) 5 MG tablet Take 5 mg by mouth 2 (two) times daily. 05/07/15  Yes [provider]  rasagiline (AZILECT) 1 MG TABS tablet Take 1 tablet by mouth daily. 11/21/18  Yes [provider]  simvastatin (ZOCOR) 10 MG tablet Take 10 mg by mouth at bedtime.     Yes [provider]    Social History   Socioeconomic History  . Marital status: Married    Spouse name: Not on file  . Number of children: Not on file  . Years of education: Not on file  . Highest education level: Not on file  Occupational History  . Not on file  Social Needs  . Financial resource strain: Not on file  . Food insecurity:    Worry: Not on file    Inability: Not on file  . Transportation needs:    Medical: Not on file    Non-medical: Not on file  Tobacco Use  . Smoking status: Former Smoker    Packs/day: 0.50    Years: 10.00    Pack years: 5.00    Types: Cigarettes    Last attempt to quit: 07/24/1983    Years since quitting: 35.5  . Smokeless tobacco: Never Used  Substance and Sexual Activity  . Alcohol use: No  . Drug use: No  . Sexual activity: Never  Lifestyle  . Physical activity:    Days per week: Not on file    Minutes per session: Not on file  . Stress: Not on file  Relationships  . Social connections:    Talks on phone: Not on file    Gets together: Not on file    Attends religious service: Not on file    Active member of club or organization: Not on file    Attends meetings of clubs or organizations: Not on file    Relationship status: Not on file  . Intimate partner violence:    Fear of current or ex partner: Not on file    Emotionally abused: Not on file    Physically abused: Not on file    Forced sexual activity: Not on file  Other Topics Concern  . Not on file  Social History Narrative  . Not on file     Family History  Problem Relation Age of  Onset  . Cancer Mother   . Cancer Father   . Heart Problems Brother        has PPM  . Anesthesia problems Neg Hx   . Hypotension Neg Hx   . Malignant hyperthermia Neg Hx   . Pseudochol deficiency Neg Hx     ROS: Denies chest pain, review of systems somewhat limited given patient's confusion.  Physical Examination  Vitals:   02/12/19 0400 02/12/19 0800  BP: (!) 168/87 (!) 155/87  Pulse: 81 91  Resp: 19 15  Temp: 98.3 F (36.8 C)   SpO2: 95% 100%   Body mass index is 28.09 kg/m.  General: No acute distress Gait: Not observed HENT: WNL, normocephalic Pulmonary: normal non-labored breathing Cardiac: Palpable radial and common pulses bilaterally, bilateral popliteal artery pulses palpable and normal sized Abdomen:  soft, NT/ND, no masses Extremities: Extremities are well perfused Neurologic: Awake and alert, moving all extremities without limitation  CBC    Component Value Date/Time   WBC 6.6 02/12/2019 0253   RBC 2.90 (L) 02/12/2019 0253   HGB 9.1 (L) 02/12/2019 0253   HCT 27.1 (L) 02/12/2019 0253   PLT 185 02/12/2019 0253   MCV 93.4 02/12/2019 0253   MCH 31.4 02/12/2019 0253   MCHC 33.6 02/12/2019 0253   RDW 14.7 02/12/2019 0253   LYMPHSABS 1.4 06/08/2018 1840   MONOABS 0.7 06/08/2018 1840   EOSABS 0.1 06/08/2018 1840   BASOSABS 0.0 06/08/2018 1840    BMET    Component Value Date/Time   NA 139 02/12/2019 0253   K 4.0 02/12/2019 0253   CL 106 02/12/2019 0253   CO2 23 02/12/2019 0253   GLUCOSE 116 (H) 02/12/2019 0253   BUN 36 (H) 02/12/2019 0253   CREATININE 1.71 (H) 02/12/2019 0253   CALCIUM 8.6 (L) 02/12/2019 0253   GFRNONAA 38 (L) 02/12/2019 0253   GFRAA 44 (L) 02/12/2019 0253    COAGS: Lab Results  Component Value Date   INR 1.04 10/06/2016     Non-Invasive Vascular Imaging:   CTA angio  IMPRESSION: 1. Aneurysmal disease of the mid descending thoracic aorta with maximal diameter of 4.2-4.3 cm. This is associated with posterior wall  ulcerated plaque and mural thrombus. No evidence of acute aortic dissection or intramural hemorrhage. Recommend follow-up CTA or MRA of the thoracic aorta in 1 year. 2. Pulmonary edema with more significant alveolar edema in the right lung and associated small bilateral pleural effusions. 3. Cardiac enlargement and suggestion of possible left ventricular cavity dilatation. 4. Coronary atherosclerosis with calcified plaque in the distribution of the LAD and also likely the RCA. 5. Evidence of prior granulomatous disease with multiple calcified mediastinal and bilateral hilar lymph nodes. Calcified granulomata are also present in both lower lungs and the spleen. 6. Small hiatal hernia.  Small left inguinal hernia containing fat.  I have independently interpreted his CT scan which demonstrates mild dilatation of his distal thoracic aorta without any evidence of acute change from an arterial standpoint.  ASSESSMENT/PLAN: This is a 78 y.o. male with small distal thoracic aortic aneurysm.  I discussed finding with the wife that this is likely chronic.  We will plan for CT angios repeat in 1 year in the office.  If patient family are not going to pursue any therapy they can cancel this appointment.  I am available as needed.  Dianelly Ferran C. Donzetta Matters, MD [facility] Office: [phone] Pager: [phone]

## 2019-02-12 NOTE — Progress Notes (Signed)
PROGRESS NOTE    [name]  HQP:[id number] DOB: 1941-02-22 DOA: 02/09/2019 PCP: Celene Squibb, MD      Brief Narrative:  [name] is a 78 y.o. M with dementia, community dwelling, Parkinsons, HTN, CAD, PVD with CEA 2017, sCHF EF 40%, hx CVA no residual weakness, CKD III baseline 1.4, and anemia of chronic renal disease who presents with chest pain and few days cough.  In ER on arrival was hypoxic to 70s, CXR showed consolidation.  Lactic acid 2.2, BNP 1600, troponin 1.2 initially.  Admitted to [facility], started on empiric Abx for CAP, trend troponin, Lasix for CHF, IV heparin.  Troponin up to 25, transferred to [facility] for cath.        Assessment & Plan:  NSTEMI -Continue aspirin, atorvastatin, carvedilol -Continue heparin until mid-day today, then d/c after 48 hours -Plavix per Cardiology   Community Acquired pneumonia Acute hypoxic respiartory failure from CAP Blood cultures negative.  Taking orals, off O2, RR mostly normal, ambulating, mentating well.    -Continue ceftriaxone and azithromycin -PT eval and then transition to oral antibiotics and stable for d/c from standpoint of pneumonia tomorrow  Acute on chronic systolic and Diastolic CHF Still no dyspnea, orthopnea, swelling.  Appears euvolemic.  Off O2, asymtomtaic. -Follow echo -Hold further lasix  Dementia -Hold home donepezil for now  Parkinsons -Continue Sinemet and rasagaline  Acute on chronic anemia of renal insufficiency ANemia at baseline 10, from CKD.  Unclear drop in Hgb here, likely dilutional, phlebotomy, no clinical bleeding noted   Iron studies show AoCD and of renal insuffiency. -FOBT as outpatient -Transfusion threshold 8 g/dL given active coronary disease  CKD III Baseline Cr 1.6-1.8.  Stable in range today. -Trend Cr  Hypertension BP elevated -Continue carvedilol, dose increased  Hypothyroidism -Continue levothyroxine -Check TSH  Other medications -Hold duloxetine, ditropan        MDM and disposition: The below labs and imaging reports were reviewed and summarized above.  Med mgmt as above, including anticoagulation.   THe patient was admitted with PNA, found to have NSTEMI.  He is currently on heparin, will complete 48 hours midday today.  There are no revascularization options, will optimize therapy for CAD.  Re: pneumonia, we will transition to oral stomorrow morning, work with PT today and progress towards a safe dsicahrge plan.         Acute coronary syndrome (MI, NSTEMI, STEMI, etc) this admission?: Yes.     AHA/ACC Clinical Performance & Quality Measures: 1. Aspirin prescribed? - Yes 2. ADP Receptor Inhibitor (Plavix/Clopidogrel, Brilinta/Ticagrelor or Effient/Prasugrel) prescribed (includes medically managed patients)? - No - pending cardiology, concern for anemia 3. Beta Blocker prescribed? - Yes 4. High Intensity Statin (Lipitor 40-80mg  or Crestor 20-40mg ) prescribed? - Yes 5. EF assessed during THIS hospitalization? - Yes 6. For EF <40%, was ACEI/ARB prescribed? - pending 7. For EF <40%, Aldosterone Antagonist (Spironolactone or Eplerenone) prescribed? - pending 8. Cardiac Rehab Phase II ordered (Included Medically managed Patients)? - pending        DVT prophylaxis: N/A on heparin Code Status: UFLL Family Communication: None presnt    Consultants:   Hospitalist  Procedures:   Catheterization  RCA Prox RCA lesion is 50% stenosed. Prox RCA to Mid RCA lesion is 80% stenosed. Mid RCA lesion is 90% stenosed. Mid RCA to Dist RCA lesion is 100% stenosed.  ----- Part of the PDA and PL system was filled via right to right and left-to-right collaterals.  Cx-OM: Ost Cx to Prox  Cx lesion is 45% stenosed with 90% stenosed side branch in LAV Groove.  ------ 3rd LPL-1 lesion is 80% stenosed. 3rd LPL-2 lesion is 99% stenosed.  Prox LAD to Mid LAD lesion is 90% stenosed with 55% stenosed side branch in Ost 2nd Diag.  Mid-distal LAD to Dist  LAD lesion is 95% stenosed. Dist LAD lesion is 90% stenosed.  --  LV end diastolic pressure is moderately elevated. Consistent with acute on chronic diastolic heart failure   SUMMARY  Severe multivessel disease: 100% CTO of mid RCA, 50% proximal circumflex with 90% AV groove followed by 100% CTO of LPL (that recanalizes through left left lateral), 90 to 95% bifurcation LAD-2nd Diag followed by 60% and then tandem 99 to 90% lesions in the mid to distal LAD. -- >  Minimal good PCI or CABG options  Moderately elevated LVEDP consistent with acute diastolic heart failure  RECOMMENDATIONS  The patient will be transferred to a telemetry bed.  I plan to restart IV heparin to run for 48 hours for MI.  (Will restart 8 hours post sheath removal)  Continue aggressive medical management.  I discussed the findings with the patient's power of attorney/brother and sister-in-law:  Options going forward would be medical management, CABG, random PCI.  After discussing with the brother: ? It is clear that PCI is not likely a good option as it would not be a good candidate for dual antiplatelet therapy with frequent falls, he is also was anemic.  ? CABG would not be something that they would be interested in offering based on his dementia, renal insufficiency and Parkinson's.  Would not likely recover.   ? Best option going forward would be medical management.   With renal insufficiency, we will hydrate him, but he would likely require diuresis and standing diuretic during this hospitalization.  2D echo was ordered to better assess his EF, however I suspect it to be reduced.  Antimicrobials:   Vancomycin 2/27 >> 2/28  Zosyn 2/27 >> 2/28  Ceftriaxone 2/28 >> 3/1  Azithromycin 2/28 >>   Cefdinir 3/2 >>         Subjective: Has some left-sided precordial pain with palpation, no other chest pain.  No fever, sputum.  No more swelling.  No orthopnea.  No dyspnea.     Objective: Vitals:    02/11/19 2357 02/12/19 0400 02/12/19 0420 02/12/19 0800  BP: (!) 172/84 (!) 168/87  (!) 155/87  Pulse: 90 81  91  Resp: 19 19  15   Temp:  98.3 F (36.8 C)    TempSrc:  Oral    SpO2: 96% 95%  100%  Weight:   83.8 kg   Height:        Intake/Output Summary (Last 24 hours) at 02/12/2019 0910 Last data filed at 02/12/2019 0420 Gross per 24 hour  Intake 1246.15 ml  Output 2100 ml  Net -853.85 ml   Filed Weights   02/10/19 1200 02/11/19 0525 02/12/19 0420  Weight: 80.4 kg 84.5 kg 83.8 kg    Examination: General appearance: Older adult male, lying in bed, just finished echocardiogram, looking for his breakfast. HEENT: Anicteric, conjunctiva pink, lids and lashes normal. No nasal deformity, discharge, epistaxis.  Lips moist, dentures in place, no oral lesions, OP moist, hearing poor.   Skin: Warm and dry.  no jaundice.  No suspicious rashes or lesions. Cardiac: Regular rate and rhythm, no murmurs appreciated, JVP not visible, no lower extremity edema. Respiratory: Respiratory effort appears normal at rest, no wheezing.  No rales  abdomen: Soft, no tenderness to palpation, no ascites, distention, hepatosplenomegaly.   MSK: No deformities or effusions. Neuro: Awake and alert, extraocular movements intact, moves all extremities, moderate bradykinesia, more in the left, extraocular movements intact, speech fluent. Psych: Sensorium intact responding to questions, attention normal, affect normal, judgment and insight appear impaired by dementia.    Data Reviewed: I have personally reviewed following labs and imaging studies:  CBC: Recent Labs  Lab 02/09/19 2100 02/10/19 0547 02/11/19 0303 02/12/19 0253  WBC 11.1* 8.2 7.9 6.6  HGB 9.8* 8.6* 7.6* 9.1*  HCT 30.8* 27.1* 23.1* 27.1*  MCV 99.0 98.5 95.9 93.4  PLT 220 180 191 854   Basic Metabolic Panel: Recent Labs  Lab 02/09/19 2100 02/10/19 0547 02/11/19 0303 02/12/19 0253  NA 137 137 137 139  K 4.4 4.1 4.3 4.0  CL 105 103 104  106  CO2 21* 24 23 23   GLUCOSE 138* 146* 120* 116*  BUN 44* 43* 40* 36*  CREATININE 1.44* 1.44* 1.45* 1.71*  CALCIUM 9.1 8.8* 8.5* 8.6*   GFR: Estimated Creatinine Clearance: 38.8 mL/min (A) (by C-G formula based on SCr of 1.71 mg/dL (H)). Liver Function Tests: Recent Labs  Lab 02/10/19 0756 02/11/19 0303  AST 142* 91*  ALT 39 41  ALKPHOS 71 59  BILITOT 0.6 0.5  PROT 7.5 6.7  ALBUMIN 3.7 3.0*   No results for input(s): LIPASE, AMYLASE in the last 168 hours. No results for input(s): AMMONIA in the last 168 hours. Coagulation Profile: No results for input(s): INR, PROTIME in the last 168 hours. Cardiac Enzymes: Recent Labs  Lab 02/09/19 2100 02/09/19 2332 02/10/19 0547 02/10/19 0756  TROPONINI 1.22* 1.18* 25.89* 37.67*   BNP (last 3 results) No results for input(s): PROBNP in the last 8760 hours. HbA1C: No results for input(s): HGBA1C in the last 72 hours. CBG: Recent Labs  Lab 02/10/19 0916  GLUCAP 113*   Lipid Profile: Recent Labs    02/11/19 0303  CHOL 125  HDL 26*  LDLCALC 86  TRIG 65  CHOLHDL 4.8   Thyroid Function Tests: No results for input(s): TSH, T4TOTAL, FREET4, T3FREE, THYROIDAB in the last 72 hours. Anemia Panel: Recent Labs    02/10/19 0756 02/10/19 0947 02/10/19 0948  VITAMINB12  --   --  535  FOLATE  --  45.5  --   FERRITIN  --   --  202  TIBC  --   --  232*  IRON  --   --  20*  RETICCTPCT 1.3  --   --    Urine analysis:    Component Value Date/Time   COLORURINE STRAW (A) 02/09/2019 2123   APPEARANCEUR CLEAR 02/09/2019 2123   LABSPEC 1.008 02/09/2019 2123   PHURINE 6.0 02/09/2019 2123   GLUCOSEU NEGATIVE 02/09/2019 2123   HGBUR SMALL (A) 02/09/2019 2123   [place] NEGATIVE 02/09/2019 2123   [place] NEGATIVE 02/09/2019 2123   PROTEINUR 30 (A) 02/09/2019 2123   NITRITE NEGATIVE 02/09/2019 2123   LEUKOCYTESUR NEGATIVE 02/09/2019 2123   Sepsis Labs: @LABRCNTIP (procalcitonin:4,lacticacidven:4)  ) Recent Results  (from the past 240 hour(s))  Blood Culture (routine x 2)     Status: None (Preliminary result)   Collection Time: 02/09/19  9:34 PM  Result Value Ref Range Status   Specimen Description LEFT ANTECUBITAL  Final   Special Requests BOTTLES DRAWN AEROBIC AND ANAEROBIC  Final   Culture   Final    NO GROWTH 3 DAYS Performed at [facility],  [address], [place], [place] [address]    Report Status PENDING  Incomplete  Blood Culture (routine x 2)     Status: None (Preliminary result)   Collection Time: 02/09/19  9:36 PM  Result Value Ref Range Status   Specimen Description BLOOD LEFT HAND  Final   Special Requests   Final    BOTTLES DRAWN AEROBIC AND ANAEROBIC Blood Culture adequate volume   Culture   Final    NO GROWTH 3 DAYS Performed at [facility], [address], [place], [place] [address]    Report Status PENDING  Incomplete         Radiology Studies: Ct Angio Chest/abd/pel For Dissection W And/or W/wo  Result Date: 02/10/2019 CLINICAL DATA:  Acute chest pain, back pain and shortness of breath. Suspicion of acute aortic syndrome versus myocardial infarct. EXAM: CT ANGIOGRAPHY CHEST, ABDOMEN AND PELVIS TECHNIQUE: Multidetector CT imaging through the chest, abdomen and pelvis was performed using the standard protocol during bolus administration of intravenous contrast. Multiplanar reconstructed images and MIPs were obtained and reviewed to evaluate the vascular anatomy. CONTRAST:  158mL OMNIPAQUE IOHEXOL 350 MG/ML SOLN COMPARISON:  Chest x-ray on 02/09/2019 FINDINGS: CTA CHEST FINDINGS Cardiovascular: No evidence of acute aortic dissection or intramural hemorrhage. Proximal great vessels are normally patent. There are some calcifications at the level of the aortic valve without dilatation of the aortic root. The descending thoracic aorta demonstrates ulcerated plaque and mural thrombus with focal aneurysmal dilatation reaching maximal diameter 4.2-4.3 cm. The aorta measures 2.9 cm at the  diaphragmatic hiatus. The heart size is at the upper limits of normal/mildly enlarged. The left ventricular cavity may be mildly dilated. There is some calcified plaque in the distribution of the LAD and likely right coronary artery. Central pulmonary arteries are of normal caliber. Mediastinum/Nodes: Multiple calcified mediastinal and bilateral hilar lymph nodes are consistent with prior granulomatous disease. No enlarged lymph nodes are identified. There is a small hiatal hernia. Lungs/Pleura: Lungs demonstrate ground-glass airspace opacity predominantly in the upper lobes bilaterally, right greater than left as well as in the right lower lobe. Associated small bilateral pleural effusions. Findings are likely consistent with pulmonary edema. Calcified granulomata are present in both posterior lower lobes. No pneumothorax. Musculoskeletal: No chest wall abnormality. No acute or significant osseous findings. Review of the MIP images confirms the above findings. CTA ABDOMEN AND PELVIS FINDINGS VASCULAR Aorta: Scattered atherosclerotic plaque without evidence of aneurysm or dissection. Celiac: Mild 25-30% stenosis at the origin. Distal branch vessels are normally patent. SMA: Normally patent. Renals: Single right renal artery demonstrates normal patency. Left kidney is supplied by a single vessel which almost immediately bifurcates after its origin. This vessel is normally patent. IMA: Normally patent. Inflow: Bilateral iliac arteries demonstrate normal patency and moderate tortuosity. There is some atherosclerotic plaque in the common iliac arteries bilaterally without evidence of significant stenosis. Bilateral common femoral arteries and femoral bifurcations are widely patent. Review of the MIP images confirms the above findings. NON-VASCULAR Hepatobiliary: No focal liver abnormality is seen. No gallstones, gallbladder wall thickening, or biliary dilatation. Pancreas: Unremarkable. No pancreatic ductal dilatation  or surrounding inflammatory changes. Spleen: Normal in size with multiple calcified granulomata. Adrenals/Urinary Tract: Adrenal glands are unremarkable. Kidneys are normal, without renal calculi, focal lesion, or hydronephrosis. Bladder is unremarkable. Stomach/Bowel: Bowel shows no evidence of obstruction, ileus, inflammation or lesion. There is moderate stool in the rectum. No free air. Lymphatic: No enlarged lymph nodes identified. Reproductive: Status post prostatectomy. Penile prosthesis present with  reservoir indenting the dome of the bladder. Other: Small left inguinal hernia contains fat. No abscess or ascites identified. Musculoskeletal: No acute or significant osseous findings. Review of the MIP images confirms the above findings. IMPRESSION: 1. Aneurysmal disease of the mid descending thoracic aorta with maximal diameter of 4.2-4.3 cm. This is associated with posterior wall ulcerated plaque and mural thrombus. No evidence of acute aortic dissection or intramural hemorrhage. Recommend follow-up CTA or MRA of the thoracic aorta in 1 year. 2. Pulmonary edema with more significant alveolar edema in the right lung and associated small bilateral pleural effusions. 3. Cardiac enlargement and suggestion of possible left ventricular cavity dilatation. 4. Coronary atherosclerosis with calcified plaque in the distribution of the LAD and also likely the RCA. 5. Evidence of prior granulomatous disease with multiple calcified mediastinal and bilateral hilar lymph nodes. Calcified granulomata are also present in both lower lungs and the spleen. 6. Small hiatal hernia.  Small left inguinal hernia containing fat. Electronically Signed   By: Aletta Edouard M.D.   On: 02/10/2019 10:18        Scheduled Meds: . sodium chloride   Intravenous Once  . aspirin EC  81 mg Oral Daily  . atorvastatin  40 mg Oral q1800  . carbidopa-levodopa  1 tablet Oral BID  . carvedilol  12.5 mg Oral Once  . carvedilol  25 mg Oral  BID WC  . isosorbide mononitrate  30 mg Oral Daily  . levothyroxine  88 mcg Oral QAC breakfast  . rasagiline  1 mg Oral Daily  . sodium chloride flush  3 mL Intravenous Q12H  . sodium chloride flush  3 mL Intravenous Q12H  . sodium chloride flush  3 mL Intravenous Q12H   Continuous Infusions: . sodium chloride    . sodium chloride    . sodium chloride    . azithromycin 500 mg (02/11/19 1224)  . cefTRIAXone (ROCEPHIN)  IV 1 g (02/11/19 1135)  . heparin 1,350 Units/hr (02/12/19 0330)     LOS: 2 days    Time spent: 35 minutes    Edwin Dada, MD [facility] 02/12/2019, 9:10 AM     Please page through AMION:  [email or web address] If 7PM-7AM, please contact night-coverage

## 2019-02-12 NOTE — Progress Notes (Signed)
ANTICOAGULATION CONSULT NOTE  Pharmacy Consult for Heparin Indication: chest pain/ACS  No Known Allergies  Patient Measurements: Height: 5\' 8"  (172.7 cm) Weight: 186 lb 4.6 oz (84.5 kg) IBW/kg (Calculated) : 68.4 HEPARIN DW (KG): 72.6   Vital Signs: Temp: 97.9 F (36.6 C) (02/29 2005) Temp Source: Oral (02/29 2005) BP: 172/84 (02/29 2357) Pulse Rate: 90 (02/29 2357)  Labs: Recent Labs    02/09/19 2100 02/09/19 2332 02/10/19 0547 02/10/19 0756  02/11/19 0303 02/11/19 0953 02/11/19 2011 02/12/19 0253  HGB 9.8*  --  8.6*  --   --  7.6*  --   --  9.1*  HCT 30.8*  --  27.1*  --   --  23.1*  --   --  27.1*  PLT 220  --  180  --   --  191  --   --  185  HEPARINUNFRC  --   --   --   --    < >  --  0.15* 0.11* 0.33  CREATININE 1.44*  --  1.44*  --   --  1.45*  --   --   --   TROPONINI 1.22* 1.18* 25.89* 37.67*  --   --   --   --   --    < > = values in this interval not displayed.   Estimated Creatinine Clearance: 45.9 mL/min (A) (by C-G formula based on SCr of 1.45 mg/dL (H)).  Medical History: Past Medical History:  Diagnosis Date  . Anxiety   . Cardiomyopathy ([place])   . Chronic combined systolic and diastolic CHF (congestive heart failure) ([place])   . Dementia ([place])   . GERD (gastroesophageal reflux disease)   . Hyperlipidemia   . Hypertension   . Incontinence   . 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  . Myocardial infarction ([place])   . Neuromuscular disorder ([place])   . Parkinson's disease ([place])   . Peripheral vascular disease ([facility])     Medications:  Scheduled:  . sodium chloride   Intravenous Once  . aspirin EC  81 mg Oral Daily  . atorvastatin  40 mg Oral q1800  . carbidopa-levodopa  1 tablet Oral BID  . carvedilol  12.5 mg Oral Once  . carvedilol  25 mg Oral BID WC  . isosorbide mononitrate  30 mg Oral Daily  . levothyroxine  88 mcg Oral QAC breakfast  . rasagiline  1 mg Oral Daily  . sodium chloride flush  3 mL Intravenous Q12H  .  sodium chloride flush  3 mL Intravenous Q12H  . sodium chloride flush  3 mL Intravenous Q12H   Infusions:  . sodium chloride    . sodium chloride    . sodium chloride    . sodium chloride    . azithromycin 500 mg (02/11/19 1224)  . cefTRIAXone (ROCEPHIN)  IV 1 g (02/11/19 1135)  . heparin 1,350 Units/hr (02/12/19 0025)   Anti-infectives (From admission, onward)   Start     Dose/Rate Route Frequency Ordered Stop   02/10/19 1700  cefTRIAXone (ROCEPHIN) 1 g in sodium chloride 0.9 % 100 mL IVPB  Status:  Discontinued     1 g 200 mL/hr over 30 Minutes Intravenous Every 24 hours 02/10/19 1659 02/10/19 1701   02/10/19 1700  azithromycin (ZITHROMAX) 500 mg in sodium chloride 0.9 % 250 mL IVPB  Status:  Discontinued     500 mg 250 mL/hr over 60 Minutes Intravenous Every 24 hours 02/10/19 1659  02/10/19 1701   02/10/19 1200  cefTRIAXone (ROCEPHIN) 1 g in sodium chloride 0.9 % 100 mL IVPB     1 g 200 mL/hr over 30 Minutes Intravenous Every 24 hours 02/10/19 1149     02/10/19 1200  azithromycin (ZITHROMAX) 500 mg in sodium chloride 0.9 % 250 mL IVPB     500 mg 250 mL/hr over 60 Minutes Intravenous Every 24 hours 02/10/19 1149     02/10/19 0900  vancomycin (VANCOCIN) IVPB 750 mg/150 ml premix  Status:  Discontinued     750 mg 150 mL/hr over 60 Minutes Intravenous Every 12 hours 02/10/19 0800 02/10/19 1143   02/10/19 0815  piperacillin-tazobactam (ZOSYN) IVPB 3.375 g  Status:  Discontinued     3.375 g 12.5 mL/hr over 240 Minutes Intravenous Every 8 hours 02/10/19 0802 02/10/19 1143   02/09/19 2130  vancomycin (VANCOCIN) IVPB 1000 mg/200 mL premix     1,000 mg 200 mL/hr over 60 Minutes Intravenous  Once 02/09/19 2123 02/09/19 2240   02/09/19 2130  piperacillin-tazobactam (ZOSYN) IVPB 3.375 g     3.375 g 100 mL/hr over 30 Minutes Intravenous  Once 02/09/19 2123 02/09/19 2218      Assessment: 78 yo male with CP s/p cath with severe multivessel disease and plans are for heparin for 48hrs. He  is also noted with a  descending aortic aneurysm with associated mural thrombus. Pharmacy consulted to restart heparin s/p LHC on 2/28.   3/1 AM update: heparin level therapeutic x 1 after rate increase   Goal of Therapy:  Heparin level 0.3-0.7 units/ml    Plan:  -Cont heparin at 1350 units/hr -Confirmatory heparin level at [place], PharmD, [place] Pharmacist Phone: [phone]

## 2019-02-13 ENCOUNTER — Encounter (HOSPITAL_COMMUNITY): Payer: Self-pay | Admitting: Cardiology

## 2019-02-13 LAB — CBC
HCT: 25.2 % — ABNORMAL LOW (ref 39.0–52.0)
HEMOGLOBIN: 8.6 g/dL — AB (ref 13.0–17.0)
MCH: 32.3 pg (ref 26.0–34.0)
MCHC: 34.1 g/dL (ref 30.0–36.0)
MCV: 94.7 fL (ref 80.0–100.0)
Platelets: 187 10*3/uL (ref 150–400)
RBC: 2.66 MIL/uL — ABNORMAL LOW (ref 4.22–5.81)
RDW: 14.4 % (ref 11.5–15.5)
WBC: 6.5 10*3/uL (ref 4.0–10.5)
nRBC: 0 % (ref 0.0–0.2)

## 2019-02-13 LAB — BASIC METABOLIC PANEL
Anion gap: 9 (ref 5–15)
BUN: 36 mg/dL — ABNORMAL HIGH (ref 8–23)
CO2: 20 mmol/L — ABNORMAL LOW (ref 22–32)
Calcium: 8.9 mg/dL (ref 8.9–10.3)
Chloride: 109 mmol/L (ref 98–111)
Creatinine, Ser: 1.44 mg/dL — ABNORMAL HIGH (ref 0.61–1.24)
GFR calc Af Amer: 54 mL/min — ABNORMAL LOW (ref >60)
GFR calc non Af Amer: 47 mL/min — ABNORMAL LOW (ref >60)
Glucose, Bld: 107 mg/dL — ABNORMAL HIGH (ref 70–99)
Potassium: 4.5 mmol/L (ref 3.5–5.1)
Sodium: 138 mmol/L (ref 135–145)

## 2019-02-13 LAB — OCCULT BLOOD X 1 CARD TO LAB, STOOL: Fecal Occult Bld: POSITIVE — AB

## 2019-02-13 MED ORDER — ISOSORB DINITRATE-HYDRALAZINE 20-37.5 MG PO TABS
1.0000 | ORAL_TABLET | Freq: Three times a day (TID) | ORAL | Status: DC
  Administered 2019-02-13 – 2019-02-14 (×3): 1 via ORAL
  Filled 2019-02-13 (×3): qty 1

## 2019-02-13 MED FILL — Lidocaine HCl Local Preservative Free (PF) Inj 1%: INTRAMUSCULAR | Qty: 30 | Status: AC

## 2019-02-13 NOTE — Progress Notes (Signed)
PROGRESS NOTE    Wilian Long  OFB:[id number] DOB: 1941-06-13 DOA: 02/09/2019 PCP: Celene Squibb, MD      Brief Narrative:  [name] is a 78 y.o. M with dementia, community dwelling, Parkinsons, HTN, CAD, PVD with CEA 2017, sCHF EF 40%, hx CVA no residual weakness, CKD III baseline 1.4, and anemia of chronic renal disease who presents with chest pain and few days cough.  In ER on arrival was hypoxic to 70s, CXR showed consolidation.  Lactic acid 2.2, BNP 1600, troponin 1.2 initially.  Admitted to [facility], started on empiric Abx for CAP, trend troponin, Lasix for CHF, IV heparin.  Troponin up to 25, transferred to [facility] for cath.        Assessment & Plan:  NSTEMI Extensive multivessel disease.  PCI and CABG were discussed with family, but former both have poor risk/benefit ratios, and family have declined both. Received heparin 48 hours. Now chest pain free, asymptomatic with exertion. Appreciate medical optimization by Cardiology. -Continue aspirin, atorvastatin, carvedilol   Community Acquired pneumonia Acute hypoxic respiartory failure from CAP Blood cultures negative.  Patient now mentating at baseline, taking orals.  Temp < 100 F, heart rate < 100bpm, RR < 24, SpO2 at baseline.   Stable for discharge.  -Transition to Augmentin, azithromycin PO  Acute on chronic systolic and Diastolic CHF Net negative 1.4L, symptoms resovled.  EF 30-35%, reduced from previous.  Off O2, asymptomatic.   -Agree with low dose oral Lasix, started by Cardiology -Continue BiDil  Dementia -Resume donepezil at discharge  Parkinsons -Continue Sinemet and rasagaline  Acute on chronic anemia of renal insufficiency Anemia at baseline 10, from CKD.  Unclear drop in Hgb here, likely dilutional, phlebotomy, no clinical bleeding noted   Iron studies show AoCD and of renal insuffiency.  Did required 1 unit PRBCs yesterday post-cath, Hgb slightly down today, no clinical bleeding.  FOBT positive. -Outpatient  GI referral, needs to see Dr. Gala Romney at d/c -CBC in 1 week -Transfusion threshold 8 g/dL given coronary disease  CKD III Baseline Cr 1.6-1.8.  Stable in range today.  Hypertension BP elevated -Continue carvedilol, BiDil  Hypothyroidism TSH 8. Slightly up, suspect euthyroid sick. -Repeat TSH in 2 weeks -Continue levothyroxine  Other medications -Hold duloxetine, ditropan       MDM and disposition: The below labs and imaging reports were reviewed and summarized above.  Medication management as above  Patient was admitted with pneumonia, found to have NSTEMI.  He has completed 48 hours heparin, there are no revascularization options, and cardiology begin optimizing therapy for ASCVD and chronic systolic CHF  Re: pneumonia, as above he is stable for discharge, has been evaluated by physical therapy who recommended skilled nursing facility, 24-hour supervision, nursing care.  As we await insurance authorization, we will continue therapy for his pneumonia, and transition oral diuretics, trend his dropping Hgb.       Acute coronary syndrome (MI, NSTEMI, STEMI, etc) this admission?: Yes.     AHA/ACC Clinical Performance & Quality Measures: 1. Aspirin prescribed? - Yes 2. ADP Receptor Inhibitor (Plavix/Clopidogrel, Brilinta/Ticagrelor or Effient/Prasugrel) prescribed (includes medically managed patients)? - No - pending cardiology, concern for anemia 3. Beta Blocker prescribed? - Yes 4. High Intensity Statin (Lipitor 40-80mg  or Crestor 20-40mg ) prescribed? - Yes 5. EF assessed during THIS hospitalization? - Yes 6. For EF <40%, was ACEI/ARB prescribed? - deferred given renal insufficiency 7. For EF <40%, Aldosterone Antagonist (Spironolactone o Eplerenone) prescribed? - deferred given renal insufficiency 8. Cardiac Rehab Phase  II ordered (Included Medically managed Patients)? - no        DVT prophylaxis: SCDs Code Status: FULL Family Communication: None  presnt    Consultants:   Cardiology  Procedures:   Catheterization  RCA Prox RCA lesion is 50% stenosed. Prox RCA to Mid RCA lesion is 80% stenosed. Mid RCA lesion is 90% stenosed. Mid RCA to Dist RCA lesion is 100% stenosed.  ----- Part of the PDA and PL system was filled via right to right and left-to-right collaterals.  Cx-OM: Ost Cx to Prox Cx lesion is 45% stenosed with 90% stenosed side branch in LAV Groove.  ------ 3rd LPL-1 lesion is 80% stenosed. 3rd LPL-2 lesion is 99% stenosed.  Prox LAD to Mid LAD lesion is 90% stenosed with 55% stenosed side branch in Ost 2nd Diag.  Mid-distal LAD to Dist LAD lesion is 95% stenosed. Dist LAD lesion is 90% stenosed.  --  LV end diastolic pressure is moderately elevated. Consistent with acute on chronic diastolic heart failure   SUMMARY  Severe multivessel disease: 100% CTO of mid RCA, 50% proximal circumflex with 90% AV groove followed by 100% CTO of LPL (that recanalizes through left left lateral), 90 to 95% bifurcation LAD-2nd Diag followed by 60% and then tandem 99 to 90% lesions in the mid to distal LAD. -- >  Minimal good PCI or CABG options  Moderately elevated LVEDP consistent with acute diastolic heart failure  RECOMMENDATIONS  The patient will be transferred to a telemetry bed.  I plan to restart IV heparin to run for 48 hours for MI.  (Will restart 8 hours post sheath removal)  Continue aggressive medical management.  I discussed the findings with the patient's power of attorney/brother and sister-in-law:  Options going forward would be medical management, CABG, random PCI.  After discussing with the brother: ? It is clear that PCI is not likely a good option as it would not be a good candidate for dual antiplatelet therapy with frequent falls, he is also was anemic.  ? CABG would not be something that they would be interested in offering based on his dementia, renal insufficiency and Parkinson's.  Would not likely  recover.   ? Best option going forward would be medical management.   With renal insufficiency, we will hydrate him, but he would likely require diuresis and standing diuretic during this hospitalization.  2D echo was ordered to better assess his EF, however I suspect it to be reduced.  Antimicrobials:   Vancomycin 2/27 >> 2/28  Zosyn 2/27 >> 2/28  Ceftriaxone 2/28 >> 3/1  Azithromycin 2/28 >>   Cefdinir 3/2 >>         Subjective: No chest pain, fever, sputum, swelling, orthopnea, dyspnea.  Objective: Vitals:   02/13/19 0400 02/13/19 0516 02/13/19 0801 02/13/19 1313  BP: (!) 180/109  (!) 165/80 (!) 147/82  Pulse: 91  82 85  Resp: 20 (!) 24 18 18   Temp:   97.9 F (36.6 C) 99.3 F (37.4 C)  TempSrc:   Oral Oral  SpO2: 94%  98% 98%  Weight:  82.1 kg    Height:        Intake/Output Summary (Last 24 hours) at 02/13/2019 1504 Last data filed at 02/13/2019 0900 Gross per 24 hour  Intake 960 ml  Output 1000 ml  Net -40 ml   Filed Weights   02/11/19 0525 02/12/19 0420 02/13/19 0516  Weight: 84.5 kg 83.8 kg 82.1 kg    Examination: General appearance:  adult male, alert and in no acute distress.   HEENT: Anicteric, conjunctiva pink, lids and lashes normal. No nasal deformity, discharge, epistaxis.  Lips moist, dentures in place. OP moist, no oral lesions.   Skin: Warm and dry.  No suspicious rashes or lesions. Cardiac: RRR, no murmurs appreciated.  No LE edema.    Respiratory: Normal respiratory rate and rhythm.  CTAB without rales or wheezes. Abdomen: Abdomen soft.  No tenderness to palpation. No ascites, distension, hepatosplenomegaly.   MSK: No deformities or effusions of the large joints of the upper or lower extremities bilaterally. Neuro: Awake and alert. Naming is grossly intact, and he is interactive.  However the patient's recall, recent and remote, as well as general fund of knowledge seem moderately impaired by dementia.  Muscle tone normal, without  fasciculations.  Moves all extremities equally and with normal coordination.   Speech fluent.    Psych: Sensorium intact and responding to questions, attention normal. Affect pleasant.  Judgment and insight appear impaired.      Data Reviewed: I have personally reviewed following labs and imaging studies:  CBC: Recent Labs  Lab 02/09/19 2100 02/10/19 0547 02/11/19 0303 02/12/19 0253 02/13/19 0329  WBC 11.1* 8.2 7.9 6.6 6.5  HGB 9.8* 8.6* 7.6* 9.1* 8.6*  HCT 30.8* 27.1* 23.1* 27.1* 25.2*  MCV 99.0 98.5 95.9 93.4 94.7  PLT 220 180 191 185 654   Basic Metabolic Panel: Recent Labs  Lab 02/09/19 2100 02/10/19 0547 02/11/19 0303 02/12/19 0253 02/13/19 0329  NA 137 137 137 139 138  K 4.4 4.1 4.3 4.0 4.5  CL 105 103 104 106 109  CO2 21* 24 23 23  20*  GLUCOSE 138* 146* 120* 116* 107*  BUN 44* 43* 40* 36* 36*  CREATININE 1.44* 1.44* 1.45* 1.71* 1.44*  CALCIUM 9.1 8.8* 8.5* 8.6* 8.9   GFR: Estimated Creatinine Clearance: 45.6 mL/min (A) (by C-G formula based on SCr of 1.44 mg/dL (H)). Liver Function Tests: Recent Labs  Lab 02/10/19 0756 02/11/19 0303  AST 142* 91*  ALT 39 41  ALKPHOS 71 59  BILITOT 0.6 0.5  PROT 7.5 6.7  ALBUMIN 3.7 3.0*   No results for input(s): LIPASE, AMYLASE in the last 168 hours. No results for input(s): AMMONIA in the last 168 hours. Coagulation Profile: No results for input(s): INR, PROTIME in the last 168 hours. Cardiac Enzymes: Recent Labs  Lab 02/09/19 2100 02/09/19 2332 02/10/19 0547 02/10/19 0756  TROPONINI 1.22* 1.18* 25.89* 37.67*   BNP (last 3 results) No results for input(s): PROBNP in the last 8760 hours. HbA1C: No results for input(s): HGBA1C in the last 72 hours. CBG: Recent Labs  Lab 02/10/19 0916  GLUCAP 113*   Lipid Profile: Recent Labs    02/11/19 0303  CHOL 125  HDL 26*  LDLCALC 86  TRIG 65  CHOLHDL 4.8   Thyroid Function Tests: Recent Labs    02/10/19 1720  TSH 8.726*   Anemia Panel: No  results for input(s): VITAMINB12, FOLATE, FERRITIN, TIBC, IRON, RETICCTPCT in the last 72 hours. Urine analysis:    Component Value Date/Time   COLORURINE STRAW (A) 02/09/2019 2123   APPEARANCEUR CLEAR 02/09/2019 2123   LABSPEC 1.008 02/09/2019 2123   PHURINE 6.0 02/09/2019 2123   GLUCOSEU NEGATIVE 02/09/2019 2123   HGBUR SMALL (A) 02/09/2019 2123   [place] NEGATIVE 02/09/2019 2123   [place] NEGATIVE 02/09/2019 2123   PROTEINUR 30 (A) 02/09/2019 2123   NITRITE NEGATIVE 02/09/2019 2123   LEUKOCYTESUR NEGATIVE 02/09/2019 2123  Sepsis Labs: @LABRCNTIP (procalcitonin:4,lacticacidven:4)  ) Recent Results (from the past 240 hour(s))  Blood Culture (routine x 2)     Status: None (Preliminary result)   Collection Time: 02/09/19  9:34 PM  Result Value Ref Range Status   Specimen Description LEFT ANTECUBITAL  Final   Special Requests   Final    BOTTLES DRAWN AEROBIC AND ANAEROBIC Blood Culture adequate volume   Culture   Final    NO GROWTH 4 DAYS Performed at [facility], [address] [place], [place] [address]    Report Status PENDING  Incomplete  Blood Culture (routine x 2)     Status: None (Preliminary result)   Collection Time: 02/09/19  9:36 PM  Result Value Ref Range Status   Specimen Description BLOOD LEFT HAND  Final   Special Requests   Final    BOTTLES DRAWN AEROBIC AND ANAEROBIC Blood Culture adequate volume   Culture   Final    NO GROWTH 4 DAYS Performed at [facility], [address] [place],  [address]    Report Status PENDING  Incomplete         Radiology Studies: No results found.      Scheduled Meds: . sodium chloride   Intravenous Once  . amLODipine  5 mg Oral Daily  . aspirin EC  81 mg Oral Daily  . atorvastatin  40 mg Oral q1800  . azithromycin  250 mg Oral Daily  . carbidopa-levodopa  1 tablet Oral BID  . carvedilol  12.5 mg Oral Once  . carvedilol  25 mg Oral BID WC  . cefdinir  300 mg Oral Q12H  . enoxaparin (LOVENOX)  injection  40 mg Subcutaneous Q24H  . isosorbide-hydrALAZINE  1 tablet Oral TID  . levothyroxine  88 mcg Oral QAC breakfast  . rasagiline  1 mg Oral Daily  . sodium chloride flush  3 mL Intravenous Q12H  . sodium chloride flush  3 mL Intravenous Q12H  . sodium chloride flush  3 mL Intravenous Q12H   Continuous Infusions: . sodium chloride    . sodium chloride    . sodium chloride       LOS: 3 days    Time spent: 25 minutes    Edwin Dada, MD [facility] 02/13/2019, 3:04 PM     Please page through [place]:  [email or web address] Password [email or web address] If 7PM-7AM, please contact night-coverage

## 2019-02-13 NOTE — Plan of Care (Signed)
  Problem: Cardiovascular: Goal: Vascular access site(s) Level 0-1 will be maintained Outcome: Progressing   

## 2019-02-13 NOTE — Progress Notes (Signed)
Patient will go to [facility] once insurance authorization is approved.    Thurmond Butts, MSW, [place] Social Worker [phone]

## 2019-02-13 NOTE — NC FL2 (Signed)
[place] LEVEL OF CARE SCREENING TOOL     IDENTIFICATION  Patient Name: [name] Birthdate: 25-Dec-1940 Sex: male Admission Date (Current Location): 02/09/2019  [place] and [place] Number:  Herbalist and Address:  [facility] [place], [place] [address], [place], [place] [address]      Provider Number: [id number]  Attending Physician Name and Address:  Edwin Dada, *  Relative Name and Phone Number:  Rumi Taras [facility]), Brother, [place], Spouse, [phone]    Current Level of Care: Hospital Recommended Level of Care: [place] Prior Approval Number:    Date Approved/Denied: 02/13/19 PASRR Number: [id number]  Discharge Plan: SNF    Current Diagnoses: Patient Active Problem List   Diagnosis Date Noted  . Lobar pneumonia ([place]) 02/10/2019  . Acute on chronic respiratory failure with hypoxia ([place]) 02/10/2019  . NSTEMI (non-ST elevated myocardial infarction) ([place]) 02/10/2019  . Acute on chronic combined systolic and diastolic CHF (congestive heart failure) ([place]) 02/10/2019  . Non-STEMI (non-ST elevated myocardial infarction) ([place]) 02/10/2019  . Chest pain 02/09/2019  . Acute on chronic systolic CHF (congestive heart failure) ([place]) 02/09/2019  . Acute on chronic combined systolic and diastolic HF (heart failure) ([place])   . Dementia due to Parkinson's disease with behavioral disturbance ([place])   . Hyperlipidemia   . Essential hypertension   . CKD (chronic kidney disease) stage 3, GFR 30-59 ml/min (HCC)   . Elevated troponin   . Hypokalemia   . SOB (shortness of breath) 06/08/2018  . Symptomatic stenosis of right carotid artery 10/07/2016  . Preoperative cardiovascular examination 09/02/2016  . Abnormal EKG 09/02/2016  . Murmur 09/02/2016  . Carotid artery stenosis with cerebral infarction ([place]) 09/02/2016    Orientation RESPIRATION BLADDER Height & Weight     Self, Situation, Place   Normal Incontinent, External catheter Weight: 181 lb (82.1 kg) Height:  5\' 8"  (172.7 cm)  BEHAVIORAL SYMPTOMS/MOOD NEUROLOGICAL BOWEL NUTRITION STATUS      Incontinent Diet(Cardiac/Heart Healthy, thin liquids)  AMBULATORY STATUS COMMUNICATION OF NEEDS Skin   Supervision   Bruising, Skin abrasions(Abrasion on ankle, face, hand and bruise on thigh)                       Personal Care Assistance Level of Assistance  Bathing, Feeding, Dressing, Total care Bathing Assistance: Limited assistance Feeding assistance: Independent(Can feed self, needs assistance to help set up) Dressing Assistance: Limited assistance Total Care Assistance: Limited assistance   Functional Limitations Info  Sight, Hearing, Speech Sight Info: Adequate Hearing Info: Adequate Speech Info: Adequate    SPECIAL CARE FACTORS FREQUENCY  PT (By licensed PT), OT (By licensed OT)     PT Frequency: 5x/wk OT Frequency: 5x/wk            Contractures Contractures Info: Not present    Additional Factors Info  Code Status, Allergies Code Status Info: Full Code  Allergies Info: No known allergiese           Current Medications (02/13/2019):  This is the current hospital active medication list Current Facility-Administered Medications  Medication Dose Route Frequency Provider Last Rate Last Dose  . 0.9 %  sodium chloride infusion (Manually program via Guardrails IV Fluids)   Intravenous Once Danford, Suann Larry, MD      . 0.9 %  sodium chloride infusion  250 mL Intravenous PRN Leonie Man, MD      . 0.9 %  sodium  chloride infusion  250 mL Intravenous PRN Leonie Man, MD      . 0.9 %  sodium chloride infusion  250 mL Intravenous PRN Leonie Man, MD      . amLODipine (NORVASC) tablet 5 mg  5 mg Oral Daily Sueanne Margarita, MD   5 mg at 02/13/19 0954  . aspirin EC tablet 81 mg  81 mg Oral Daily Leonie Man, MD   81 mg at 02/13/19 1610  . atorvastatin (LIPITOR) tablet 40 mg  40 mg Oral q1800  Leonie Man, MD   40 mg at 02/12/19 1740  . azithromycin (ZITHROMAX) tablet 250 mg  250 mg Oral Daily Edwin Dada, MD   250 mg at 02/13/19 0953  . carbidopa-levodopa (SINEMET IR) 25-100 MG per tablet immediate release 1 tablet  1 tablet Oral BID Edwin Dada, MD   1 tablet at 02/13/19 0609  . carvedilol (COREG) tablet 12.5 mg  12.5 mg Oral Once Danford, Suann Larry, MD      . carvedilol (COREG) tablet 25 mg  25 mg Oral BID WC Edwin Dada, MD   25 mg at 02/13/19 0953  . cefdinir (OMNICEF) capsule 300 mg  300 mg Oral Q12H Danford, Suann Larry, MD   300 mg at 02/13/19 0953  . enoxaparin (LOVENOX) injection 40 mg  40 mg Subcutaneous Q24H Edwin Dada, MD   40 mg at 02/12/19 2012  . isosorbide-hydrALAZINE (BIDIL) 20-37.5 MG per tablet 1 tablet  1 tablet Oral TID Minus Breeding, MD      . levothyroxine (SYNTHROID, LEVOTHROID) tablet 88 mcg  88 mcg Oral QAC breakfast Edwin Dada, MD   88 mcg at 02/13/19 0609  . rasagiline (AZILECT) tablet 1 mg  1 mg Oral Daily Danford, Suann Larry, MD   1 mg at 02/13/19 0953  . sodium chloride flush (NS) 0.9 % injection 3 mL  3 mL Intravenous Q12H Leonie Man, MD   3 mL at 02/10/19 0027  . sodium chloride flush (NS) 0.9 % injection 3 mL  3 mL Intravenous PRN Leonie Man, MD      . sodium chloride flush (NS) 0.9 % injection 3 mL  3 mL Intravenous Q12H Leonie Man, MD   3 mL at 02/12/19 1406  . sodium chloride flush (NS) 0.9 % injection 3 mL  3 mL Intravenous PRN Leonie Man, MD      . sodium chloride flush (NS) 0.9 % injection 3 mL  3 mL Intravenous Q12H Leonie Man, MD   3 mL at 02/13/19 0956  . sodium chloride flush (NS) 0.9 % injection 3 mL  3 mL Intravenous PRN Leonie Man, MD   3 mL at 02/10/19 2111     Discharge Medications: Please see discharge summary for a list of discharge medications.  Relevant Imaging Results:  Relevant Lab Results:   Additional  Information SSI: [id number]  [place], LCSWA

## 2019-02-13 NOTE — Progress Notes (Addendum)
Progress Note  Patient Name: [name] Date of Encounter: 02/13/2019  Primary Cardiologist:   Carlyle Dolly, MD   Subjective   Says that he is feeling well.  Denies chest pain or SOB.   Inpatient Medications    Scheduled Meds: . sodium chloride   Intravenous Once  . amLODipine  5 mg Oral Daily  . aspirin EC  81 mg Oral Daily  . atorvastatin  40 mg Oral q1800  . azithromycin  250 mg Oral Daily  . carbidopa-levodopa  1 tablet Oral BID  . carvedilol  12.5 mg Oral Once  . carvedilol  25 mg Oral BID WC  . cefdinir  300 mg Oral Q12H  . enoxaparin (LOVENOX) injection  40 mg Subcutaneous Q24H  . isosorbide mononitrate  30 mg Oral Daily  . levothyroxine  88 mcg Oral QAC breakfast  . rasagiline  1 mg Oral Daily  . sodium chloride flush  3 mL Intravenous Q12H  . sodium chloride flush  3 mL Intravenous Q12H  . sodium chloride flush  3 mL Intravenous Q12H   Continuous Infusions: . sodium chloride    . sodium chloride    . sodium chloride     PRN Meds: sodium chloride, sodium chloride, sodium chloride, sodium chloride flush, sodium chloride flush, sodium chloride flush   Vital Signs    Vitals:   02/12/19 2341 02/13/19 0400 02/13/19 0516 02/13/19 0801  BP: (!) 159/68 (!) 180/109  (!) 165/80  Pulse: 87 91  82  Resp: 18 20 (!) 24 18  Temp: 97.8 F (36.6 C)   97.9 F (36.6 C)  TempSrc: Oral   Oral  SpO2: 96% 94%  98%  Weight:   82.1 kg   Height:        Intake/Output Summary (Last 24 hours) at 02/13/2019 1033 Last data filed at 02/13/2019 0515 Gross per 24 hour  Intake 720 ml  Output 1000 ml  Net -280 ml   Filed Weights   02/11/19 0525 02/12/19 0420 02/13/19 0516  Weight: 84.5 kg 83.8 kg 82.1 kg    Telemetry    NSR - Personally Reviewed  ECG    NA - Personally Reviewed  Physical Exam   GEN: No acute distress.   Neck: No  JVD Cardiac: RRR, no murmurs, rubs, or gallops.  Respiratory: Clear  to auscultation bilaterally. GI: Soft, nontender,  non-distended  MS:    No edema; No deformity. Neuro:  Nonfocal  Psych: Normal affect .  Baseline confusion.   Labs    Chemistry Recent Labs  Lab 02/10/19 0756 02/11/19 0303 02/12/19 0253 02/13/19 0329  NA  --  137 139 138  K  --  4.3 4.0 4.5  CL  --  104 106 109  CO2  --  23 23 20*  GLUCOSE  --  120* 116* 107*  BUN  --  40* 36* 36*  CREATININE  --  1.45* 1.71* 1.44*  CALCIUM  --  8.5* 8.6* 8.9  PROT 7.5 6.7  --   --   ALBUMIN 3.7 3.0*  --   --   AST 142* 91*  --   --   ALT 39 41  --   --   ALKPHOS 71 59  --   --   BILITOT 0.6 0.5  --   --   GFRNONAA  --  46* 38* 47*  GFRAA  --  54* 44* 54*  ANIONGAP  --  10 10 9  Hematology Recent Labs  Lab 02/11/19 0303 02/12/19 0253 02/13/19 0329  WBC 7.9 6.6 6.5  RBC 2.41* 2.90* 2.66*  HGB 7.6* 9.1* 8.6*  HCT 23.1* 27.1* 25.2*  MCV 95.9 93.4 94.7  MCH 31.5 31.4 32.3  MCHC 32.9 33.6 34.1  RDW 13.1 14.7 14.4  PLT 191 185 187    Cardiac Enzymes Recent Labs  Lab 02/09/19 2100 02/09/19 2332 02/10/19 0547 02/10/19 0756  TROPONINI 1.22* 1.18* 25.89* 37.67*   No results for input(s): TROPIPOC in the last 168 hours.   BNP Recent Labs  Lab 02/09/19 2100  BNP 1,647.0*     DDimer No results for input(s): DDIMER in the last 168 hours.   Radiology    No results found.  Cardiac Studies   Cardiac Cath 02/10/2019     RCA Prox RCA lesion is 50% stenosed. Prox RCA to Mid RCA lesion is 80% stenosed. Mid RCA lesion is 90% stenosed. Mid RCA to Dist RCA lesion is 100% stenosed.  ----- Part of the PDA and PL system was filled via right to right and left-to-right collaterals.  Cx-OM: Ost Cx to Prox Cx lesion is 45% stenosed with 90% stenosed side branch in LAV Groove.  ------ 3rd LPL-1 lesion is 80% stenosed. 3rd LPL-2 lesion is 99% stenosed.  Prox LAD to Mid LAD lesion is 90% stenosed with 55% stenosed side branch in Ost 2nd Diag.  Mid-distal LAD to Dist LAD lesion is 95% stenosed. Dist LAD lesion is 90%  stenosed.  --  LV end diastolic pressure is moderately elevated. Consistent with acute on chronic diastolic heart failure  SUMMARY  Severe multivessel disease: 100% CTO of mid RCA, 50% proximal circumflex with 90% AV groove followed by 100% CTO of LPL (that recanalizes through left left lateral), 90 to 95% bifurcation LAD-2nd Diag followed by 60% and then tandem 99 to 90% lesions in the mid to distal LAD. -- >Minimal good PCI or CABG options  Moderately elevated LVEDP consistent with acute diastolic heart failure    ECHO   02/12/19   1. The left ventricle has moderate-severely reduced systolic function, with an ejection fraction of 30-35%. The cavity size was normal. There is asymmetric septal hypertrophy. Left ventricular diastolic Doppler parameters are consistent with impaired  relaxation Elevated mean left atrial pressure.  2. The right ventricle has normal systolic function. The cavity was normal. There is no increase in right ventricular wall thickness.  3. The aortic valve has an indeterminant number of cusps Moderate thickening of the aortic valve Moderate calcification of the aortic valve. Aortic valve regurgitation is mild to moderate by color flow Doppler. mild stenosis of the aortic valve.  Moderate aortic annular calcification noted.  4. There is a mobile density within the MV apparatus that appears to be a rupture chordae. Mild thickening of the mitral valve leaflet. Mild calcification of the mitral valve leaflet. There is mild mitral annular calcification present. No evidence of  mitral valve stenosis.  5. The tricuspid valve is normal in structure.  6. The aortic root is normal in size and structure.  7. There is moderate dilatation of the aortic root measuring 41 mm.  8. Moderate hypokinesis of the left ventricular inferior wall.  9. Severe akinesis of the left ventricular anteroseptal wall. 10. Severe akinesis of the left ventricular apical segment.   Patient  Profile     78 y.o. male a hx of Dementia, chronic combined CHF, cardiomyopathy, ruptured MV chordae tendinae, CKD III, chronic anemia, anxiety, GERD,  HTN, HLD, Parkinson's disease,who is being seen for the evaluation NSTEMI  Assessment & Plan    NSTEMI:    Disease as above.  Plan medical management.    ACUTE SYSTOLIC AND DIASTOLIC HF:  EF as noted above yesterday was reduced compared to the previous.  Medical management.  Seems to be euvolemic.  Negative 1370 since admission.  Net negative slightly yesterday.  Lasix was held and creat is improved.  I am goint to start a low dose of Lasix PO as he will likely need this at home.  In addition I will change to Bidil for afterload reduction.     CKD III:   Creat is improved.  This will need to be followed closely.  Might be able to tolerate ARB in the future but I will defer given the recent rise in creat.    HTN:  This is being managed in the context of treating his CHF.  For now continue Norvasc although might wean down in favor of Bidil or Entresto in the future.   THORACIC AORTIC ANEURYSM:  Seen by Dr. Donzetta Matters.  Medical management with possible follow up imaging in one year.   He would be OK to discharge from my standpoint.  We will make sure that we have follow up in our clinic.     For questions or updates, please contact [place] Please consult [email or web address] for contact info under Cardiology/STEMI.   Signed, Minus Breeding, MD  02/13/2019, 10:33 AM

## 2019-02-13 NOTE — Care Management Important Message (Signed)
Important Message  Patient Details  Name: [name] MRN: [id number] Date of Birth: July 20, 1941   Medicare Important Message Given:  Yes    Lakethia Coppess P Novah Goza 02/13/2019, 1:41 PM

## 2019-02-13 NOTE — Evaluation (Signed)
Occupational Therapy Evaluation Patient Details Name: [name] MRN: [id number] DOB: [birth year] Today's Date: 02/13/2019    History of Present Illness Pt is a 78 y.o. M with significant PMH of CHF, CKD, hypertension, Parkinon's disease, and dementia. Admitted with chest pain with elevated troponin. Cardiac cath showing severe multivessel disease and cardiology recommending medical management.   Clinical Impression   Pt reports that he lives with his wife and grandchild and was performing ADLs and using rollator. Unsure of reliability due to baseline dementia, RN confirms that pt does live with his wife. Pt currently requiring Min A for LB ADLs and functional mobility with RW. Pt presenting with poor activity tolerance, strength, and balance. Pt presenting with heavy breathing during mobility. However, pt denies SOB and SpO2 >90% on RA. Pt would benefit from further acute OT to facilitate safe dc. Recommend dc to SNF for further OT to optimize safety, independence with ADLs, and return to PLOF.      Follow Up Recommendations  SNF;Supervision/Assistance - 24 hour    Equipment Recommendations  None recommended by OT    Recommendations for Other Services PT consult     Precautions / Restrictions Precautions Precautions: Fall Restrictions Weight Bearing Restrictions: No      Mobility Bed Mobility Overal bed mobility: Needs Assistance Bed Mobility: Supine to Sit     Supine to sit: HOB elevated;Supervision     General bed mobility comments: supervision for safety  Transfers Overall transfer level: Needs assistance Equipment used: Rolling walker (2 wheeled) Transfers: Sit to/from Stand Sit to Stand: Min assist         General transfer comment: Min A for gaining balance once in standing. Cues for hand placement    Balance Overall balance assessment: Needs assistance Sitting-balance support: Feet supported Sitting balance-Leahy Scale: Fair     Standing balance  support: Bilateral upper extremity supported Standing balance-Leahy Scale: Poor                             ADL either performed or assessed with clinical judgement   ADL Overall ADL's : Needs assistance/impaired Eating/Feeding: Set up;Supervision/ safety;Sitting   Grooming: Set up;Supervision/safety;Sitting   Upper Body Bathing: Min guard;Sitting   Lower Body Bathing: Minimal assistance;Sit to/from stand   Upper Body Dressing : Min guard;Sitting   Lower Body Dressing: Minimal assistance;Sit to/from stand Lower Body Dressing Details (indicate cue type and reason): Pt able to bend forward to adjust socks. Requires increased time and effort Toilet Transfer: Minimal assistance;Ambulation;RW(simulated to recliner)           Functional mobility during ADLs: Minimal assistance;Rolling walker General ADL Comments: Pt presenting with decreased strength, balance, and activity tolerance. Pt following cues well and able to maintain conversation. very pleasant and agreeable to participate in therapy     Vision         Perception     Praxis      Pertinent Vitals/Pain Pain Assessment: No/denies pain     Hand Dominance Right   Extremity/Trunk Assessment Upper Extremity Assessment Upper Extremity Assessment: Generalized weakness   Lower Extremity Assessment Lower Extremity Assessment: Generalized weakness   Cervical / Trunk Assessment Cervical / Trunk Assessment: Kyphotic   Communication Communication Communication: No difficulties   Cognition Arousal/Alertness: Awake/alert Behavior During Therapy: WFL for tasks assessed/performed Overall Cognitive Status: History of cognitive impairments - at baseline  General Comments: Baseline dementia. Decreased awareness.    General Comments  VSS throughout. HR elevatingto 90s during activity. SpO2 stable on RA, Noting some SOB during mobility.     Exercises     Shoulder  Instructions      Home Living Family/patient expects to be discharged to:: Private residence Living Arrangements: Spouse/significant other(Grandchild) Available Help at Discharge: Family;Available 24 hours/day Type of Home: House       Home Layout: Two level               Home Equipment: Walker - 4 wheels   Additional Comments: Pt reports that he lives with his wife and grandchild. Unsure of reliability due to baseline dementia.      Prior Functioning/Environment Level of Independence: Independent with assistive device(s)        Comments: Pt reports he uses rollator.         OT Problem List: Decreased strength;Decreased range of motion;Impaired balance (sitting and/or standing);Decreased activity tolerance;Decreased knowledge of use of DME or AE;Decreased knowledge of precautions      OT Treatment/Interventions: Self-care/ADL training;Therapeutic exercise;Energy conservation;DME and/or AE instruction;Therapeutic activities;Patient/family education    OT Goals(Current goals can be found in the care plan section) Acute Rehab OT Goals Patient Stated Goal: "go home." OT Goal Formulation: With patient Time For Goal Achievement: 02/27/19 Potential to Achieve Goals: Good  OT Frequency: Min 2X/week   Barriers to D/C:            Co-evaluation              AM-PAC OT "6 Clicks" Daily Activity     Outcome Measure Help from another person eating meals?: None Help from another person taking care of personal grooming?: A Little Help from another person toileting, which includes using toliet, bedpan, or urinal?: A Little Help from another person bathing (including washing, rinsing, drying)?: A Little Help from another person to put on and taking off regular upper body clothing?: A Little Help from another person to put on and taking off regular lower body clothing?: A Little 6 Click Score: 19   End of Session Equipment Utilized During Treatment: Gait belt;Rolling  walker Nurse Communication: Mobility status  Activity Tolerance: Patient tolerated treatment well Patient left: in chair;with call bell/phone within reach;with chair alarm set  OT Visit Diagnosis: Unsteadiness on feet (R26.81);Other abnormalities of gait and mobility (R26.89);Muscle weakness (generalized) (M62.81);History of falling (Z91.81);Other symptoms and signs involving cognitive function                Time: 1410-1430 OT Time Calculation (min): 20 min Charges:  OT General Charges $OT Visit: 1 Visit OT Evaluation $OT Eval Moderate Complexity: [place], OTR/L Acute Rehab Pager: [phone] Office: [place] 02/13/2019, 2:43 PM

## 2019-02-13 NOTE — Clinical Social Work Note (Addendum)
Clinical Social Work Assessment  Patient Details  Name: [name] MRN: [id number] Date of Birth: 09/05/1941  Date of referral:  02/13/19               Reason for consult:  Facility Placement                Permission sought to share information with:  Case Manager, Customer service manager, Family Supports Permission granted to share information::  Yes, Verbal Permission Granted  Name::     Keyden Pavlov- spouse and Joriel Streety Brother & POA  Agency::  [facility]  Relationship::  spouse and brother(POA)  Contact Information:  [phone]) and Paul([phone]  Housing/Transportation Living arrangements for the past 2 months:  [place] of Information:  Power of [place], Spouse, Other (Comment Required)(sister -in-law) Patient Interpreter Needed:  None Criminal Activity/Legal Involvement Pertinent to Current Situation/Hospitalization:  No - Comment as needed Significant Relationships:  Siblings, Spouse, Other Family Members Lives with:  Spouse, Adult Children Do you feel safe going back to the place where you live?  No Need for family participation in patient care:  Yes (Comment)  Care giving concerns:  CSW received consult for discharge needs. CSW spoke with patient's brother, Paul(POA), and his spouse regarding PT recommendation of SNF placement at time of discharge.  Patient's wife states she is disabled and is caring for a disabled son in the home. She wants the patient to receive short term rehab and get stronger before returning home. They have a two story home but remain on the first level of the home.     Social Worker assessment / plan:  CSW spoke with patient's family  concerning possibility of rehab at [facility] before returning home.   Employment status:  Disabled (Comment on whether or not currently receiving Disability) Insurance information:  Managed Medicare PT Recommendations:  [place] / Referral to community  resources:  [place]  Patient/Family's Response to care:  Patient's family  recognizes need for rehab before returning home and is agreeable to a SNF placement. Patient reported preference for [facility].    Patient/Family's Understanding of and Emotional Response to Diagnosis, Current Treatment, and Prognosis:  Patient's family is realistic regarding therapy needs and expressed being hopeful for SNF placement in the [place] area. Patient's family  expressed understanding of CSW role and discharge process as well as medical condition. No questions/concerns about plan or treatment at this time.   Emotional Assessment Appearance:  Appears stated age Attitude/Demeanor/Rapport:  Suspicious, Engaged Affect (typically observed):  Appropriate Orientation:  Oriented to Self, Oriented to Place, Oriented to  Time Alcohol / Substance use:  Not Applicable Psych involvement (Current and /or in the community):  No (Comment)  Discharge Needs  Concerns to be addressed:  Care Coordination, Basic Needs Readmission within the last 30 days:  No Current discharge risk:  Dependent with Mobility Barriers to Discharge:  Continued Medical Work up   [employer], [place] 02/13/2019, 12:20 PM

## 2019-02-14 DIAGNOSIS — N183 Chronic kidney disease, stage 3 (moderate): Secondary | ICD-10-CM | POA: Diagnosis not present

## 2019-02-14 DIAGNOSIS — G20 Parkinson's disease: Secondary | ICD-10-CM | POA: Diagnosis not present

## 2019-02-14 DIAGNOSIS — F419 Anxiety disorder, unspecified: Secondary | ICD-10-CM | POA: Diagnosis not present

## 2019-02-14 DIAGNOSIS — I5043 Acute on chronic combined systolic (congestive) and diastolic (congestive) heart failure: Secondary | ICD-10-CM | POA: Diagnosis not present

## 2019-02-14 DIAGNOSIS — R41841 Cognitive communication deficit: Secondary | ICD-10-CM | POA: Diagnosis not present

## 2019-02-14 DIAGNOSIS — I712 Thoracic aortic aneurysm, without rupture: Secondary | ICD-10-CM | POA: Diagnosis not present

## 2019-02-14 DIAGNOSIS — F339 Major depressive disorder, recurrent, unspecified: Secondary | ICD-10-CM | POA: Diagnosis not present

## 2019-02-14 DIAGNOSIS — F0280 Dementia in other diseases classified elsewhere without behavioral disturbance: Secondary | ICD-10-CM | POA: Diagnosis not present

## 2019-02-14 DIAGNOSIS — F0281 Dementia in other diseases classified elsewhere with behavioral disturbance: Secondary | ICD-10-CM | POA: Diagnosis not present

## 2019-02-14 DIAGNOSIS — I10 Essential (primary) hypertension: Secondary | ICD-10-CM | POA: Diagnosis not present

## 2019-02-14 DIAGNOSIS — F0391 Unspecified dementia with behavioral disturbance: Secondary | ICD-10-CM | POA: Diagnosis not present

## 2019-02-14 DIAGNOSIS — I130 Hypertensive heart and chronic kidney disease with heart failure and stage 1 through stage 4 chronic kidney disease, or unspecified chronic kidney disease: Secondary | ICD-10-CM | POA: Diagnosis not present

## 2019-02-14 DIAGNOSIS — I214 Non-ST elevation (NSTEMI) myocardial infarction: Secondary | ICD-10-CM | POA: Diagnosis not present

## 2019-02-14 DIAGNOSIS — I2510 Atherosclerotic heart disease of native coronary artery without angina pectoris: Secondary | ICD-10-CM | POA: Diagnosis not present

## 2019-02-14 DIAGNOSIS — R2689 Other abnormalities of gait and mobility: Secondary | ICD-10-CM | POA: Diagnosis not present

## 2019-02-14 DIAGNOSIS — Z743 Need for continuous supervision: Secondary | ICD-10-CM | POA: Diagnosis not present

## 2019-02-14 DIAGNOSIS — R279 Unspecified lack of coordination: Secondary | ICD-10-CM | POA: Diagnosis not present

## 2019-02-14 DIAGNOSIS — I5023 Acute on chronic systolic (congestive) heart failure: Secondary | ICD-10-CM | POA: Diagnosis not present

## 2019-02-14 DIAGNOSIS — J189 Pneumonia, unspecified organism: Secondary | ICD-10-CM | POA: Diagnosis not present

## 2019-02-14 DIAGNOSIS — J9621 Acute and chronic respiratory failure with hypoxia: Secondary | ICD-10-CM | POA: Diagnosis not present

## 2019-02-14 DIAGNOSIS — J181 Lobar pneumonia, unspecified organism: Secondary | ICD-10-CM | POA: Diagnosis not present

## 2019-02-14 DIAGNOSIS — J168 Pneumonia due to other specified infectious organisms: Secondary | ICD-10-CM | POA: Diagnosis not present

## 2019-02-14 DIAGNOSIS — M6281 Muscle weakness (generalized): Secondary | ICD-10-CM | POA: Diagnosis not present

## 2019-02-14 DIAGNOSIS — J9601 Acute respiratory failure with hypoxia: Secondary | ICD-10-CM | POA: Diagnosis not present

## 2019-02-14 DIAGNOSIS — Z9181 History of falling: Secondary | ICD-10-CM | POA: Diagnosis not present

## 2019-02-14 LAB — BASIC METABOLIC PANEL
Anion gap: 9 (ref 5–15)
BUN: 35 mg/dL — AB (ref 8–23)
CO2: 17 mmol/L — ABNORMAL LOW (ref 22–32)
Calcium: 8.9 mg/dL (ref 8.9–10.3)
Chloride: 110 mmol/L (ref 98–111)
Creatinine, Ser: 1.31 mg/dL — ABNORMAL HIGH (ref 0.61–1.24)
GFR calc Af Amer: >60 mL/min (ref >60)
GFR, EST NON AFRICAN AMERICAN: 53 mL/min — AB (ref >60)
Glucose, Bld: 103 mg/dL — ABNORMAL HIGH (ref 70–99)
POTASSIUM: 4.3 mmol/L (ref 3.5–5.1)
SODIUM: 136 mmol/L (ref 135–145)

## 2019-02-14 LAB — CULTURE, BLOOD (ROUTINE X 2)
CULTURE: NO GROWTH
Culture: NO GROWTH
Special Requests: ADEQUATE
Special Requests: ADEQUATE

## 2019-02-14 LAB — CBC
HCT: 24.6 % — ABNORMAL LOW (ref 39.0–52.0)
Hemoglobin: 8.3 g/dL — ABNORMAL LOW (ref 13.0–17.0)
MCH: 31.7 pg (ref 26.0–34.0)
MCHC: 33.7 g/dL (ref 30.0–36.0)
MCV: 93.9 fL (ref 80.0–100.0)
Platelets: 201 10*3/uL (ref 150–400)
RBC: 2.62 MIL/uL — ABNORMAL LOW (ref 4.22–5.81)
RDW: 13.9 % (ref 11.5–15.5)
WBC: 6.2 10*3/uL (ref 4.0–10.5)
nRBC: 0 % (ref 0.0–0.2)

## 2019-02-14 MED ORDER — DOCUSATE SODIUM 100 MG PO CAPS: 100.0000 mg | ORAL_CAPSULE | Freq: Two times a day (BID) | ORAL | 0 refills | Status: DC

## 2019-02-14 MED ORDER — CARVEDILOL 25 MG PO TABS: 25.0000 mg | ORAL_TABLET | Freq: Two times a day (BID) | ORAL | 0 refills | Status: DC

## 2019-02-14 MED ORDER — FERROUS SULFATE 325 (65 FE) MG PO TABS: 325.0000 mg | ORAL_TABLET | Freq: Two times a day (BID) | ORAL | 0 refills | Status: DC

## 2019-02-14 MED ORDER — AMLODIPINE BESYLATE 5 MG PO TABS: 5.0000 mg | ORAL_TABLET | Freq: Every day | ORAL | 0 refills | Status: DC

## 2019-02-14 MED ORDER — DOCUSATE SODIUM 100 MG PO CAPS
100.0000 mg | ORAL_CAPSULE | Freq: Every day | ORAL | Status: DC
  Administered 2019-02-14: 100 mg via ORAL
  Filled 2019-02-14: qty 1

## 2019-02-14 MED ORDER — ATORVASTATIN CALCIUM 40 MG PO TABS: 40.0000 mg | ORAL_TABLET | Freq: Every day | ORAL | 0 refills | Status: DC

## 2019-02-14 MED ORDER — FERROUS SULFATE 325 (65 FE) MG PO TABS
325.0000 mg | ORAL_TABLET | Freq: Two times a day (BID) | ORAL | Status: DC
  Administered 2019-02-14: 325 mg via ORAL
  Filled 2019-02-14: qty 1

## 2019-02-14 MED ORDER — ISOSORB DINITRATE-HYDRALAZINE 20-37.5 MG PO TABS: 1.0000 | ORAL_TABLET | Freq: Three times a day (TID) | ORAL | 0 refills | Status: DC

## 2019-02-14 MED ORDER — CEFDINIR 300 MG PO CAPS: 300.0000 mg | ORAL_CAPSULE | Freq: Two times a day (BID) | ORAL | 0 refills | Status: DC

## 2019-02-14 NOTE — H&P (Signed)
Patient being discharged to [facility] facility. Report called to [place]. IV removed catheter intact. Family aware of discharge. Patient AAO3-4 with slight forgetfulness but very pleasant. No c/o pain or discomfort.  Personal belongings packed by family. Telemetry removed to be transported via [facility].

## 2019-02-14 NOTE — Progress Notes (Signed)
Patient will DC to: [facility]  DC Date: 02/14/2019 Family Notified: [name], brother  Transport ER:[id number]  RN, patient, and facility notified of DC. Discharge Summary sent to facility. RN given number for report [phone]. Ambulance transport requested for patient.   Clinical Social Worker signing off.  Thurmond Butts, MSW, [place] Social Worker [phone]

## 2019-02-14 NOTE — Discharge Summary (Signed)
Physician Discharge Summary  Azam Gervasi KXF:[id number] DOB: 1941-06-22 DOA: 02/09/2019  PCP: Celene Squibb, MD  Admit date: 02/09/2019 Discharge date: 02/14/2019  Admitted From: Home  Disposition:  SNF   Recommendations for Outpatient Follow-up:  1. Please obtain CBC and BMP in 1 week 2. Please assist patient to follow up with Dr. Gala Romney from GI in 2-3 weeks 3. Follow up with Cardiology as directed in discharge AVS 4. Obtain TSH in 2 weeks      Home Health: N/A  Equipment/Devices: TBD at SNF  Discharge Condition: Fair  CODE STATUS: FULL Diet recommendation: Cardiac  Brief/Interim Summary: Mr. Fabro is a 78 y.o. M with dementia, community dwelling, Parkinsons, HTN, CAD, PVD with CEA 2017, sCHF EF 40%, hx CVA no residual weakness, CKD III baseline 1.4, and anemia of chronic renal disease who presents with chest pain and few days cough.    In ER on arrival was hypoxic to 70s, CXR showed consolidation.  Lactic acid 2.2, BNP 1600, troponin 1.2 initially.  Admitted to [facility], started on empiric Abx for CAP, Lasix for CHF, IV heparin for suspected ACS.  Troponin up to 25, transferred to [facility] for cath.         PRINCIPAL HOSPITAL DIAGNOSIS: Community acquired pneumonia, complicated by NSTEMI    Discharge Diagnoses:  NSTEMI Troponin trended up to 36 ng/mL.  Heparin started and continued 48 hours.    Transferred to [facility] for LHC which showed extensive multivessel disease.  PCI and CABG were discussed with family, but both options offer very poor risk/benefit ratios, and family declined both.   Chest pain resolved completely, asymptomatic with exertion. Cardiology optimized medications with up-titration of Coreg, initiation of BiDil, and augmentation of statin.     Community Acquired pneumonia Acute hypoxic respiartory failure from CAP Blood cultures negative.  CTA showed no PE but did show bilateral GGO, consistent with pneumonia or edema.   Treated with ceftriaxone and  azithromycin.  Patient mentating at baseline, taking orals.  Temp < 100 F, heart rate < 100bpm, RR < 24, SpO2 at baseline.   Stable for discharge. Transition to Cefdinir for 3 more days at discharge.  Completed 5 days azithromycin.  Acute on chronic systolic and Diastolic CHF BNP >6967 pg/mL.  Symptoms resovled.  Echocardiogram obtained and EF 30-35%, reduced from previous.  Off O2, asymptomatic.  Defer ACEi/Entresto and spironolactone to outpatient Cardiologist, pending renal function.  Dementia -Resume donepezil at discharge  Parkinsons Continued Sinemet and rasagaline.    Acute on chronic anemia of renal insufficiency Anemia at baseline 10, from CKD.  Drop in Hgb here, likely dilutional, phlebotomy.  No clinical bleeding noted at any time.  FOBT+, suggesting some degree of GI blood loss, consistent with iron saturation <10%.    Did require 1 unit PRBCs post-cath, started iron.  Requires daily iron, close monitoring of Hgb.  GI follow up in short term, discussed with family.    CKD III Baseline Cr 1.6-1.8.    Hypertension  Hypothyroidism TSH 8. Slightly up, suspect euthyroid sick.            Discharge Instructions  Discharge Instructions    Amb Referral to Cardiac Rehabilitation   Complete by:  As directed    Diagnosis:  NSTEMI   Diet - low sodium heart healthy   Complete by:  As directed    Discharge instructions   Complete by:  As directed    From Dr. Loleta Books: You were admitted with pneumonia to [facility], and  found simultaneously to have a heart attack.  For the heart attack: You were started on a blood thinner to stabilize the heart, and then underwent a "heart cath" to see where the blockage was. The heart cath showed multiple blockages, which can't be safely opened, but nothing new or active or immediately urgent.  Your treatment included several changes to your heart medicines: Increase your carvedilol dose (see below) Start the cholesterol  medicine atorvastatin, (Stop simvastatin) Start the blood pressure medicine amlodipine Start the heart medicine BiDil   For your pneumonia, you were treated with 5 days of antibiotics.   You should finish with cefdinir/Omnicef 300 mg twice daily for the next few days until gone    LASTLY: Important, you were anemic here. To correct the anemia, take iron supplements (ferrous sulfate 325 mg) twice daily  Take a stool softener Important: call Dr. Roseanne Kaufman office (your brother's gastroenterologist) for a follow up appointment in 2-3 weeks Have your nursing home check your labs in 1 week   Increase activity slowly   Complete by:  As directed      Allergies as of 02/14/2019   No Known Allergies     Medication List    STOP taking these medications   meloxicam 15 MG tablet Commonly known as:  MOBIC   simvastatin 10 MG tablet Commonly known as:  ZOCOR     TAKE these medications   acetaminophen 650 MG CR tablet Commonly known as:  TYLENOL Take 650-1,300 mg by mouth daily as needed for pain.   amLODipine 5 MG tablet Commonly known as:  NORVASC Take 1 tablet (5 mg total) by mouth daily. Start taking on:  February 15, 2019   aspirin EC 81 MG tablet Take 81 mg by mouth daily.   atorvastatin 40 MG tablet Commonly known as:  LIPITOR Take 1 tablet (40 mg total) by mouth daily at 6 PM.   carbidopa-levodopa 25-100 MG tablet Commonly known as:  SINEMET IR Take 1 tablet by mouth 2 (two) times daily.   carvedilol 25 MG tablet Commonly known as:  COREG Take 1 tablet (25 mg total) by mouth 2 (two) times daily with a meal. What changed:    medication strength  how much to take   cefdinir 300 MG capsule Commonly known as:  OMNICEF Take 1 capsule (300 mg total) by mouth every 12 (twelve) hours.   docusate sodium 100 MG capsule Commonly known as:  COLACE Take 1 capsule (100 mg total) by mouth 2 (two) times daily.   donepezil 10 MG tablet Commonly known as:  ARICEPT Take 10 mg  by mouth at bedtime.   DULoxetine 60 MG capsule Commonly known as:  CYMBALTA Take 60 mg by mouth at bedtime.   ferrous sulfate 325 (65 FE) MG tablet Take 1 tablet (325 mg total) by mouth 2 (two) times daily with a meal.   folic acid 1 MG tablet Commonly known as:  FOLVITE Take 1 mg by mouth at bedtime.   hydrOXYzine 25 MG tablet Commonly known as:  ATARAX/VISTARIL Take 25 mg by mouth 2 (two) times daily.   isosorbide-hydrALAZINE 20-37.5 MG tablet Commonly known as:  BIDIL Take 1 tablet by mouth 3 (three) times daily.   levothyroxine 88 MCG tablet Commonly known as:  SYNTHROID, LEVOTHROID Take 88 mcg by mouth daily before breakfast.   multivitamin with minerals tablet Take 1 tablet by mouth every morning.   oxybutynin 5 MG tablet Commonly known as:  DITROPAN Take 5 mg by  mouth 2 (two) times daily.   rasagiline 1 MG Tabs tablet Commonly known as:  AZILECT Take 1 tablet by mouth daily.      Follow-up Information    Rourk, Cristopher Estimable, MD. Call.   Specialty:  Gastroenterology Why:  Call for follow up in a few weeks Contact information: [address] [place] [address] [phone]        Celene Squibb, MD. Call in 1 week(s).   Specialty:  Internal Medicine Why:  Call for follow up appointment one week after discharge from skilled nursing Contact information: [address] [place] [facility] [address] [phone]        Arnoldo Lenis, MD .   Specialty:  Cardiology Contact information: [address] [place] [address] [phone]          No Known Allergies  Consultations:  Cardiology     Acute coronary syndrome (MI, NSTEMI, STEMI, etc) this admission?: Yes.     AHA/ACC Clinical Performance & Quality Measures: 1. Aspirin prescribed? - Yes 2. ADP Receptor Inhibitor (Plavix/Clopidogrel, Brilinta/Ticagrelor or Effient/Prasugrel) prescribed (includes medically managed patients)? - No - deferred by Cardiology in setting of iron  deficiency anemia 3. Beta Blocker prescribed? - Yes 4. High Intensity Statin (Lipitor 40-80mg  or Crestor 20-40mg ) prescribed? - Yes 5. EF assessed during THIS hospitalization? - Yes 6. For EF <40%, was ACEI/ARB prescribed? - No - Reason:  renal insufficiency 7. For EF <40%, Aldosterone Antagonist (Spironolactone or Eplerenone) prescribed? - No - Reason:  renal insufficiency 8. Cardiac Rehab Phase II ordered (Included Medically managed Patients)? - Yes               Procedures/Studies: Dg Chest Portable 1 View  Result Date: 02/09/2019 CLINICAL DATA:  Chest pain, nausea, vomiting EXAM: PORTABLE CHEST 1 VIEW COMPARISON:  06/08/2018 FINDINGS: Cardiomegaly. Dense consolidation in the right mid and lower lung. Mild left perihilar airspace opacities. Findings most likely reflect pneumonia. Small effusions bilaterally. No acute bony abnormality. IMPRESSION: Consolidation in the right mid and lower lung. Less pronounced left perihilar airspace opacities. Findings likely reflect pneumonia. Small bilateral effusions. Electronically Signed   By: Rolm Baptise M.D.   On: 02/09/2019 21:26   Ct Angio Chest/abd/pel For Dissection W And/or W/wo  Result Date: 02/10/2019 CLINICAL DATA:  Acute chest pain, back pain and shortness of breath. Suspicion of acute aortic syndrome versus myocardial infarct. EXAM: CT ANGIOGRAPHY CHEST, ABDOMEN AND PELVIS TECHNIQUE: Multidetector CT imaging through the chest, abdomen and pelvis was performed using the standard protocol during bolus administration of intravenous contrast. Multiplanar reconstructed images and MIPs were obtained and reviewed to evaluate the vascular anatomy. CONTRAST:  139mL OMNIPAQUE IOHEXOL 350 MG/ML SOLN COMPARISON:  Chest x-ray on 02/09/2019 FINDINGS: CTA CHEST FINDINGS Cardiovascular: No evidence of acute aortic dissection or intramural hemorrhage. Proximal great vessels are normally patent. There are some calcifications at the level of the aortic  valve without dilatation of the aortic root. The descending thoracic aorta demonstrates ulcerated plaque and mural thrombus with focal aneurysmal dilatation reaching maximal diameter 4.2-4.3 cm. The aorta measures 2.9 cm at the diaphragmatic hiatus. The heart size is at the upper limits of normal/mildly enlarged. The left ventricular cavity may be mildly dilated. There is some calcified plaque in the distribution of the LAD and likely right coronary artery. Central pulmonary arteries are of normal caliber. Mediastinum/Nodes: Multiple calcified mediastinal and bilateral hilar lymph nodes are consistent with prior granulomatous disease. No enlarged lymph nodes are identified. There is  a small hiatal hernia. Lungs/Pleura: Lungs demonstrate ground-glass airspace opacity predominantly in the upper lobes bilaterally, right greater than left as well as in the right lower lobe. Associated small bilateral pleural effusions. Findings are likely consistent with pulmonary edema. Calcified granulomata are present in both posterior lower lobes. No pneumothorax. Musculoskeletal: No chest wall abnormality. No acute or significant osseous findings. Review of the MIP images confirms the above findings. CTA ABDOMEN AND PELVIS FINDINGS VASCULAR Aorta: Scattered atherosclerotic plaque without evidence of aneurysm or dissection. Celiac: Mild 25-30% stenosis at the origin. Distal branch vessels are normally patent. SMA: Normally patent. Renals: Single right renal artery demonstrates normal patency. Left kidney is supplied by a single vessel which almost immediately bifurcates after its origin. This vessel is normally patent. IMA: Normally patent. Inflow: Bilateral iliac arteries demonstrate normal patency and moderate tortuosity. There is some atherosclerotic plaque in the common iliac arteries bilaterally without evidence of significant stenosis. Bilateral common femoral arteries and femoral bifurcations are widely patent. Review of the  MIP images confirms the above findings. NON-VASCULAR Hepatobiliary: No focal liver abnormality is seen. No gallstones, gallbladder wall thickening, or biliary dilatation. Pancreas: Unremarkable. No pancreatic ductal dilatation or surrounding inflammatory changes. Spleen: Normal in size with multiple calcified granulomata. Adrenals/Urinary Tract: Adrenal glands are unremarkable. Kidneys are normal, without renal calculi, focal lesion, or hydronephrosis. Bladder is unremarkable. Stomach/Bowel: Bowel shows no evidence of obstruction, ileus, inflammation or lesion. There is moderate stool in the rectum. No free air. Lymphatic: No enlarged lymph nodes identified. Reproductive: Status post prostatectomy. Penile prosthesis present with reservoir indenting the dome of the bladder. Other: Small left inguinal hernia contains fat. No abscess or ascites identified. Musculoskeletal: No acute or significant osseous findings. Review of the MIP images confirms the above findings. IMPRESSION: 1. Aneurysmal disease of the mid descending thoracic aorta with maximal diameter of 4.2-4.3 cm. This is associated with posterior wall ulcerated plaque and mural thrombus. No evidence of acute aortic dissection or intramural hemorrhage. Recommend follow-up CTA or MRA of the thoracic aorta in 1 year. 2. Pulmonary edema with more significant alveolar edema in the right lung and associated small bilateral pleural effusions. 3. Cardiac enlargement and suggestion of possible left ventricular cavity dilatation. 4. Coronary atherosclerosis with calcified plaque in the distribution of the LAD and also likely the RCA. 5. Evidence of prior granulomatous disease with multiple calcified mediastinal and bilateral hilar lymph nodes. Calcified granulomata are also present in both lower lungs and the spleen. 6. Small hiatal hernia.  Small left inguinal hernia containing fat. Electronically Signed   By: Aletta Edouard M.D.   On: 02/10/2019 10:18   Left heart  catheterziation 2/28  RCA Prox RCA lesion is 50% stenosed. Prox RCA to Mid RCA lesion is 80% stenosed. Mid RCA lesion is 90% stenosed. Mid RCA to Dist RCA lesion is 100% stenosed.  ----- Part of the PDA and PL system was filled via right to right and left-to-right collaterals.  Cx-OM: Ost Cx to Prox Cx lesion is 45% stenosed with 90% stenosed side branch in LAV Groove.  ------ 3rd LPL-1 lesion is 80% stenosed. 3rd LPL-2 lesion is 99% stenosed.  Prox LAD to Mid LAD lesion is 90% stenosed with 55% stenosed side branch in Ost 2nd Diag.  Mid-distal LAD to Dist LAD lesion is 95% stenosed. Dist LAD lesion is 90% stenosed.  --  LV end diastolic pressure is moderately elevated. Consistent with acute on chronic diastolic heart failure   SUMMARY  Severe multivessel disease: 100%  CTO of mid RCA, 50% proximal circumflex with 90% AV groove followed by 100% CTO of LPL (that recanalizes through left left lateral), 90 to 95% bifurcation LAD-2nd Diag followed by 60% and then tandem 99 to 90% lesions in the mid to distal LAD. -- >  Minimal good PCI or CABG options  Moderately elevated LVEDP consistent with acute diastolic heart failure  RECOMMENDATIONS  The patient will be transferred to a telemetry bed.  I plan to restart IV heparin to run for 48 hours for MI.  (Will restart 8 hours post sheath removal)  Continue aggressive medical management.  I discussed the findings with the patient's power of attorney/brother and sister-in-law:  Options going forward would be medical management, CABG, random PCI.  After discussing with the brother: ? It is clear that PCI is not likely a good option as it would not be a good candidate for dual antiplatelet therapy with frequent falls, he is also was anemic.  ? CABG would not be something that they would be interested in offering based on his dementia, renal insufficiency and Parkinson's.  Would not likely recover.   ? Best option going forward would be medical  management.   With renal insufficiency, we will hydrate him, but he would likely require diuresis and standing diuretic during this hospitalization.  2D echo was ordered to better assess his EF, however I suspect it to be reduced.       Echocardiogram 3/1 IMPRESSIONS    1. The left ventricle has moderate-severely reduced systolic function, with an ejection fraction of 30-35%. The cavity size was normal. There is asymmetric septal hypertrophy.. Left ventricular diastolic Doppler parameters are consistent with impaired  relaxation Elevated mean left atrial pressure.  2. The right ventricle has normal systolic function. The cavity was normal. There is no increase in right ventricular wall thickness.  3. The aortic valve has an indeterminant number of cusps Moderate thickening of the aortic valve Moderate calcification of the aortic valve. Aortic valve regurgitation is mild to moderate by color flow Doppler. mild stenosis of the aortic valve.  Moderate aortic annular calcification noted.  4. There is a mobile density within the MV apparatus that appears to be a rupture chordae. Mild thickening of the mitral valve leaflet. Mild calcification of the mitral valve leaflet. There is mild mitral annular calcification present. No evidence of  mitral valve stenosis.  5. The tricuspid valve is normal in structure.  6. The aortic root is normal in size and structure.  7. There is moderate dilatation of the aortic root measuring 41 mm.  8. Moderate hypokinesis of the left ventricular inferior wall.  9. Severe akinesis of the left ventricular anteroseptal wall. 10. Severe akinesis of the left ventricular apical segment.  FINDINGS  Left Ventricle: The left ventricle has moderate-severely reduced systolic function, with an ejection fraction of 30-35%. The cavity size was normal. There is asymmetric septal hypertrophy.. Left ventricular diastolic Doppler parameters are consistent  with impaired  relaxation Elevated mean left atrial pressure Severe akinesis of the left ventricular anteroseptal wall. Severe akinesis of the left ventricular apical segment. Moderate hypokinesis of the left ventricular inferior wall. Definity contrast  agent was given IV to delineate the left ventricular endocardial borders. Right Ventricle: The right ventricle has normal systolic function. The cavity was normal. There is no increase in right ventricular wall thickness. Left Atrium: left atrial size was normal in size Right Atrium: right atrial size was normal in size. Right atrial pressure is estimated  at 3 mmHg. Interatrial Septum: No atrial level shunt detected by color flow Doppler. Pericardium: There is no evidence of pericardial effusion. Mitral Valve: There is a mobile density within the MV apparatus that appears to be a rupture chordae. Mild thickening of the mitral valve leaflet. Mild calcification of the mitral valve leaflet. There is mild mitral annular calcification present. Mitral  valve regurgitation is mild by color flow Doppler. No evidence of mitral valve stenosis. Tricuspid Valve: The tricuspid valve is normal in structure. Tricuspid valve regurgitation is mild by color flow Doppler. Aortic Valve: The aortic valve has an indeterminant number of cusps Moderate thickening of the aortic valve Moderate calcification of the aortic valve. Aortic valve regurgitation is mild to moderate by color flow Doppler. There is mild stenosis of the  aortic valve, with a calculated valve area of 1.74 cm. Moderate aortic annular calcification noted. Pulmonic Valve: The pulmonic valve was not assessed. Pulmonic valve regurgitation is not visualized by color flow Doppler. No evidence of pulmonic stenosis. Aorta: The aortic root is normal in size and structure. There is moderate dilatation of the aortic root measuring 41 mm. Venous: The inferior vena cava is normal in size with greater than 50% respiratory  variability.    Subjective: Feeling well.  No chest pain, dyspnea, swelling.  No confusion, fever, cough.  Discharge Exam: Vitals:   02/14/19 0758 02/14/19 0849  BP: 135/81 135/81  Pulse: 82 91  Resp: 19   Temp: 98 F (36.7 C)   SpO2: 99%    Vitals:   02/14/19 0033 02/14/19 0430 02/14/19 0758 02/14/19 0849  BP:  (!) 164/80 135/81 135/81  Pulse: 77  82 91  Resp:   19   Temp: 98.9 F (37.2 C) 98.9 F (37.2 C) 98 F (36.7 C)   TempSrc: Oral Oral Oral   SpO2: 96%  99%   Weight:  83 kg    Height:        General: Pt is alert, awake, not in acute distress, lying in bed, eating breakfast Cardiovascular: RRR, nl S1-S2, no murmurs appreciated.   No LE edema.   Respiratory: Normal respiratory rate and rhythm.  CTAB without rales or wheezes. Abdominal: Abdomen soft and non-tender.  No distension or HSM.   Neuro/Psych: Strength symmetric in upper and lower extremities.  Judgment and insight appear moderately impaired by dementia.   The results of significant diagnostics from this hospitalization (including imaging, microbiology, ancillary and laboratory) are listed below for reference.     Microbiology: Recent Results (from the past 240 hour(s))  Blood Culture (routine x 2)     Status: None   Collection Time: 02/09/19  9:34 PM  Result Value Ref Range Status   Specimen Description LEFT ANTECUBITAL  Final   Special Requests   Final    BOTTLES DRAWN AEROBIC AND ANAEROBIC Blood Culture adequate volume   Culture   Final    NO GROWTH 5 DAYS Performed at [facility], [address] [place], [place] [address]    Report Status 02/14/2019 FINAL  Final  Blood Culture (routine x 2)     Status: None   Collection Time: 02/09/19  9:36 PM  Result Value Ref Range Status   Specimen Description BLOOD LEFT HAND  Final   Special Requests   Final    BOTTLES DRAWN AEROBIC AND ANAEROBIC Blood Culture adequate volume   Culture   Final    NO GROWTH 5 DAYS Performed at [facility],  [address],  [place], [place] [address]    Report Status 02/14/2019 FINAL  Final     Labs: BNP (last 3 results) Recent Labs    06/08/18 1840 02/09/19 2100  BNP 973.0* 5,638.7*   Basic Metabolic Panel: Recent Labs  Lab 02/10/19 0547 02/11/19 0303 02/12/19 0253 02/13/19 0329 02/14/19 0242  NA 137 137 139 138 136  K 4.1 4.3 4.0 4.5 4.3  CL 103 104 106 109 110  CO2 24 23 23  20* 17*  GLUCOSE 146* 120* 116* 107* 103*  BUN 43* 40* 36* 36* 35*  CREATININE 1.44* 1.45* 1.71* 1.44* 1.31*  CALCIUM 8.8* 8.5* 8.6* 8.9 8.9   Liver Function Tests: Recent Labs  Lab 02/10/19 0756 02/11/19 0303  AST 142* 91*  ALT 39 41  ALKPHOS 71 59  BILITOT 0.6 0.5  PROT 7.5 6.7  ALBUMIN 3.7 3.0*   No results for input(s): LIPASE, AMYLASE in the last 168 hours. No results for input(s): AMMONIA in the last 168 hours. CBC: Recent Labs  Lab 02/10/19 0547 02/11/19 0303 02/12/19 0253 02/13/19 0329 02/14/19 0242  WBC 8.2 7.9 6.6 6.5 6.2  HGB 8.6* 7.6* 9.1* 8.6* 8.3*  HCT 27.1* 23.1* 27.1* 25.2* 24.6*  MCV 98.5 95.9 93.4 94.7 93.9  PLT 180 191 185 187 201   Cardiac Enzymes: Recent Labs  Lab 02/09/19 2100 02/09/19 2332 02/10/19 0547 02/10/19 0756  TROPONINI 1.22* 1.18* 25.89* 37.67*   BNP: Invalid input(s): POCBNP CBG: Recent Labs  Lab 02/10/19 0916  GLUCAP 113*   D-Dimer No results for input(s): DDIMER in the last 72 hours. Hgb A1c No results for input(s): HGBA1C in the last 72 hours. Lipid Profile No results for input(s): CHOL, HDL, LDLCALC, TRIG, CHOLHDL, LDLDIRECT in the last 72 hours. Thyroid function studies No results for input(s): TSH, T4TOTAL, T3FREE, THYROIDAB in the last 72 hours.  Invalid input(s): FREET3 Anemia work up No results for input(s): VITAMINB12, FOLATE, FERRITIN, TIBC, IRON, RETICCTPCT in the last 72 hours. Urinalysis    Component Value Date/Time   COLORURINE STRAW (A) 02/09/2019 2123   APPEARANCEUR CLEAR 02/09/2019 2123   LABSPEC 1.008 02/09/2019  2123   PHURINE 6.0 02/09/2019 2123   GLUCOSEU NEGATIVE 02/09/2019 2123   HGBUR SMALL (A) 02/09/2019 2123   BILIRUBINUR NEGATIVE 02/09/2019 2123   [facility] NEGATIVE 02/09/2019 2123   PROTEINUR 30 (A) 02/09/2019 2123   NITRITE NEGATIVE 02/09/2019 2123   LEUKOCYTESUR NEGATIVE 02/09/2019 2123   Sepsis Labs Invalid input(s): PROCALCITONIN,  WBC,  LACTICIDVEN Microbiology Recent Results (from the past 240 hour(s))  Blood Culture (routine x 2)     Status: None   Collection Time: 02/09/19  9:34 PM  Result Value Ref Range Status   Specimen Description LEFT ANTECUBITAL  Final   Special Requests   Final    BOTTLES DRAWN AEROBIC AND ANAEROBIC Blood Culture adequate volume   Culture   Final    NO GROWTH 5 DAYS Performed at [facility], [address], [place], [place] [address]    Report Status 02/14/2019 FINAL  Final  Blood Culture (routine x 2)     Status: None   Collection Time: 02/09/19  9:36 PM  Result Value Ref Range Status   Specimen Description BLOOD LEFT HAND  Final   Special Requests   Final    BOTTLES DRAWN AEROBIC AND ANAEROBIC Blood Culture adequate volume   Culture   Final    NO GROWTH 5 DAYS Performed at [facility], [address], [place], [place] [address]  Report Status 02/14/2019 FINAL  Final     Time coordinating discharge: 40 minutes      SIGNED:   Edwin Dada, MD  [facility] 02/14/2019, 10:42 AM

## 2019-02-14 NOTE — Clinical Social Work Placement (Signed)
   CLINICAL SOCIAL WORK PLACEMENT  NOTE  Date:  02/14/2019  Patient Details  Name: [name] MRN: [id number] Date of Birth: [birth year]  Clinical Social Work is seeking post-discharge placement for this patient at the [place] level of care (*CSW will initial, date and re-position this form in  chart as items are completed):  Yes   Patient/family provided with [facility] Work Department's list of facilities offering this level of care within the geographic area requested by the patient (or if unable, by the patient's family).  Yes   Patient/family informed of their freedom to choose among providers that offer the needed level of care, that participate in Medicare, Medicaid or managed care program needed by the patient, have an available bed and are willing to accept the patient.  Yes   Patient/family informed of [facility]'s ownership interest in [facility] and [facility], as well as of the fact that they are under no obligation to receive care at these facilities.  PASRR submitted to EDS on       PASRR number received on 02/13/19     Existing PASRR number confirmed on       FL2 transmitted to all facilities in geographic area requested by pt/family on       FL2 transmitted to all facilities within larger geographic area on       Patient informed that his/her managed care company has contracts with or will negotiate with certain facilities, including the following:        Yes   Patient/family informed of bed offers received.  Patient chooses bed at [facility] )     Physician recommends and patient chooses bed at      Patient to be transferred to [facility] ) on 02/14/19.  Patient to be transferred to facility by [facility]     Patient family notified on 02/14/19 of transfer.  Name of family member notified:  [name], brother      PHYSICIAN Please prepare priority discharge summary, including medications     Additional  Comment:    _______________________________________________ Vinie Sill, [place] 02/14/2019, 12:05 PM

## 2019-02-14 NOTE — Progress Notes (Addendum)
CARDIAC REHAB PHASE I   PRE:  Rate/Rhythm: 93 SR  BP:  Supine:   Sitting: 135/81  Standing:    SaO2:   MODE:  Ambulation: from bathroom to bed ft   POST:  Rate/Rhythm: 98 SR  BP:  Supine:   Sitting: 138/85  Standing:    SaO2:  0940-1013 Assisted pt from bathroom to bed with assistance of rolling walker and NT. Pt very unsteady. Put in bed and put bed alarm on. Pt's brother in room and stated pt has fallen at home. Pt will need reinforcement of ed. Left MI booklet, low sodium and heart healthy diets for wife and brother. Encouraged low sodium and heart healthy food choices with pt. Did not review NTG as brother stated pt would not be safe with that med. Referred to [place] CRP 2 to meet requirement for NSTEMI but pt not appropriate at this time due to fall history, mobility issues and dementia.  PT to see.    Graylon Good, RN BSN  02/14/2019 10:06 AM

## 2019-02-15 DIAGNOSIS — J9601 Acute respiratory failure with hypoxia: Secondary | ICD-10-CM | POA: Diagnosis not present

## 2019-02-15 DIAGNOSIS — J168 Pneumonia due to other specified infectious organisms: Secondary | ICD-10-CM | POA: Diagnosis not present

## 2019-02-15 DIAGNOSIS — I10 Essential (primary) hypertension: Secondary | ICD-10-CM | POA: Diagnosis not present

## 2019-02-15 DIAGNOSIS — F0280 Dementia in other diseases classified elsewhere without behavioral disturbance: Secondary | ICD-10-CM | POA: Diagnosis not present

## 2019-02-15 DIAGNOSIS — G20 Parkinson's disease: Secondary | ICD-10-CM | POA: Diagnosis not present

## 2019-02-28 ENCOUNTER — Other Ambulatory Visit: Payer: Self-pay | Admitting: *Deleted

## 2019-02-28 NOTE — Patient Outreach (Signed)
[facility]) Care Management  02/28/2019  [name] Jan 31, 1941 [id number]   Per PatientPing this patient admitted to skilled facility, noted that they are a [facility] patient.  This patient will be followed by [facility] in the community. [facility] will provide full case management services.  [facility] care management services are not appropriate at this time.   Plan to sign off. Will collaborate with [facility] as indicated.   Royetta Crochet. Laymond Purser, MSN, RN, [employer] , [place] [phone]) Business Cell  [phone]) Toll Free Office

## 2019-03-06 DIAGNOSIS — N183 Chronic kidney disease, stage 3 (moderate): Secondary | ICD-10-CM | POA: Diagnosis not present

## 2019-03-06 DIAGNOSIS — Z7982 Long term (current) use of aspirin: Secondary | ICD-10-CM | POA: Diagnosis not present

## 2019-03-06 DIAGNOSIS — I214 Non-ST elevation (NSTEMI) myocardial infarction: Secondary | ICD-10-CM | POA: Diagnosis not present

## 2019-03-06 DIAGNOSIS — J189 Pneumonia, unspecified organism: Secondary | ICD-10-CM | POA: Diagnosis not present

## 2019-03-06 DIAGNOSIS — I739 Peripheral vascular disease, unspecified: Secondary | ICD-10-CM | POA: Diagnosis not present

## 2019-03-06 DIAGNOSIS — E785 Hyperlipidemia, unspecified: Secondary | ICD-10-CM | POA: Diagnosis not present

## 2019-03-06 DIAGNOSIS — F0280 Dementia in other diseases classified elsewhere without behavioral disturbance: Secondary | ICD-10-CM | POA: Diagnosis not present

## 2019-03-06 DIAGNOSIS — I2510 Atherosclerotic heart disease of native coronary artery without angina pectoris: Secondary | ICD-10-CM | POA: Diagnosis not present

## 2019-03-06 DIAGNOSIS — I5043 Acute on chronic combined systolic (congestive) and diastolic (congestive) heart failure: Secondary | ICD-10-CM | POA: Diagnosis not present

## 2019-03-06 DIAGNOSIS — Z8673 Personal history of transient ischemic attack (TIA), and cerebral infarction without residual deficits: Secondary | ICD-10-CM | POA: Diagnosis not present

## 2019-03-06 DIAGNOSIS — D631 Anemia in chronic kidney disease: Secondary | ICD-10-CM | POA: Diagnosis not present

## 2019-03-06 DIAGNOSIS — F419 Anxiety disorder, unspecified: Secondary | ICD-10-CM | POA: Diagnosis not present

## 2019-03-06 DIAGNOSIS — G20 Parkinson's disease: Secondary | ICD-10-CM | POA: Diagnosis not present

## 2019-03-06 DIAGNOSIS — Z87891 Personal history of nicotine dependence: Secondary | ICD-10-CM | POA: Diagnosis not present

## 2019-03-07 ENCOUNTER — Other Ambulatory Visit: Payer: Self-pay | Admitting: Cardiology

## 2019-03-07 ENCOUNTER — Telehealth: Payer: Self-pay | Admitting: Student

## 2019-03-07 ENCOUNTER — Other Ambulatory Visit: Payer: Self-pay | Admitting: Student

## 2019-03-07 MED ORDER — CARVEDILOL 12.5 MG PO TABS: 12.5000 mg | ORAL_TABLET | Freq: Two times a day (BID) | ORAL | 3 refills | Status: AC

## 2019-03-07 MED ORDER — FUROSEMIDE 20 MG PO TABS: 20.0000 mg | ORAL_TABLET | Freq: Every day | ORAL | 3 refills | Status: DC | PRN

## 2019-03-07 NOTE — Telephone Encounter (Signed)
   Cardiac Questionnaire:    Since your last visit or hospitalization:    1. Have you been having new or worsening chest pain? No   2. Have you been having new or worsening shortness of breath? No 3. Have you been having new or worsening leg swelling, wt gain, or increase in abdominal girth (pants fitting more tightly)? No   4. Have you had any passing out spells? No    *A YES to any of these questions would result in the appointment being kept. *If all the answers to these questions are NO, we should indicate that given the current situation regarding the worldwide coronarvirus pandemic, at the recommendation of the CDC, we are looking to limit gatherings in our waiting area, and thus will reschedule their appointment beyond four weeks from today.   _____________   [name] patient's wife who reports he was discharged from SNF on Sunday and has since returned home. Has overall been doing well per her report and denies any recent anginal symptoms. Has been working with [facility] and has been ambulating around the house without acute symptoms.   Reviewed discharge medications with the patient's wife as she was confused about his cardiac medication regimen. Has not been taking Coreg 25mg  BID. Will plan to restart at a lower dose of 12.5mg  BID and she will follow HR and BP at home. Was recommended to be on low-dose PO Lasix at discharge but was not provided. Sent in script for Lasix 20mg  PRN to take as needed for worsening edema or weight gain > 3 lbs overnight. No recent symptoms per her report but they will follow daily weights.   Can cancel visit for 3/25 and reschedule in 4-6 weeks.   Signed, Erma Heritage, PA-C 03/07/2019, 4:28 PM Pager: [phone]

## 2019-03-08 ENCOUNTER — Ambulatory Visit: Payer: PPO | Admitting: Student

## 2019-03-08 ENCOUNTER — Other Ambulatory Visit: Payer: Self-pay

## 2019-03-08 NOTE — Patient Outreach (Signed)
[facility]) Care Management  03/08/2019  [name] 09/04/1941 [id number]    EMMI-General Discharge RED ON EMMI ALERT Day # 1 Date: 03/07/2019 Red Alert Reason: " Got discharge papers? No"   Outreach attempt # 1 to patient.Spoke with spouse. She voices that she is answering automated calls.  Reviewed and addressed red alert. Spouse reports that she had a a hard time understanding what machine was saying. She was able to confirm that they have d/c paperwork in the home. She voices that she understands info. She spoke with cardiology office on yesterday and they did a "follow up phone visit" with patient due to recent outbreak and patient unable to go to MD office. She voices that PCP office will be doing the same thing with them tomorrow. Spouse confirms that patient has all his meds in the home and no issues or concerns regarding them. [facility] services in place. HHPT was out yesterday to see patient and HHRN will be coming today. No further RN CM needs or concerns at this time. Advised spouse that they would get one more automated EMMI-GENERAL post discharge calls to assess how they are doing following recent hospitalization and will receive a call from a nurse if any of their responses were abnormal. She voiced understanding and was appreciative of f/u call.    Plan: RN CM will close case as no further interventions needed at this time.   Enzo Montgomery, RN,BSN,CCM [facility] Telephonic Care Management Coordinator Direct Phone: [phone] Toll Free: [phone] Fax: [phone]

## 2019-03-09 DIAGNOSIS — E039 Hypothyroidism, unspecified: Secondary | ICD-10-CM | POA: Diagnosis not present

## 2019-03-09 DIAGNOSIS — I10 Essential (primary) hypertension: Secondary | ICD-10-CM | POA: Diagnosis not present

## 2019-03-09 DIAGNOSIS — F0281 Dementia in other diseases classified elsewhere with behavioral disturbance: Secondary | ICD-10-CM | POA: Diagnosis not present

## 2019-03-09 DIAGNOSIS — I214 Non-ST elevation (NSTEMI) myocardial infarction: Secondary | ICD-10-CM | POA: Diagnosis not present

## 2019-03-09 DIAGNOSIS — N183 Chronic kidney disease, stage 3 (moderate): Secondary | ICD-10-CM | POA: Diagnosis not present

## 2019-03-09 DIAGNOSIS — I2510 Atherosclerotic heart disease of native coronary artery without angina pectoris: Secondary | ICD-10-CM | POA: Diagnosis not present

## 2019-03-09 DIAGNOSIS — G20 Parkinson's disease: Secondary | ICD-10-CM | POA: Diagnosis not present

## 2019-03-09 DIAGNOSIS — J189 Pneumonia, unspecified organism: Secondary | ICD-10-CM | POA: Diagnosis not present

## 2019-03-09 DIAGNOSIS — M10041 Idiopathic gout, right hand: Secondary | ICD-10-CM | POA: Diagnosis not present

## 2019-03-09 DIAGNOSIS — I5043 Acute on chronic combined systolic (congestive) and diastolic (congestive) heart failure: Secondary | ICD-10-CM | POA: Diagnosis not present

## 2019-03-09 DIAGNOSIS — E782 Mixed hyperlipidemia: Secondary | ICD-10-CM | POA: Diagnosis not present

## 2019-03-09 DIAGNOSIS — D509 Iron deficiency anemia, unspecified: Secondary | ICD-10-CM | POA: Diagnosis not present

## 2019-03-10 DIAGNOSIS — G20 Parkinson's disease: Secondary | ICD-10-CM | POA: Diagnosis not present

## 2019-03-10 DIAGNOSIS — I5042 Chronic combined systolic (congestive) and diastolic (congestive) heart failure: Secondary | ICD-10-CM | POA: Diagnosis not present

## 2019-03-10 DIAGNOSIS — N183 Chronic kidney disease, stage 3 (moderate): Secondary | ICD-10-CM | POA: Diagnosis not present

## 2019-03-10 DIAGNOSIS — I739 Peripheral vascular disease, unspecified: Secondary | ICD-10-CM | POA: Diagnosis not present

## 2019-03-17 ENCOUNTER — Telehealth: Payer: Self-pay

## 2019-03-17 DIAGNOSIS — I5043 Acute on chronic combined systolic (congestive) and diastolic (congestive) heart failure: Secondary | ICD-10-CM | POA: Diagnosis not present

## 2019-03-17 DIAGNOSIS — Z9181 History of falling: Secondary | ICD-10-CM | POA: Diagnosis not present

## 2019-03-17 MED ORDER — ISOSORB DINITRATE-HYDRALAZINE 20-37.5 MG PO TABS: 1.0000 | ORAL_TABLET | Freq: Three times a day (TID) | ORAL | 3 refills | Status: DC

## 2019-03-17 NOTE — Telephone Encounter (Signed)
Wife called to say she needed refill of Bidil for husband, e-scribed refill to [employer]

## 2019-03-28 DIAGNOSIS — Z0000 Encounter for general adult medical examination without abnormal findings: Secondary | ICD-10-CM | POA: Diagnosis not present

## 2019-04-04 DIAGNOSIS — F0281 Dementia in other diseases classified elsewhere with behavioral disturbance: Secondary | ICD-10-CM | POA: Diagnosis not present

## 2019-04-04 DIAGNOSIS — D509 Iron deficiency anemia, unspecified: Secondary | ICD-10-CM | POA: Diagnosis not present

## 2019-04-04 DIAGNOSIS — I2510 Atherosclerotic heart disease of native coronary artery without angina pectoris: Secondary | ICD-10-CM | POA: Diagnosis not present

## 2019-04-04 DIAGNOSIS — N183 Chronic kidney disease, stage 3 (moderate): Secondary | ICD-10-CM | POA: Diagnosis not present

## 2019-04-04 DIAGNOSIS — G20 Parkinson's disease: Secondary | ICD-10-CM | POA: Diagnosis not present

## 2019-04-04 DIAGNOSIS — M10041 Idiopathic gout, right hand: Secondary | ICD-10-CM | POA: Diagnosis not present

## 2019-04-04 DIAGNOSIS — I712 Thoracic aortic aneurysm, without rupture: Secondary | ICD-10-CM | POA: Diagnosis not present

## 2019-04-04 DIAGNOSIS — E782 Mixed hyperlipidemia: Secondary | ICD-10-CM | POA: Diagnosis not present

## 2019-04-04 DIAGNOSIS — I10 Essential (primary) hypertension: Secondary | ICD-10-CM | POA: Diagnosis not present

## 2019-04-04 DIAGNOSIS — E039 Hypothyroidism, unspecified: Secondary | ICD-10-CM | POA: Diagnosis not present

## 2019-04-04 DIAGNOSIS — I5043 Acute on chronic combined systolic (congestive) and diastolic (congestive) heart failure: Secondary | ICD-10-CM | POA: Diagnosis not present

## 2019-04-12 ENCOUNTER — Telehealth: Payer: Self-pay | Admitting: Student

## 2019-04-12 NOTE — Telephone Encounter (Signed)
Virtual Visit Pre-Appointment Phone Call  "(Name), I am calling you today to discuss your upcoming appointment. We are currently trying to limit exposure to the virus that causes COVID-19 by seeing patients at home rather than in the office."  1. "What is the BEST phone number to call the day of the visit?" - include this in appointment notes  2. Do you have or have access to (through a family member/friend) a smartphone with video capability that we can use for your visit?" a. If yes - list this number in appt notes as cell (if different from BEST phone #) and list the appointment type as a VIDEO visit in appointment notes b. If no - list the appointment type as a PHONE visit in appointment notes  3. Confirm consent - "In the setting of the current Covid19 crisis, you are scheduled for a (phone or video) visit with your provider on (date) at (time).  Just as we do with many in-office visits, in order for you to participate in this visit, we must obtain consent.  If you'd like, I can send this to your mychart (if signed up) or email for you to review.  Otherwise, I can obtain your verbal consent now.  All virtual visits are billed to your insurance company just like a normal visit would be.  By agreeing to a virtual visit, we'd like you to understand that the technology does not allow for your provider to perform an examination, and thus may limit your provider's ability to fully assess your condition. If your provider identifies any concerns that need to be evaluated in person, we will make arrangements to do so.  Finally, though the technology is pretty good, we cannot assure that it will always work on either your or our end, and in the setting of a video visit, we may have to convert it to a phone-only visit.  In either situation, we cannot ensure that we have a secure connection.  Are you willing to proceed?" STAFF: Did the patient verbally acknowledge consent to telehealth visit? Document  YES/NO here: Yes  4. Advise patient to be prepared - "Two hours prior to your appointment, go ahead and check your blood pressure, pulse, oxygen saturation, and your weight (if you have the equipment to check those) and write them all down. When your visit starts, your provider will ask you for this information. If you have an Apple Watch or Kardia device, please plan to have heart rate information ready on the day of your appointment. Please have a pen and paper handy nearby the day of the visit as well."  5. Give patient instructions for MyChart download to smartphone OR [email or web address] as below if video visit (depending on what platform provider is using)  6. Inform patient they will receive a phone call 15 minutes prior to their appointment time (may be from unknown caller ID) so they should be prepared to answer    TELEPHONE CALL NOTE  Ayron Fillinger has been deemed a candidate for a follow-up tele-health visit to limit community exposure during the Covid-19 pandemic. I spoke with the patient via phone to ensure availability of phone/video source, confirm preferred email & phone number, and discuss instructions and expectations.  I reminded Aerik Polan to be prepared with any vital sign and/or heart rhythm information that could potentially be obtained via home monitoring, at the time of his visit. I reminded Shayon Trompeter to expect a phone call prior to his visit.  Terry L Goins 04/12/2019 11:11 AM

## 2019-04-16 DIAGNOSIS — Z9181 History of falling: Secondary | ICD-10-CM | POA: Diagnosis not present

## 2019-04-16 DIAGNOSIS — I5043 Acute on chronic combined systolic (congestive) and diastolic (congestive) heart failure: Secondary | ICD-10-CM | POA: Diagnosis not present

## 2019-04-19 ENCOUNTER — Telehealth (INDEPENDENT_AMBULATORY_CARE_PROVIDER_SITE_OTHER): Payer: PPO | Admitting: Student

## 2019-04-19 ENCOUNTER — Encounter: Payer: Self-pay | Admitting: Student

## 2019-04-19 VITALS — BP 158/83 | HR 70 | Ht 69.0 in | Wt 177.0 lb

## 2019-04-19 DIAGNOSIS — F0281 Dementia in other diseases classified elsewhere with behavioral disturbance: Secondary | ICD-10-CM

## 2019-04-19 DIAGNOSIS — I712 Thoracic aortic aneurysm, without rupture, unspecified: Secondary | ICD-10-CM

## 2019-04-19 DIAGNOSIS — E785 Hyperlipidemia, unspecified: Secondary | ICD-10-CM

## 2019-04-19 DIAGNOSIS — I2510 Atherosclerotic heart disease of native coronary artery without angina pectoris: Secondary | ICD-10-CM | POA: Diagnosis not present

## 2019-04-19 DIAGNOSIS — I5042 Chronic combined systolic (congestive) and diastolic (congestive) heart failure: Secondary | ICD-10-CM

## 2019-04-19 DIAGNOSIS — I511 Rupture of chordae tendineae, not elsewhere classified: Secondary | ICD-10-CM

## 2019-04-19 DIAGNOSIS — I10 Essential (primary) hypertension: Secondary | ICD-10-CM

## 2019-04-19 DIAGNOSIS — F02818 Dementia in other diseases classified elsewhere, unspecified severity, with other behavioral disturbance: Secondary | ICD-10-CM

## 2019-04-19 DIAGNOSIS — G20 Parkinson's disease: Secondary | ICD-10-CM

## 2019-04-19 DIAGNOSIS — Z7189 Other specified counseling: Secondary | ICD-10-CM

## 2019-04-19 MED ORDER — AMLODIPINE BESYLATE 5 MG PO TABS: 5.0000 mg | ORAL_TABLET | Freq: Every day | ORAL | 3 refills | Status: DC

## 2019-04-19 MED ORDER — ATORVASTATIN CALCIUM 40 MG PO TABS: 40.0000 mg | ORAL_TABLET | Freq: Every day | ORAL | 3 refills | Status: AC

## 2019-04-19 MED ORDER — NITROGLYCERIN 0.4 MG SL SUBL: 0.4000 mg | SUBLINGUAL_TABLET | SUBLINGUAL | 3 refills | Status: AC | PRN

## 2019-04-19 NOTE — Patient Instructions (Addendum)
Medication Instructions:  Your physician recommends that you continue on your current medications as directed. Please refer to the Current Medication list given to you today.  Start Nitroglycerin for chest pain as needed  Labwork: NONE  Testing/Procedures: NONE   Follow-Up: Your physician recommends that you schedule a follow-up appointment in: 3 Months with Dr. Harl Bowie  Any Other Special Instructions Will Be Listed Below (If Applicable).     If you need a refill on your cardiac medications before your next appointment, please call your pharmacy.  Thank you for choosing [facility]!   Nitroglycerin sublingual tablets What is this medicine? NITROGLYCERIN (nye troe GLI ser in) is a type of vasodilator. It relaxes blood vessels, increasing the blood and oxygen supply to your heart. This medicine is used to relieve chest pain caused by angina. It is also used to prevent chest pain before activities like climbing stairs, going outdoors in cold weather, or sexual activity. This medicine may be used for other purposes; ask your health care provider or pharmacist if you have questions. COMMON BRAND NAME(S): Nitroquick, Nitrostat, Nitrotab What should I tell my health care provider before I take this medicine? They need to know if you have any of these conditions: -anemia -head injury, recent stroke, or bleeding in the brain -liver disease -previous heart attack -an unusual or allergic reaction to nitroglycerin, other medicines, foods, dyes, or preservatives -pregnant or trying to get pregnant -breast-feeding How should I use this medicine? Take this medicine by mouth as needed. At the first sign of an angina attack (chest pain or tightness) place one tablet under your tongue. You can also take this medicine 5 to 10 minutes before an event likely to produce chest pain. Follow the directions on the prescription label. Let the tablet dissolve under the tongue. Do not swallow  whole. Replace the dose if you accidentally swallow it. It will help if your mouth is not dry. Saliva around the tablet will help it to dissolve more quickly. Do not eat or drink, smoke or chew tobacco while a tablet is dissolving. If you are not better within 5 minutes after taking ONE dose of nitroglycerin, call 9-1-1 immediately to seek emergency medical care. Do not take more than 3 nitroglycerin tablets over 15 minutes. If you take this medicine often to relieve symptoms of angina, your doctor or health care professional may provide you with different instructions to manage your symptoms. If symptoms do not go away after following these instructions, it is important to call 9-1-1 immediately. Do not take more than 3 nitroglycerin tablets over 15 minutes. Talk to your pediatrician regarding the use of this medicine in children. Special care may be needed. Overdosage: If you think you have taken too much of this medicine contact a poison control center or emergency room at once. NOTE: This medicine is only for you. Do not share this medicine with others. What if I miss a dose? This does not apply. This medicine is only used as needed. What may interact with this medicine? Do not take this medicine with any of the following medications: -certain migraine medicines like ergotamine and dihydroergotamine (DHE) -medicines used to treat erectile dysfunction like sildenafil, tadalafil, and vardenafil -riociguat This medicine may also interact with the following medications: -alteplase -aspirin -heparin -medicines for high blood pressure -medicines for mental depression -other medicines used to treat angina -phenothiazines like chlorpromazine, mesoridazine, prochlorperazine, thioridazine This list may not describe all possible interactions. Give your health care provider a list  of all the medicines, herbs, non-prescription drugs, or dietary supplements you use. Also tell them if you smoke, drink  alcohol, or use illegal drugs. Some items may interact with your medicine. What should I watch for while using this medicine? Tell your doctor or health care professional if you feel your medicine is no longer working. Keep this medicine with you at all times. Sit or lie down when you take your medicine to prevent falling if you feel dizzy or faint after using it. Try to remain calm. This will help you to feel better faster. If you feel dizzy, take several deep breaths and lie down with your feet propped up, or bend forward with your head resting between your knees. You may get drowsy or dizzy. Do not drive, use machinery, or do anything that needs mental alertness until you know how this drug affects you. Do not stand or sit up quickly, especially if you are an older patient. This reduces the risk of dizzy or fainting spells. Alcohol can make you more drowsy and dizzy. Avoid alcoholic drinks. Do not treat yourself for coughs, colds, or pain while you are taking this medicine without asking your doctor or health care professional for advice. Some ingredients may increase your blood pressure. What side effects may I notice from receiving this medicine? Side effects that you should report to your doctor or health care professional as soon as possible: -blurred vision -dry mouth -skin rash -sweating -the feeling of extreme pressure in the head -unusually weak or tired Side effects that usually do not require medical attention (report to your doctor or health care professional if they continue or are bothersome): -flushing of the face or neck -headache -irregular heartbeat, palpitations -nausea, vomiting This list may not describe all possible side effects. Call your doctor for medical advice about side effects. You may report side effects to FDA at 1-800-FDA-1088. Where should I keep my medicine? Keep out of the reach of children. Store at room temperature between 20 and 25 degrees C (68 and 77  degrees F). Store in [employer]. Protect from light and moisture. Keep tightly closed. Throw away any unused medicine after the expiration date. NOTE: This sheet is a summary. It may not cover all possible information. If you have questions about this medicine, talk to your doctor, pharmacist, or health care provider.  2019 Elsevier/Gold Standard (2013-09-28 17:57:36)

## 2019-04-19 NOTE — Progress Notes (Signed)
Virtual Visit via Telephone Note   This visit type was conducted due to national recommendations for restrictions regarding the COVID-19 Pandemic (e.g. social distancing) in an effort to limit this patient's exposure and mitigate transmission in our community.  Due to his co-morbid illnesses, this patient is at least at moderate risk for complications without adequate follow up.  This format is felt to be most appropriate for this patient at this time.  The patient did not have access to video technology/had technical difficulties with video requiring transitioning to audio format only (telephone).  All issues noted in this document were discussed and addressed.  No physical exam could be performed with this format.  Please refer to the patient's chart for his  consent to telehealth for [facility].   Date:  04/19/2019   ID:  [name], DOB June 21, 1941, MRN [id number]  Patient Location: Home Provider Location: Home  PCP:  Celene Squibb, MD  Cardiologist:  Carlyle Dolly, MD  Electrophysiologist:  None   Evaluation Performed:  Follow-Up Visit  Chief Complaint: Hospital Follow-Up  History of Present Illness:    [name] is a 78 y.o. male with past medical history of chronic combined systolic and diastolic CHF, ruptured MV chorda tendinae, HTN, HLD, Parkinson's disease, thoracic aortic aneurysm, and Stage 3 CKD who presents for a telehealth visit in regards to hospital follow-up.   He was most recently admitted to [facility] on 02/09/2019 for evaluation of chest pain and was found to have an NSTEMI with troponin values peaking at 37.67. He was started on IV Heparin and IV NTG with transfer to [facility] arranged for a cardiac catheterization. This was performed by Dr. Ellyn Hack and showed severe multivessel disease with 100% CTO of mid RCA, 50% proximal circumflex with 90% AV groove followed by 100% CTO of LPL (that recanalizes through left left lateral), 90 to 95% bifurcation LAD-2nd  Diag followed by 60% and then tandem 99 to 90% lesions in the mid to distal LAD. Options were reviewed with the patient and his family and he was not felt to be a good candidate for PCI or CABG with continued medical management recommended. An echocardiogram was obtained and showed his EF was reduced at 30-35%, mild to moderate AI, mild AS, and a mobile density along the MV consistent with known ruptured chordae. He progressed well during admission and denied any recurrent chest pain or dyspnea. He was continued on ASA 81mg  daily along with being started on Amlodipine, BiDil, and Atorvastatin with Coreg being increased to 25mg  BID. Of note, he was also treated for CAP during admission by the Hospitalist. Was initially discharged to SNF but upon following up with the patient via a telephone call on 3/24, he had been discharged home. Denied any recurrent anginal symptoms at that time but there was confusion about his medication regimen as these had been adjusted while at SNF. He was not taking Coreg so this was restarted at 12.5mg  BID and he was given a script for Lasix 20mg  PRN.    In talking with the patient and his wife (who provides most of the history), he reports overall doing well since his recent hospitalization. No recurrent episodes of chest pain or dyspnea on exertion. He is not overly active at baseline but walks to the porch and in the yard on occasion without any anginal symptoms.   No recent orthopnea, PND, or edema. She reports weight has been stable when checked at home and he has not  required any of the PRN Lasix previously prescribed.   The patient does not have symptoms concerning for COVID-19 infection (fever, chills, cough, or new shortness of breath).    Past Medical History:  Diagnosis Date  . Anxiety   . CAD (coronary artery disease)    a. 01/2019: cath showing severe multivessel disease with 100% CTO of mid RCA, 50% proximal circumflex with 90% AV groove followed by 100% CTO of  LPL (that recanalizes through left left lateral), 90 to 95% bifurcation LAD-2nd Diag followed by 60% and then tandem 99 to 90% lesions in the mid to distal LAD. Not candidate for CABG or PCI. Med management recommended.   . Cardiomyopathy ([place])   . Chronic combined systolic and diastolic CHF (congestive heart failure) ([place])   . Dementia ([place])   . GERD (gastroesophageal reflux disease)   . Hyperlipidemia   . Hypertension   . Incontinence   . 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  . Myocardial infarction ([place])   . Neuromuscular disorder ([place])   . Parkinson's disease ([place])   . Peripheral vascular disease [facility])    Past Surgical History:  Procedure Laterality Date  . BLADDER REPAIR     pt sts "when I had my prostatectomy they cut too much and I have an internal button I have to press in order to release my urine"  . BLADDER SURGERY     2010  pump placed   . CATARACT EXTRACTION W/PHACO  07/28/2011   Procedure: CATARACT EXTRACTION PHACO AND INTRAOCULAR LENS PLACEMENT (IOC);  Surgeon: Elta Guadeloupe T. Gershon Crane;  Location: [facility];  Service: Ophthalmology;  Laterality: Right;  CDE: 20.26  . CATARACT EXTRACTION W/PHACO  09/08/2011   Procedure: CATARACT EXTRACTION PHACO AND INTRAOCULAR LENS PLACEMENT (IOC);  Surgeon: Elta Guadeloupe T. Gershon Crane;  Location: [facility];  Service: Ophthalmology;  Laterality: Left;  CDE:37.31  . ENDARTERECTOMY Right 10/07/2016   Procedure: RIGHT ENDARTERECTOMY CAROTID WITH LIGATION OF INTERNAL CAROTID;  Surgeon: Serafina Mitchell, MD;  Location: [place];  Service: Vascular;  Laterality: Right;  . LEFT HEART CATH AND CORONARY ANGIOGRAPHY N/A 02/10/2019   Procedure: LEFT HEART CATH AND CORONARY ANGIOGRAPHY;  Surgeon: Leonie Man, MD;  Location: [place] CV LAB;  Service: Cardiovascular;  Laterality: N/A;  . PATCH ANGIOPLASTY Right 10/07/2016   Procedure: PATCH ANGIOPLASTY USING Rueben Bash BIOLOGIC PATCH;  Surgeon: Serafina Mitchell, MD;  Location: [place];  Service: Vascular;   Laterality: Right;  . PROSTATECTOMY    . TEE WITHOUT CARDIOVERSION N/A 06/10/2018   Procedure: TRANSESOPHAGEAL ECHOCARDIOGRAM (TEE);  Surgeon: Arnoldo Lenis, MD;  [facility];  Service: Endoscopy;  Laterality: N/A;  . YAG LASER APPLICATION  63/12/6008   Procedure: YAG LASER APPLICATION;  Surgeon: Elta Guadeloupe T. Gershon Crane, MD;  Location: [facility];  Service: Ophthalmology;  Laterality: Right;     Current Meds  Medication Sig  . acetaminophen (TYLENOL) 650 MG CR tablet Take 650-1,300 mg by mouth daily as needed for pain.   Mar[place] Kitchen amLODipine (NORVASC) 5 MG tablet Take 1 tablet (5 mg total) by mouth daily.  Marland Kitchen aspirin EC 81 MG tablet Take 81 mg by mouth daily.  Marland Kitchen atorvastatin (LIPITOR) 40 MG tablet Take 1 tablet (40 mg total) by mouth daily at 6 PM.  . carbidopa-levodopa (SINEMET) 25-100 MG per tablet Take 1 tablet by mouth 2 (two) times daily.   . carvedilol (COREG) 12.5 MG tablet Take 1 tablet (12.5 mg total) by mouth 2 (two) times  daily with a meal.  . docusate sodium (COLACE) 100 MG capsule Take 1 capsule (100 mg total) by mouth 2 (two) times daily.  Marland Kitchen donepezil (ARICEPT) 10 MG tablet Take 10 mg by mouth at bedtime.  . DULoxetine (CYMBALTA) 60 MG capsule Take 60 mg by mouth at bedtime.   . ferrous sulfate 325 (65 FE) MG tablet Take 1 tablet (325 mg total) by mouth 2 (two) times daily with a meal.  . folic acid (FOLVITE) 1 MG tablet Take 1 mg by mouth at bedtime.   . furosemide (LASIX) 20 MG tablet Take 1 tablet (20 mg total) by mouth daily as needed (for weight gain greater than 3 pounds overnight or lower extremity edema).  . hydrOXYzine (ATARAX/VISTARIL) 25 MG tablet Take 25 mg by mouth 3 (three) times daily.   . isosorbide-hydrALAZINE (BIDIL) 20-37.5 MG tablet Take 1 tablet by mouth 3 (three) times daily.  Marland Kitchen levothyroxine (SYNTHROID) 100 MCG tablet   . Multiple Vitamins-Minerals (MULTIVITAMIN WITH MINERALS) tablet Take 1 tablet by mouth every morning.   Marland Kitchen oxybutynin (DITROPAN) 5 MG tablet  Take 5 mg by mouth 2 (two) times daily.  . rasagiline (AZILECT) 1 MG TABS tablet Take 1 tablet by mouth daily.  . [DISCONTINUED] amLODipine (NORVASC) 5 MG tablet Take 1 tablet (5 mg total) by mouth daily.  . [DISCONTINUED] atorvastatin (LIPITOR) 40 MG tablet Take 1 tablet (40 mg total) by mouth daily at 6 PM.     Allergies:   Patient has no known allergies.   Social History   Tobacco Use  . Smoking status: Former Smoker    Packs/day: 0.50    Years: 10.00    Pack years: 5.00    Types: Cigarettes    Last attempt to quit: 07/24/1983    Years since quitting: 35.7  . Smokeless tobacco: Never Used  Substance Use Topics  . Alcohol use: No  . Drug use: No     Family Hx: The patient's family history includes Cancer in his father and mother; Heart Problems in his brother. There is no history of Anesthesia problems, Hypotension, Malignant hyperthermia, or Pseudochol deficiency.  ROS:   Please see the history of present illness.     All other systems reviewed and are negative.   Prior CV studies:   The following studies were reviewed today:  Cardiac Catheterization: 02/10/2019  RCA Prox RCA lesion is 50% stenosed. Prox RCA to Mid RCA lesion is 80% stenosed. Mid RCA lesion is 90% stenosed. Mid RCA to Dist RCA lesion is 100% stenosed.  ----- Part of the PDA and PL system was filled via right to right and left-to-right collaterals.  Cx-OM: Ost Cx to Prox Cx lesion is 45% stenosed with 90% stenosed side branch in LAV Groove.  ------ 3rd LPL-1 lesion is 80% stenosed. 3rd LPL-2 lesion is 99% stenosed.  Prox LAD to Mid LAD lesion is 90% stenosed with 55% stenosed side branch in Ost 2nd Diag.  Mid-distal LAD to Dist LAD lesion is 95% stenosed. Dist LAD lesion is 90% stenosed.  --  LV end diastolic pressure is moderately elevated. Consistent with acute on chronic diastolic heart failure   SUMMARY  Severe multivessel disease: 100% CTO of mid RCA, 50% proximal circumflex with 90% AV  groove followed by 100% CTO of LPL (that recanalizes through left left lateral), 90 to 95% bifurcation LAD-2nd Diag followed by 60% and then tandem 99 to 90% lesions in the mid to distal LAD. -- >  Minimal good PCI  or CABG options  Moderately elevated LVEDP consistent with acute diastolic heart failure  RECOMMENDATIONS  The patient will be transferred to a telemetry bed.  I plan to restart IV heparin to run for 48 hours for MI.  (Will restart 8 hours post sheath removal)  Continue aggressive medical management.  I discussed the findings with the patient's power of attorney/brother and sister-in-law:  Options going forward would be medical management, CABG, random PCI.  After discussing with the brother: ? It is clear that PCI is not likely a good option as it would not be a good candidate for dual antiplatelet therapy with frequent falls, he is also was anemic.  ? CABG would not be something that they would be interested in offering based on his dementia, renal insufficiency and Parkinson's.  Would not likely recover.   ? Best option going forward would be medical management.   With renal insufficiency, we will hydrate him, but he would likely require diuresis and standing diuretic during this hospitalization.  2D echo was ordered to better assess his EF, however I suspect it to be reduced.  Echocardiogram: 02/12/2019 IMPRESSIONS    1. The left ventricle has moderate-severely reduced systolic function, with an ejection fraction of 30-35%. The cavity size was normal. There is asymmetric septal hypertrophy.. Left ventricular diastolic Doppler parameters are consistent with impaired  relaxation Elevated mean left atrial pressure.  2. The right ventricle has normal systolic function. The cavity was normal. There is no increase in right ventricular wall thickness.  3. The aortic valve has an indeterminant number of cusps Moderate thickening of the aortic valve Moderate calcification of the  aortic valve. Aortic valve regurgitation is mild to moderate by color flow Doppler. mild stenosis of the aortic valve.  Moderate aortic annular calcification noted.  4. There is a mobile density within the MV apparatus that appears to be a rupture chordae. Mild thickening of the mitral valve leaflet. Mild calcification of the mitral valve leaflet. There is mild mitral annular calcification present. No evidence of  mitral valve stenosis.  5. The tricuspid valve is normal in structure.  6. The aortic root is normal in size and structure.  7. There is moderate dilatation of the aortic root measuring 41 mm.  8. Moderate hypokinesis of the left ventricular inferior wall.  9. Severe akinesis of the left ventricular anteroseptal wall. 10. Severe akinesis of the left ventricular apical segment.   Labs/Other Tests and Data Reviewed:    EKG:  No ECG reviewed.  Recent Labs: 11/25/2018: Magnesium 2.2 02/09/2019: B Natriuretic Peptide 1,647.0 02/10/2019: TSH 8.726 02/11/2019: ALT 41 02/14/2019: BUN 35; Creatinine, Ser 1.31; Hemoglobin 8.3; Platelets 201; Potassium 4.3; Sodium 136   Recent Lipid Panel Lab Results  Component Value Date/Time   CHOL 125 02/11/2019 03:03 AM   TRIG 65 02/11/2019 03:03 AM   HDL 26 (L) 02/11/2019 03:03 AM   CHOLHDL 4.8 02/11/2019 03:03 AM   LDLCALC 86 02/11/2019 03:03 AM    Wt Readings from Last 3 Encounters:  04/19/19 177 lb (80.3 kg)  02/14/19 183 lb (83 kg)  11/23/18 187 lb 9.6 oz (85.1 kg)     Objective:    Vital Signs:  BP (!) 158/83   Pulse 70   Ht 5\' 9"  (1.753 m)   Wt 177 lb (80.3 kg)   BMI 26.14 kg/m    General: Pleasant male sounding in NAD Psych: Normal affect. Neuro: Alert and oriented X 2 (person and place) Lungs:  Resp regular and  unlabored while talking on the phone.   ASSESSMENT & PLAN:    1. CAD - recent cardiac catheterization showed severe multivessel disease with 100% CTO of mid RCA, 50% proximal circumflex with 90% AV groove  followed by 100% CTO of LPL (that recanalizes through left left lateral), 90 to 95% bifurcation LAD-2nd Diag followed by 60% and then tandem 99 to 90% lesions in the mid to distal LAD. Not felt to be a good candidate for PCI or CABG with continued medical management recommended.  - he has overall done very well since returning home and denies any recent chest pain or dyspnea on exertion. - continue with medical therapy including ASA (not on Plavix given anemia and frequent falls), BB, statin therapy, and BiDil. Will send in Rx for SL NTG as they do not have this available at home. We reviewed instructions for how to use NTG today and this was also included in the AVS.   2. Chronic Combined Systolic and Diastolic CHF - he has a reduced EF of 30-35% by most recent echocardiogram. He denies any recent orthopnea, PND, or edema. Weight has been stable. - will continue with current medication regimen including Bidil and Coreg. He has Lasix to take as needed for edema or weight gain.   3. Thoracic Aortic Aneurysm - CTA in 01/2019 showed aneurysmal disease of the mid descending thoracic aorta with maximal diameter of 4.2-4.3 cm. This was associated with posterior wall ulcerated plaque and mural thrombus with no evidence of acute aortic dissection or intramural hemorrhage. - he was evaluated by Dr. Donzetta Matters with Vascular Surgery during admission with repeat CTA in 1 year recommended if the patient and his family wished to proceed with additional testing at that time.   4. Ruptured Chordae Tendineae - noted on prior echocardiogram and on most recent study in 02/2019. Had mild mitral annular calcification present with no evidence of mitral valve stenosis at that time.  5. HTN - BP elevated at 158/83 today but his wife reports his SBP has been in the 120's to 130's when checked most days. I encouraged them to continue to monitor for now and keep a list of his readings. Remains on Amlodipine, Coreg, and BiDil for  now. If BP remains elevated, would further titrate Coreg from 12.5mg  BID to 25mg  BID.   6. HLD - FLP during recent admission showed total cholesterol of 125, HDL 26, and LDL 86. Was switched to Atorvastatin 40mg  daily at that time. The patient and his wife are not currently leaving their home due to COVID-19. Once the situation improves, he will need a repeat FLP and LFT's included in his lab work.   7. Dementia in the setting of Parkinson's Disease - A&Ox2 when talking on the phone.    8. COVID-19 Education - The signs and symptoms of COVID-19 were discussed with the patient. The importance of social distancing was discussed today.  Time:   Today, I have spent 27 minutes with the patient with telehealth technology discussing the above problems.     Medication Adjustments/Labs and Tests Ordered: Current medicines are reviewed at length with the patient today.  Concerns regarding medicines are outlined above.   Tests Ordered: No orders of the defined types were placed in this encounter.   Medication Changes: Meds ordered this encounter  Medications  . amLODipine (NORVASC) 5 MG tablet    Sig: Take 1 tablet (5 mg total) by mouth daily.    Dispense:  90 tablet  Refill:  3    Order Specific Question:   Supervising Provider    Answer:   Dorothy Spark [id number]  . atorvastatin (LIPITOR) 40 MG tablet    Sig: Take 1 tablet (40 mg total) by mouth daily at 6 PM.    Dispense:  90 tablet    Refill:  3    Order Specific Question:   Supervising Provider    Answer:   Dorothy Spark [id number]  . nitroGLYCERIN (NITROSTAT) 0.4 MG SL tablet    Sig: Place 1 tablet (0.4 mg total) under the tongue every 5 (five) minutes as needed for chest pain.    Dispense:  25 tablet    Refill:  3    Order Specific Question:   Supervising Provider    Answer:   Dorothy Spark [[id number]]    Disposition:  Follow up with Dr. Harl Bowie in 3 months.   Signed, Erma Heritage, PA-C  04/19/2019 5:22  PM    [facility]

## 2019-04-27 DIAGNOSIS — I712 Thoracic aortic aneurysm, without rupture: Secondary | ICD-10-CM | POA: Diagnosis not present

## 2019-04-27 DIAGNOSIS — R32 Unspecified urinary incontinence: Secondary | ICD-10-CM | POA: Diagnosis not present

## 2019-04-27 DIAGNOSIS — F0151 Vascular dementia with behavioral disturbance: Secondary | ICD-10-CM | POA: Diagnosis not present

## 2019-04-27 DIAGNOSIS — I5043 Acute on chronic combined systolic (congestive) and diastolic (congestive) heart failure: Secondary | ICD-10-CM | POA: Diagnosis not present

## 2019-04-27 DIAGNOSIS — I2510 Atherosclerotic heart disease of native coronary artery without angina pectoris: Secondary | ICD-10-CM | POA: Diagnosis not present

## 2019-04-27 DIAGNOSIS — G214 Vascular parkinsonism: Secondary | ICD-10-CM | POA: Diagnosis not present

## 2019-05-15 DIAGNOSIS — R32 Unspecified urinary incontinence: Secondary | ICD-10-CM | POA: Diagnosis not present

## 2019-05-15 DIAGNOSIS — F0281 Dementia in other diseases classified elsewhere with behavioral disturbance: Secondary | ICD-10-CM | POA: Diagnosis not present

## 2019-05-15 DIAGNOSIS — Z6826 Body mass index (BMI) 26.0-26.9, adult: Secondary | ICD-10-CM | POA: Diagnosis not present

## 2019-05-15 DIAGNOSIS — E039 Hypothyroidism, unspecified: Secondary | ICD-10-CM | POA: Diagnosis not present

## 2019-05-15 DIAGNOSIS — M10041 Idiopathic gout, right hand: Secondary | ICD-10-CM | POA: Diagnosis not present

## 2019-05-15 DIAGNOSIS — D509 Iron deficiency anemia, unspecified: Secondary | ICD-10-CM | POA: Diagnosis not present

## 2019-05-15 DIAGNOSIS — Z0000 Encounter for general adult medical examination without abnormal findings: Secondary | ICD-10-CM | POA: Diagnosis not present

## 2019-05-15 DIAGNOSIS — Z6825 Body mass index (BMI) 25.0-25.9, adult: Secondary | ICD-10-CM | POA: Diagnosis not present

## 2019-05-15 DIAGNOSIS — E876 Hypokalemia: Secondary | ICD-10-CM | POA: Diagnosis not present

## 2019-05-15 DIAGNOSIS — N183 Chronic kidney disease, stage 3 (moderate): Secondary | ICD-10-CM | POA: Diagnosis not present

## 2019-05-15 DIAGNOSIS — M109 Gout, unspecified: Secondary | ICD-10-CM | POA: Diagnosis not present

## 2019-05-15 DIAGNOSIS — I5042 Chronic combined systolic (congestive) and diastolic (congestive) heart failure: Secondary | ICD-10-CM | POA: Diagnosis not present

## 2019-05-15 DIAGNOSIS — G214 Vascular parkinsonism: Secondary | ICD-10-CM | POA: Diagnosis not present

## 2019-05-15 DIAGNOSIS — I2510 Atherosclerotic heart disease of native coronary artery without angina pectoris: Secondary | ICD-10-CM | POA: Diagnosis not present

## 2019-05-15 DIAGNOSIS — R21 Rash and other nonspecific skin eruption: Secondary | ICD-10-CM | POA: Diagnosis not present

## 2019-05-15 DIAGNOSIS — F918 Other conduct disorders: Secondary | ICD-10-CM | POA: Diagnosis not present

## 2019-05-15 DIAGNOSIS — F0151 Vascular dementia with behavioral disturbance: Secondary | ICD-10-CM | POA: Diagnosis not present

## 2019-05-15 DIAGNOSIS — I5043 Acute on chronic combined systolic (congestive) and diastolic (congestive) heart failure: Secondary | ICD-10-CM | POA: Diagnosis not present

## 2019-05-15 DIAGNOSIS — I712 Thoracic aortic aneurysm, without rupture: Secondary | ICD-10-CM | POA: Diagnosis not present

## 2019-05-16 ENCOUNTER — Telehealth: Payer: Self-pay | Admitting: Cardiology

## 2019-05-16 NOTE — Telephone Encounter (Signed)
I spoke with wife, told her he should take as their MD prescribed

## 2019-05-16 NOTE — Telephone Encounter (Signed)
Pt wants to make sure it's ok that he takes Prednisone

## 2019-05-17 DIAGNOSIS — Z9181 History of falling: Secondary | ICD-10-CM | POA: Diagnosis not present

## 2019-05-17 DIAGNOSIS — I5043 Acute on chronic combined systolic (congestive) and diastolic (congestive) heart failure: Secondary | ICD-10-CM | POA: Diagnosis not present

## 2019-05-25 DIAGNOSIS — R21 Rash and other nonspecific skin eruption: Secondary | ICD-10-CM | POA: Diagnosis not present

## 2019-05-30 ENCOUNTER — Telehealth: Payer: Self-pay | Admitting: Cardiology

## 2019-05-30 NOTE — Telephone Encounter (Signed)
Returned call to pt's wife. She states that pt broke out in a terrible rash all over. Dr. [place] Crane discontinued his BIDIL and started him on isosorbide dinitrate 20 mg - tid. She wanted to be sure this was okay from a cardiac perspective. Please advise.

## 2019-05-30 NOTE — Telephone Encounter (Signed)
Pt has a rash all over and PCP Dr. [place] Crane changed his medications. Please give pts wife a call @ [phone]

## 2019-05-30 NOTE — Telephone Encounter (Signed)
Yes, that change is ok from a heart perspective  J Tedric Leeth MD

## 2019-05-30 NOTE — Telephone Encounter (Signed)
Pt's wife notified and voiced understanding.

## 2019-06-05 DIAGNOSIS — R600 Localized edema: Secondary | ICD-10-CM | POA: Diagnosis not present

## 2019-06-05 DIAGNOSIS — R21 Rash and other nonspecific skin eruption: Secondary | ICD-10-CM | POA: Diagnosis not present

## 2019-06-05 DIAGNOSIS — T7840XA Allergy, unspecified, initial encounter: Secondary | ICD-10-CM | POA: Diagnosis not present

## 2019-06-05 DIAGNOSIS — E782 Mixed hyperlipidemia: Secondary | ICD-10-CM | POA: Diagnosis not present

## 2019-06-05 DIAGNOSIS — L308 Other specified dermatitis: Secondary | ICD-10-CM | POA: Diagnosis not present

## 2019-06-06 ENCOUNTER — Telehealth: Payer: Self-pay | Admitting: Cardiology

## 2019-06-06 DIAGNOSIS — I214 Non-ST elevation (NSTEMI) myocardial infarction: Secondary | ICD-10-CM | POA: Diagnosis not present

## 2019-06-06 DIAGNOSIS — E039 Hypothyroidism, unspecified: Secondary | ICD-10-CM | POA: Diagnosis not present

## 2019-06-06 DIAGNOSIS — F419 Anxiety disorder, unspecified: Secondary | ICD-10-CM | POA: Diagnosis not present

## 2019-06-06 DIAGNOSIS — I5043 Acute on chronic combined systolic (congestive) and diastolic (congestive) heart failure: Secondary | ICD-10-CM | POA: Diagnosis not present

## 2019-06-06 DIAGNOSIS — E782 Mixed hyperlipidemia: Secondary | ICD-10-CM | POA: Diagnosis not present

## 2019-06-06 DIAGNOSIS — E876 Hypokalemia: Secondary | ICD-10-CM | POA: Diagnosis not present

## 2019-06-06 DIAGNOSIS — I10 Essential (primary) hypertension: Secondary | ICD-10-CM | POA: Diagnosis not present

## 2019-06-06 DIAGNOSIS — N183 Chronic kidney disease, stage 3 (moderate): Secondary | ICD-10-CM | POA: Diagnosis not present

## 2019-06-06 DIAGNOSIS — I2510 Atherosclerotic heart disease of native coronary artery without angina pectoris: Secondary | ICD-10-CM | POA: Diagnosis not present

## 2019-06-06 DIAGNOSIS — M10041 Idiopathic gout, right hand: Secondary | ICD-10-CM | POA: Diagnosis not present

## 2019-06-06 NOTE — Telephone Encounter (Signed)
Patient has 10 lbs weight gain over past week 177 to 187 lbs   He is taking Lasix 20 mg daily for the last week   Is on Prednisone taper from derm for rash. Wife reports patient is very hungry. I explained to her that prednisone could do this    She said swelling in mainly in feet. Elevates legs.SOB is no different than usual

## 2019-06-06 NOTE — Telephone Encounter (Signed)
Patient's wife calling in regards to patient's legs swelliing. / tg

## 2019-06-07 NOTE — Telephone Encounter (Signed)
Prednisone very commonly can cause the body to retain fluid. If taking lasix 20mg  daily without benefit, increase to 40mg  daily and update [place] on weights on Monday  Zandra Abts MD

## 2019-06-07 NOTE — Telephone Encounter (Signed)
Called pt. No answer, left message for pt to return call.  

## 2019-06-13 DIAGNOSIS — E782 Mixed hyperlipidemia: Secondary | ICD-10-CM | POA: Diagnosis not present

## 2019-06-13 DIAGNOSIS — R21 Rash and other nonspecific skin eruption: Secondary | ICD-10-CM | POA: Diagnosis not present

## 2019-06-13 DIAGNOSIS — R600 Localized edema: Secondary | ICD-10-CM | POA: Diagnosis not present

## 2019-06-14 NOTE — Telephone Encounter (Signed)
Called pt. No answer, left message for pt to return call.  

## 2019-06-16 DIAGNOSIS — Z9181 History of falling: Secondary | ICD-10-CM | POA: Diagnosis not present

## 2019-06-16 DIAGNOSIS — I5043 Acute on chronic combined systolic (congestive) and diastolic (congestive) heart failure: Secondary | ICD-10-CM | POA: Diagnosis not present

## 2019-06-19 DIAGNOSIS — R269 Unspecified abnormalities of gait and mobility: Secondary | ICD-10-CM | POA: Diagnosis not present

## 2019-06-19 DIAGNOSIS — R21 Rash and other nonspecific skin eruption: Secondary | ICD-10-CM | POA: Diagnosis not present

## 2019-06-19 DIAGNOSIS — S21209A Unspecified open wound of unspecified back wall of thorax without penetration into thoracic cavity, initial encounter: Secondary | ICD-10-CM | POA: Diagnosis not present

## 2019-06-19 DIAGNOSIS — R600 Localized edema: Secondary | ICD-10-CM | POA: Diagnosis not present

## 2019-06-19 DIAGNOSIS — E782 Mixed hyperlipidemia: Secondary | ICD-10-CM | POA: Diagnosis not present

## 2019-06-19 DIAGNOSIS — F419 Anxiety disorder, unspecified: Secondary | ICD-10-CM | POA: Diagnosis not present

## 2019-06-26 DIAGNOSIS — R3981 Functional urinary incontinence: Secondary | ICD-10-CM | POA: Diagnosis not present

## 2019-06-26 DIAGNOSIS — R21 Rash and other nonspecific skin eruption: Secondary | ICD-10-CM | POA: Diagnosis not present

## 2019-06-26 DIAGNOSIS — N183 Chronic kidney disease, stage 3 (moderate): Secondary | ICD-10-CM | POA: Diagnosis not present

## 2019-06-26 DIAGNOSIS — R269 Unspecified abnormalities of gait and mobility: Secondary | ICD-10-CM | POA: Diagnosis not present

## 2019-06-26 DIAGNOSIS — M10041 Idiopathic gout, right hand: Secondary | ICD-10-CM | POA: Diagnosis not present

## 2019-06-26 DIAGNOSIS — Z0000 Encounter for general adult medical examination without abnormal findings: Secondary | ICD-10-CM | POA: Diagnosis not present

## 2019-06-26 DIAGNOSIS — E782 Mixed hyperlipidemia: Secondary | ICD-10-CM | POA: Diagnosis not present

## 2019-06-26 DIAGNOSIS — Z6825 Body mass index (BMI) 25.0-25.9, adult: Secondary | ICD-10-CM | POA: Diagnosis not present

## 2019-06-26 DIAGNOSIS — I5042 Chronic combined systolic (congestive) and diastolic (congestive) heart failure: Secondary | ICD-10-CM | POA: Diagnosis not present

## 2019-06-26 DIAGNOSIS — D509 Iron deficiency anemia, unspecified: Secondary | ICD-10-CM | POA: Diagnosis not present

## 2019-06-26 DIAGNOSIS — E876 Hypokalemia: Secondary | ICD-10-CM | POA: Diagnosis not present

## 2019-06-26 DIAGNOSIS — I5043 Acute on chronic combined systolic (congestive) and diastolic (congestive) heart failure: Secondary | ICD-10-CM | POA: Diagnosis not present

## 2019-06-26 DIAGNOSIS — R600 Localized edema: Secondary | ICD-10-CM | POA: Diagnosis not present

## 2019-06-26 DIAGNOSIS — F918 Other conduct disorders: Secondary | ICD-10-CM | POA: Diagnosis not present

## 2019-06-26 DIAGNOSIS — S21209A Unspecified open wound of unspecified back wall of thorax without penetration into thoracic cavity, initial encounter: Secondary | ICD-10-CM | POA: Diagnosis not present

## 2019-06-26 DIAGNOSIS — F0281 Dementia in other diseases classified elsewhere with behavioral disturbance: Secondary | ICD-10-CM | POA: Diagnosis not present

## 2019-06-26 DIAGNOSIS — M109 Gout, unspecified: Secondary | ICD-10-CM | POA: Diagnosis not present

## 2019-06-26 DIAGNOSIS — F419 Anxiety disorder, unspecified: Secondary | ICD-10-CM | POA: Diagnosis not present

## 2019-06-26 DIAGNOSIS — Z6826 Body mass index (BMI) 26.0-26.9, adult: Secondary | ICD-10-CM | POA: Diagnosis not present

## 2019-07-04 DIAGNOSIS — R609 Edema, unspecified: Secondary | ICD-10-CM | POA: Diagnosis not present

## 2019-07-04 DIAGNOSIS — Z7409 Other reduced mobility: Secondary | ICD-10-CM | POA: Diagnosis not present

## 2019-07-04 DIAGNOSIS — N183 Chronic kidney disease, stage 3 (moderate): Secondary | ICD-10-CM | POA: Diagnosis not present

## 2019-07-04 DIAGNOSIS — R21 Rash and other nonspecific skin eruption: Secondary | ICD-10-CM | POA: Diagnosis not present

## 2019-07-11 DIAGNOSIS — M10041 Idiopathic gout, right hand: Secondary | ICD-10-CM | POA: Diagnosis not present

## 2019-07-11 DIAGNOSIS — R32 Unspecified urinary incontinence: Secondary | ICD-10-CM | POA: Diagnosis not present

## 2019-07-11 DIAGNOSIS — D509 Iron deficiency anemia, unspecified: Secondary | ICD-10-CM | POA: Diagnosis not present

## 2019-07-11 DIAGNOSIS — Z6826 Body mass index (BMI) 26.0-26.9, adult: Secondary | ICD-10-CM | POA: Diagnosis not present

## 2019-07-11 DIAGNOSIS — E876 Hypokalemia: Secondary | ICD-10-CM | POA: Diagnosis not present

## 2019-07-11 DIAGNOSIS — F918 Other conduct disorders: Secondary | ICD-10-CM | POA: Diagnosis not present

## 2019-07-11 DIAGNOSIS — N183 Chronic kidney disease, stage 3 (moderate): Secondary | ICD-10-CM | POA: Diagnosis not present

## 2019-07-11 DIAGNOSIS — I5042 Chronic combined systolic (congestive) and diastolic (congestive) heart failure: Secondary | ICD-10-CM | POA: Diagnosis not present

## 2019-07-11 DIAGNOSIS — Z0000 Encounter for general adult medical examination without abnormal findings: Secondary | ICD-10-CM | POA: Diagnosis not present

## 2019-07-11 DIAGNOSIS — Z6825 Body mass index (BMI) 25.0-25.9, adult: Secondary | ICD-10-CM | POA: Diagnosis not present

## 2019-07-11 DIAGNOSIS — R21 Rash and other nonspecific skin eruption: Secondary | ICD-10-CM | POA: Diagnosis not present

## 2019-07-11 DIAGNOSIS — F0281 Dementia in other diseases classified elsewhere with behavioral disturbance: Secondary | ICD-10-CM | POA: Diagnosis not present

## 2019-07-11 DIAGNOSIS — M109 Gout, unspecified: Secondary | ICD-10-CM | POA: Diagnosis not present

## 2019-07-11 DIAGNOSIS — R609 Edema, unspecified: Secondary | ICD-10-CM | POA: Diagnosis not present

## 2019-07-11 DIAGNOSIS — I5043 Acute on chronic combined systolic (congestive) and diastolic (congestive) heart failure: Secondary | ICD-10-CM | POA: Diagnosis not present

## 2019-07-17 DIAGNOSIS — R079 Chest pain, unspecified: Secondary | ICD-10-CM | POA: Diagnosis not present

## 2019-07-17 DIAGNOSIS — R1111 Vomiting without nausea: Secondary | ICD-10-CM | POA: Diagnosis not present

## 2019-07-17 DIAGNOSIS — Z9181 History of falling: Secondary | ICD-10-CM | POA: Diagnosis not present

## 2019-07-17 DIAGNOSIS — Z209 Contact with and (suspected) exposure to unspecified communicable disease: Secondary | ICD-10-CM | POA: Diagnosis not present

## 2019-07-17 DIAGNOSIS — I5043 Acute on chronic combined systolic (congestive) and diastolic (congestive) heart failure: Secondary | ICD-10-CM | POA: Diagnosis not present

## 2019-07-21 ENCOUNTER — Encounter (HOSPITAL_COMMUNITY): Payer: Self-pay | Admitting: *Deleted

## 2019-07-21 ENCOUNTER — Emergency Department (HOSPITAL_COMMUNITY): Payer: PPO

## 2019-07-21 ENCOUNTER — Other Ambulatory Visit: Payer: Self-pay

## 2019-07-21 ENCOUNTER — Inpatient Hospital Stay (HOSPITAL_COMMUNITY)
Admission: EM | Admit: 2019-07-21 | Discharge: 2019-07-27 | DRG: 683 | Disposition: A | Discharge status: Skilled Nursing Facility | Payer: PPO | Attending: Internal Medicine | Admitting: Internal Medicine

## 2019-07-21 DIAGNOSIS — Z87891 Personal history of nicotine dependence: Secondary | ICD-10-CM

## 2019-07-21 DIAGNOSIS — F0281 Dementia in other diseases classified elsewhere with behavioral disturbance: Secondary | ICD-10-CM | POA: Diagnosis not present

## 2019-07-21 DIAGNOSIS — I5042 Chronic combined systolic (congestive) and diastolic (congestive) heart failure: Secondary | ICD-10-CM | POA: Diagnosis not present

## 2019-07-21 DIAGNOSIS — Z66 Do not resuscitate: Secondary | ICD-10-CM | POA: Diagnosis not present

## 2019-07-21 DIAGNOSIS — R197 Diarrhea, unspecified: Secondary | ICD-10-CM

## 2019-07-21 DIAGNOSIS — K219 Gastro-esophageal reflux disease without esophagitis: Secondary | ICD-10-CM | POA: Diagnosis present

## 2019-07-21 DIAGNOSIS — K922 Gastrointestinal hemorrhage, unspecified: Secondary | ICD-10-CM | POA: Diagnosis present

## 2019-07-21 DIAGNOSIS — I252 Old myocardial infarction: Secondary | ICD-10-CM

## 2019-07-21 DIAGNOSIS — I2510 Atherosclerotic heart disease of native coronary artery without angina pectoris: Secondary | ICD-10-CM | POA: Diagnosis not present

## 2019-07-21 DIAGNOSIS — E785 Hyperlipidemia, unspecified: Secondary | ICD-10-CM | POA: Diagnosis present

## 2019-07-21 DIAGNOSIS — Y92009 Unspecified place in unspecified non-institutional (private) residence as the place of occurrence of the external cause: Secondary | ICD-10-CM

## 2019-07-21 DIAGNOSIS — I429 Cardiomyopathy, unspecified: Secondary | ICD-10-CM | POA: Diagnosis not present

## 2019-07-21 DIAGNOSIS — R410 Disorientation, unspecified: Secondary | ICD-10-CM | POA: Diagnosis not present

## 2019-07-21 DIAGNOSIS — F339 Major depressive disorder, recurrent, unspecified: Secondary | ICD-10-CM | POA: Diagnosis not present

## 2019-07-21 DIAGNOSIS — F0280 Dementia in other diseases classified elsewhere without behavioral disturbance: Secondary | ICD-10-CM | POA: Diagnosis not present

## 2019-07-21 DIAGNOSIS — Z8249 Family history of ischemic heart disease and other diseases of the circulatory system: Secondary | ICD-10-CM

## 2019-07-21 DIAGNOSIS — I130 Hypertensive heart and chronic kidney disease with heart failure and stage 1 through stage 4 chronic kidney disease, or unspecified chronic kidney disease: Secondary | ICD-10-CM | POA: Diagnosis present

## 2019-07-21 DIAGNOSIS — R1111 Vomiting without nausea: Secondary | ICD-10-CM | POA: Diagnosis not present

## 2019-07-21 DIAGNOSIS — Z79899 Other long term (current) drug therapy: Secondary | ICD-10-CM

## 2019-07-21 DIAGNOSIS — E860 Dehydration: Secondary | ICD-10-CM | POA: Diagnosis not present

## 2019-07-21 DIAGNOSIS — G20 Parkinson's disease: Secondary | ICD-10-CM | POA: Diagnosis present

## 2019-07-21 DIAGNOSIS — Z743 Need for continuous supervision: Secondary | ICD-10-CM | POA: Diagnosis not present

## 2019-07-21 DIAGNOSIS — R41841 Cognitive communication deficit: Secondary | ICD-10-CM | POA: Diagnosis not present

## 2019-07-21 DIAGNOSIS — R1312 Dysphagia, oropharyngeal phase: Secondary | ICD-10-CM | POA: Diagnosis not present

## 2019-07-21 DIAGNOSIS — Z961 Presence of intraocular lens: Secondary | ICD-10-CM | POA: Diagnosis present

## 2019-07-21 DIAGNOSIS — N179 Acute kidney failure, unspecified: Principal | ICD-10-CM | POA: Diagnosis present

## 2019-07-21 DIAGNOSIS — E039 Hypothyroidism, unspecified: Secondary | ICD-10-CM | POA: Diagnosis not present

## 2019-07-21 DIAGNOSIS — N189 Chronic kidney disease, unspecified: Secondary | ICD-10-CM | POA: Diagnosis not present

## 2019-07-21 DIAGNOSIS — R112 Nausea with vomiting, unspecified: Secondary | ICD-10-CM | POA: Diagnosis not present

## 2019-07-21 DIAGNOSIS — A0472 Enterocolitis due to Clostridium difficile, not specified as recurrent: Secondary | ICD-10-CM | POA: Diagnosis not present

## 2019-07-21 DIAGNOSIS — D500 Iron deficiency anemia secondary to blood loss (chronic): Secondary | ICD-10-CM | POA: Diagnosis not present

## 2019-07-21 DIAGNOSIS — R799 Abnormal finding of blood chemistry, unspecified: Secondary | ICD-10-CM | POA: Diagnosis not present

## 2019-07-21 DIAGNOSIS — I739 Peripheral vascular disease, unspecified: Secondary | ICD-10-CM | POA: Diagnosis not present

## 2019-07-21 DIAGNOSIS — Z79890 Hormone replacement therapy (postmenopausal): Secondary | ICD-10-CM | POA: Diagnosis not present

## 2019-07-21 DIAGNOSIS — M6281 Muscle weakness (generalized): Secondary | ICD-10-CM | POA: Diagnosis not present

## 2019-07-21 DIAGNOSIS — Z20828 Contact with and (suspected) exposure to other viral communicable diseases: Secondary | ICD-10-CM | POA: Diagnosis present

## 2019-07-21 DIAGNOSIS — G20A1 Parkinson's disease without dyskinesia, without mention of fluctuations: Secondary | ICD-10-CM | POA: Diagnosis present

## 2019-07-21 DIAGNOSIS — R011 Cardiac murmur, unspecified: Secondary | ICD-10-CM | POA: Diagnosis present

## 2019-07-21 DIAGNOSIS — K921 Melena: Secondary | ICD-10-CM | POA: Diagnosis not present

## 2019-07-21 DIAGNOSIS — Z7982 Long term (current) use of aspirin: Secondary | ICD-10-CM

## 2019-07-21 DIAGNOSIS — Z9841 Cataract extraction status, right eye: Secondary | ICD-10-CM

## 2019-07-21 DIAGNOSIS — Z8701 Personal history of pneumonia (recurrent): Secondary | ICD-10-CM

## 2019-07-21 DIAGNOSIS — R2689 Other abnormalities of gait and mobility: Secondary | ICD-10-CM | POA: Diagnosis not present

## 2019-07-21 DIAGNOSIS — R195 Other fecal abnormalities: Secondary | ICD-10-CM

## 2019-07-21 DIAGNOSIS — I10 Essential (primary) hypertension: Secondary | ICD-10-CM

## 2019-07-21 DIAGNOSIS — Z9842 Cataract extraction status, left eye: Secondary | ICD-10-CM | POA: Diagnosis not present

## 2019-07-21 DIAGNOSIS — I517 Cardiomegaly: Secondary | ICD-10-CM | POA: Diagnosis not present

## 2019-07-21 DIAGNOSIS — R5381 Other malaise: Secondary | ICD-10-CM | POA: Diagnosis not present

## 2019-07-21 DIAGNOSIS — J811 Chronic pulmonary edema: Secondary | ICD-10-CM | POA: Diagnosis not present

## 2019-07-21 DIAGNOSIS — T3695XA Adverse effect of unspecified systemic antibiotic, initial encounter: Secondary | ICD-10-CM | POA: Diagnosis present

## 2019-07-21 DIAGNOSIS — R1110 Vomiting, unspecified: Secondary | ICD-10-CM | POA: Diagnosis not present

## 2019-07-21 DIAGNOSIS — R404 Transient alteration of awareness: Secondary | ICD-10-CM | POA: Diagnosis not present

## 2019-07-21 DIAGNOSIS — Z03818 Encounter for observation for suspected exposure to other biological agents ruled out: Secondary | ICD-10-CM | POA: Diagnosis not present

## 2019-07-21 DIAGNOSIS — Z9079 Acquired absence of other genital organ(s): Secondary | ICD-10-CM

## 2019-07-21 LAB — RETICULOCYTES
Immature Retic Fract: 10 % (ref 2.3–15.9)
RBC.: 3.9 MIL/uL — ABNORMAL LOW (ref 4.22–5.81)
Retic Count, Absolute: 37.4 10*3/uL (ref 19.0–186.0)
Retic Ct Pct: 1 % (ref 0.4–3.1)

## 2019-07-21 LAB — URINALYSIS, ROUTINE W REFLEX MICROSCOPIC
Bilirubin Urine: NEGATIVE
Glucose, UA: NEGATIVE mg/dL
Hgb urine dipstick: NEGATIVE
Ketones, ur: NEGATIVE mg/dL
Leukocytes,Ua: NEGATIVE
Nitrite: NEGATIVE
Protein, ur: NEGATIVE mg/dL
Specific Gravity, Urine: 1.014 (ref 1.005–1.030)
pH: 5 (ref 5.0–8.0)

## 2019-07-21 LAB — CBC WITH DIFFERENTIAL/PLATELET
Abs Immature Granulocytes: 0.04 10*3/uL (ref 0.00–0.07)
Basophils Absolute: 0 10*3/uL (ref 0.0–0.1)
Basophils Relative: 0 %
Eosinophils Absolute: 0.1 10*3/uL (ref 0.0–0.5)
Eosinophils Relative: 2 %
HCT: 37.4 % — ABNORMAL LOW (ref 39.0–52.0)
Hemoglobin: 12.6 g/dL — ABNORMAL LOW (ref 13.0–17.0)
Immature Granulocytes: 1 %
Lymphocytes Relative: 8 %
Lymphs Abs: 0.7 10*3/uL (ref 0.7–4.0)
MCH: 31.3 pg (ref 26.0–34.0)
MCHC: 33.7 g/dL (ref 30.0–36.0)
MCV: 93 fL (ref 80.0–100.0)
Monocytes Absolute: 1.5 10*3/uL — ABNORMAL HIGH (ref 0.1–1.0)
Monocytes Relative: 19 %
Neutro Abs: 5.7 10*3/uL (ref 1.7–7.7)
Neutrophils Relative %: 70 %
Platelets: 191 10*3/uL (ref 150–400)
RBC: 4.02 MIL/uL — ABNORMAL LOW (ref 4.22–5.81)
RDW: 15.3 % (ref 11.5–15.5)
WBC: 8.1 10*3/uL (ref 4.0–10.5)
nRBC: 0 % (ref 0.0–0.2)

## 2019-07-21 LAB — LACTIC ACID, PLASMA: Lactic Acid, Venous: 0.9 mmol/L (ref 0.5–1.9)

## 2019-07-21 LAB — COMPREHENSIVE METABOLIC PANEL
ALT: 8 U/L (ref 0–44)
AST: 20 U/L (ref 15–41)
Albumin: 4 g/dL (ref 3.5–5.0)
Alkaline Phosphatase: 67 U/L (ref 38–126)
Anion gap: 12 (ref 5–15)
BUN: 73 mg/dL — ABNORMAL HIGH (ref 8–23)
CO2: 23 mmol/L (ref 22–32)
Calcium: 9.7 mg/dL (ref 8.9–10.3)
Chloride: 97 mmol/L — ABNORMAL LOW (ref 98–111)
Creatinine, Ser: 2.25 mg/dL — ABNORMAL HIGH (ref 0.61–1.24)
GFR calc Af Amer: 32 mL/min — ABNORMAL LOW (ref >60)
GFR calc non Af Amer: 27 mL/min — ABNORMAL LOW (ref >60)
Glucose, Bld: 102 mg/dL — ABNORMAL HIGH (ref 70–99)
Potassium: 4.3 mmol/L (ref 3.5–5.1)
Sodium: 132 mmol/L — ABNORMAL LOW (ref 135–145)
Total Bilirubin: 0.6 mg/dL (ref 0.3–1.2)
Total Protein: 8 g/dL (ref 6.5–8.1)

## 2019-07-21 LAB — LIPASE, BLOOD: Lipase: 37 U/L (ref 11–51)

## 2019-07-21 LAB — FERRITIN: Ferritin: 189 ng/mL (ref 24–336)

## 2019-07-21 LAB — TSH: TSH: 5.446 u[IU]/mL — ABNORMAL HIGH (ref 0.350–4.500)

## 2019-07-21 LAB — SARS CORONAVIRUS 2 BY RT PCR (HOSPITAL ORDER, PERFORMED IN ~~LOC~~ HOSPITAL LAB): SARS Coronavirus 2: NEGATIVE

## 2019-07-21 LAB — VITAMIN B12: Vitamin B-12: 822 pg/mL (ref 180–914)

## 2019-07-21 LAB — IRON AND TIBC
Iron: 22 ug/dL — ABNORMAL LOW (ref 45–182)
Saturation Ratios: 8 % — ABNORMAL LOW (ref 17.9–39.5)
TIBC: 271 ug/dL (ref 250–450)
UIBC: 249 ug/dL

## 2019-07-21 LAB — FOLATE: Folate: 81.1 ng/mL (ref >5.9)

## 2019-07-21 LAB — BRAIN NATRIURETIC PEPTIDE: B Natriuretic Peptide: 258 pg/mL — ABNORMAL HIGH (ref 0.0–100.0)

## 2019-07-21 LAB — POC OCCULT BLOOD, ED: Fecal Occult Bld: POSITIVE — AB

## 2019-07-21 MED ORDER — SODIUM CHLORIDE 0.9 % IV SOLN
INTRAVENOUS | Status: DC
  Administered 2019-07-21 – 2019-07-26 (×4): via INTRAVENOUS

## 2019-07-21 MED ORDER — NITROGLYCERIN 0.4 MG SL SUBL: 0.4000 mg | SUBLINGUAL_TABLET | SUBLINGUAL | Status: DC | PRN

## 2019-07-21 MED ORDER — AMLODIPINE BESYLATE 5 MG PO TABS
5.0000 mg | ORAL_TABLET | Freq: Every day | ORAL | Status: DC
  Administered 2019-07-22 – 2019-07-27 (×6): 5 mg via ORAL
  Filled 2019-07-21 (×6): qty 1

## 2019-07-21 MED ORDER — LEVOTHYROXINE SODIUM 100 MCG PO TABS
100.0000 ug | ORAL_TABLET | Freq: Every day | ORAL | Status: DC
  Administered 2019-07-22 – 2019-07-27 (×6): 100 ug via ORAL
  Filled 2019-07-21 (×6): qty 1

## 2019-07-21 MED ORDER — ONDANSETRON HCL 4 MG PO TABS: 4.0000 mg | ORAL_TABLET | Freq: Four times a day (QID) | ORAL | Status: DC | PRN

## 2019-07-21 MED ORDER — ACETAMINOPHEN 650 MG RE SUPP: 650.0000 mg | Freq: Four times a day (QID) | RECTAL | Status: DC | PRN

## 2019-07-21 MED ORDER — ONDANSETRON HCL 4 MG/2ML IJ SOLN
4.0000 mg | Freq: Four times a day (QID) | INTRAMUSCULAR | Status: DC | PRN
  Administered 2019-07-26: 02:00:00 4 mg via INTRAVENOUS
  Filled 2019-07-21: qty 2

## 2019-07-21 MED ORDER — CARVEDILOL 12.5 MG PO TABS
12.5000 mg | ORAL_TABLET | Freq: Two times a day (BID) | ORAL | Status: DC
  Administered 2019-07-22 – 2019-07-27 (×11): 12.5 mg via ORAL
  Filled 2019-07-21 (×11): qty 1

## 2019-07-21 MED ORDER — DIPHENOXYLATE-ATROPINE 2.5-0.025 MG PO TABS
1.0000 | ORAL_TABLET | Freq: Four times a day (QID) | ORAL | Status: DC | PRN
  Administered 2019-07-21: 1 via ORAL
  Filled 2019-07-21: qty 1

## 2019-07-21 MED ORDER — FERROUS SULFATE 325 (65 FE) MG PO TABS
325.0000 mg | ORAL_TABLET | Freq: Every day | ORAL | Status: DC
  Administered 2019-07-22: 325 mg via ORAL
  Filled 2019-07-21 (×4): qty 1

## 2019-07-21 MED ORDER — DONEPEZIL HCL 5 MG PO TABS
10.0000 mg | ORAL_TABLET | Freq: Every day | ORAL | Status: DC
  Administered 2019-07-21 – 2019-07-26 (×6): 10 mg via ORAL
  Filled 2019-07-21 (×9): qty 2

## 2019-07-21 MED ORDER — FOLIC ACID 1 MG PO TABS
1.0000 mg | ORAL_TABLET | Freq: Every day | ORAL | Status: DC
  Administered 2019-07-21 – 2019-07-26 (×6): 1 mg via ORAL
  Filled 2019-07-21 (×6): qty 1

## 2019-07-21 MED ORDER — ISOSORBIDE DINITRATE 20 MG PO TABS
20.0000 mg | ORAL_TABLET | Freq: Three times a day (TID) | ORAL | Status: DC
  Administered 2019-07-21 – 2019-07-27 (×17): 20 mg via ORAL
  Filled 2019-07-21 (×26): qty 1

## 2019-07-21 MED ORDER — PANTOPRAZOLE SODIUM 40 MG IV SOLR
40.0000 mg | Freq: Two times a day (BID) | INTRAVENOUS | Status: DC
  Administered 2019-07-21 – 2019-07-25 (×8): 40 mg via INTRAVENOUS
  Filled 2019-07-21 (×8): qty 40

## 2019-07-21 MED ORDER — DULOXETINE HCL 60 MG PO CPEP
60.0000 mg | ORAL_CAPSULE | Freq: Every day | ORAL | Status: DC
  Administered 2019-07-21 – 2019-07-26 (×6): 60 mg via ORAL
  Filled 2019-07-21 (×6): qty 1

## 2019-07-21 MED ORDER — ONDANSETRON HCL 4 MG/2ML IJ SOLN
4.0000 mg | Freq: Once | INTRAMUSCULAR | Status: AC
  Administered 2019-07-21: 4 mg via INTRAVENOUS
  Filled 2019-07-21: qty 2

## 2019-07-21 MED ORDER — ATORVASTATIN CALCIUM 40 MG PO TABS
40.0000 mg | ORAL_TABLET | Freq: Every day | ORAL | Status: DC
  Administered 2019-07-21 – 2019-07-25 (×5): 40 mg via ORAL
  Filled 2019-07-21 (×5): qty 1

## 2019-07-21 MED ORDER — HALOPERIDOL LACTATE 5 MG/ML IJ SOLN: 1.0000 mg | Freq: Four times a day (QID) | INTRAMUSCULAR | Status: DC | PRN

## 2019-07-21 MED ORDER — ADULT MULTIVITAMIN W/MINERALS CH
1.0000 | ORAL_TABLET | Freq: Every morning | ORAL | Status: DC
  Administered 2019-07-22 – 2019-07-27 (×6): 1 via ORAL
  Filled 2019-07-21 (×6): qty 1

## 2019-07-21 MED ORDER — CARBIDOPA-LEVODOPA 25-100 MG PO TABS
1.0000 | ORAL_TABLET | Freq: Two times a day (BID) | ORAL | Status: DC
  Administered 2019-07-21 – 2019-07-27 (×12): 1 via ORAL
  Filled 2019-07-21 (×19): qty 1

## 2019-07-21 MED ORDER — RASAGILINE MESYLATE 1 MG PO TABS
1.0000 mg | ORAL_TABLET | Freq: Every day | ORAL | Status: DC
  Administered 2019-07-24 – 2019-07-27 (×4): 1 mg via ORAL
  Filled 2019-07-21 (×8): qty 1

## 2019-07-21 MED ORDER — LORAZEPAM 2 MG/ML IJ SOLN: 0.5000 mg | INTRAMUSCULAR | Status: DC | PRN

## 2019-07-21 MED ORDER — ACETAMINOPHEN 325 MG PO TABS
650.0000 mg | ORAL_TABLET | Freq: Four times a day (QID) | ORAL | Status: DC | PRN
  Administered 2019-07-23 – 2019-07-27 (×2): 650 mg via ORAL
  Filled 2019-07-21 (×2): qty 2

## 2019-07-21 MED ORDER — SODIUM CHLORIDE 0.9 % IV BOLUS
500.0000 mL | Freq: Once | INTRAVENOUS | Status: AC
  Administered 2019-07-21: 11:00:00 500 mL via INTRAVENOUS

## 2019-07-21 NOTE — H&P (Signed)
History and Physical  [name] WER:[id number] DOB: 1941-06-16 DOA: 07/21/2019  Referring physician: Rogene Houston MD   PCP: Celene Squibb, MD   Chief Complaint: diarrhea/black stools  HPI: [name] is a 78 y.o. male with cardiomyopathy (EF 30-35%), Parkinson's disease and associated dementia was brought to the emergency department by [facility] with intermittent vomiting and diarrhea for 4 days.  Family reports that he is been having black stools.  He has been more agitated at times.  He has had complaints of abdominal pain and nausea.  He is not eating and drinking well.  He has lost weight over the past 3 to 4 days.  His family is having a difficult time caring for him at home with this acute illness.  There have been no known sick contacts.  He has not had a fever.  Family reports that they have noticed that he has had large black watery stools at home.  No recent antibiotic usage.  ED course: The patient was noted to be Hemoccult positive.  He had multiple watery black stools in the ED.  Patient was afebrile with a heart rate of 77 and blood pressure of 145/68.  Pulse ox 97% on room air.  Glucose 102.  BNP 258.0.  SARS coronavirus test negative.  Blood cultures obtained.  CT of the abdomen and pelvis with no acute findings to explain symptoms.  See full report below.  Sodium 132.  Creatinine 2.25, BUN 73.  Hemoglobin 12.6 platelet count 191.  Lactic acid 0.9.  Chest x-ray with no acute findings.  EKG normal sinus rhythm.  Review of Systems: Unable to obtain due to dementia.  Past Medical History:  Diagnosis Date  . Anxiety   . CAD (coronary artery disease)    a. 01/2019: cath showing severe multivessel disease with 100% CTO of mid RCA, 50% proximal circumflex with 90% AV groove followed by 100% CTO of LPL (that recanalizes through left left lateral), 90 to 95% bifurcation LAD-2nd Diag followed by 60% and then tandem 99 to 90% lesions in the mid to distal LAD. Not candidate for CABG or PCI.  Med management recommended.   . Cardiomyopathy ([place])   . Chronic combined systolic and diastolic CHF (congestive heart failure) ([place])   . Dementia ([place])   . GERD (gastroesophageal reflux disease)   . Hyperlipidemia   . Hypertension   . Incontinence   . 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  . Myocardial infarction ([place])   . Neuromuscular disorder ([place])   . Parkinson's disease ([place])   . Peripheral vascular disease [facility])    Past Surgical History:  Procedure Laterality Date  . BLADDER REPAIR     pt sts "when I had my prostatectomy they cut too much and I have an internal button I have to press in order to release my urine"  . BLADDER SURGERY     2010  pump placed   . CATARACT EXTRACTION W/PHACO  07/28/2011   Procedure: CATARACT EXTRACTION PHACO AND INTRAOCULAR LENS PLACEMENT (IOC);  Surgeon: Elta Guadeloupe T. Gershon Crane;  Location: [facility];  Service: Ophthalmology;  Laterality: Right;  CDE: 20.26  . CATARACT EXTRACTION W/PHACO  09/08/2011   Procedure: CATARACT EXTRACTION PHACO AND INTRAOCULAR LENS PLACEMENT (IOC);  Surgeon: Elta Guadeloupe T. Gershon Crane;  Location: [facility];  Service: Ophthalmology;  Laterality: Left;  CDE:37.31  . ENDARTERECTOMY Right 10/07/2016   Procedure: RIGHT ENDARTERECTOMY CAROTID WITH LIGATION OF INTERNAL CAROTID;  Surgeon: Serafina Mitchell, MD;  Location:  [facility] OR;  Service: Vascular;  Laterality: Right;  . LEFT HEART CATH AND CORONARY ANGIOGRAPHY N/A 02/10/2019   Procedure: LEFT HEART CATH AND CORONARY ANGIOGRAPHY;  Surgeon: Leonie Man, MD;  Location: [facility];  Service: Cardiovascular;  Laterality: N/A;  . PATCH ANGIOPLASTY Right 10/07/2016   Procedure: PATCH ANGIOPLASTY USING Rueben Bash BIOLOGIC PATCH;  Surgeon: Serafina Mitchell, MD;  Location: [place];  Service: Vascular;  Laterality: Right;  . PROSTATECTOMY    . TEE WITHOUT CARDIOVERSION N/A 06/10/2018   Procedure: TRANSESOPHAGEAL ECHOCARDIOGRAM (TEE);  Surgeon: Arnoldo Lenis, MD;  Location: [facility];   Service: Endoscopy;  Laterality: N/A;  . YAG LASER APPLICATION  24/58/0998   Procedure: YAG LASER APPLICATION;  Surgeon: Elta Guadeloupe T. Gershon Crane, MD;  Location: [facility];  Service: Ophthalmology;  Laterality: Right;   Social History:  reports that he quit smoking about 36 years ago. His smoking use included cigarettes. He has a 5.00 pack-year smoking history. He has never used smokeless tobacco. He reports that he does not drink alcohol or use drugs.  No Known Allergies  Family History  Problem Relation Age of Onset  . Cancer Mother   . Cancer Father   . Heart Problems Brother        has PPM  . Anesthesia problems Neg Hx   . Hypotension Neg Hx   . Malignant hyperthermia Neg Hx   . Pseudochol deficiency Neg Hx     Prior to Admission medications   Medication Sig Start Date End Date Taking? Authorizing Provider  acetaminophen (TYLENOL) 650 MG CR tablet Take 650-1,300 mg by mouth daily as needed for pain.    Yes [provider]  amLODipine (NORVASC) 5 MG tablet Take 1 tablet (5 mg total) by mouth daily. 04/19/19  Yes Strader, Fransisco Hertz, PA-C  aspirin EC 81 MG tablet Take 81 mg by mouth daily.   Yes [provider]  atorvastatin (LIPITOR) 40 MG tablet Take 1 tablet (40 mg total) by mouth daily at 6 PM. 04/19/19  Yes Strader, [place] M, PA-C  carbidopa-levodopa (SINEMET) 25-100 MG per tablet Take 1 tablet by mouth 2 (two) times daily.    Yes [provider]  carvedilol (COREG) 12.5 MG tablet Take 1 tablet (12.5 mg total) by mouth 2 (two) times daily with a meal. 03/07/19  Yes Strader, [place] M, PA-C  docusate sodium (COLACE) 100 MG capsule Take 1 capsule (100 mg total) by mouth 2 (two) times daily. 02/14/19  Yes Danford, Suann Larry, MD  donepezil (ARICEPT) 10 MG tablet Take 10 mg by mouth at bedtime.   Yes [provider]  DULoxetine (CYMBALTA) 60 MG capsule Take 60 mg by mouth at bedtime.    Yes [provider]  famotidine (PEPCID) 20 MG tablet Take 20 mg  by mouth 2 (two) times daily.   Yes [provider]  ferrous sulfate 325 (65 FE) MG tablet Take 1 tablet (325 mg total) by mouth 2 (two) times daily with a meal. 02/14/19  Yes Danford, Suann Larry, MD  folic acid (FOLVITE) 1 MG tablet Take 1 mg by mouth at bedtime.    Yes [provider]  furosemide (LASIX) 20 MG tablet Take 1 tablet (20 mg total) by mouth daily as needed (for weight gain greater than 3 pounds overnight or lower extremity edema). 03/07/19  Yes Strader, [place] M, PA-C  hydrOXYzine (ATARAX/VISTARIL) 25 MG tablet Take 25 mg by mouth 3 (three) times daily.    Yes [provider]  isosorbide dinitrate (ISORDIL) 20 MG tablet Take 20 mg by mouth 3 (three) times daily.   Yes [provider]  levothyroxine (SYNTHROID) 100 MCG tablet  04/04/19  Yes [provider]  Multiple Vitamins-Minerals (MULTIVITAMIN WITH MINERALS) tablet Take 1 tablet by mouth every morning.    Yes [provider]  nitroGLYCERIN (NITROSTAT) 0.4 MG SL tablet Place 1 tablet (0.4 mg total) under the tongue every 5 (five) minutes as needed for chest pain. 04/19/19  Yes Strader, [place], PA-C  oxybutynin (DITROPAN) 5 MG tablet Take 5 mg by mouth 2 (two) times daily. 05/07/15  Yes [provider]  potassium chloride SA (K-DUR) 20 MEQ tablet Take 20 mEq by mouth 2 (two) times daily.   Yes [provider]  rasagiline (AZILECT) 1 MG TABS tablet Take 1 tablet by mouth daily. 11/21/18  Yes [provider]   Physical Exam: Vitals:   07/21/19 0951 07/21/19 0952 07/21/19 1200  BP:  133/77 133/71  Pulse:  76 74  Resp:  18 (!) 21  Temp:  98.3 F (36.8 C)   TempSrc:  Oral   SpO2:  97% 100%  Weight: 80.3 kg    Height: 5\' 9"  (1.753 m)      General exam: Elderly male with dementia lying in bed in no apparent distress.  Cooperative.  Head, eyes and ENT: Nontraumatic and normocephalic. Pupils equally reacting to light and accommodation. Oral mucosa  dry.  Neck: Supple. No JVD, carotid bruit or thyromegaly.  Lymphatics: No lymphadenopathy.  Respiratory system: Clear to auscultation. No increased work of breathing.  Cardiovascular system: S1 and S2 heard.  Mild JVD noted.  1+ pedal edema bilateral.  Faint systolic murmur heard.  Gastrointestinal system: Abdomen is nondistended, soft and nontender. Normal bowel sounds heard. No organomegaly or masses appreciated.  Central nervous system: Alert and oriented. No focal neurological deficits.  Extremities: Symmetric 5 x 5 power. Peripheral pulses symmetrically felt.   Skin: No rashes or acute findings.  Musculoskeletal system: Negative exam.  Psychiatry: Pleasant but with dementia.  Labs on Admission:  Basic Metabolic Panel: Recent Labs  Lab 07/21/19 1109  NA 132*  K 4.3  CL 97*  CO2 23  GLUCOSE 102*  BUN 73*  CREATININE 2.25*  CALCIUM 9.7   Liver Function Tests: Recent Labs  Lab 07/21/19 1109  AST 20  ALT 8  ALKPHOS 67  BILITOT 0.6  PROT 8.0  ALBUMIN 4.0   Recent Labs  Lab 07/21/19 1109  LIPASE 37   No results for input(s): AMMONIA in the last 168 hours. CBC: Recent Labs  Lab 07/21/19 1109  WBC 8.1  NEUTROABS 5.7  HGB 12.6*  HCT 37.4*  MCV 93.0  PLT 191   Cardiac Enzymes: No results for input(s): CKTOTAL, CKMB, CKMBINDEX, TROPONINI in the last 168 hours.  BNP (last 3 results) No results for input(s): PROBNP in the last 8760 hours. CBG: No results for input(s): GLUCAP in the last 168 hours.  Radiological Exams on Admission: Ct Abdomen Pelvis Wo Contrast  Result Date: 07/21/2019 CLINICAL DATA:  Intermittent vomiting and diarrhea for 4 days. EXAM: CT ABDOMEN AND PELVIS WITHOUT CONTRAST TECHNIQUE: Multidetector CT imaging of the abdomen and pelvis was performed following the standard protocol without IV contrast. COMPARISON:  CT angiogram of the chest, abdomen and pelvis 02/10/2019. FINDINGS: Lower chest: Punctate calcified granulomata in the lower  lobes bilaterally noted. Mild linear atelectasis or scar left lung base is unchanged. No pleural or pericardial  effusion. Heart size is normal. Hepatobiliary: No focal liver abnormality is seen. No gallstones, gallbladder wall thickening, or biliary dilatation. Pancreas: Unremarkable. No pancreatic ductal dilatation or surrounding inflammatory changes. Spleen: Normal in size. Punctate calcified granulomata within the spleen noted. Adrenals/Urinary Tract: Adrenal glands are unremarkable. Kidneys are normal, without renal calculi, focal lesion, or hydronephrosis. Bladder is unremarkable. Stomach/Bowel: Stomach is within normal limits. Status post appendectomy. No evidence of bowel wall thickening, distention, or inflammatory changes. Vascular/Lymphatic: Aortic atherosclerosis. No enlarged abdominal or pelvic lymph nodes. Reproductive: Status post prostatectomy. Other: None. Musculoskeletal: Avascular necrosis of the femoral heads bilaterally without fragmentation or collapse. No acute bony abnormality. IMPRESSION: No acute abnormality or finding to explain the patient's symptoms. Atherosclerosis. Avascular necrosis of the femoral heads bilaterally without fragmentation or collapse. Electronically Signed   By: Inge Rise M.D.   On: 07/21/2019 13:00   Dg Chest Port 1 View  Result Date: 07/21/2019 CLINICAL DATA:  Intermittent vomiting and diarrhea for 4 days. EXAM: PORTABLE CHEST 1 VIEW COMPARISON:  CT chest, abdomen and pelvis 02/10/2019. Single-view of the chest 06/08/2018 and 02/09/2019. FINDINGS: There is cardiomegaly without edema. Calcified granulomata are seen bilaterally. No consolidative process, pneumothorax or effusion. IMPRESSION: No acute disease. Cardiomegaly. Atherosclerosis. Electronically Signed   By: Inge Rise M.D.   On: 07/21/2019 11:03   Assessment/Plan Active Problems:   Murmur   Dementia due to Parkinson's disease with behavioral disturbance (HCC)   Hyperlipidemia   Essential  hypertension   ARF (acute renal failure) (HCC)   Nausea vomiting and diarrhea   Dehydration   Upper GI bleed   Cardiomyopathy EF 30-35%   Parkinson's disease ([place])   1. Acute renal failure- prerenal secondary to dehydration likely from ongoing diarrhea and upper GI bleeding.  He is clinically very dehydrated and will be treated with IV fluid hydration.  Renally dose medications as appropriate.  Follow renal function panel.  Hold Lasix temporarily. 2. Upper GI bleeding-patient is Hemoccult positive and having diffuse diarrhea.  IV Protonix ordered BID.  Discontinue aspirin.  GI consultation requested.  Patient unable to tell me if he has had GI endoscopies done in the past. 3. Dehydration-IV fluid hydration but careful given history of cardiomyopathy. 4. Chronic blood loss anemia - Pt is taking daily iron supplementation which is continued.  5. Elevated BUN-likely secondary to upper GI bleeding- treating as above. 6. Parkinson's disease with dementia-resume home medications and follow.  Lorazepam/Haldol ordered as needed.  His family cannot care for him at home anymore and he will need placement.  I have asked the social worker to see him to arrange placement.  PT evaluation requested. 7. Coronary artery disease-resume home cardiac medications. 8. Cardiomyopathy-temporarily holding Lasix but resuming other cardiac medications.  Careful with IV fluid hydration.  DVT Prophylaxis: SCD Code Status: DNR Family Communication: Updated at bedside Disposition Plan: Inpatient  Time spent: inpatient   Irwin Brakeman, MD [facility] How to contact the [facility] Attending or Consulting provider [place] or covering provider during after hours [place], for this patient?  1. Check the care team in [facility] and look for a) attending/consulting [facility] provider listed and b) the [facility] team listed 2. Log into [email or web address] and use [place] universal password to access. If you do not have the password, please  contact the hospital operator. 3. Locate the [facility] provider you are looking for under [facility] and page to a number that you can be directly reached. 4. If you still have difficulty  reaching the provider, please page the [facility] (Director on Call) for the Hospitalists listed on amion for assistance.

## 2019-07-21 NOTE — ED Notes (Signed)
Pt had large black runny bowel movement, edp notified

## 2019-07-21 NOTE — ED Provider Notes (Addendum)
[facility] EMERGENCY DEPARTMENT Provider Note   CSN: [id number] Arrival date & time: 07/21/19  0945    History   Chief Complaint Chief Complaint  Patient presents with   Diarrhea    HPI Yanixan Mellinger is a 78 y.o. male.     Patient sent in by EMS by family.  Patient has a history of dementia.  Family is been struggling to take care of him he is had a complaint of vomiting diarrhea for the past 4 days.  He was combative with EMS when they first arrived but he settled down.  Is been cooperative here.  Patient's wife reported she needs to have him placed nursing facility because she can no longer care for him at home.  Past medical history significant for hypertension past history of coronary artery disease.  Combined systolic diastolic congestive heart failure.  Cardiomyopathy.  Coronary artery disease as mentioned.  And the dementia.      Past Medical History:  Diagnosis Date   Anxiety    CAD (coronary artery disease)    a. 01/2019: cath showing severe multivessel disease with 100% CTO of mid RCA, 50% proximal circumflex with 90% AV groove followed by 100% CTO of LPL (that recanalizes through left left lateral), 90 to 95% bifurcation LAD-2nd Diag followed by 60% and then tandem 99 to 90% lesions in the mid to distal LAD. Not candidate for CABG or PCI. Med management recommended.    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   Myocardial infarction [facility])    Neuromuscular disorder ([place])    Parkinson's disease ([place])    Peripheral vascular disease ([place])     Patient Active Problem List   Diagnosis Date Noted   ARF (acute renal failure) ([place]) 07/21/2019   Nausea vomiting and diarrhea 07/21/2019   Dehydration 07/21/2019   Upper  GI bleed 07/21/2019   Cardiomyopathy EF 30-35%    Parkinson's disease (HCC)    Hypothyroidism    Elevated BUN    Lobar pneumonia ([place]) 02/10/2019   Acute on chronic respiratory failure with hypoxia ([place]) 02/10/2019   NSTEMI (non-ST elevated myocardial infarction) ([place]) 02/10/2019   Acute on chronic combined systolic and diastolic CHF (congestive heart failure) ([place]) 02/10/2019   Non-STEMI (non-ST elevated myocardial infarction) ([place]) 02/10/2019   Chest pain 02/09/2019   Acute on chronic systolic CHF (congestive heart failure) ([place]) 02/09/2019   Acute on chronic combined systolic and diastolic HF (heart failure) ([place])    Dementia due to Parkinson's disease with behavioral disturbance ([place])    Hyperlipidemia    Essential hypertension    CKD (chronic kidney disease) stage 3, GFR 30-59 ml/min (HCC)    Elevated troponin    Hypokalemia    SOB (shortness of breath) 06/08/2018   Symptomatic stenosis of right carotid artery 10/07/2016   Preoperative cardiovascular examination 09/02/2016   Abnormal EKG 09/02/2016   Murmur 09/02/2016   Carotid artery stenosis with cerebral infarction ([place]) 09/02/2016    Past Surgical History:  Procedure Laterality Date   BLADDER REPAIR     pt sts "when I had my prostatectomy they cut too much and I have an internal button I have to press in order to release my urine"   BLADDER SURGERY  2010  pump placed    CATARACT EXTRACTION W/PHACO  07/28/2011   Procedure: CATARACT EXTRACTION PHACO AND INTRAOCULAR LENS PLACEMENT (IOC);  Surgeon: Elta Guadeloupe T. Gershon Crane;  Location: [facility];  Service: Ophthalmology;  Laterality: Right;  CDE: 20.26   CATARACT EXTRACTION W/PHACO  09/08/2011   Procedure: CATARACT EXTRACTION PHACO AND INTRAOCULAR LENS PLACEMENT (IOC);  Surgeon: Elta Guadeloupe T. Gershon Crane;  Location: [facility];  Service: Ophthalmology;  Laterality: Left;  CDE:37.31   ENDARTERECTOMY Right 10/07/2016   Procedure: RIGHT ENDARTERECTOMY CAROTID WITH LIGATION  OF INTERNAL CAROTID;  Surgeon: Serafina Mitchell, MD;  Location: [facility];  Service: Vascular;  Laterality: Right;   LEFT HEART CATH AND CORONARY ANGIOGRAPHY N/A 02/10/2019   Procedure: LEFT HEART CATH AND CORONARY ANGIOGRAPHY;  Surgeon: Leonie Man, MD;  Location: [facility] CV LAB;  Service: Cardiovascular;  Laterality: N/A;   PATCH ANGIOPLASTY Right 10/07/2016   Procedure: PATCH ANGIOPLASTY USING Rueben Bash BIOLOGIC PATCH;  Surgeon: Serafina Mitchell, MD;  Location: [facility] OR;  Service: Vascular;  Laterality: Right;   PROSTATECTOMY     TEE WITHOUT CARDIOVERSION N/A 06/10/2018   Procedure: TRANSESOPHAGEAL ECHOCARDIOGRAM (TEE);  Surgeon: Arnoldo Lenis, MD;  [facility];  Service: Endoscopy;  Laterality: N/A;   YAG LASER APPLICATION  63/14/9702   Procedure: YAG LASER APPLICATION;  Surgeon: Elta Guadeloupe T. Gershon Crane, MD;  Location: [facility];  Service: Ophthalmology;  Laterality: Right;        Home Medications    Prior to Admission medications   Medication Sig Start Date End Date Taking? Authorizing Provider  acetaminophen (TYLENOL) 650 MG CR tablet Take 650-1,300 mg by mouth daily as needed for pain.    Yes [provider]  amLODipine (NORVASC) 5 MG tablet Take 1 tablet (5 mg total) by mouth daily. 04/19/19  Yes Strader, Fransisco Hertz, PA-C  aspirin EC 81 MG tablet Take 81 mg by mouth daily.   Yes [provider]  atorvastatin (LIPITOR) 40 MG tablet Take 1 tablet (40 mg total) by mouth daily at 6 PM. 04/19/19  Yes Strader, [place] M, PA-C  carbidopa-levodopa (SINEMET) 25-100 MG per tablet Take 1 tablet by mouth 2 (two) times daily.    Yes [provider]  carvedilol (COREG) 12.5 MG tablet Take 1 tablet (12.5 mg total) by mouth 2 (two) times daily with a meal. 03/07/19  Yes Strader, [place] M, PA-C  docusate sodium (COLACE) 100 MG capsule Take 1 capsule (100 mg total) by mouth 2 (two) times daily. 02/14/19  Yes Danford, Suann Larry, MD  donepezil (ARICEPT) 10 MG tablet  Take 10 mg by mouth at bedtime.   Yes [provider]  DULoxetine (CYMBALTA) 60 MG capsule Take 60 mg by mouth at bedtime.    Yes [provider]  famotidine (PEPCID) 20 MG tablet Take 20 mg by mouth 2 (two) times daily.   Yes [provider]  ferrous sulfate 325 (65 FE) MG tablet Take 1 tablet (325 mg total) by mouth 2 (two) times daily with a meal. 02/14/19  Yes Danford, Suann Larry, MD  folic acid (FOLVITE) 1 MG tablet Take 1 mg by mouth at bedtime.    Yes [provider]  furosemide (LASIX) 20 MG tablet Take 1 tablet (20 mg total) by mouth daily as needed (for weight gain greater than 3 pounds overnight or lower extremity edema). 03/07/19  Yes Strader, [place] M, PA-C  hydrOXYzine (ATARAX/VISTARIL) 25 MG tablet Take 25 mg by mouth 3 (three) times daily.  Yes [provider]  isosorbide dinitrate (ISORDIL) 20 MG tablet Take 20 mg by mouth 3 (three) times daily.   Yes [provider]  levothyroxine (SYNTHROID) 100 MCG tablet  04/04/19  Yes [provider]  Multiple Vitamins-Minerals (MULTIVITAMIN WITH MINERALS) tablet Take 1 tablet by mouth every morning.    Yes [provider]  nitroGLYCERIN (NITROSTAT) 0.4 MG SL tablet Place 1 tablet (0.4 mg total) under the tongue every 5 (five) minutes as needed for chest pain. 04/19/19  Yes Strader, [place], PA-C  oxybutynin (DITROPAN) 5 MG tablet Take 5 mg by mouth 2 (two) times daily. 05/07/15  Yes [provider]  potassium chloride SA (K-DUR) 20 MEQ tablet Take 20 mEq by mouth 2 (two) times daily.   Yes [provider]  rasagiline (AZILECT) 1 MG TABS tablet Take 1 tablet by mouth daily. 11/21/18  Yes [provider]    Family History Family History  Problem Relation Age of Onset   Cancer Mother    Cancer Father    Heart Problems Brother        has PPM   Anesthesia problems Neg Hx    Hypotension Neg Hx    Malignant hyperthermia Neg Hx     Pseudochol deficiency Neg Hx     Social History Social History   Tobacco Use   Smoking status: Former Smoker    Packs/day: 0.50    Years: 10.00    Pack years: 5.00    Types: Cigarettes    Quit date: 07/24/1983    Years since quitting: 36.0   Smokeless tobacco: Never Used  Substance Use Topics   Alcohol use: No   Drug use: No     Allergies   Patient has no known allergies.   Review of Systems Review of Systems  Unable to perform ROS: Dementia     Physical Exam Updated Vital Signs BP 134/75    Pulse 79    Temp 98.3 F (36.8 C) (Oral)    Resp 15    Ht 1.753 m (5\' 9" )    Wt 80.3 kg    SpO2 99%    BMI 26.14 kg/m   Physical Exam Vitals signs and nursing note reviewed.  Constitutional:      Appearance: Normal appearance. He is well-developed.  HENT:     Head: Normocephalic and atraumatic.  Eyes:     Extraocular Movements: Extraocular movements intact.     Conjunctiva/sclera: Conjunctivae normal.     Pupils: Pupils are equal, round, and reactive to light.  Neck:     Musculoskeletal: Neck supple.  Cardiovascular:     Rate and Rhythm: Normal rate and regular rhythm.     Heart sounds: No murmur.  Pulmonary:     Effort: Pulmonary effort is normal. No respiratory distress.     Breath sounds: Normal breath sounds.  Abdominal:     Palpations: Abdomen is soft.     Tenderness: There is abdominal tenderness.     Comments: Mild diffuse abdominal tenderness.  Genitourinary:    Rectum: Guaiac result positive.  Skin:    General: Skin is warm and dry.  Neurological:     Mental Status: He is alert. Mental status is at baseline.      ED Treatments / Results  Labs (all labs ordered are listed, but only abnormal results are displayed) Labs Reviewed  COMPREHENSIVE METABOLIC PANEL - Abnormal; Notable for the following components:      Result Value   Sodium 132 (*)  Chloride 97 (*)    Glucose, Bld 102 (*)    BUN 73 (*)    Creatinine, Ser 2.25 (*)    GFR calc non  Af Amer 27 (*)    GFR calc Af Amer 32 (*)    All other components within normal limits  CBC WITH DIFFERENTIAL/PLATELET - Abnormal; Notable for the following components:   RBC 4.02 (*)    Hemoglobin 12.6 (*)    HCT 37.4 (*)    Monocytes Absolute 1.5 (*)    All other components within normal limits  BRAIN NATRIURETIC PEPTIDE - Abnormal; Notable for the following components:   B Natriuretic Peptide 258.0 (*)    All other components within normal limits  POC OCCULT BLOOD, ED - Abnormal; Notable for the following components:   Fecal Occult Bld POSITIVE (*)    All other components within normal limits  CULTURE, BLOOD (ROUTINE X 2)  CULTURE, BLOOD (ROUTINE X 2)  SARS CORONAVIRUS 2 (HOSPITAL ORDER, [facility])  LIPASE, BLOOD  LACTIC ACID, PLASMA  URINALYSIS, ROUTINE W REFLEX MICROSCOPIC    EKG EKG Interpretation  Date/Time:  Friday July 21 2019 10:47:57 EDT Ventricular Rate:  77 PR Interval:    QRS Duration: 112 QT Interval:  400 QTC Calculation: 453 R Axis:   50 Text Interpretation:  Sinus rhythm Ventricular premature complex Anterolateral infarct, age indeterminate No significant change since last tracing Confirmed by Fredia Sorrow [phone]) on 07/21/2019 10:52:37 AM   Radiology Ct Abdomen Pelvis Wo Contrast  Result Date: 07/21/2019 CLINICAL DATA:  Intermittent vomiting and diarrhea for 4 days. EXAM: CT ABDOMEN AND PELVIS WITHOUT CONTRAST TECHNIQUE: Multidetector CT imaging of the abdomen and pelvis was performed following the standard protocol without IV contrast. COMPARISON:  CT angiogram of the chest, abdomen and pelvis 02/10/2019. FINDINGS: Lower chest: Punctate calcified granulomata in the lower lobes bilaterally noted. Mild linear atelectasis or scar left lung base is unchanged. No pleural or pericardial effusion. Heart size is normal. Hepatobiliary: No focal liver abnormality is seen. No gallstones, gallbladder wall thickening, or biliary dilatation.  Pancreas: Unremarkable. No pancreatic ductal dilatation or surrounding inflammatory changes. Spleen: Normal in size. Punctate calcified granulomata within the spleen noted. Adrenals/Urinary Tract: Adrenal glands are unremarkable. Kidneys are normal, without renal calculi, focal lesion, or hydronephrosis. Bladder is unremarkable. Stomach/Bowel: Stomach is within normal limits. Status post appendectomy. No evidence of bowel wall thickening, distention, or inflammatory changes. Vascular/Lymphatic: Aortic atherosclerosis. No enlarged abdominal or pelvic lymph nodes. Reproductive: Status post prostatectomy. Other: None. Musculoskeletal: Avascular necrosis of the femoral heads bilaterally without fragmentation or collapse. No acute bony abnormality. IMPRESSION: No acute abnormality or finding to explain the patient's symptoms. Atherosclerosis. Avascular necrosis of the femoral heads bilaterally without fragmentation or collapse. Electronically Signed   By: Inge Rise M.D.   On: 07/21/2019 13:00   Dg Chest Port 1 View  Result Date: 07/21/2019 CLINICAL DATA:  Intermittent vomiting and diarrhea for 4 days. EXAM: PORTABLE CHEST 1 VIEW COMPARISON:  CT chest, abdomen and pelvis 02/10/2019. Single-view of the chest 06/08/2018 and 02/09/2019. FINDINGS: There is cardiomegaly without edema. Calcified granulomata are seen bilaterally. No consolidative process, pneumothorax or effusion. IMPRESSION: No acute disease. Cardiomegaly. Atherosclerosis. Electronically Signed   By: Inge Rise M.D.   On: 07/21/2019 11:03    Procedures Procedures (including critical care time)  Medications Ordered in ED Medications  0.9 %  sodium chloride infusion ( Intravenous New Bag/Given 07/21/19 1109)  sodium chloride 0.9 % bolus 500  mL (500 mLs Intravenous New Bag/Given 07/21/19 1109)  ondansetron (ZOFRAN) injection 4 mg (4 mg Intravenous Given 07/21/19 1202)     Initial Impression / Assessment and Plan / ED Course  I have  reviewed the triage vital signs and the nursing notes.  Pertinent labs & imaging results that were available during my care of the patient were reviewed by me and considered in my medical decision making (see chart for details).        Patient CT scan of the abdomen without any acute findings.  Patient clearly has a component of dementia.  But did complain of nausea and vomiting said he had some diarrhea.  Patient's family has been struggling to take care of him at home.  Due to his dementia apparently got a little combative with EMS but has been very cooperative here.  Family feels that he needs placement.  Stool was heme positive hemoglobin fine BUN and creatinine up and is significant change consistent with acute kidney injury.  Discussed with hospitalist they will admit for the acute kidney injury finding.  And will follow his hemoglobins.  In addition chest x-ray negative for any acute findings.  Patient's COVID testing was negative.  Mental status seems to be baseline according to family.  Final Clinical Impressions(s) / ED Diagnoses   Final diagnoses:  Nausea vomiting and diarrhea  AKI (acute kidney injury) ([place])  Heme positive stool    ED Discharge Orders    None       Fredia Sorrow, MD 07/21/19 1526    Fredia Sorrow, MD 07/21/19 1527

## 2019-07-21 NOTE — ED Triage Notes (Signed)
Pt brought in by [facility] with c/o intermittent vomiting, diarrhea x 4 days. Pt has dementia and was fighting EMS when they arrived. Pt is calm and cooperative at this time. Pt's wife reports she needs to have him placed in a nursing facility because she can no longer care for him at home.

## 2019-07-22 DIAGNOSIS — K922 Gastrointestinal hemorrhage, unspecified: Secondary | ICD-10-CM

## 2019-07-22 DIAGNOSIS — R112 Nausea with vomiting, unspecified: Secondary | ICD-10-CM

## 2019-07-22 DIAGNOSIS — E039 Hypothyroidism, unspecified: Secondary | ICD-10-CM

## 2019-07-22 DIAGNOSIS — R197 Diarrhea, unspecified: Secondary | ICD-10-CM

## 2019-07-22 DIAGNOSIS — R799 Abnormal finding of blood chemistry, unspecified: Secondary | ICD-10-CM

## 2019-07-22 LAB — COMPREHENSIVE METABOLIC PANEL
ALT: 7 U/L (ref 0–44)
AST: 19 U/L (ref 15–41)
Albumin: 3.5 g/dL (ref 3.5–5.0)
Alkaline Phosphatase: 63 U/L (ref 38–126)
Anion gap: 12 (ref 5–15)
BUN: 65 mg/dL — ABNORMAL HIGH (ref 8–23)
CO2: 21 mmol/L — ABNORMAL LOW (ref 22–32)
Calcium: 9.5 mg/dL (ref 8.9–10.3)
Chloride: 102 mmol/L (ref 98–111)
Creatinine, Ser: 1.91 mg/dL — ABNORMAL HIGH (ref 0.61–1.24)
GFR calc Af Amer: 39 mL/min — ABNORMAL LOW (ref >60)
GFR calc non Af Amer: 33 mL/min — ABNORMAL LOW (ref >60)
Glucose, Bld: 103 mg/dL — ABNORMAL HIGH (ref 70–99)
Potassium: 4.1 mmol/L (ref 3.5–5.1)
Sodium: 135 mmol/L (ref 135–145)
Total Bilirubin: 0.7 mg/dL (ref 0.3–1.2)
Total Protein: 7.4 g/dL (ref 6.5–8.1)

## 2019-07-22 LAB — C DIFFICILE QUICK SCREEN W PCR REFLEX
C Diff antigen: POSITIVE — AB
C Diff toxin: NEGATIVE

## 2019-07-22 LAB — CBC
HCT: 35.8 % — ABNORMAL LOW (ref 39.0–52.0)
Hemoglobin: 11.8 g/dL — ABNORMAL LOW (ref 13.0–17.0)
MCH: 31.1 pg (ref 26.0–34.0)
MCHC: 33 g/dL (ref 30.0–36.0)
MCV: 94.2 fL (ref 80.0–100.0)
Platelets: 195 10*3/uL (ref 150–400)
RBC: 3.8 MIL/uL — ABNORMAL LOW (ref 4.22–5.81)
RDW: 15.4 % (ref 11.5–15.5)
WBC: 8.4 10*3/uL (ref 4.0–10.5)
nRBC: 0 % (ref 0.0–0.2)

## 2019-07-22 LAB — MAGNESIUM: Magnesium: 2.2 mg/dL (ref 1.7–2.4)

## 2019-07-22 LAB — PHOSPHORUS: Phosphorus: 4.7 mg/dL — ABNORMAL HIGH (ref 2.5–4.6)

## 2019-07-22 LAB — CLOSTRIDIUM DIFFICILE BY PCR, REFLEXED: Toxigenic C. Difficile by PCR: POSITIVE — AB

## 2019-07-22 LAB — T4, FREE: Free T4: 1.06 ng/dL (ref 0.61–1.12)

## 2019-07-22 MED ORDER — CIPROFLOXACIN IN D5W 400 MG/200ML IV SOLN
400.0000 mg | Freq: Two times a day (BID) | INTRAVENOUS | Status: DC
  Administered 2019-07-22 – 2019-07-23 (×2): 400 mg via INTRAVENOUS
  Filled 2019-07-22 (×3): qty 200

## 2019-07-22 MED ORDER — METRONIDAZOLE IN NACL 5-0.79 MG/ML-% IV SOLN
500.0000 mg | Freq: Three times a day (TID) | INTRAVENOUS | Status: DC
  Administered 2019-07-22 – 2019-07-23 (×3): 500 mg via INTRAVENOUS
  Filled 2019-07-22 (×4): qty 100

## 2019-07-22 NOTE — Consult Note (Signed)
Referring Provider: Irwin Brakeman, MD Primary Care Physician:  Celene Squibb, MD Primary Gastroenterologist:  Dr. Laural Golden  Reason for Consultation:    GI bleed.  HPI:   History is obtained from the chart as limited information is available from the patient.  His wife was called but there was no answer.  Patient is 78 year old Caucasian male with multiple medical problems including cardiomyopathy Parkinson's disease dementia as well as chronic kidney disease who was brought to emergency room by EMS yesterday morning because of 4-day history of vomiting and diarrhea.  Patient's stool has been black.  He had large loose bowel movement in emergency room and was black and tested heme positive.  Patient's hemoglobin was 12.6.  His hemoglobin was 8.3 about 5 months ago.  Hemoglobin from this morning is 11.8. There is no history of peptic ulcer disease.  Patient denies heartburn or dysphagia.  He does complain of pain in both legs when he moves.  He denies abdominal pain. Patient's wife has requested placement in a nursing home because she cannot look after him because of progressive dysphagia. Patient was last hospitalized on February 09, 2019 and discharged on 02/14/2019.  He was admitted for community-acquired pneumonia complicated by non-STEMI.  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  He has 1 brother and sister.  He states he does not have any children.   Past Medical History:  Diagnosis Date  . Anxiety   . CAD (coronary artery disease)    a. 01/2019: cath showing severe multivessel disease with 100% CTO of mid RCA, 50% proximal circumflex with 90% AV groove followed by 100% CTO of LPL (that recanalizes through left left lateral), 90 to 95% bifurcation LAD-2nd Diag followed by 60% and then tandem 99 to 90% lesions in the mid to distal LAD. Not candidate for CABG or PCI. Med management recommended.   . Cardiomyopathy ([place])   . Chronic combined systolic and diastolic CHF  (congestive heart failure) ([place])   . Dementia ([place])   . GERD (gastroesophageal reflux disease)   . Hyperlipidemia   . Hypertension   . Incontinence   . 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  . Myocardial infarction ([place])   . Neuromuscular disorder ([place])   . Parkinson's disease ([place])   . Peripheral vascular disease [facility])     Past Surgical History:  Procedure Laterality Date  . BLADDER REPAIR     pt sts "when I had my prostatectomy they cut too much and I have an internal button I have to press in order to release my urine"  . BLADDER SURGERY     2010  pump placed   . CATARACT EXTRACTION W/PHACO  07/28/2011   Procedure: CATARACT EXTRACTION PHACO AND INTRAOCULAR LENS PLACEMENT (IOC);  Surgeon: Elta Guadeloupe T. Gershon Crane;  Location: [facility];  Service: Ophthalmology;  Laterality: Right;  CDE: 20.26  . CATARACT EXTRACTION W/PHACO  09/08/2011   Procedure: CATARACT EXTRACTION PHACO AND INTRAOCULAR LENS PLACEMENT (IOC);  Surgeon: Elta Guadeloupe T. Gershon Crane;  Location: [facility];  Service: Ophthalmology;  Laterality: Left;  CDE:37.31  . ENDARTERECTOMY Right 10/07/2016   Procedure: RIGHT ENDARTERECTOMY CAROTID WITH LIGATION OF INTERNAL CAROTID;  Surgeon: Serafina Mitchell, MD;  Location: [place];  Service: Vascular;  Laterality: Right;  . LEFT HEART CATH AND CORONARY ANGIOGRAPHY N/A 02/10/2019   Procedure: LEFT HEART CATH AND CORONARY ANGIOGRAPHY;  Surgeon: Leonie Man, MD;  Location: [place] CV LAB;  Service: Cardiovascular;  Laterality: N/A;  . PATCH ANGIOPLASTY Right 10/07/2016   Procedure: PATCH ANGIOPLASTY USING Rueben Bash BIOLOGIC PATCH;  Surgeon: Serafina Mitchell, MD;  Location: [place];  Service: Vascular;  Laterality: Right;  . PROSTATECTOMY    . TEE WITHOUT CARDIOVERSION N/A 06/10/2018   Procedure: TRANSESOPHAGEAL ECHOCARDIOGRAM (TEE);  Surgeon: Arnoldo Lenis, MD;  [facility];  Service: Endoscopy;  Laterality: N/A;  . YAG LASER APPLICATION  61/[id number]/[id number]   Procedure: YAG LASER  APPLICATION;  Surgeon: Elta Guadeloupe T. Gershon Crane, MD;  Location: [facility];  Service: Ophthalmology;  Laterality: Right;    Prior to Admission medications   Medication Sig Start Date End Date Taking? Authorizing Provider  acetaminophen (TYLENOL) 650 MG CR tablet Take 650-1,300 mg by mouth daily as needed for pain.    Yes [provider]  amLODipine (NORVASC) 5 MG tablet Take 1 tablet (5 mg total) by mouth daily. 04/19/19  Yes Strader, Fransisco Hertz, PA-C  aspirin EC 81 MG tablet Take 81 mg by mouth daily.   Yes [provider]  atorvastatin (LIPITOR) 40 MG tablet Take 1 tablet (40 mg total) by mouth daily at 6 PM. 04/19/19  Yes Strader, [place] M, PA-C  carbidopa-levodopa (SINEMET) 25-100 MG per tablet Take 1 tablet by mouth 2 (two) times daily.    Yes [provider]  carvedilol (COREG) 12.5 MG tablet Take 1 tablet (12.5 mg total) by mouth 2 (two) times daily with a meal. 03/07/19  Yes Strader, [place] M, PA-C  docusate sodium (COLACE) 100 MG capsule Take 1 capsule (100 mg total) by mouth 2 (two) times daily. 02/14/19  Yes Danford, Suann Larry, MD  donepezil (ARICEPT) 10 MG tablet Take 10 mg by mouth at bedtime.   Yes [provider]  DULoxetine (CYMBALTA) 60 MG capsule Take 60 mg by mouth at bedtime.    Yes [provider]  famotidine (PEPCID) 20 MG tablet Take 20 mg by mouth 2 (two) times daily.   Yes [provider]  ferrous sulfate 325 (65 FE) MG tablet Take 1 tablet (325 mg total) by mouth 2 (two) times daily with a meal. 02/14/19  Yes Danford, Suann Larry, MD  folic acid (FOLVITE) 1 MG tablet Take 1 mg by mouth at bedtime.    Yes [provider]  furosemide (LASIX) 20 MG tablet Take 1 tablet (20 mg total) by mouth daily as needed (for weight gain greater than 3 pounds overnight or lower extremity edema). 03/07/19  Yes Strader, [place] M, PA-C  hydrOXYzine (ATARAX/VISTARIL) 25 MG tablet Take 25 mg by mouth 3 (three) times daily.    Yes [provider]  isosorbide dinitrate (ISORDIL) 20 MG tablet Take 20 mg by mouth 3 (three) times daily.   Yes [provider]  levothyroxine (SYNTHROID) 100 MCG tablet  04/04/19  Yes [provider]  Multiple Vitamins-Minerals (MULTIVITAMIN WITH MINERALS) tablet Take 1 tablet by mouth every morning.    Yes [provider]  nitroGLYCERIN (NITROSTAT) 0.4 MG SL tablet Place 1 tablet (0.4 mg total) under the tongue every 5 (five) minutes as needed for chest pain. 04/19/19  Yes Strader, [place], PA-C  oxybutynin (DITROPAN) 5 MG tablet Take 5 mg by mouth 2 (two) times daily. 05/07/15  Yes [provider]  potassium chloride SA (K-DUR) 20 MEQ tablet Take 20 mEq by mouth 2 (two) times daily.   Yes [provider]  rasagiline (AZILECT) 1 MG TABS tablet Take 1 tablet by mouth daily. 11/21/18  Yes  [provider]    Current Facility-Administered Medications  Medication Dose Route Frequency Provider Last Rate Last Dose  . 0.9 %  sodium chloride infusion   Intravenous Continuous Johnson, Clanford L, MD 50 mL/hr at 07/21/19 1830    . acetaminophen (TYLENOL) tablet 650 mg  650 mg Oral Q6H PRN Johnson, Clanford L, MD       Or  . acetaminophen (TYLENOL) suppository 650 mg  650 mg Rectal Q6H PRN Johnson, Clanford L, MD      . amLODipine (NORVASC) tablet 5 mg  5 mg Oral Daily Johnson, Clanford L, MD   5 mg at 07/22/19 1035  . atorvastatin (LIPITOR) tablet 40 mg  40 mg Oral q1800 Johnson, Clanford L, MD   40 mg at 07/21/19 1830  . carbidopa-levodopa (SINEMET IR) 25-100 MG per tablet immediate release 1 tablet  1 tablet Oral BID Wynetta Emery, Clanford L, MD   1 tablet at 07/22/19 1035  . carvedilol (COREG) tablet 12.5 mg  12.5 mg Oral BID WC Johnson, Clanford L, MD   12.5 mg at 07/22/19 0800  . diphenoxylate-atropine (LOMOTIL) 2.5-0.025 MG per tablet 1 tablet  1 tablet Oral QID PRN Irwin Brakeman L, MD   1 tablet at 07/21/19 1830  . donepezil (ARICEPT) tablet 10 mg   10 mg Oral QHS Johnson, Clanford L, MD   10 mg at 07/21/19 2140  . DULoxetine (CYMBALTA) DR capsule 60 mg  60 mg Oral QHS Johnson, Clanford L, MD   60 mg at 07/21/19 2140  . ferrous sulfate tablet 325 mg  325 mg Oral Q breakfast Johnson, Clanford L, MD   325 mg at 07/22/19 0800  . folic acid (FOLVITE) tablet 1 mg  1 mg Oral QHS Johnson, Clanford L, MD   1 mg at 07/21/19 2140  . haloperidol lactate (HALDOL) injection 1 mg  1 mg Intravenous Q6H PRN Johnson, Clanford L, MD      . isosorbide dinitrate (ISORDIL) tablet 20 mg  20 mg Oral TID Wynetta Emery, Clanford L, MD   20 mg at 07/22/19 1036  . levothyroxine (SYNTHROID) tablet 100 mcg  100 mcg Oral Q0600 Johnson, Clanford L, MD   100 mcg at 07/22/19 0530  . LORazepam (ATIVAN) injection 0.5 mg  0.5 mg Intravenous Q4H PRN Johnson, Clanford L, MD      . multivitamin with minerals tablet 1 tablet  1 tablet Oral q morning - 10a Johnson, Clanford L, MD   1 tablet at 07/22/19 1035  . nitroGLYCERIN (NITROSTAT) SL tablet 0.4 mg  0.4 mg Sublingual Q5 min PRN Johnson, Clanford L, MD      . ondansetron (ZOFRAN) tablet 4 mg  4 mg Oral Q6H PRN Johnson, Clanford L, MD       Or  . ondansetron (ZOFRAN) injection 4 mg  4 mg Intravenous Q6H PRN Johnson, Clanford L, MD      . pantoprazole (PROTONIX) injection 40 mg  40 mg Intravenous Q12H Johnson, Clanford L, MD   40 mg at 07/22/19 1035  . rasagiline (AZILECT) tablet 1 mg  1 mg Oral Daily Johnson, Clanford L, MD        Allergies as of 07/21/2019  . (No Known Allergies)    Family History  Problem Relation Age of Onset  . Cancer Mother   . Cancer Father   . Heart Problems Brother        has PPM  . Anesthesia problems Neg Hx   . Hypotension Neg Hx   . Malignant  hyperthermia Neg Hx   . Pseudochol deficiency Neg Hx     Social History   Socioeconomic History  . Marital status: Married    Spouse name: Not on file  . Number of children: Not on file  . Years of education: Not on file  . Highest education level:  Not on file  Occupational History  . Not on file  Social Needs  . Financial resource strain: Not on file  . Food insecurity    Worry: Not on file    Inability: Not on file  . Transportation needs    Medical: Not on file    Non-medical: Not on file  Tobacco Use  . Smoking status: Former Smoker    Packs/day: 0.50    Years: 10.00    Pack years: 5.00    Types: Cigarettes    Quit date: 07/24/1983    Years since quitting: 36.0  . Smokeless tobacco: Never Used  Substance and Sexual Activity  . Alcohol use: No  . Drug use: No  . Sexual activity: Never  Lifestyle  . Physical activity    Days per week: Not on file    Minutes per session: Not on file  . Stress: Not on file  Relationships  . Social Herbalist on phone: Not on file    Gets together: Not on file    Attends religious service: Not on file    Active member of club or organization: Not on file    Attends meetings of clubs or organizations: Not on file    Relationship status: Not on file  . Intimate partner violence    Fear of current or ex partner: Not on file    Emotionally abused: Not on file    Physically abused: Not on file    Forced sexual activity: Not on file  Other Topics Concern  . Not on file  Social History Narrative  . Not on file    Review of Systems: See HPI, otherwise normal ROS  Physical Exam: Temp:  [98.2 F (36.8 C)-98.4 F (36.9 C)] 98.2 F (36.8 C) (08/08 0512) Pulse Rate:  [73-83] 78 (08/08 0512) Resp:  [13-22] 18 (08/07 2030) BP: (126-153)/(68-82) 126/71 (08/08 0512) SpO2:  [97 %-100 %] 99 % (08/08 0512) Weight:  [80.7 kg-80.9 kg] 80.9 kg (08/08 0500) Last BM Date: 07/21/19  Patient is alert and responds appropriately to questions. He did not know that he was in the hospital. Conjunctiva is pink.  Sclerae nonicteric. Oropharyngeal mucosa is dry.  He has upper and lower dentures in place. Cardiac exam with regular rhythm normal S1-S2.  No murmur or gallop  noted. Auscultation of lungs reveal vesicular breath sounds bilaterally. Abdomen is symmetrical.  Bowel sounds are normal.  Palpation reveals moderate tenderness across lower abdomen more so in left lower quadrant with some guarding.  No tenderness noted across upper abdomen.  No organomegaly or masses. Patient has [place] catheter in place per Patient had bowel movement during my exam.  Stool was liquid brown with very strong odor. He has minimal edema to dorsal aspect of both feet. He is slowly able to bend his knees but unable to raise lrgs off the bed.   Lab Results: Recent Labs    07/21/19 1109 07/22/19 0654  WBC 8.1 8.4  HGB 12.6* 11.8*  HCT 37.4* 35.8*  PLT 191 195   BMET Recent Labs    07/21/19 1109 07/22/19 0654  NA 132* 135  K 4.3 4.1  CL 97* 102  CO2 23 21*  GLUCOSE 102* 103*  BUN 73* 65*  CREATININE 2.25* 1.91*  CALCIUM 9.7 9.5   LFT Recent Labs    07/22/19 0654  PROT 7.4  ALBUMIN 3.5  AST 19  ALT 7  ALKPHOS 63  BILITOT 0.7   PT/INR No results for input(s): LABPROT, INR in the last 72 hours. Hepatitis Panel No results for input(s): HEPBSAG, HCVAB, HEPAIGM, HEPBIGM in the last 72 hours.  Studies/Results: Ct Abdomen Pelvis Wo Contrast  Result Date: 07/21/2019 CLINICAL DATA:  Intermittent vomiting and diarrhea for 4 days. EXAM: CT ABDOMEN AND PELVIS WITHOUT CONTRAST TECHNIQUE: Multidetector CT imaging of the abdomen and pelvis was performed following the standard protocol without IV contrast. COMPARISON:  CT angiogram of the chest, abdomen and pelvis 02/10/2019. FINDINGS: Lower chest: Punctate calcified granulomata in the lower lobes bilaterally noted. Mild linear atelectasis or scar left lung base is unchanged. No pleural or pericardial effusion. Heart size is normal. Hepatobiliary: No focal liver abnormality is seen. No gallstones, gallbladder wall thickening, or biliary dilatation. Pancreas: Unremarkable. No pancreatic ductal dilatation or surrounding  inflammatory changes. Spleen: Normal in size. Punctate calcified granulomata within the spleen noted. Adrenals/Urinary Tract: Adrenal glands are unremarkable. Kidneys are normal, without renal calculi, focal lesion, or hydronephrosis. Bladder is unremarkable. Stomach/Bowel: Stomach is within normal limits. Status post appendectomy. No evidence of bowel wall thickening, distention, or inflammatory changes. Vascular/Lymphatic: Aortic atherosclerosis. No enlarged abdominal or pelvic lymph nodes. Reproductive: Status post prostatectomy. Other: None. Musculoskeletal: Avascular necrosis of the femoral heads bilaterally without fragmentation or collapse. No acute bony abnormality. IMPRESSION: No acute abnormality or finding to explain the patient's symptoms. Atherosclerosis. Avascular necrosis of the femoral heads bilaterally without fragmentation or collapse. Electronically Signed   By: Inge Rise M.D.   On: 07/21/2019 13:00   Dg Chest Port 1 View  Result Date: 07/21/2019 CLINICAL DATA:  Intermittent vomiting and diarrhea for 4 days. EXAM: PORTABLE CHEST 1 VIEW COMPARISON:  CT chest, abdomen and pelvis 02/10/2019. Single-view of the chest 06/08/2018 and 02/09/2019. FINDINGS: There is cardiomegaly without edema. Calcified granulomata are seen bilaterally. No consolidative process, pneumothorax or effusion. IMPRESSION: No acute disease. Cardiomegaly. Atherosclerosis. Electronically Signed   By: Inge Rise M.D.   On: 07/21/2019 11:03   I have reviewed CT images with Dr. Abigail Miyamoto. There is a wall thickening to segments of transverse colon and possibly to sigmoid colon as well.  Assessment;  Patient is 78 year old Caucasian male with multiple medical problems including coronary artery disease cardiomyopathy Parkinson's disease and dementia who presents with 4-day history of vomiting and diarrhea and reported to have black stool.  Stool was guaiac positive.  Review of lab data reveals hemoglobin to be  8.3 g on 02/14/2019 he was hospitalized for pneumonia and non-STEMI.  Hemoglobin has dropped by less than a gram since yesterday. I do not believe his primary process or illnesses acute GI bleed.  I suspect he has acute colitis which would explain his vomiting and diarrhea.  Stool is black because he is on p.o. iron.  Iron studies from prior admission would suggest chronic disease anemia rather than iron deficiency anemia. He certainly could have peptic ulcer disease given that he is on low-dose aspirin and has other risk such as chronic disease.  Recommendations;  Await results of GI pathogen panel. We will also send stool specimen for C. difficile testing. Begin Cipro 500 mg IV every 12 hours metronidazole 500 mg IV every 8 hours. Patient  will be reevaluated in a.m. Further recommendations to follow.   LOS: 1 day      07/22/2019, 12:40 PM

## 2019-07-22 NOTE — Progress Notes (Addendum)
PROGRESS NOTE    [name]  WJX:[id number]  DOB: 18-Jul-1941  DOA: 07/21/2019 PCP: Celene Squibb, MD   Brief Admission Hx: 78 y.o. male with cardiomyopathy (EF 30-35%), Parkinson's disease and associated dementia was brought to the emergency department by [facility] with intermittent vomiting and diarrhea for 4 days with black stools.   MDM/Assessment & Plan:   1. Acute renal failure- creatinine slightly improved with IV fluid hydration.  I suspect prerenal azotemia secondary to dehydration likely from ongoing diarrhea and upper GI bleeding.  Renally dose medications as appropriate.  Follow renal function panel.  Hold Lasix temporarily. 2. Upper GI bleeding-patient is Hemoccult positive and presented with diffuse diarrhea and black stools.  This may be from bleeding versus he is taking iron tablets daily.  IV Protonix ordered BID.  Discontinue aspirin for now.  GI consultation requested.  Patient unable to tell me if he has had GI endoscopies done in the past or who his GI provider is.  Wife says she does not believe he has ever had a GI screen.  3. Dehydration-IV fluid hydration but careful given history of cardiomyopathy. 4. Chronic blood loss anemia - Pt is taking daily iron supplementation which is continued.  5. Elevated BUN-likely secondary to upper GI bleeding- treating as above. 6. Parkinson's disease with dementia-resume home medications and follow.  Lorazepam/Haldol ordered as needed.  His family cannot care for him at home anymore and he will need placement.  I have asked the social worker to see him to arrange placement.  PT evaluation requested. 7. Coronary artery disease-resume home cardiac medications. 8. Cardiomyopathy-temporarily holding Lasix but resuming other cardiac medications.  Careful with IV fluid hydration.  DVT Prophylaxis: SCD Code Status: DNR Family Communication: wife updated by telephone, asked [place] to look into short term SNF placement Disposition Plan:  Inpatient for IV fluids, working on placement  Subjective: Pt says he ate better today.  He is unable to tell me if he is having diarrhea.  He has no specific complaints.   Objective: Vitals:   07/21/19 1900 07/21/19 2030 07/22/19 0500 07/22/19 0512  BP: 134/82 139/69  126/71  Pulse: 76 73  78  Resp: 13 18    Temp:  98.4 F (36.9 C)  98.2 F (36.8 C)  TempSrc:  Oral  Oral  SpO2: 99% 100%  99%  Weight:  80.7 kg 80.9 kg   Height:  5\' 8"  (1.727 m)      Intake/Output Summary (Last 24 hours) at 07/22/2019 1045 Last data filed at 07/22/2019 0655 Gross per 24 hour  Intake 1526.25 ml  Output 3 ml  Net 1523.25 ml   Filed Weights   07/21/19 0951 07/21/19 2030 07/22/19 0500  Weight: 80.3 kg 80.7 kg 80.9 kg   REVIEW OF SYSTEMS  UTO due to dementia  Exam:  General exam: awake, alert, confused, no apparent distress.  Respiratory system: Clear. No increased work of breathing. Cardiovascular system: S1 & S2 heard. No JVD, murmurs, gallops, clicks or pedal edema. Gastrointestinal system: Abdomen is nondistended, soft and nontender. Normal bowel sounds heard. Central nervous system: Alert and oriented. No focal neurological deficits. Extremities: no cyanosis or clubbing.  Data Reviewed: Basic Metabolic Panel: Recent Labs  Lab 07/21/19 1109 07/22/19 0654  NA 132* 135  K 4.3 4.1  CL 97* 102  CO2 23 21*  GLUCOSE 102* 103*  BUN 73* 65*  CREATININE 2.25* 1.91*  CALCIUM 9.7 9.5  MG  --  2.2  PHOS  --  4.7*   Liver Function Tests: Recent Labs  Lab 07/21/19 1109 07/22/19 0654  AST 20 19  ALT 8 7  ALKPHOS 67 63  BILITOT 0.6 0.7  PROT 8.0 7.4  ALBUMIN 4.0 3.5   Recent Labs  Lab 07/21/19 1109  LIPASE 37   No results for input(s): AMMONIA in the last 168 hours. CBC: Recent Labs  Lab 07/21/19 1109 07/22/19 0654  WBC 8.1 8.4  NEUTROABS 5.7  --   HGB 12.6* 11.8*  HCT 37.4* 35.8*  MCV 93.0 94.2  PLT 191 195   Cardiac Enzymes: No results for input(s): CKTOTAL,  CKMB, CKMBINDEX, TROPONINI in the last 168 hours. CBG (last 3)  No results for input(s): GLUCAP in the last 72 hours. Recent Results (from the past 240 hour(s))  Culture, blood (Routine X 2) w Reflex to ID Panel     Status: None (Preliminary result)   Collection Time: 07/21/19 11:11 AM   Specimen: Right Antecubital; Blood  Result Value Ref Range Status   Specimen Description   Final    RIGHT ANTECUBITAL BOTTLES DRAWN AEROBIC AND ANAEROBIC   Special Requests Blood Culture adequate volume  Final   Culture   Final    NO GROWTH < 24 HOURS Performed at [facility], [address] [place], [place] [address]    Report Status PENDING  Incomplete  Culture, blood (Routine X 2) w Reflex to ID Panel     Status: None (Preliminary result)   Collection Time: 07/21/19 11:38 AM   Specimen: BLOOD RIGHT ARM  Result Value Ref Range Status   Specimen Description BLOOD RIGHT ARM  Final   Special Requests   Final    BOTTLES DRAWN AEROBIC AND ANAEROBIC Blood Culture adequate volume   Culture   Final    NO GROWTH < 24 HOURS Performed at [facility], [address] [place], [place] [address]    Report Status PENDING  Incomplete  SARS Coronavirus 2 [facility] order, Performed in [place] hospital lab) Nasopharyngeal Nasopharyngeal Swab     Status: None   Collection Time: 07/21/19 11:48 AM   Specimen: Nasopharyngeal Swab  Result Value Ref Range Status   SARS Coronavirus 2 NEGATIVE NEGATIVE Final    Comment: (NOTE) If result is NEGATIVE SARS-CoV-2 target nucleic acids are NOT DETECTED. The SARS-CoV-2 RNA is generally detectable in upper and lower  respiratory specimens during the acute phase of infection. The lowest  concentration of SARS-CoV-2 viral copies this assay can detect is 250  copies / mL. A negative result does not preclude SARS-CoV-2 infection  and should not be used as the sole basis for treatment or other  patient management decisions.  A negative result may occur with  improper  specimen collection / handling, submission of specimen other  than nasopharyngeal swab, presence of viral mutation(s) within the  areas targeted by this assay, and inadequate number of viral copies  (<250 copies / mL). A negative result must be combined with clinical  observations, patient history, and epidemiological information. If result is POSITIVE SARS-CoV-2 target nucleic acids are DETECTED. The SARS-CoV-2 RNA is generally detectable in upper and lower  respiratory specimens dur ing the acute phase of infection.  Positive  results are indicative of active infection with SARS-CoV-2.  Clinical  correlation with patient history and other diagnostic information is  necessary to determine patient infection status.  Positive results do  not rule out bacterial infection or co-infection with other viruses. If result is PRESUMPTIVE POSTIVE SARS-CoV-2 nucleic  acids MAY BE PRESENT.   A presumptive positive result was obtained on the submitted specimen  and confirmed on repeat testing.  While 2019 novel coronavirus  (SARS-CoV-2) nucleic acids may be present in the submitted sample  additional confirmatory testing may be necessary for epidemiological  and / or clinical management purposes  to differentiate between  SARS-CoV-2 and other Sarbecovirus currently known to infect humans.  If clinically indicated additional testing with an alternate test  methodology [phone]) is advised. The SARS-CoV-2 RNA is generally  detectable in upper and lower respiratory sp ecimens during the acute  phase of infection. The expected result is Negative. Fact Sheet for Patients:  StrictlyIdeas.no Fact Sheet for Healthcare Providers: BankingDealers.co.za This test is not yet approved or cleared by the [place] FDA and has been authorized for detection and/or diagnosis of SARS-CoV-2 by FDA under an Emergency Use Authorization (EUA).  This EUA will remain in  effect (meaning this test can be used) for the duration of the COVID-19 declaration under Section 564(b)(1) of the Act, 21 U.S.C. section 360bbb-3(b)(1), unless the authorization is terminated or revoked sooner. Performed at [facility], [address] [place], [place] [address]      Studies: Ct Abdomen Pelvis Wo Contrast  Result Date: 07/21/2019 CLINICAL DATA:  Intermittent vomiting and diarrhea for 4 days. EXAM: CT ABDOMEN AND PELVIS WITHOUT CONTRAST TECHNIQUE: Multidetector CT imaging of the abdomen and pelvis was performed following the standard protocol without IV contrast. COMPARISON:  CT angiogram of the chest, abdomen and pelvis 02/10/2019. FINDINGS: Lower chest: Punctate calcified granulomata in the lower lobes bilaterally noted. Mild linear atelectasis or scar left lung base is unchanged. No pleural or pericardial effusion. Heart size is normal. Hepatobiliary: No focal liver abnormality is seen. No gallstones, gallbladder wall thickening, or biliary dilatation. Pancreas: Unremarkable. No pancreatic ductal dilatation or surrounding inflammatory changes. Spleen: Normal in size. Punctate calcified granulomata within the spleen noted. Adrenals/Urinary Tract: Adrenal glands are unremarkable. Kidneys are normal, without renal calculi, focal lesion, or hydronephrosis. Bladder is unremarkable. Stomach/Bowel: Stomach is within normal limits. Status post appendectomy. No evidence of bowel wall thickening, distention, or inflammatory changes. Vascular/Lymphatic: Aortic atherosclerosis. No enlarged abdominal or pelvic lymph nodes. Reproductive: Status post prostatectomy. Other: None. Musculoskeletal: Avascular necrosis of the femoral heads bilaterally without fragmentation or collapse. No acute bony abnormality. IMPRESSION: No acute abnormality or finding to explain the patient's symptoms. Atherosclerosis. Avascular necrosis of the femoral heads bilaterally without fragmentation or collapse. Electronically  Signed   By: Inge Rise M.D.   On: 07/21/2019 13:00   Dg Chest Port 1 View  Result Date: 07/21/2019 CLINICAL DATA:  Intermittent vomiting and diarrhea for 4 days. EXAM: PORTABLE CHEST 1 VIEW COMPARISON:  CT chest, abdomen and pelvis 02/10/2019. Single-view of the chest 06/08/2018 and 02/09/2019. FINDINGS: There is cardiomegaly without edema. Calcified granulomata are seen bilaterally. No consolidative process, pneumothorax or effusion. IMPRESSION: No acute disease. Cardiomegaly. Atherosclerosis. Electronically Signed   By: Inge Rise M.D.   On: 07/21/2019 11:03     Scheduled Meds:  amLODipine  5 mg Oral Daily   atorvastatin  40 mg Oral q1800   carbidopa-levodopa  1 tablet Oral BID   carvedilol  12.5 mg Oral BID WC   donepezil  10 mg Oral QHS   DULoxetine  60 mg Oral QHS   ferrous sulfate  325 mg Oral Q breakfast   folic acid  1 mg Oral QHS   isosorbide dinitrate  20 mg Oral TID  levothyroxine  100 mcg Oral Q0600   multivitamin with minerals  1 tablet Oral q morning - 10a   pantoprazole (PROTONIX) IV  40 mg Intravenous Q12H   rasagiline  1 mg Oral Daily   Continuous Infusions:  sodium chloride 50 mL/hr at 07/21/19 1830    Active Problems:   Murmur   Dementia due to Parkinson's disease with behavioral disturbance (HCC)   Hyperlipidemia   Essential hypertension   ARF (acute renal failure) (HCC)   Nausea vomiting and diarrhea   Dehydration   Upper GI bleed   Cardiomyopathy EF 30-35%   Parkinson's disease ([place])   Hypothyroidism   Elevated BUN   Time spent:   Irwin Brakeman, MD [facility] 07/22/2019, 10:45 AM    LOS: 1 day  How to contact the [facility] Attending or Consulting provider [place] or covering provider during after hours [place], for this patient?  1. Check the care team in [facility] and look for a) attending/consulting [facility] provider listed and b) the [facility] team listed 2. Log into [email or web address] and use [place]'s universal password to  access. If you do not have the password, please contact the hospital operator. 3. Locate the [facility] provider you are looking for under [facility] and page to a number that you can be directly reached. 4. If you still have difficulty reaching the provider, please page the [facility] (Director on Call) for the Hospitalists listed on amion for assistance.

## 2019-07-22 NOTE — Progress Notes (Signed)
Telemetry called and informed the patient experienced a 12-13 beat of SVT. Patient assessed in addition to checking the monitor at this time. -patient is noted to be resting in no apparent distress -RR and effort WDP -patient denies discomfort at this time Will continue to monitor

## 2019-07-22 NOTE — Progress Notes (Signed)
Patient had one small BM. Lab was called and to perform the GI panel and Norovirus panel they must have large volume of a BM. Will send stool down for CDiff panel and monitor for patient's next BM for testing needed.

## 2019-07-23 LAB — MAGNESIUM: Magnesium: 2.1 mg/dL (ref 1.7–2.4)

## 2019-07-23 LAB — CBC
HCT: 30.1 % — ABNORMAL LOW (ref 39.0–52.0)
Hemoglobin: 10.2 g/dL — ABNORMAL LOW (ref 13.0–17.0)
MCH: 31.4 pg (ref 26.0–34.0)
MCHC: 33.9 g/dL (ref 30.0–36.0)
MCV: 92.6 fL (ref 80.0–100.0)
Platelets: 176 10*3/uL (ref 150–400)
RBC: 3.25 MIL/uL — ABNORMAL LOW (ref 4.22–5.81)
RDW: 15.1 % (ref 11.5–15.5)
WBC: 6.7 10*3/uL (ref 4.0–10.5)
nRBC: 0 % (ref 0.0–0.2)

## 2019-07-23 LAB — COMPREHENSIVE METABOLIC PANEL
ALT: 7 U/L (ref 0–44)
AST: 16 U/L (ref 15–41)
Albumin: 3.1 g/dL — ABNORMAL LOW (ref 3.5–5.0)
Alkaline Phosphatase: 54 U/L (ref 38–126)
Anion gap: 10 (ref 5–15)
BUN: 60 mg/dL — ABNORMAL HIGH (ref 8–23)
CO2: 20 mmol/L — ABNORMAL LOW (ref 22–32)
Calcium: 8.9 mg/dL (ref 8.9–10.3)
Chloride: 103 mmol/L (ref 98–111)
Creatinine, Ser: 1.75 mg/dL — ABNORMAL HIGH (ref 0.61–1.24)
GFR calc Af Amer: 43 mL/min — ABNORMAL LOW (ref >60)
GFR calc non Af Amer: 37 mL/min — ABNORMAL LOW (ref >60)
Glucose, Bld: 90 mg/dL (ref 70–99)
Potassium: 3.8 mmol/L (ref 3.5–5.1)
Sodium: 133 mmol/L — ABNORMAL LOW (ref 135–145)
Total Bilirubin: 0.6 mg/dL (ref 0.3–1.2)
Total Protein: 6.4 g/dL — ABNORMAL LOW (ref 6.5–8.1)

## 2019-07-23 LAB — PHOSPHORUS: Phosphorus: 3.5 mg/dL (ref 2.5–4.6)

## 2019-07-23 MED ORDER — SACCHAROMYCES BOULARDII 250 MG PO CAPS
250.0000 mg | ORAL_CAPSULE | Freq: Two times a day (BID) | ORAL | Status: DC
  Administered 2019-07-23 – 2019-07-27 (×9): 250 mg via ORAL
  Filled 2019-07-23 (×9): qty 1

## 2019-07-23 MED ORDER — VANCOMYCIN 50 MG/ML ORAL SOLUTION
125.0000 mg | Freq: Four times a day (QID) | ORAL | Status: DC
  Administered 2019-07-23 – 2019-07-27 (×16): 125 mg via ORAL
  Filled 2019-07-23 (×22): qty 2.5

## 2019-07-23 NOTE — Progress Notes (Signed)
  Subjective:  Patient states his diarrhea has slowed.  He had 1 loose stool yesterday while I was in the room.  He has had 2 this morning.  No melena or rectal bleeding reported.  He states he feels better.  He denies abdominal pain.   Objective: Blood pressure 113/67, pulse 66, temperature 97.7 F (36.5 C), temperature source Oral, resp. rate 18, height _0  (1.727 m), weight 80.9 kg, SpO2 97 %. Patient is alert and smiling today. Abdomen is symmetrical.  Bowel sounds are normal.  He has mild tenderness at LLQ.  No organomegaly or masses.  Labs/studies Results:  CBC Latest Ref Rng & Units 07/23/2019 07/22/2019 07/21/2019  WBC 4.0 - 10.5 K/uL 6.7 8.4 8.1  Hemoglobin 13.0 - 17.0 g/dL 10.2(L) 11.8(L) 12.6(L)  Hematocrit 39.0 - 52.0 % 30.1(L) 35.8(L) 37.4(L)  Platelets 150 - 400 K/uL 176 195 191    CMP Latest Ref Rng & Units 07/23/2019 07/22/2019 07/21/2019  Glucose 70 - 99 mg/dL 90 103(H) 102(H)  BUN 8 - 23 mg/dL 60(H) 65(H) 73(H)  Creatinine 0.61 - 1.24 mg/dL 1.75(H) 1.91(H) 2.25(H)  Sodium 135 - 145 mmol/L 133(L) 135 132(L)  Potassium 3.5 - 5.1 mmol/L 3.8 4.1 4.3  Chloride 98 - 111 mmol/L 103 102 97(L)  CO2 22 - 32 mmol/L 20(L) 21(L) 23  Calcium 8.9 - 10.3 mg/dL 8.9 9.5 9.7  Total Protein 6.5 - 8.1 g/dL 6.4(L) 7.4 8.0  Total Bilirubin 0.3 - 1.2 mg/dL 0.6 0.7 0.6  Alkaline Phos 38 - 126 U/L 54 63 67  AST 15 - 41 U/L _1 ALT 0 - 44 U/L _2 Hepatic Function Latest Ref Rng & Units 07/23/2019 07/22/2019 07/21/2019  Total Protein 6.5 - 8.1 g/dL 6.4(L) 7.4 8.0  Albumin 3.5 - 5.0 g/dL 3.1(L) 3.5 4.0  AST 15 - 41 U/L _3 ALT 0 - 44 U/L _4 Alk Phosphatase 38 - 126 U/L 54 63 67  Total Bilirubin 0.3 - 1.2 mg/dL 0.6 0.7 0.6  Bilirubin, Direct 0.0 - 0.2 mg/dL - - -    C. difficile stool antigen and toxin negative. C. difficile positive by PCR.  Assessment:  #1.  Vomiting and diarrhea secondary to acute C. difficile colitis resulting from recent antibiotic use for skin  infection.  GI pathogen panel is pending.  Metronidazole and Cipro will be discontinued and patient will be started on p.o. vancomycin and Saccharomyces Boulardii. I was able to reach patient's wife today and she told me he took an antibiotic for 10 days after he had skin lesion removed.  She does not remember the name.   #2.  Anemia.  Mild anemia appears to be due to acute illness and some loss resulting from colitis.  No evidence of active GI bleed.  #3.  Acute kidney injury.  Renal function is improving with hydration.  #4.  Parkinson's disease and dementia.  #5.  Cardiomyopathy.  Diuretic temporarily on hold.   Recommendations:  DC Cipro and metronidazole. Begin vancomycin 125 mg p.o. 4 times daily/ Florastor 250 mg p.o. twice daily. Advance diet to full liquids.

## 2019-07-23 NOTE — Progress Notes (Signed)
PROGRESS NOTE  [name]  IWP:[id number]  DOB: 02/27/1941  DOA: 07/21/2019 PCP: Celene Squibb, MD  Brief Admission Hx: 78 y.o. male with cardiomyopathy (EF 30-35%), Parkinson's disease and associated dementia was brought to the emergency department by [facility] with intermittent vomiting and diarrhea for 4 days with black stools.   MDM/Assessment & Plan:   1. Acute renal failure- creatinine improved with IV fluid hydration.  Reduce IVFs.  I suspect prerenal azotemia secondary to dehydration likely from ongoing diarrhea.  Follow renal function panel.  Hold Lasix temporarily. 2. C diff colitis - C diff testing positive and with history of recent antibiotic use GI started him on vancomycin and florastor.  3. Dehydration - treated with IV fluid hydration. Reduce rate of IVFs. 4. Chronic blood loss anemia - Pt is taking daily iron supplementation.  5. Elevated BUN - improving with hydration.  6. Parkinson's disease with dementia-resume home medications and follow.  Lorazepam/Haldol ordered as needed.  His family cannot care for him at home anymore and he will need placement.  I have asked the social worker to see him to arrange placement.  PT evaluation requested. 7. Coronary artery disease-resume home cardiac medications. 8. Cardiomyopathy-temporarily holding Lasix but resuming other cardiac medications.  Reduce IVF today.  DVT Prophylaxis: SCD Code Status: DNR Family Communication: wife updated by telephone, asked [place] to look into short term SNF placement Disposition Plan: Inpatient for IV fluids, working on placement  Subjective: Pt says he ate better today.  He had 2 loose stools today.   Objective: Vitals:   07/22/19 0512 07/22/19 2059 07/22/19 2100 07/23/19 0700  BP: 126/71  131/68 113/67  Pulse: 78  63 66  Resp:   16 18  Temp: 98.2 F (36.8 C)  97.6 F (36.4 C) 97.7 F (36.5 C)  TempSrc: Oral  Oral Oral  SpO2: 99% 99% 100% 97%  Weight:      Height:        Intake/Output  Summary (Last 24 hours) at 07/23/2019 1202 Last data filed at 07/23/2019 0600 Gross per 24 hour  Intake 1190 ml  Output 303 ml  Net 887 ml   Filed Weights   07/21/19 0951 07/21/19 2030 07/22/19 0500  Weight: 80.3 kg 80.7 kg 80.9 kg   REVIEW OF SYSTEMS  UTO due to dementia  Exam:  General exam: awake, alert, confused, no apparent distress.  Respiratory system: Clear. No increased work of breathing. Cardiovascular system: S1 & S2 heard. No JVD, murmurs, gallops, clicks or pedal edema. Gastrointestinal system: Abdomen is nondistended, soft and nontender. Normal bowel sounds heard. Central nervous system: Alert and oriented. No focal neurological deficits. Extremities: no cyanosis or clubbing.  Data Reviewed: Basic Metabolic Panel: Recent Labs  Lab 07/21/19 1109 07/22/19 0654 07/23/19 0649  NA 132* 135 133*  K 4.3 4.1 3.8  CL 97* 102 103  CO2 23 21* 20*  GLUCOSE 102* 103* 90  BUN 73* 65* 60*  CREATININE 2.25* 1.91* 1.75*  CALCIUM 9.7 9.5 8.9  MG  --  2.2 2.1  PHOS  --  4.7* 3.5   Liver Function Tests: Recent Labs  Lab 07/21/19 1109 07/22/19 0654 07/23/19 0649  AST 20 19 16   ALT 8 7 7   ALKPHOS 67 63 54  BILITOT 0.6 0.7 0.6  PROT 8.0 7.4 6.4*  ALBUMIN 4.0 3.5 3.1*   Recent Labs  Lab 07/21/19 1109  LIPASE 37   No results for input(s): AMMONIA in the last 168 hours. CBC: Recent  Labs  Lab 07/21/19 1109 07/22/19 0654 07/23/19 0649  WBC 8.1 8.4 6.7  NEUTROABS 5.7  --   --   HGB 12.6* 11.8* 10.2*  HCT 37.4* 35.8* 30.1*  MCV 93.0 94.2 92.6  PLT 191 195 176   Cardiac Enzymes: No results for input(s): CKTOTAL, CKMB, CKMBINDEX, TROPONINI in the last 168 hours. CBG (last 3)  No results for input(s): GLUCAP in the last 72 hours. Recent Results (from the past 240 hour(s))  Culture, blood (Routine X 2) w Reflex to ID Panel     Status: None (Preliminary result)   Collection Time: 07/21/19 11:11 AM   Specimen: Right Antecubital; Blood  Result Value Ref Range  Status   Specimen Description   Final    RIGHT ANTECUBITAL BOTTLES DRAWN AEROBIC AND ANAEROBIC   Special Requests Blood Culture adequate volume  Final   Culture   Final    NO GROWTH 2 DAYS Performed at [facility], [address], [place], [place] [address]    Report Status PENDING  Incomplete  Culture, blood (Routine X 2) w Reflex to ID Panel     Status: None (Preliminary result)   Collection Time: 07/21/19 11:38 AM   Specimen: BLOOD RIGHT ARM  Result Value Ref Range Status   Specimen Description BLOOD RIGHT ARM  Final   Special Requests   Final    BOTTLES DRAWN AEROBIC AND ANAEROBIC Blood Culture adequate volume   Culture   Final    NO GROWTH 2 DAYS Performed at [facility], [address] [place], [place] [address]    Report Status PENDING  Incomplete  SARS Coronavirus 2 [facility] order, Performed in [place] hospital lab) Nasopharyngeal Nasopharyngeal Swab     Status: None   Collection Time: 07/21/19 11:48 AM   Specimen: Nasopharyngeal Swab  Result Value Ref Range Status   SARS Coronavirus 2 NEGATIVE NEGATIVE Final    Comment: (NOTE) If result is NEGATIVE SARS-CoV-2 target nucleic acids are NOT DETECTED. The SARS-CoV-2 RNA is generally detectable in upper and lower  respiratory specimens during the acute phase of infection. The lowest  concentration of SARS-CoV-2 viral copies this assay can detect is 250  copies / mL. A negative result does not preclude SARS-CoV-2 infection  and should not be used as the sole basis for treatment or other  patient management decisions.  A negative result may occur with  improper specimen collection / handling, submission of specimen other  than nasopharyngeal swab, presence of viral mutation(s) within the  areas targeted by this assay, and inadequate number of viral copies  (<250 copies / mL). A negative result must be combined with clinical  observations, patient history, and epidemiological information. If result is POSITIVE  SARS-CoV-2 target nucleic acids are DETECTED. The SARS-CoV-2 RNA is generally detectable in upper and lower  respiratory specimens dur ing the acute phase of infection.  Positive  results are indicative of active infection with SARS-CoV-2.  Clinical  correlation with patient history and other diagnostic information is  necessary to determine patient infection status.  Positive results do  not rule out bacterial infection or co-infection with other viruses. If result is PRESUMPTIVE POSTIVE SARS-CoV-2 nucleic acids MAY BE PRESENT.   A presumptive positive result was obtained on the submitted specimen  and confirmed on repeat testing.  While 2019 novel coronavirus  (SARS-CoV-2) nucleic acids may be present in the submitted sample  additional confirmatory testing may be necessary for epidemiological  and / or clinical management purposes  to differentiate between  SARS-CoV-2 and other Sarbecovirus currently known to infect humans.  If clinically indicated additional testing with an alternate test  methodology [phone]) is advised. The SARS-CoV-2 RNA is generally  detectable in upper and lower respiratory sp ecimens during the acute  phase of infection. The expected result is Negative. Fact Sheet for Patients:  StrictlyIdeas.no Fact Sheet for Healthcare Providers: BankingDealers.co.za This test is not yet approved or cleared by the [place] FDA and has been authorized for detection and/or diagnosis of SARS-CoV-2 by FDA under an Emergency Use Authorization (EUA).  This EUA will remain in effect (meaning this test can be used) for the duration of the COVID-19 declaration under Section 564(b)(1) of the Act, 21 U.S.C. section 360bbb-3(b)(1), unless the authorization is terminated or revoked sooner. Performed at [facility], [address], [place], [place] [address]   C difficile quick scan w PCR reflex     Status: Abnormal   Collection  Time: 07/22/19  1:50 PM   Specimen: STOOL  Result Value Ref Range Status   C Diff antigen POSITIVE (A) NEGATIVE Final   C Diff toxin NEGATIVE NEGATIVE Final   C Diff interpretation Results are indeterminate. See PCR results.  Final    Comment: Performed at [facility], [address], [place], [place] [address]  C. Diff by PCR, Reflexed     Status: Abnormal   Collection Time: 07/22/19  1:50 PM  Result Value Ref Range Status   Toxigenic C. Difficile by PCR POSITIVE (A) NEGATIVE Final    Comment: Positive for toxigenic C. difficile with little to no toxin production. Only treat if clinical presentation suggests symptomatic illness. Performed at [facility], [place] [address] [place], [place] [address]     Studies: Ct Abdomen Pelvis Wo Contrast  Result Date: 07/21/2019 CLINICAL DATA:  Intermittent vomiting and diarrhea for 4 days. EXAM: CT ABDOMEN AND PELVIS WITHOUT CONTRAST TECHNIQUE: Multidetector CT imaging of the abdomen and pelvis was performed following the standard protocol without IV contrast. COMPARISON:  CT angiogram of the chest, abdomen and pelvis 02/10/2019. FINDINGS: Lower chest: Punctate calcified granulomata in the lower lobes bilaterally noted. Mild linear atelectasis or scar left lung base is unchanged. No pleural or pericardial effusion. Heart size is normal. Hepatobiliary: No focal liver abnormality is seen. No gallstones, gallbladder wall thickening, or biliary dilatation. Pancreas: Unremarkable. No pancreatic ductal dilatation or surrounding inflammatory changes. Spleen: Normal in size. Punctate calcified granulomata within the spleen noted. Adrenals/Urinary Tract: Adrenal glands are unremarkable. Kidneys are normal, without renal calculi, focal lesion, or hydronephrosis. Bladder is unremarkable. Stomach/Bowel: Stomach is within normal limits. Status post appendectomy. No evidence of bowel wall thickening, distention, or inflammatory changes. Vascular/Lymphatic: Aortic  atherosclerosis. No enlarged abdominal or pelvic lymph nodes. Reproductive: Status post prostatectomy. Other: None. Musculoskeletal: Avascular necrosis of the femoral heads bilaterally without fragmentation or collapse. No acute bony abnormality. IMPRESSION: No acute abnormality or finding to explain the patient's symptoms. Atherosclerosis. Avascular necrosis of the femoral heads bilaterally without fragmentation or collapse. Electronically Signed   By: Inge Rise M.D.   On: 07/21/2019 13:00   Scheduled Meds: . amLODipine  5 mg Oral Daily  . atorvastatin  40 mg Oral q1800  . carbidopa-levodopa  1 tablet Oral BID  . carvedilol  12.5 mg Oral BID WC  . donepezil  10 mg Oral QHS  . DULoxetine  60 mg Oral QHS  . folic acid  1 mg Oral QHS  . isosorbide dinitrate  20  mg Oral TID  . levothyroxine  100 mcg Oral Q0600  . multivitamin with minerals  1 tablet Oral q morning - 10a  . pantoprazole (PROTONIX) IV  40 mg Intravenous Q12H  . rasagiline  1 mg Oral Daily  . saccharomyces boulardii  250 mg Oral BID  . vancomycin  125 mg Oral QID   Continuous Infusions: . sodium chloride 50 mL/hr at 07/21/19 1830    Active Problems:   Murmur   Dementia due to Parkinson's disease with behavioral disturbance (HCC)   Hyperlipidemia   Essential hypertension   ARF (acute renal failure) (HCC)   Nausea vomiting and diarrhea   Dehydration   Upper GI bleed   Cardiomyopathy EF 30-35%   Parkinson's disease ([place])   Hypothyroidism   Elevated BUN   Time spent:   Irwin Brakeman, MD [facility] 07/23/2019, 12:02 PM    LOS: 2 days  How to contact the [facility] Attending or Consulting provider [place] or covering provider during after hours [place], for this patient?  1. Check the care team in [facility] and look for a) attending/consulting TRH provider listed and b) the [facility] team listed 2. Log into [email or web address] and use [place]'s universal password to access. If you do not have the password, please contact  the hospital operator. 3. Locate the [facility] provider you are looking for under [facility] and page to a number that you can be directly reached. 4. If you still have difficulty reaching the provider, please page the [facility] (Director on Call) for the Hospitalists listed on amion for assistance.

## 2019-07-24 ENCOUNTER — Telehealth: Payer: PPO | Admitting: Cardiology

## 2019-07-24 LAB — CBC
HCT: 29.6 % — ABNORMAL LOW (ref 39.0–52.0)
Hemoglobin: 9.9 g/dL — ABNORMAL LOW (ref 13.0–17.0)
MCH: 31.4 pg (ref 26.0–34.0)
MCHC: 33.4 g/dL (ref 30.0–36.0)
MCV: 94 fL (ref 80.0–100.0)
Platelets: 173 10*3/uL (ref 150–400)
RBC: 3.15 MIL/uL — ABNORMAL LOW (ref 4.22–5.81)
RDW: 15.1 % (ref 11.5–15.5)
WBC: 4.6 10*3/uL (ref 4.0–10.5)
nRBC: 0 % (ref 0.0–0.2)

## 2019-07-24 LAB — COMPREHENSIVE METABOLIC PANEL
ALT: 6 U/L (ref 0–44)
AST: 19 U/L (ref 15–41)
Albumin: 2.9 g/dL — ABNORMAL LOW (ref 3.5–5.0)
Alkaline Phosphatase: 50 U/L (ref 38–126)
Anion gap: 9 (ref 5–15)
BUN: 43 mg/dL — ABNORMAL HIGH (ref 8–23)
CO2: 21 mmol/L — ABNORMAL LOW (ref 22–32)
Calcium: 8.8 mg/dL — ABNORMAL LOW (ref 8.9–10.3)
Chloride: 105 mmol/L (ref 98–111)
Creatinine, Ser: 1.45 mg/dL — ABNORMAL HIGH (ref 0.61–1.24)
GFR calc Af Amer: 54 mL/min — ABNORMAL LOW (ref >60)
GFR calc non Af Amer: 46 mL/min — ABNORMAL LOW (ref >60)
Glucose, Bld: 86 mg/dL (ref 70–99)
Potassium: 3.8 mmol/L (ref 3.5–5.1)
Sodium: 135 mmol/L (ref 135–145)
Total Bilirubin: 0.7 mg/dL (ref 0.3–1.2)
Total Protein: 6.2 g/dL — ABNORMAL LOW (ref 6.5–8.1)

## 2019-07-24 LAB — MAGNESIUM: Magnesium: 2 mg/dL (ref 1.7–2.4)

## 2019-07-24 LAB — PHOSPHORUS: Phosphorus: 3.3 mg/dL (ref 2.5–4.6)

## 2019-07-24 NOTE — Progress Notes (Signed)
PROGRESS NOTE  Elbert Spickler  FIE:[id number]  DOB: 1941-11-16  DOA: 07/21/2019 PCP: Celene Squibb, MD  Brief Admission Hx: 78 y.o. male with cardiomyopathy (EF 30-35%), Parkinson's disease and associated dementia was brought to the emergency department by [facility] with intermittent vomiting and diarrhea for 4 days with black stools.   MDM/Assessment & Plan:   1. Acute renal failure- creatinine improving with IV fluid hydration.  Reduced IVFs.  I suspect prerenal azotemia secondary to dehydration likely from ongoing diarrhea.  Follow renal function panel.  Hold Lasix temporarily. 2. C diff colitis - C diff testing positive and with history of recent antibiotic use GI started him on vancomycin and florastor.  3. Dehydration - treated with IV fluid hydration. Reduced rate of IVFs. 4. Chronic blood loss anemia - Pt is taking daily iron supplementation.  Hg stable.  5. Elevated BUN - improving with hydration.  6. Parkinson's disease with dementia-resume home medications and follow.  Lorazepam/Haldol ordered as needed.  His family cannot care for him at home anymore and he will need placement.  I have asked the social worker to see him to arrange placement.  PT evaluation requested. 7. Coronary artery disease-resume home cardiac medications. 8. Cardiomyopathy-temporarily holding Lasix but resuming other cardiac medications.  Reduced IVF.   DVT Prophylaxis: SCD Code Status: DNR Family Communication: wife updated by telephone, asked [place] to look into short term SNF placement Disposition Plan: SNF 8/11  Subjective: Pt is already feeling better, his abdominal pain/discomfort is improving, he is eating and drinking better.   Objective: Vitals:   07/23/19 2035 07/23/19 2256 07/24/19 0500 07/24/19 0515  BP:  116/60  112/63  Pulse:  70  63  Resp:  20  18  Temp:  98.1 F (36.7 C)  98 F (36.7 C)  TempSrc:  Oral  Oral  SpO2: 97% 95%  97%  Weight:   80.3 kg   Height:        Intake/Output  Summary (Last 24 hours) at 07/24/2019 1214 Last data filed at 07/24/2019 1660 Gross per 24 hour  Intake 240 ml  Output 750 ml  Net -510 ml   Filed Weights   07/21/19 2030 07/22/19 0500 07/24/19 0500  Weight: 80.7 kg 80.9 kg 80.3 kg   REVIEW OF SYSTEMS  UTO due to dementia  Exam:  General exam: awake, alert, confused, no apparent distress.  Respiratory system: Clear. No increased work of breathing. Cardiovascular system: S1 & S2 heard. No JVD, murmurs, gallops, clicks or pedal edema. Gastrointestinal system: Abdomen is nondistended, soft and nontender. Normal bowel sounds heard. Central nervous system: Alert and oriented. No focal neurological deficits. Extremities: no cyanosis or clubbing.  Data Reviewed: Basic Metabolic Panel: Recent Labs  Lab 07/21/19 1109 07/22/19 0654 07/23/19 0649 07/24/19 0603  NA 132* 135 133* 135  K 4.3 4.1 3.8 3.8  CL 97* 102 103 105  CO2 23 21* 20* 21*  GLUCOSE 102* 103* 90 86  BUN 73* 65* 60* 43*  CREATININE 2.25* 1.91* 1.75* 1.45*  CALCIUM 9.7 9.5 8.9 8.8*  MG  --  2.2 2.1 2.0  PHOS  --  4.7* 3.5 3.3   Liver Function Tests: Recent Labs  Lab 07/21/19 1109 07/22/19 0654 07/23/19 0649 07/24/19 0603  AST 20 19 16 19   ALT 8 7 7 6   ALKPHOS 67 63 54 50  BILITOT 0.6 0.7 0.6 0.7  PROT 8.0 7.4 6.4* 6.2*  ALBUMIN 4.0 3.5 3.1* 2.9*   Recent Labs  Lab  07/21/19 1109  LIPASE 37   No results for input(s): AMMONIA in the last 168 hours. CBC: Recent Labs  Lab 07/21/19 1109 07/22/19 0654 07/23/19 0649 07/24/19 0603  WBC 8.1 8.4 6.7 4.6  NEUTROABS 5.7  --   --   --   HGB 12.6* 11.8* 10.2* 9.9*  HCT 37.4* 35.8* 30.1* 29.6*  MCV 93.0 94.2 92.6 94.0  PLT 191 195 176 173   Cardiac Enzymes: No results for input(s): CKTOTAL, CKMB, CKMBINDEX, TROPONINI in the last 168 hours. CBG (last 3)  No results for input(s): GLUCAP in the last 72 hours. Recent Results (from the past 240 hour(s))  Culture, blood (Routine X 2) w Reflex to ID Panel      Status: None (Preliminary result)   Collection Time: 07/21/19 11:11 AM   Specimen: Right Antecubital; Blood  Result Value Ref Range Status   Specimen Description   Final    RIGHT ANTECUBITAL BOTTLES DRAWN AEROBIC AND ANAEROBIC   Special Requests Blood Culture adequate volume  Final   Culture   Final    NO GROWTH 3 DAYS Performed at [facility], [address] [place], [place] [address]    Report Status PENDING  Incomplete  Culture, blood (Routine X 2) w Reflex to ID Panel     Status: None (Preliminary result)   Collection Time: 07/21/19 11:38 AM   Specimen: BLOOD RIGHT ARM  Result Value Ref Range Status   Specimen Description BLOOD RIGHT ARM  Final   Special Requests   Final    BOTTLES DRAWN AEROBIC AND ANAEROBIC Blood Culture adequate volume   Culture   Final    NO GROWTH 3 DAYS Performed at [facility], [address], [place], [place] [address]    Report Status PENDING  Incomplete  SARS Coronavirus 2 [facility] order, Performed in [place] hospital lab) Nasopharyngeal Nasopharyngeal Swab     Status: None   Collection Time: 07/21/19 11:48 AM   Specimen: Nasopharyngeal Swab  Result Value Ref Range Status   SARS Coronavirus 2 NEGATIVE NEGATIVE Final    Comment: (NOTE) If result is NEGATIVE SARS-CoV-2 target nucleic acids are NOT DETECTED. The SARS-CoV-2 RNA is generally detectable in upper and lower  respiratory specimens during the acute phase of infection. The lowest  concentration of SARS-CoV-2 viral copies this assay can detect is 250  copies / mL. A negative result does not preclude SARS-CoV-2 infection  and should not be used as the sole basis for treatment or other  patient management decisions.  A negative result may occur with  improper specimen collection / handling, submission of specimen other  than nasopharyngeal swab, presence of viral mutation(s) within the  areas targeted by this assay, and inadequate number of viral copies  (<250 copies / mL). A negative  result must be combined with clinical  observations, patient history, and epidemiological information. If result is POSITIVE SARS-CoV-2 target nucleic acids are DETECTED. The SARS-CoV-2 RNA is generally detectable in upper and lower  respiratory specimens dur ing the acute phase of infection.  Positive  results are indicative of active infection with SARS-CoV-2.  Clinical  correlation with patient history and other diagnostic information is  necessary to determine patient infection status.  Positive results do  not rule out bacterial infection or co-infection with other viruses. If result is PRESUMPTIVE POSTIVE SARS-CoV-2 nucleic acids MAY BE PRESENT.   A presumptive positive result was obtained on the submitted specimen  and confirmed on repeat testing.  While 2019 novel  coronavirus  (SARS-CoV-2) nucleic acids may be present in the submitted sample  additional confirmatory testing may be necessary for epidemiological  and / or clinical management purposes  to differentiate between  SARS-CoV-2 and other Sarbecovirus currently known to infect humans.  If clinically indicated additional testing with an alternate test  methodology [phone]) is advised. The SARS-CoV-2 RNA is generally  detectable in upper and lower respiratory sp ecimens during the acute  phase of infection. The expected result is Negative. Fact Sheet for Patients:  StrictlyIdeas.no Fact Sheet for Healthcare Providers: BankingDealers.co.za This test is not yet approved or cleared by the [place] FDA and has been authorized for detection and/or diagnosis of SARS-CoV-2 by FDA under an Emergency Use Authorization (EUA).  This EUA will remain in effect (meaning this test can be used) for the duration of the COVID-19 declaration under Section 564(b)(1) of the Act, 21 U.S.C. section 360bbb-3(b)(1), unless the authorization is terminated or revoked sooner. Performed at [facility], [address] [place], [place] [address]   C difficile quick scan w PCR reflex     Status: Abnormal   Collection Time: 07/22/19  1:50 PM   Specimen: STOOL  Result Value Ref Range Status   C Diff antigen POSITIVE (A) NEGATIVE Final   C Diff toxin NEGATIVE NEGATIVE Final   C Diff interpretation Results are indeterminate. See PCR results.  Final    Comment: Performed at [facility], [address] [place], [place] [address]  C. Diff by PCR, Reflexed     Status: Abnormal   Collection Time: 07/22/19  1:50 PM  Result Value Ref Range Status   Toxigenic C. Difficile by PCR POSITIVE (A) NEGATIVE Final    Comment: Positive for toxigenic C. difficile with little to no toxin production. Only treat if clinical presentation suggests symptomatic illness. Performed at [facility], [place] [address] [place], [place] [address]     Studies: No results found. Scheduled Meds: . amLODipine  5 mg Oral Daily  . atorvastatin  40 mg Oral q1800  . carbidopa-levodopa  1 tablet Oral BID  . carvedilol  12.5 mg Oral BID WC  . donepezil  10 mg Oral QHS  . DULoxetine  60 mg Oral QHS  . folic acid  1 mg Oral QHS  . isosorbide dinitrate  20 mg Oral TID  . levothyroxine  100 mcg Oral Q0600  . multivitamin with minerals  1 tablet Oral q morning - 10a  . pantoprazole (PROTONIX) IV  40 mg Intravenous Q12H  . rasagiline  1 mg Oral Daily  . saccharomyces boulardii  250 mg Oral BID  . vancomycin  125 mg Oral QID   Continuous Infusions: . 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  1. Check the care team in [facility] and look for a) attending/consulting TRH provider listed and b) the [facility] team  listed 2. Log into [email or web address] and use [place]'s universal password to access. If you do not have the password, please contact the hospital operator. 3. Locate the [facility] provider you are looking for under [facility] and page to a number that you can be directly reached. 4. If you still have difficulty reaching the provider, please page the [facility] (Director on Call) for the Hospitalists listed on amion for assistance.

## 2019-07-24 NOTE — TOC Initial Note (Addendum)
Transition of Care [facility]) - Initial/Assessment Note    Patient Details  Name: [name] MRN: [id number] Date of Birth: 1941/04/30  Transition of Care [facility]) CM/SW Contact:    Boneta Lucks, RN Phone Number: 07/24/2019, 2:55 PM  Clinical Narrative:   Patient admitted for acute renal failure. Patient has a high readmission score. Patient lives at home with his wife, [name]. [name] states she can not care for him at home. She has a handicap son at home as well. States patient had difficulty walking last week and was incontinent of bowel and urine, she could not turn him to clean him in bed. Patient was recently at [facility] She wishes for him to go back there or [facility]. FL2 done and sent out referral.     Addendum: [name] - brother to patient called to say [name] can not handle patient at home. Due to her health and the need to care for handicap son.               Expected Discharge Plan: Skilled Nursing Facility Barriers to Discharge: Continued Medical Work up   Patient Goals and CMS Choice   CMS Medicare.gov Compare Post Acute Care list provided to:: Patient Represenative (must comment)(wife) Choice offered to / list presented to : Spouse  Expected Discharge Plan and Services Expected Discharge Plan: [place]     Post Acute Care Choice: [place]   Expected Discharge Date: 07/25/19                  Prior Living Arrangements/Services   Lives with:: Spouse Patient language and need for interpreter reviewed:: Yes        Need for Family Participation in Patient Care: Yes (Comment) Care giver support system in place?: Yes (comment)   Criminal Activity/Legal Involvement Pertinent to Current Situation/Hospitalization: No - Comment as needed  Activities of Daily Living Home Assistive Devices/Equipment: Other (Comment)(unable to assess the pt. has dementia) ADL Screening (condition at time of admission) Patient's cognitive ability  adequate to safely complete daily activities?: No Is the patient deaf or have difficulty hearing?: No Does the patient have difficulty seeing, even when wearing glasses/contacts?: No Does the patient have difficulty concentrating, remembering, or making decisions?: Yes Patient able to express need for assistance with ADLs?: Yes Does the patient have difficulty dressing or bathing?: Yes Independently performs ADLs?: No Communication: Independent Dressing (OT): Needs assistance Is this a change from baseline?: Pre-admission baseline Grooming: Needs assistance Is this a change from baseline?: Pre-admission baseline Feeding: Needs assistance Is this a change from baseline?: Pre-admission baseline Bathing: Needs assistance Is this a change from baseline?: Pre-admission baseline Toileting: Needs assistance Is this a change from baseline?: Pre-admission baseline In/Out Bed: Needs assistance Is this a change from baseline?: Pre-admission baseline Walks in Home: Dependent Is this a change from baseline?: Pre-admission baseline Does the patient have difficulty walking or climbing stairs?: Yes Weakness of Legs: Both Weakness of Arms/Hands: Both  Permission Sought/Granted Permission sought to Long information with : Case Manager, Biomedical scientist granted to Long info w Relationship: [name] -wife  Permission granted to Long info w Contact Information: SNF         Orientation: : Oriented to Self, Oriented to Place Alcohol / Substance Use: Not Applicable Psych Involvement: No (comment)  Admission diagnosis:  Heme positive stool [R19.5] AKI (acute kidney injury) ([place]) [N17.9] Nausea vomiting and diarrhea [R11.2, R19.7] Patient Active  Problem List   Diagnosis Date Noted  . ARF (acute renal failure) ([place]) 07/21/2019  . Nausea vomiting and diarrhea 07/21/2019  . Dehydration 07/21/2019  . Upper GI bleed 07/21/2019  . Cardiomyopathy EF 30-35%   .  Parkinson's disease ([place])   . Hypothyroidism   . Elevated BUN   . Lobar pneumonia ([place]) 02/10/2019  . Acute on chronic respiratory failure with hypoxia ([place]) 02/10/2019  . NSTEMI (non-ST elevated myocardial infarction) ([place]) 02/10/2019  . Acute on chronic combined systolic and diastolic CHF (congestive heart failure) ([place]) 02/10/2019  . Non-STEMI (non-ST elevated myocardial infarction) ([place]) 02/10/2019  . Chest pain 02/09/2019  . Acute on chronic systolic CHF (congestive heart failure) ([place]) 02/09/2019  . Acute on chronic combined systolic and diastolic HF (heart failure) ([place])   . Dementia due to Parkinson's disease with behavioral disturbance ([place])   . Hyperlipidemia   . Essential hypertension   . CKD (chronic kidney disease) stage 3, GFR 30-59 ml/min (HCC)   . Elevated troponin   . Hypokalemia   . SOB (shortness of breath) 06/08/2018  . Symptomatic stenosis of right carotid artery 10/07/2016  . Preoperative cardiovascular examination 09/02/2016  . Abnormal EKG 09/02/2016  . Murmur 09/02/2016  . Carotid artery stenosis with cerebral infarction ([place]) 09/02/2016   PCP:  Celene Squibb, MD Pharmacy:   [place], [place] [address] Phone: [phone] Fax: [phone]     Social Determinants of Health (SDOH) Interventions    Readmission Risk Interventions Readmission Risk Prevention Plan 07/24/2019  Transportation Screening Complete  Medication Review ([place]) Complete  HRI or [place] Not Complete  SW Recovery Care/Counseling Consult Complete  Palliative Care Screening Not Complete  Skilled Nursing Facility Complete  Some recent data might be hidden

## 2019-07-24 NOTE — Care Management Important Message (Signed)
Important Message  Patient Details  Name: [name] MRN: [id number] Date of Birth: [birth year]   Medicare Important Message Given:  Yes     Tommy Medal 07/24/2019, 2:32 PM

## 2019-07-24 NOTE — Progress Notes (Signed)
  Subjective:  Patient has no complaints.  States his appetite is good he does not have nausea or vomiting and he also denies abdominal pain.  He states he has not had a bowel movement today.  Objective: Blood pressure 130/69, pulse 67, temperature 97.8 F (36.6 C), temperature source Oral, resp. rate 18, height '5\' 8"'$  (1.727 m), weight 80.3 kg, SpO2 100 %. Patient is alert and responds appropriately to questions. Abdomen is full but soft and nontender with organomegaly or masses.  Labs/studies Results:  CBC Latest Ref Rng & Units 07/24/2019 07/23/2019 07/22/2019  WBC 4.0 - 10.5 K/uL 4.6 6.7 8.4  Hemoglobin 13.0 - 17.0 g/dL 9.9(L) 10.2(L) 11.8(L)  Hematocrit 39.0 - 52.0 % 29.6(L) 30.1(L) 35.8(L)  Platelets 150 - 400 K/uL 173 176 195    CMP Latest Ref Rng & Units 07/24/2019 07/23/2019 07/22/2019  Glucose 70 - 99 mg/dL 86 90 103(H)  BUN 8 - 23 mg/dL 43(H) 60(H) 65(H)  Creatinine 0.61 - 1.24 mg/dL 1.45(H) 1.75(H) 1.91(H)  Sodium 135 - 145 mmol/L 135 133(L) 135  Potassium 3.5 - 5.1 mmol/L 3.8 3.8 4.1  Chloride 98 - 111 mmol/L 105 103 102  CO2 22 - 32 mmol/L 21(L) 20(L) 21(L)  Calcium 8.9 - 10.3 mg/dL 8.8(L) 8.9 9.5  Total Protein 6.5 - 8.1 g/dL 6.2(L) 6.4(L) 7.4  Total Bilirubin 0.3 - 1.2 mg/dL 0.7 0.6 0.7  Alkaline Phos 38 - 126 U/L 50 54 63  AST 15 - 41 U/L '19 16 19  '$ ALT 0 - 44 U/L '6 7 7    '$ Hepatic Function Latest Ref Rng & Units 07/24/2019 07/23/2019 07/22/2019  Total Protein 6.5 - 8.1 g/dL 6.2(L) 6.4(L) 7.4  Albumin 3.5 - 5.0 g/dL 2.9(L) 3.1(L) 3.5  AST 15 - 41 U/L '19 16 19  '$ ALT 0 - 44 U/L '6 7 7  '$ Alk Phosphatase 38 - 126 U/L 50 54 63  Total Bilirubin 0.3 - 1.2 mg/dL 0.7 0.6 0.7  Bilirubin, Direct 0.0 - 0.2 mg/dL - - -      Assessment:  #1.  C. difficile colitis secondary to recent antibiotic use for skin infection.  Day 2 on p.o. vancomycin.  Significant symptomatic and objective improvement.  He does not have abdominal tenderness anymore.  #2.  Anemia.  Drop in H&H would appear to be  secondary to hydration and some loss secondary to colitis.  He is not actively bleeding.  No indication for further testing.  #3.  Chronic kidney disease.  Renal function is improving and may be close to the baseline.  #4.  Dementia and Parkinson's disease.  Patient to be transferred to nursing care facility.   Recommendations:  Continue vancomycin for total of 10 days. Continue Florastor for 1 month. Consider extending vancomycin therapy to another week or so with he still has diarrhea on day 10. Will sign off.

## 2019-07-24 NOTE — Plan of Care (Signed)
  Problem: Acute Rehab PT Goals(only PT should resolve) Goal: Pt Will Go Supine/Side To Sit Outcome: Progressing Flowsheets (Taken 07/24/2019 1612) Pt will go Supine/Side to Sit: with min guard assist Goal: Patient Will Transfer Sit To/From Stand Outcome: Progressing Flowsheets (Taken 07/24/2019 1612) Patient will transfer sit to/from stand: with minimal assist Goal: Pt Will Transfer Bed To Chair/Chair To Bed Outcome: Progressing Flowsheets (Taken 07/24/2019 1612) Pt will Transfer Bed to Chair/Chair to Bed: with min assist Goal: Pt Will Ambulate Outcome: Progressing Flowsheets (Taken 07/24/2019 1612) Pt will Ambulate:  25 feet  with minimal assist  with moderate assist  with rolling walker   4:13 PM, 07/24/19 Lonell Grandchild, MPT Physical Therapist with [facility] [phone] office [phone] mobile phone

## 2019-07-24 NOTE — NC FL2 (Signed)
[place] LEVEL OF CARE SCREENING TOOL     IDENTIFICATION  Patient Name: [name] Birthdate: 01-11-1941 Sex: male Admission Date (Current Location): 07/21/2019  [facility] and [place] Number:  [employer] and Address:  [place] [address] [place]      Provider Number: [id number]  Attending Physician Name and Address:  Ernest Iba, MD  Relative Name and Phone Number:  [name] [facility]), Brother, [name], Spouse, [phone]    Current Level of Care: Hospital Recommended Level of Care: [name] Prior Approval Number:    Date Approved/Denied:   PASRR Number: [id number]  Discharge Plan: SNF    Current Diagnoses: Patient Active Problem List   Diagnosis Date Noted  . ARF (acute renal failure) ([name]) 07/21/2019  . Nausea vomiting and diarrhea 07/21/2019  . Dehydration 07/21/2019  . Upper GI bleed 07/21/2019  . Cardiomyopathy EF 30-35%   . Parkinson's disease ([name])   . Hypothyroidism   . Elevated BUN   . Lobar pneumonia ([name]) 02/10/2019  . Acute on chronic respiratory failure with hypoxia ([place]) 02/10/2019  . NSTEMI (non-ST elevated myocardial infarction) ([place]) 02/10/2019  . Acute on chronic combined systolic and diastolic CHF (congestive heart failure) ([place]) 02/10/2019  . Non-STEMI (non-ST elevated myocardial infarction) ([place]) 02/10/2019  . Chest pain 02/09/2019  . Acute on chronic systolic CHF (congestive heart failure) ([place]) 02/09/2019  . Acute on chronic combined systolic and diastolic HF (heart failure) ([place])   . Dementia due to Parkinson's disease with behavioral disturbance ([place])   . Hyperlipidemia   . Essential hypertension   . CKD (chronic kidney disease) stage 3, GFR 30-59 ml/min (HCC)   . Elevated troponin   . Hypokalemia   . SOB (shortness of breath) 06/08/2018  . Symptomatic stenosis of right carotid artery 10/07/2016  . Preoperative cardiovascular  examination 09/02/2016  . Abnormal EKG 09/02/2016  . Murmur 09/02/2016  . Carotid artery stenosis with cerebral infarction ([place]) 09/02/2016    Orientation RESPIRATION BLADDER Height & Weight     Self, Place  Normal Incontinent Weight: 80.3 kg Height:  5\' 8"  (172.7 cm)  BEHAVIORAL SYMPTOMS/MOOD NEUROLOGICAL BOWEL NUTRITION STATUS  (none) (none) Incontinent Diet(Heart healthy)  AMBULATORY STATUS COMMUNICATION OF NEEDS Skin   Extensive Assist Verbally Normal                       Personal Care Assistance Level of Assistance  Bathing, Feeding, Dressing Bathing Assistance: Maximum assistance Feeding assistance: Independent Dressing Assistance: Maximum assistance     Functional Limitations Info  Sight, Hearing, Speech Sight Info: Adequate Hearing Info: Adequate Speech Info: Adequate    SPECIAL CARE FACTORS FREQUENCY  PT (By licensed PT)     PT Frequency: 5X/W              Contractures Contractures Info: Not present    Additional Factors Info  Code Status, Allergies Code Status Info: DNR Allergies Info: NKA           Current Medications (07/24/2019):  This is the current hospital active medication list Current Facility-Administered Medications  Medication Dose Route Frequency Provider Last Rate Last Dose  . 0.9 %  sodium chloride infusion   Intravenous Continuous Johnson, Clanford L, MD 30 mL/hr at 07/24/19 1312    . acetaminophen (TYLENOL) tablet 650 mg  650 mg Oral Q6H PRN Wynetta Emery, Clanford L, MD   650 mg at 07/23/19 0711   Or  .  acetaminophen (TYLENOL) suppository 650 mg  650 mg Rectal Q6H PRN Johnson, Clanford L, MD      . amLODipine (NORVASC) tablet 5 mg  5 mg Oral Daily Johnson, Clanford L, MD   5 mg at 07/24/19 0829  . atorvastatin (LIPITOR) tablet 40 mg  40 mg Oral q1800 Johnson, Clanford L, MD   40 mg at 07/23/19 1838  . carbidopa-levodopa (SINEMET IR) 25-100 MG per tablet immediate release 1 tablet  1 tablet Oral BID Wynetta Emery, Clanford L, MD   1  tablet at 07/24/19 0829  . carvedilol (COREG) tablet 12.5 mg  12.5 mg Oral BID WC Johnson, Clanford L, MD   12.5 mg at 07/24/19 0829  . donepezil (ARICEPT) tablet 10 mg  10 mg Oral QHS Johnson, Clanford L, MD   10 mg at 07/23/19 2145  . DULoxetine (CYMBALTA) DR capsule 60 mg  60 mg Oral QHS Johnson, Clanford L, MD   60 mg at 65/68/12 7517  . folic acid (FOLVITE) tablet 1 mg  1 mg Oral QHS Johnson, Clanford L, MD   1 mg at 07/23/19 2145  . haloperidol lactate (HALDOL) injection 1 mg  1 mg Intravenous Q6H PRN Johnson, Clanford L, MD      . isosorbide dinitrate (ISORDIL) tablet 20 mg  20 mg Oral TID Wynetta Emery, Clanford L, MD   20 mg at 07/24/19 0829  . levothyroxine (SYNTHROID) tablet 100 mcg  100 mcg Oral Q0600 Wynetta Emery, Clanford L, MD   100 mcg at 07/24/19 0615  . LORazepam (ATIVAN) injection 0.5 mg  0.5 mg Intravenous Q4H PRN Johnson, Clanford L, MD      . multivitamin with minerals tablet 1 tablet  1 tablet Oral q morning - 10a Wynetta Emery, Clanford L, MD   1 tablet at 07/24/19 0829  . nitroGLYCERIN (NITROSTAT) SL tablet 0.4 mg  0.4 mg Sublingual Q5 min PRN Johnson, Clanford L, MD      . ondansetron (ZOFRAN) tablet 4 mg  4 mg Oral Q6H PRN Johnson, Clanford L, MD       Or  . ondansetron (ZOFRAN) injection 4 mg  4 mg Intravenous Q6H PRN Johnson, Clanford L, MD      . pantoprazole (PROTONIX) injection 40 mg  40 mg Intravenous Q12H Johnson, Clanford L, MD   40 mg at 07/24/19 0830  . rasagiline (AZILECT) tablet 1 mg  1 mg Oral Daily Johnson, Clanford L, MD   1 mg at 07/24/19 1026  . saccharomyces boulardii (FLORASTOR) capsule 250 mg  250 mg Oral BID Rogene Houston, MD   250 mg at 07/24/19 0829  . vancomycin (VANCOCIN) 50 mg/mL oral solution 125 mg  125 mg Oral QID Rogene Houston, MD   125 mg at 07/24/19 1026     Discharge Medications: Please see discharge summary for a list of discharge medications.  Relevant Imaging Results:  Relevant Lab Results:   Additional Information SS [place], [place]

## 2019-07-24 NOTE — NC FL2 (Deleted)
[place] LEVEL OF CARE SCREENING TOOL     IDENTIFICATION  Patient Name: [name] Birthdate: April 08, 1941 Sex: male Admission Date (Current Location): 07/21/2019  [facility] and [place] Number:  [employer] and Address:  [place] [address] [place]      Provider Number: [id number]  Attending Physician Name and Address:  Murlean Iba, MD  Relative Name and Phone Number:  Yony Roulston [facility]), Brother, Ross, Spouse, [phone]    Current Level of Care: Hospital Recommended Level of Care: [place] Prior Approval Number:    Date Approved/Denied:   PASRR Number: [id number]  Discharge Plan: SNF    Current Diagnoses: Patient Active Problem List   Diagnosis Date Noted  . ARF (acute renal failure) ([place]) 07/21/2019  . Nausea vomiting and diarrhea 07/21/2019  . Dehydration 07/21/2019  . Upper GI bleed 07/21/2019  . Cardiomyopathy EF 30-35%   . Parkinson's disease ([place])   . Hypothyroidism   . Elevated BUN   . Lobar pneumonia ([place]) 02/10/2019  . Acute on chronic respiratory failure with hypoxia ([place]) 02/10/2019  . NSTEMI (non-ST elevated myocardial infarction) ([place]) 02/10/2019  . Acute on chronic combined systolic and diastolic CHF (congestive heart failure) ([place]) 02/10/2019  . Non-STEMI (non-ST elevated myocardial infarction) ([place]) 02/10/2019  . Chest pain 02/09/2019  . Acute on chronic systolic CHF (congestive heart failure) ([place]) 02/09/2019  . Acute on chronic combined systolic and diastolic HF (heart failure) ([place])   . Dementia due to Parkinson's disease with behavioral disturbance ([place])   . Hyperlipidemia   . Essential hypertension   . CKD (chronic kidney disease) stage 3, GFR 30-59 ml/min (HCC)   . Elevated troponin   . Hypokalemia   . SOB (shortness of breath) 06/08/2018  . Symptomatic stenosis of right carotid artery 10/07/2016  . Preoperative cardiovascular  examination 09/02/2016  . Abnormal EKG 09/02/2016  . Murmur 09/02/2016  . Carotid artery stenosis with cerebral infarction ([place]) 09/02/2016    Orientation RESPIRATION BLADDER Height & Weight     Self, Place  Normal Incontinent Weight: 80.3 kg Height:  5\' 8"  (172.7 cm)  BEHAVIORAL SYMPTOMS/MOOD NEUROLOGICAL BOWEL NUTRITION STATUS  (none) (none) Incontinent Diet(Heart healthy)  AMBULATORY STATUS COMMUNICATION OF NEEDS Skin   Extensive Assist Verbally Normal                       Personal Care Assistance Level of Assistance  Bathing, Feeding, Dressing Bathing Assistance: Maximum assistance Feeding assistance: Independent Dressing Assistance: Maximum assistance     Functional Limitations Info  Sight, Hearing, Speech Sight Info: Adequate Hearing Info: Adequate Speech Info: Adequate    SPECIAL CARE FACTORS FREQUENCY  PT (By licensed PT)     PT Frequency: 5X/W              Contractures Contractures Info: Not present    Additional Factors Info  Code Status, Allergies Code Status Info: DNR Allergies Info: NKA           Current Medications (07/24/2019):  This is the current hospital active medication list Current Facility-Administered Medications  Medication Dose Route Frequency Provider Last Rate Last Dose  . 0.9 %  sodium chloride infusion   Intravenous Continuous Johnson, Clanford L, MD 35 mL/hr at 07/23/19 1335    . acetaminophen (TYLENOL) tablet 650 mg  650 mg Oral Q6H PRN Wynetta Emery, Clanford L, MD   650 mg at 07/23/19 0711   Or  .  acetaminophen (TYLENOL) suppository 650 mg  650 mg Rectal Q6H PRN Johnson, Clanford L, MD      . amLODipine (NORVASC) tablet 5 mg  5 mg Oral Daily Johnson, Clanford L, MD   5 mg at 07/24/19 0829  . atorvastatin (LIPITOR) tablet 40 mg  40 mg Oral q1800 Johnson, Clanford L, MD   40 mg at 07/23/19 1838  . carbidopa-levodopa (SINEMET IR) 25-100 MG per tablet immediate release 1 tablet  1 tablet Oral BID Wynetta Emery, Clanford L, MD   1  tablet at 07/24/19 0829  . carvedilol (COREG) tablet 12.5 mg  12.5 mg Oral BID WC Johnson, Clanford L, MD   12.5 mg at 07/24/19 0829  . donepezil (ARICEPT) tablet 10 mg  10 mg Oral QHS Johnson, Clanford L, MD   10 mg at 07/23/19 2145  . DULoxetine (CYMBALTA) DR capsule 60 mg  60 mg Oral QHS Johnson, Clanford L, MD   60 mg at 23/30/07 6226  . folic acid (FOLVITE) tablet 1 mg  1 mg Oral QHS Johnson, Clanford L, MD   1 mg at 07/23/19 2145  . haloperidol lactate (HALDOL) injection 1 mg  1 mg Intravenous Q6H PRN Johnson, Clanford L, MD      . isosorbide dinitrate (ISORDIL) tablet 20 mg  20 mg Oral TID Wynetta Emery, Clanford L, MD   20 mg at 07/24/19 0829  . levothyroxine (SYNTHROID) tablet 100 mcg  100 mcg Oral Q0600 Wynetta Emery, Clanford L, MD   100 mcg at 07/24/19 0615  . LORazepam (ATIVAN) injection 0.5 mg  0.5 mg Intravenous Q4H PRN Johnson, Clanford L, MD      . multivitamin with minerals tablet 1 tablet  1 tablet Oral q morning - 10a Wynetta Emery, Clanford L, MD   1 tablet at 07/24/19 0829  . nitroGLYCERIN (NITROSTAT) SL tablet 0.4 mg  0.4 mg Sublingual Q5 min PRN Johnson, Clanford L, MD      . ondansetron (ZOFRAN) tablet 4 mg  4 mg Oral Q6H PRN Johnson, Clanford L, MD       Or  . ondansetron (ZOFRAN) injection 4 mg  4 mg Intravenous Q6H PRN Johnson, Clanford L, MD      . pantoprazole (PROTONIX) injection 40 mg  40 mg Intravenous Q12H Johnson, Clanford L, MD   40 mg at 07/24/19 0830  . rasagiline (AZILECT) tablet 1 mg  1 mg Oral Daily Johnson, Clanford L, MD   1 mg at 07/24/19 1026  . saccharomyces boulardii (FLORASTOR) capsule 250 mg  250 mg Oral BID Rogene Houston, MD   250 mg at 07/24/19 0829  . vancomycin (VANCOCIN) 50 mg/mL oral solution 125 mg  125 mg Oral QID Rogene Houston, MD   125 mg at 07/24/19 1026     Discharge Medications: Please see discharge summary for a list of discharge medications.  Relevant Imaging Results:  Relevant Lab Results:   Additional Information SS  [id number]  Trish Mage, LCSW

## 2019-07-24 NOTE — Evaluation (Signed)
Physical Therapy Evaluation Patient Details Name: [name] MRN: [id number] DOB: 03-Jan-1941 Today's Date: 07/24/2019   History of Present Illness  [name] is a 78 y.o. male with cardiomyopathy (EF 30-35%), Parkinson's disease and associated dementia was brought to the emergency department by [facility] with intermittent vomiting and diarrhea for 4 days.  Family reports that he is been having black stools.  He has been more agitated at times.  He has had complaints of abdominal pain and nausea.  He is not eating and drinking well.  He has lost weight over the past 3 to 4 days.  His family is having a difficult time caring for him at home with this acute illness.  There have been no known sick contacts.  He has not had a fever.  Family reports that they have noticed that he has had large black watery stools at home.  No recent antibiotic usage.    Clinical Impression  Patient slightly apprehensive due to BLE pain, demonstrates slow labored movement for sitting up at bedside, unsteady on feet, at risk for falls and limited to a few steps at bedside due to BLE weakness and fatigue.  Patient tolerated sitting up in chair after therapy.  Patient will benefit from continued physical therapy in hospital and recommended venue below to increase strength, balance, endurance for safe ADLs and gait.    Follow Up Recommendations SNF    Equipment Recommendations  None recommended by PT    Recommendations for Other Services       Precautions / Restrictions Precautions Precautions: Fall Restrictions Weight Bearing Restrictions: No      Mobility  Bed Mobility Overal bed mobility: Needs Assistance Bed Mobility: Supine to Sit     Supine to sit: Min assist     General bed mobility comments: increased time, labored movement  Transfers Overall transfer level: Needs assistance Equipment used: Rolling walker (2 wheeled) Transfers: Sit to/from [facility] Sit to Stand: Mod  assist Stand pivot transfers: Mod assist       General transfer comment: slow labored movement  Ambulation/Gait Ambulation/Gait assistance: Mod assist Gait Distance (Feet): 4 Feet Assistive device: Rolling walker (2 wheeled) Gait Pattern/deviations: Decreased step length - right;Decreased step length - left;Decreased stride length Gait velocity: decreased   General Gait Details: limited to 4-5 slow unsteady steps at bedside due to weakness, fatigue  Stairs            Wheelchair Mobility    Modified Rankin (Stroke Patients Only)       Balance Overall balance assessment: Needs assistance Sitting-balance support: Feet supported;No upper extremity supported Sitting balance-Leahy Scale: Fair     Standing balance support: During functional activity;Bilateral upper extremity supported Standing balance-Leahy Scale: Poor Standing balance comment: fair/poor using RW                             Pertinent Vitals/Pain Pain Assessment: Faces Faces Pain Scale: Hurts little more Pain Location: BLE Pain Descriptors / Indicators: Sore;Aching Pain Intervention(s): Limited activity within patient's tolerance;Monitored during session    Home Living Family/patient expects to be discharged to:: Private residence Living Arrangements: Spouse/significant other;Children Available Help at Discharge: Family;Available 24 hours/day Type of Home: House Home Access: Level entry     Home Layout: One level Home Equipment: Walker - 4 wheels Additional Comments: all information per patient    Prior Function Level of Independence: Independent with assistive device(s)  Comments: houshold ambulator with Rollator     Hand Dominance   Dominant Hand: Right    Extremity/Trunk Assessment   Upper Extremity Assessment Upper Extremity Assessment: Generalized weakness    Lower Extremity Assessment Lower Extremity Assessment: Generalized weakness    Cervical /  Trunk Assessment Cervical / Trunk Assessment: Normal  Communication   Communication: HOH  Cognition Arousal/Alertness: Awake/alert Behavior During Therapy: WFL for tasks assessed/performed;Anxious Overall Cognitive Status: History of cognitive impairments - at baseline                                        General Comments      Exercises     Assessment/Plan    PT Assessment Patient needs continued PT services  PT Problem List Decreased strength;Decreased activity tolerance;Decreased balance;Decreased mobility       PT Treatment Interventions Gait training;Functional mobility training;Therapeutic activities;Therapeutic exercise;Stair training;Patient/family education    PT Goals (Current goals can be found in the Care Plan section)  Acute Rehab PT Goals Patient Stated Goal: return home with family to assist PT Goal Formulation: With patient Time For Goal Achievement: 08/07/19 Potential to Achieve Goals: Good    Frequency Min 3X/week   Barriers to discharge        Co-evaluation               AM-PAC PT "6 Clicks" Mobility  Outcome Measure Help needed turning from your back to your side while in a flat bed without using bedrails?: A Little Help needed moving from lying on your back to sitting on the side of a flat bed without using bedrails?: A Little Help needed moving to and from a bed to a chair (including a wheelchair)?: A Lot Help needed standing up from a chair using your arms (e.g., wheelchair or bedside chair)?: A Lot Help needed to walk in hospital room?: A Lot Help needed climbing 3-5 steps with a railing? : Total 6 Click Score: 13    End of Session   Activity Tolerance: Patient tolerated treatment well;Patient limited by fatigue Patient left: in chair;with call bell/phone within reach Nurse Communication: Mobility status PT Visit Diagnosis: Unsteadiness on feet (R26.81);Other abnormalities of gait and mobility (R26.89);Muscle  weakness (generalized) (M62.81)    Time: 3614-4315 PT Time Calculation (min) (ACUTE ONLY): 36 min   Charges:   PT Evaluation $PT Eval Moderate Complexity: 1 Mod PT Treatments $Therapeutic Activity: 23-37 mins        4:11 PM, 07/24/19 Lonell Grandchild, MPT Physical Therapist with [facility] 336 [phone] office [phone] mobile phone

## 2019-07-24 NOTE — NC FL2 (Deleted)
[place] LEVEL OF CARE SCREENING TOOL     IDENTIFICATION  Patient Name: [name] Birthdate: 1941/07/02 Sex: male Admission Date (Current Location): 07/21/2019  [facility] and [place] Number:  [employer] and Address:  [place] [address], [place]      Provider Number: [id number]  Attending Physician Name and Address:  Murlean Iba, MD  Relative Name and Phone Number:  Genie Mirabal    [phone]    Current Level of Care: Hospital Recommended Level of Care: Max Prior Approval Number:    Date Approved/Denied:   PASRR Number: [id number]  Discharge Plan: SNF    Current Diagnoses: Patient Active Problem List   Diagnosis Date Noted  . ARF (acute renal failure) ([place]) 07/21/2019  . Nausea vomiting and diarrhea 07/21/2019  . Dehydration 07/21/2019  . Upper GI bleed 07/21/2019  . Cardiomyopathy EF 30-35%   . Parkinson's disease ([place])   . Hypothyroidism   . Elevated BUN   . Lobar pneumonia ([place]) 02/10/2019  . Acute on chronic respiratory failure with hypoxia ([place]) 02/10/2019  . NSTEMI (non-ST elevated myocardial infarction) ([place]) 02/10/2019  . Acute on chronic combined systolic and diastolic CHF (congestive heart failure) ([place]) 02/10/2019  . Non-STEMI (non-ST elevated myocardial infarction) ([place]) 02/10/2019  . Chest pain 02/09/2019  . Acute on chronic systolic CHF (congestive heart failure) ([place]) 02/09/2019  . Acute on chronic combined systolic and diastolic HF (heart failure) ([place])   . Dementia due to Parkinson's disease with behavioral disturbance ([place])   . Hyperlipidemia   . Essential hypertension   . CKD (chronic kidney disease) stage 3, GFR 30-59 ml/min (HCC)   . Elevated troponin   . Hypokalemia   . SOB (shortness of breath) 06/08/2018  . Symptomatic stenosis of right carotid artery 10/07/2016  . Preoperative cardiovascular examination 09/02/2016  . Abnormal EKG 09/02/2016  .  Murmur 09/02/2016  . Carotid artery stenosis with cerebral infarction ([place]) 09/02/2016    Orientation RESPIRATION BLADDER Height & Weight     Self, Place  Normal Incontinent Weight: 80.3 kg Height:  5\' 8"  (172.7 cm)  BEHAVIORAL SYMPTOMS/MOOD NEUROLOGICAL BOWEL NUTRITION STATUS      Incontinent Diet(Full Liquid- thin)  AMBULATORY STATUS COMMUNICATION OF NEEDS Skin   Extensive Assist Verbally Normal                       Personal Care Assistance Level of Assistance  Bathing, Feeding, Dressing Bathing Assistance: Limited assistance Feeding assistance: Limited assistance Dressing Assistance: Limited assistance     Functional Limitations Info  Sight, Hearing, Speech Sight Info: Adequate Hearing Info: Adequate Speech Info: Adequate    SPECIAL CARE FACTORS FREQUENCY  PT (By licensed PT)     PT Frequency: PT 5 times a week              Contractures Contractures Info: Not present    Additional Factors Info  Code Status, Allergies Code Status Info: DNR Allergies Info: NKDA           Current Medications (07/24/2019):  This is the current hospital active medication list Current Facility-Administered Medications  Medication Dose Route Frequency Provider Last Rate Last Dose  . 0.9 %  sodium chloride infusion   Intravenous Continuous Johnson, Clanford L, MD 35 mL/hr at 07/23/19 1335    . acetaminophen (TYLENOL) tablet 650 mg  650 mg Oral Q6H PRN Wynetta Emery, Clanford L, MD   650 mg at 07/23/19  0711   Or  . acetaminophen (TYLENOL) suppository 650 mg  650 mg Rectal Q6H PRN Johnson, Clanford L, MD      . amLODipine (NORVASC) tablet 5 mg  5 mg Oral Daily Johnson, Clanford L, MD   5 mg at 07/24/19 0829  . atorvastatin (LIPITOR) tablet 40 mg  40 mg Oral q1800 Johnson, Clanford L, MD   40 mg at 07/23/19 1838  . carbidopa-levodopa (SINEMET IR) 25-100 MG per tablet immediate release 1 tablet  1 tablet Oral BID Wynetta Emery, Clanford L, MD   1 tablet at 07/24/19 0829  . carvedilol  (COREG) tablet 12.5 mg  12.5 mg Oral BID WC Johnson, Clanford L, MD   12.5 mg at 07/24/19 0829  . donepezil (ARICEPT) tablet 10 mg  10 mg Oral QHS Johnson, Clanford L, MD   10 mg at 07/23/19 2145  . DULoxetine (CYMBALTA) DR capsule 60 mg  60 mg Oral QHS Johnson, Clanford L, MD   60 mg at 27/06/23 7628  . folic acid (FOLVITE) tablet 1 mg  1 mg Oral QHS Johnson, Clanford L, MD   1 mg at 07/23/19 2145  . haloperidol lactate (HALDOL) injection 1 mg  1 mg Intravenous Q6H PRN Johnson, Clanford L, MD      . isosorbide dinitrate (ISORDIL) tablet 20 mg  20 mg Oral TID Wynetta Emery, Clanford L, MD   20 mg at 07/24/19 0829  . levothyroxine (SYNTHROID) tablet 100 mcg  100 mcg Oral Q0600 Wynetta Emery, Clanford L, MD   100 mcg at 07/24/19 0615  . LORazepam (ATIVAN) injection 0.5 mg  0.5 mg Intravenous Q4H PRN Johnson, Clanford L, MD      . multivitamin with minerals tablet 1 tablet  1 tablet Oral q morning - 10a Wynetta Emery, Clanford L, MD   1 tablet at 07/24/19 0829  . nitroGLYCERIN (NITROSTAT) SL tablet 0.4 mg  0.4 mg Sublingual Q5 min PRN Johnson, Clanford L, MD      . ondansetron (ZOFRAN) tablet 4 mg  4 mg Oral Q6H PRN Johnson, Clanford L, MD       Or  . ondansetron (ZOFRAN) injection 4 mg  4 mg Intravenous Q6H PRN Johnson, Clanford L, MD      . pantoprazole (PROTONIX) injection 40 mg  40 mg Intravenous Q12H Johnson, Clanford L, MD   40 mg at 07/24/19 0830  . rasagiline (AZILECT) tablet 1 mg  1 mg Oral Daily Johnson, Clanford L, MD   1 mg at 07/24/19 1026  . saccharomyces boulardii (FLORASTOR) capsule 250 mg  250 mg Oral BID Rogene Houston, MD   250 mg at 07/24/19 0829  . vancomycin (VANCOCIN) 50 mg/mL oral solution 125 mg  125 mg Oral QID Rogene Houston, MD   125 mg at 07/24/19 1026     Discharge Medications: Please see discharge summary for a list of discharge medications.  Relevant Imaging Results:  Relevant Lab Results:   Additional Information SS [id number]  Boneta Lucks, RN

## 2019-07-25 MED ORDER — VANCOMYCIN 50 MG/ML ORAL SOLUTION: 125.0000 mg | Freq: Four times a day (QID) | ORAL | 0 refills | Status: DC

## 2019-07-25 MED ORDER — ACETAMINOPHEN 325 MG PO TABS: 650.0000 mg | ORAL_TABLET | Freq: Four times a day (QID) | ORAL | Status: AC | PRN

## 2019-07-25 MED ORDER — SACCHAROMYCES BOULARDII 250 MG PO CAPS: 250.0000 mg | ORAL_CAPSULE | Freq: Two times a day (BID) | ORAL | 0 refills | Status: AC

## 2019-07-25 MED ORDER — PANTOPRAZOLE SODIUM 40 MG PO TBEC
40.0000 mg | DELAYED_RELEASE_TABLET | Freq: Two times a day (BID) | ORAL | Status: DC
  Administered 2019-07-25 – 2019-07-27 (×4): 40 mg via ORAL
  Filled 2019-07-25 (×4): qty 1

## 2019-07-25 MED ORDER — FUROSEMIDE 20 MG PO TABS: 20.0000 mg | ORAL_TABLET | Freq: Every day | ORAL | 3 refills | Status: DC | PRN

## 2019-07-25 MED ORDER — FERROUS SULFATE 325 (65 FE) MG PO TABS: 325.0000 mg | ORAL_TABLET | Freq: Every day | ORAL | 0 refills | Status: AC

## 2019-07-25 NOTE — TOC Progression Note (Signed)
Transition of Care [facility]) - Progression Note    Patient Details  Name: [name] MRN: [id number] Date of Birth: September 04, 1941  [facility] CM/SW Contact  Boneta Lucks, RN Phone Number: 07/25/2019, 12:46 PM  Clinical Narrative:   [facility] called to get status of patient.  Patient has not had Diarrhea today and had treatment x 3 days.They will make a bed offer. [facility] we started INS Auth, patient will need a COVID test.  MD and RN updated. Spoke with Lousie - wife, She is happy they are taking him, she can not handle him at home with diarrhea and weakness.  She does want him back home.  States they do not qualify for Medicaid.  Patient has been in SNF a couple times before. Advise Wife this is a short term placement, to stay in contact with [facility] and speak with SW there to find out options at some point when she will not be able to care for patient at home.     Expected Discharge Plan: Skilled Nursing Facility Barriers to Discharge: Continued Medical Work up  Expected Discharge Plan and Services Expected Discharge Plan: [place] Acute Care Choice: [place]   Expected Discharge Date: 07/25/19                         Readmission Risk Interventions Readmission Risk Prevention Plan 07/24/2019  Transportation Screening Complete  Medication Review (RN Care Manager) Complete  HRI or Home Care Consult Not Complete  SW Recovery Care/Counseling Consult Complete  Palliative Care Screening Not Complete  Skilled Nursing Facility Complete  Some recent data might be hidden

## 2019-07-25 NOTE — Discharge Summary (Addendum)
Physician Discharge Summary  [name] KDX:[id number] DOB: 1941/08/13 DOA: 07/21/2019  PCP: Celene Squibb, MD Cardiologist: Branch  Admit date: 07/21/2019 Discharge date: 07/25/2019  Disposition: SNF  Discharge Condition: STABLE   CODE STATUS: DNR    Brief Hospitalization Summary: Please see all hospital notes, images, labs for full details of the hospitalization. HPI: [name] is a 78 y.o. male with cardiomyopathy (EF 30-35%), Parkinson's disease and associated dementia was brought to the emergency department by [facility] with intermittent vomiting and diarrhea for 4 days.  Family reports that he is been having black stools.  He has been more agitated at times.  He has had complaints of abdominal pain and nausea.  He is not eating and drinking well.  He has lost weight over the past 3 to 4 days.  His family is having a difficult time caring for him at home with this acute illness.  There have been no known sick contacts.  He has not had a fever.  Family reports that they have noticed that he has had large black watery stools at home.  No recent antibiotic usage.  ED course: The patient was noted to be Hemoccult positive.  He had multiple watery black stools in the ED.  Patient was afebrile with a heart rate of 77 and blood pressure of 145/68.  Pulse ox 97% on room air.  Glucose 102.  BNP 258.0.  SARS coronavirus test negative.  Blood cultures obtained.  CT of the abdomen and pelvis with no acute findings to explain symptoms.  See full report below.  Sodium 132.  Creatinine 2.25, BUN 73.  Hemoglobin 12.6 platelet count 191.  Lactic acid 0.9.  Chest x-ray with no acute findings.  EKG normal sinus rhythm.  Brief Admission Hx: 78 y.o.malewith cardiomyopathy (EF 30-35%), Parkinson's disease and associated dementia was brought to the emergency department by [facility] with intermittent vomiting and diarrhea for 4 days with black stools.   MDM/Assessment & Plan:   1. Acute renal failure-Treated  with IV fluid hydration. Improved now.  I suspect prerenal azotemia secondary to dehydration likely from ongoing diarrhea. Creatinine down to 1.45.  2. C diff colitis - C diff testing positive and with history of recent antibiotic use GI started him on vancomycin and florastor.  He is to complete 10 full days of oral vancomycin.  He is already feeling better.  3. Dehydration - treated with IV fluid hydration. 4. Chronic blood loss anemia - Pt is taking daily iron supplementation. Hg stable.  5. Elevated BUN - improved with hydration.  6. Parkinson's disease with dementia-resumed home medications and follow. His family cannot care for him at home anymore and he will need placement. I have asked the social worker to see him to arrange placement. PT evaluation recommended SNF which is being arranged. 7. Coronary artery disease-resume home cardiac medications. 8. Cardiomyopathy-resume home lasix. He takes one tablet daily only if he has a weight gain of 3 pounds or more.   DVT Prophylaxis:SCD Code Status:DNR Family Communication:wife updated by telephone Disposition Plan: SNF when bed available  Discharge Diagnoses:  Active Problems:   Murmur   Dementia due to Parkinson's disease with behavioral disturbance (HCC)   Hyperlipidemia   Essential hypertension   ARF (acute renal failure) (HCC)   Nausea vomiting and diarrhea   Dehydration   Upper GI bleed   Cardiomyopathy EF 30-35%   Parkinson's disease ([place])   Hypothyroidism   Elevated BUN   Discharge Instructions: Discharge Instructions  Increase activity slowly   Complete by: As directed      Allergies as of 07/25/2019   No Known Allergies     Medication List    STOP taking these medications   acetaminophen 650 MG CR tablet Commonly known as: TYLENOL Replaced by: acetaminophen 325 MG tablet   docusate sodium 100 MG capsule Commonly known as: COLACE   potassium chloride SA 20 MEQ tablet Commonly known as: K-DUR      TAKE these medications   acetaminophen 325 MG tablet Commonly known as: TYLENOL Take 2 tablets (650 mg total) by mouth every 6 (six) hours as needed for mild pain (or Fever >/= 101). Replaces: acetaminophen 650 MG CR tablet   amLODipine 5 MG tablet Commonly known as: NORVASC Take 1 tablet (5 mg total) by mouth daily.   aspirin EC 81 MG tablet Take 81 mg by mouth daily.   atorvastatin 40 MG tablet Commonly known as: LIPITOR Take 1 tablet (40 mg total) by mouth daily at 6 PM.   carbidopa-levodopa 25-100 MG tablet Commonly known as: SINEMET IR Take 1 tablet by mouth 2 (two) times daily.   carvedilol 12.5 MG tablet Commonly known as: COREG Take 1 tablet (12.5 mg total) by mouth 2 (two) times daily with a meal.   donepezil 10 MG tablet Commonly known as: ARICEPT Take 10 mg by mouth at bedtime.   DULoxetine 60 MG capsule Commonly known as: CYMBALTA Take 60 mg by mouth at bedtime.   famotidine 20 MG tablet Commonly known as: PEPCID Take 20 mg by mouth 2 (two) times daily.   ferrous sulfate 325 (65 FE) MG tablet Take 1 tablet (325 mg total) by mouth daily with breakfast. What changed: when to take this   folic acid 1 MG tablet Commonly known as: FOLVITE Take 1 mg by mouth at bedtime.   furosemide 20 MG tablet Commonly known as: Lasix Take 1 tablet (20 mg total) by mouth daily as needed (for weight gain greater than 3 pounds overnight or lower extremity edema). Start taking on: July 27, 2019 What changed: These instructions start on July 27, 2019. If you are unsure what to do until then, ask your doctor or other care provider.   hydrOXYzine 25 MG tablet Commonly known as: ATARAX/VISTARIL Take 25 mg by mouth 3 (three) times daily.   isosorbide dinitrate 20 MG tablet Commonly known as: ISORDIL Take 20 mg by mouth 3 (three) times daily.   levothyroxine 100 MCG tablet Commonly known as: SYNTHROID   multivitamin with minerals tablet Take 1 tablet by mouth  every morning.   nitroGLYCERIN 0.4 MG SL tablet Commonly known as: NITROSTAT Place 1 tablet (0.4 mg total) under the tongue every 5 (five) minutes as needed for chest pain.   oxybutynin 5 MG tablet Commonly known as: DITROPAN Take 5 mg by mouth 2 (two) times daily.   rasagiline 1 MG Tabs tablet Commonly known as: AZILECT Take 1 tablet by mouth daily.   saccharomyces boulardii 250 MG capsule Commonly known as: FLORASTOR Take 1 capsule (250 mg total) by mouth 2 (two) times daily.   vancomycin 50 mg/mL  oral solution Commonly known as: VANCOCIN Take 2.5 mLs (125 mg total) by mouth 4 (four) times daily for 8 days.      Follow-up Information    Celene Squibb, MD. Schedule an appointment as soon as possible for a visit in 2 week(s).   Specialty: Internal Medicine Contact information: [place], Jonathan F, [place]. Schedule an appointment as soon as possible for a visit in 1 month(s).   Specialty: Cardiology Contact information: [address] [place] [address] [phone]          No Known Allergies Allergies as of 07/25/2019   No Known Allergies     Medication List    STOP taking these medications   acetaminophen 650 MG CR tablet Commonly known as: TYLENOL Replaced by: acetaminophen 325 MG tablet   docusate sodium 100 MG capsule Commonly known as: COLACE   potassium chloride SA 20 MEQ tablet Commonly known as: K-DUR     TAKE these medications   acetaminophen 325 MG tablet Commonly known as: TYLENOL Take 2 tablets (650 mg total) by mouth every 6 (six) hours as needed for mild pain (or Fever >/= 101). Replaces: acetaminophen 650 MG CR tablet   amLODipine 5 MG tablet Commonly known as: NORVASC Take 1 tablet (5 mg total) by mouth daily.   aspirin EC 81 MG tablet Take 81 mg by mouth daily.   atorvastatin 40 MG tablet Commonly known as: LIPITOR Take 1 tablet (40 mg total) by mouth daily at  6 PM.   carbidopa-levodopa 25-100 MG tablet Commonly known as: SINEMET IR Take 1 tablet by mouth 2 (two) times daily.   carvedilol 12.5 MG tablet Commonly known as: COREG Take 1 tablet (12.5 mg total) by mouth 2 (two) times daily with a meal.   donepezil 10 MG tablet Commonly known as: ARICEPT Take 10 mg by mouth at bedtime.   DULoxetine 60 MG capsule Commonly known as: CYMBALTA Take 60 mg by mouth at bedtime.   famotidine 20 MG tablet Commonly known as: PEPCID Take 20 mg by mouth 2 (two) times daily.   ferrous sulfate 325 (65 FE) MG tablet Take 1 tablet (325 mg total) by mouth daily with breakfast. What changed: when to take this   folic acid 1 MG tablet Commonly known as: FOLVITE Take 1 mg by mouth at bedtime.   furosemide 20 MG tablet Commonly known as: Lasix Take 1 tablet (20 mg total) by mouth daily as needed (for weight gain greater than 3 pounds overnight or lower extremity edema). Start taking on: July 27, 2019 What changed: These instructions start on July 27, 2019. If you are unsure what to do until then, ask your doctor or other care provider.   hydrOXYzine 25 MG tablet Commonly known as: ATARAX/VISTARIL Take 25 mg by mouth 3 (three) times daily.   isosorbide dinitrate 20 MG tablet Commonly known as: ISORDIL Take 20 mg by mouth 3 (three) times daily.   levothyroxine 100 MCG tablet Commonly known as: SYNTHROID   multivitamin with minerals tablet Take 1 tablet by mouth every morning.   nitroGLYCERIN 0.4 MG SL tablet Commonly known as: NITROSTAT Place 1 tablet (0.4 mg total) under the tongue every 5 (five) minutes as needed for chest pain.   oxybutynin 5 MG tablet Commonly known as: DITROPAN Take 5 mg by mouth 2 (two) times daily.   rasagiline 1 MG Tabs tablet Commonly known as: AZILECT Take 1 tablet by mouth daily.   saccharomyces boulardii 250 MG capsule Commonly known as: FLORASTOR Take 1 capsule (250 mg total) by mouth 2 (two) times  daily.   vancomycin 50 mg/mL  oral solution Commonly known as: VANCOCIN Take 2.5 mLs (125 mg total) by mouth 4 (four) times daily for 8 days.  Procedures/Studies: Ct Abdomen Pelvis Wo Contrast  Result Date: 07/21/2019 CLINICAL DATA:  Intermittent vomiting and diarrhea for 4 days. EXAM: CT ABDOMEN AND PELVIS WITHOUT CONTRAST TECHNIQUE: Multidetector CT imaging of the abdomen and pelvis was performed following the standard protocol without IV contrast. COMPARISON:  CT angiogram of the chest, abdomen and pelvis 02/10/2019. FINDINGS: Lower chest: Punctate calcified granulomata in the lower lobes bilaterally noted. Mild linear atelectasis or scar left lung base is unchanged. No pleural or pericardial effusion. Heart size is normal. Hepatobiliary: No focal liver abnormality is seen. No gallstones, gallbladder wall thickening, or biliary dilatation. Pancreas: Unremarkable. No pancreatic ductal dilatation or surrounding inflammatory changes. Spleen: Normal in size. Punctate calcified granulomata within the spleen noted. Adrenals/Urinary Tract: Adrenal glands are unremarkable. Kidneys are normal, without renal calculi, focal lesion, or hydronephrosis. Bladder is unremarkable. Stomach/Bowel: Stomach is within normal limits. Status post appendectomy. No evidence of bowel wall thickening, distention, or inflammatory changes. Vascular/Lymphatic: Aortic atherosclerosis. No enlarged abdominal or pelvic lymph nodes. Reproductive: Status post prostatectomy. Other: None. Musculoskeletal: Avascular necrosis of the femoral heads bilaterally without fragmentation or collapse. No acute bony abnormality. IMPRESSION: No acute abnormality or finding to explain the patient's symptoms. Atherosclerosis. Avascular necrosis of the femoral heads bilaterally without fragmentation or collapse. Electronically Signed   By: Inge Rise M.D.   On: 07/21/2019 13:00   Dg Chest Port 1 View  Result Date: 07/21/2019 CLINICAL DATA:   Intermittent vomiting and diarrhea for 4 days. EXAM: PORTABLE CHEST 1 VIEW COMPARISON:  CT chest, abdomen and pelvis 02/10/2019. Single-view of the chest 06/08/2018 and 02/09/2019. FINDINGS: There is cardiomegaly without edema. Calcified granulomata are seen bilaterally. No consolidative process, pneumothorax or effusion. IMPRESSION: No acute disease. Cardiomegaly. Atherosclerosis. Electronically Signed   By: Inge Rise M.D.   On: 07/21/2019 11:03     Subjective: Patient says he is feeling much better.  His abdominal pain is resolved and his diarrhea has resolved.  Discharge Exam: Vitals:   07/25/19 0557 07/25/19 0756  BP: 136/69 (!) 144/71  Pulse: 66   Resp: 16   Temp: (!) 97.4 F (36.3 C)   SpO2: 97%    Vitals:   07/24/19 2102 07/25/19 0500 07/25/19 0557 07/25/19 0756  BP: 131/68  136/69 (!) 144/71  Pulse: 71  66   Resp: 16  16   Temp: 97.9 F (36.6 C)  (!) 97.4 F (36.3 C)   TempSrc: Oral  Oral   SpO2: 97%  97%   Weight:  83.4 kg    Height:       General: Pt is alert, awake, not in acute distress Cardiovascular: RRR, S1/S2 +, no rubs, no gallops Respiratory: CTA bilaterally, no wheezing, no rhonchi Abdominal: Soft, NT, ND, bowel sounds + Extremities: no edema, no cyanosis   The results of significant diagnostics from this hospitalization (including imaging, microbiology, ancillary and laboratory) are listed below for reference.     Microbiology: Recent Results (from the past 240 hour(s))  Culture, blood (Routine X 2) w Reflex to ID Panel     Status: None (Preliminary result)   Collection Time: 07/21/19 11:11 AM   Specimen: Right Antecubital; Blood  Result Value Ref Range Status   Specimen Description   Final    RIGHT ANTECUBITAL BOTTLES DRAWN AEROBIC AND ANAEROBIC   Special Requests Blood Culture adequate volume  Final   Culture   Final    NO GROWTH 4 DAYS Performed at [facility], [address] [place], [place] [address]  Report Status PENDING   Incomplete  Culture, blood (Routine X 2) w Reflex to ID Panel     Status: None (Preliminary result)   Collection Time: 07/21/19 11:38 AM   Specimen: BLOOD RIGHT ARM  Result Value Ref Range Status   Specimen Description BLOOD RIGHT ARM  Final   Special Requests   Final    BOTTLES DRAWN AEROBIC AND ANAEROBIC Blood Culture adequate volume   Culture   Final    NO GROWTH 4 DAYS Performed at [facility], [address] [place], [place] [address]    Report Status PENDING  Incomplete  SARS Coronavirus 2 [facility] order, Performed in [place] hospital lab) Nasopharyngeal Nasopharyngeal Swab     Status: None   Collection Time: 07/21/19 11:48 AM   Specimen: Nasopharyngeal Swab  Result Value Ref Range Status   SARS Coronavirus 2 NEGATIVE NEGATIVE Final    Comment: (NOTE) If result is NEGATIVE SARS-CoV-2 target nucleic acids are NOT DETECTED. The SARS-CoV-2 RNA is generally detectable in upper and lower  respiratory specimens during the acute phase of infection. The lowest  concentration of SARS-CoV-2 viral copies this assay can detect is 250  copies / mL. A negative result does not preclude SARS-CoV-2 infection  and should not be used as the sole basis for treatment or other  patient management decisions.  A negative result may occur with  improper specimen collection / handling, submission of specimen other  than nasopharyngeal swab, presence of viral mutation(s) within the  areas targeted by this assay, and inadequate number of viral copies  (<250 copies / mL). A negative result must be combined with clinical  observations, patient history, and epidemiological information. If result is POSITIVE SARS-CoV-2 target nucleic acids are DETECTED. The SARS-CoV-2 RNA is generally detectable in upper and lower  respiratory specimens dur ing the acute phase of infection.  Positive  results are indicative of active infection with SARS-CoV-2.  Clinical  correlation with patient history and other  diagnostic information is  necessary to determine patient infection status.  Positive results do  not rule out bacterial infection or co-infection with other viruses. If result is PRESUMPTIVE POSTIVE SARS-CoV-2 nucleic acids MAY BE PRESENT.   A presumptive positive result was obtained on the submitted specimen  and confirmed on repeat testing.  While 2019 novel coronavirus  (SARS-CoV-2) nucleic acids may be present in the submitted sample  additional confirmatory testing may be necessary for epidemiological  and / or clinical management purposes  to differentiate between  SARS-CoV-2 and other Sarbecovirus currently known to infect humans.  If clinically indicated additional testing with an alternate test  methodology [phone]) is advised. The SARS-CoV-2 RNA is generally  detectable in upper and lower respiratory sp ecimens during the acute  phase of infection. The expected result is Negative. Fact Sheet for Patients:  StrictlyIdeas.no Fact Sheet for Healthcare Providers: BankingDealers.co.za This test is not yet approved or cleared by the [place] FDA and has been authorized for detection and/or diagnosis of SARS-CoV-2 by FDA under an Emergency Use Authorization (EUA).  This EUA will remain in effect (meaning this test can be used) for the duration of the COVID-19 declaration under Section 564(b)(1) of the Act, 21 U.S.C. section 360bbb-3(b)(1), unless the authorization is terminated or revoked sooner. Performed at [facility], [address], [place], [place] [address]   C difficile quick scan w PCR reflex     Status: Abnormal   Collection Time: 07/22/19  1:50 PM   Specimen: STOOL  Result Value Ref Range Status   C Diff antigen POSITIVE (A) NEGATIVE Final   C Diff toxin NEGATIVE NEGATIVE Final   C Diff interpretation Results are indeterminate. See PCR results.  Final    Comment: Performed at [facility], [address],  [place], [place] [address]  C. Diff by PCR, Reflexed     Status: Abnormal   Collection Time: 07/22/19  1:50 PM  Result Value Ref Range Status   Toxigenic C. Difficile by PCR POSITIVE (A) NEGATIVE Final    Comment: Positive for toxigenic C. difficile with little to no toxin production. Only treat if clinical presentation suggests symptomatic illness. Performed at [facility], [place] [address] [place], [place] [address]      Labs: BNP (last 3 results) Recent Labs    02/09/19 2100 07/21/19 1112  BNP 1,647.0* 048.8*   Basic Metabolic Panel: Recent Labs  Lab 07/21/19 1109 07/22/19 0654 07/23/19 0649 07/24/19 0603  NA 132* 135 133* 135  K 4.3 4.1 3.8 3.8  CL 97* 102 103 105  CO2 23 21* 20* 21*  GLUCOSE 102* 103* 90 86  BUN 73* 65* 60* 43*  CREATININE 2.25* 1.91* 1.75* 1.45*  CALCIUM 9.7 9.5 8.9 8.8*  MG  --  2.2 2.1 2.0  PHOS  --  4.7* 3.5 3.3   Liver Function Tests: Recent Labs  Lab 07/21/19 1109 07/22/19 0654 07/23/19 0649 07/24/19 0603  AST 20 19 16 19   ALT 8 7 7 6   ALKPHOS 67 63 54 50  BILITOT 0.6 0.7 0.6 0.7  PROT 8.0 7.4 6.4* 6.2*  ALBUMIN 4.0 3.5 3.1* 2.9*   Recent Labs  Lab 07/21/19 1109  LIPASE 37   No results for input(s): AMMONIA in the last 168 hours. CBC: Recent Labs  Lab 07/21/19 1109 07/22/19 0654 07/23/19 0649 07/24/19 0603  WBC 8.1 8.4 6.7 4.6  NEUTROABS 5.7  --   --   --   HGB 12.6* 11.8* 10.2* 9.9*  HCT 37.4* 35.8* 30.1* 29.6*  MCV 93.0 94.2 92.6 94.0  PLT 191 195 176 173   Cardiac Enzymes: No results for input(s): CKTOTAL, CKMB, CKMBINDEX, TROPONINI in the last 168 hours. BNP: Invalid input(s): POCBNP CBG: No results for input(s): GLUCAP in the last 168 hours. D-Dimer No results for input(s): DDIMER in the last 72 hours. Hgb A1c No results for input(s): HGBA1C in the last 72 hours. Lipid Profile No results for input(s): CHOL, HDL, LDLCALC, TRIG, CHOLHDL, LDLDIRECT in the last 72 hours. Thyroid function studies No results  for input(s): TSH, T4TOTAL, T3FREE, THYROIDAB in the last 72 hours.  Invalid input(s): FREET3 Anemia work up No results for input(s): VITAMINB12, FOLATE, FERRITIN, TIBC, IRON, RETICCTPCT in the last 72 hours. Urinalysis    Component Value Date/Time   COLORURINE YELLOW 07/21/2019 1023   APPEARANCEUR CLEAR 07/21/2019 1023   LABSPEC 1.014 07/21/2019 1023   PHURINE 5.0 07/21/2019 1023   GLUCOSEU NEGATIVE 07/21/2019 1023   HGBUR NEGATIVE 07/21/2019 [place] 07/21/2019 1023   [place] 07/21/2019 1023   PROTEINUR NEGATIVE 07/21/2019 1023   NITRITE NEGATIVE 07/21/2019 1023   LEUKOCYTESUR NEGATIVE 07/21/2019 1023   Sepsis Labs Invalid input(s): PROCALCITONIN,  WBC,  LACTICIDVEN Microbiology Recent Results (from the past 240 hour(s))  Culture, blood (Routine X 2) w Reflex to ID Panel     Status: None (Preliminary result)   Collection Time: 07/21/19 11:11 AM   Specimen: Right Antecubital; Blood  Result Value Ref Range Status  Specimen Description   Final    RIGHT ANTECUBITAL BOTTLES DRAWN AEROBIC AND ANAEROBIC   Special Requests Blood Culture adequate volume  Final   Culture   Final    NO GROWTH 4 DAYS Performed at [facility], [address], [place], [place] [address]    Report Status PENDING  Incomplete  Culture, blood (Routine X 2) w Reflex to ID Panel     Status: None (Preliminary result)   Collection Time: 07/21/19 11:38 AM   Specimen: BLOOD RIGHT ARM  Result Value Ref Range Status   Specimen Description BLOOD RIGHT ARM  Final   Special Requests   Final    BOTTLES DRAWN AEROBIC AND ANAEROBIC Blood Culture adequate volume   Culture   Final    NO GROWTH 4 DAYS Performed at [facility], [address] [place],  [address]    Report Status PENDING  Incomplete  SARS Coronavirus 2 [facility] order, Performed in [place] hospital lab) Nasopharyngeal Nasopharyngeal Swab     Status: None   Collection Time: 07/21/19 11:48 AM   Specimen:  Nasopharyngeal Swab  Result Value Ref Range Status   SARS Coronavirus 2 NEGATIVE NEGATIVE Final    Comment: (NOTE) If result is NEGATIVE SARS-CoV-2 target nucleic acids are NOT DETECTED. The SARS-CoV-2 RNA is generally detectable in upper and lower  respiratory specimens during the acute phase of infection. The lowest  concentration of SARS-CoV-2 viral copies this assay can detect is 250  copies / mL. A negative result does not preclude SARS-CoV-2 infection  and should not be used as the sole basis for treatment or other  patient management decisions.  A negative result may occur with  improper specimen collection / handling, submission of specimen other  than nasopharyngeal swab, presence of viral mutation(s) within the  areas targeted by this assay, and inadequate number of viral copies  (<250 copies / mL). A negative result must be combined with clinical  observations, patient history, and epidemiological information. If result is POSITIVE SARS-CoV-2 target nucleic acids are DETECTED. The SARS-CoV-2 RNA is generally detectable in upper and lower  respiratory specimens dur ing the acute phase of infection.  Positive  results are indicative of active infection with SARS-CoV-2.  Clinical  correlation with patient history and other diagnostic information is  necessary to determine patient infection status.  Positive results do  not rule out bacterial infection or co-infection with other viruses. If result is PRESUMPTIVE POSTIVE SARS-CoV-2 nucleic acids MAY BE PRESENT.   A presumptive positive result was obtained on the submitted specimen  and confirmed on repeat testing.  While 2019 novel coronavirus  (SARS-CoV-2) nucleic acids may be present in the submitted sample  additional confirmatory testing may be necessary for epidemiological  and / or clinical management purposes  to differentiate between  SARS-CoV-2 and other Sarbecovirus currently known to infect humans.  If clinically  indicated additional testing with an alternate test  methodology [phone]) is advised. The SARS-CoV-2 RNA is generally  detectable in upper and lower respiratory sp ecimens during the acute  phase of infection. The expected result is Negative. Fact Sheet for Patients:  StrictlyIdeas.no Fact Sheet for Healthcare Providers: BankingDealers.co.za This test is not yet approved or cleared by the [place] FDA and has been authorized for detection and/or diagnosis of SARS-CoV-2 by FDA under an Emergency Use Authorization (EUA).  This EUA will remain in effect (meaning this test can be used) for the duration of the COVID-19 declaration under Section 564(b)(1) of  the Act, 21 U.S.C. section 360bbb-3(b)(1), unless the authorization is terminated or revoked sooner. Performed at [facility], [address] [place], [place] [address]   C difficile quick scan w PCR reflex     Status: Abnormal   Collection Time: 07/22/19  1:50 PM   Specimen: STOOL  Result Value Ref Range Status   C Diff antigen POSITIVE (A) NEGATIVE Final   C Diff toxin NEGATIVE NEGATIVE Final   C Diff interpretation Results are indeterminate. See PCR results.  Final    Comment: Performed at [facility], [address] [place], [place] [address]  C. Diff by PCR, Reflexed     Status: Abnormal   Collection Time: 07/22/19  1:50 PM  Result Value Ref Range Status   Toxigenic C. Difficile by PCR POSITIVE (A) NEGATIVE Final    Comment: Positive for toxigenic C. difficile with little to no toxin production. Only treat if clinical presentation suggests symptomatic illness. Performed at [facility], [place] [address] [place], [place] [address]    Time coordinating discharge: 38 minutes   SIGNED:  Irwin Brakeman, MD  [facility] 07/25/2019, 9:44 AM How to contact the [facility] Attending or Consulting provider [place] or covering provider during after hours [place], for this  patient?  1. Check the care team in [facility] and look for a) attending/consulting TRH provider listed and b) the [facility] team listed 2. Log into [email or web address] and use [place]'s universal password to access. If you do not have the password, please contact the hospital operator. 3. Locate the [facility] provider you are looking for under [facility] and page to a number that you can be directly reached. 4. If you still have difficulty reaching the provider, please page the [facility] (Director on Call) for the Hospitalists listed on amion for assistance.

## 2019-07-26 LAB — CULTURE, BLOOD (ROUTINE X 2)
Culture: NO GROWTH
Culture: NO GROWTH
Special Requests: ADEQUATE
Special Requests: ADEQUATE

## 2019-07-26 LAB — SARS CORONAVIRUS 2 BY RT PCR (HOSPITAL ORDER, PERFORMED IN ~~LOC~~ HOSPITAL LAB): SARS Coronavirus 2: NEGATIVE

## 2019-07-26 NOTE — Care Management Important Message (Signed)
Important Message  Patient Details  Name: [name] MRN: [id number] Date of Birth: June 18, 1941   Medicare Important Message Given:  Yes     Tommy Medal 07/26/2019, 1:57 PM

## 2019-07-26 NOTE — Clinical Social Work Note (Signed)
Cable, Fearn #[id number] (CSN: [id number]) (78 y.o. M) (Adm: 07/21/19) [id number] Microbiology Results  Procedure Component Value Units Date/Time  SARS Coronavirus 2 [facility] order, Performed in [facility] hospital lab) Nasopharyngeal Nasopharyngeal Swab [[id number]] Collected: 07/26/19 1036  Order Status: Completed Specimen: Nasopharyngeal Swab Updated: 07/26/19 1341   SARS Coronavirus 2 NEGATIVE   Comment: (NOTE)  If result is NEGATIVE  SARS-CoV-2 target nucleic acids are NOT DETECTED.  The SARS-CoV-2 RNA is generally detectable in upper and lower  respiratory specimens during the acute phase of infection. The lowest  concentration of SARS-CoV-2 viral copies this assay can detect is 250  copies / mL. A negative result does not preclude SARS-CoV-2 infection  and should not be used as the sole basis for treatment or other  patient management decisions. A negative result may occur with  improper specimen collection / handling, submission of specimen other  than nasopharyngeal swab, presence of viral mutation(s) within the  areas targeted by this assay, and inadequate number of viral copies  (<250 copies / mL). A negative result must be combined with clinical  observations, patient history, and epidemiological information.  If result is POSITIVE  SARS-CoV-2 target nucleic acids are DETECTED.  The SARS-CoV-2 RNA is generally detectable in upper and lower  respiratory specimens during the acute phase of infection. Positive  results are indicative of active infection with SARS-CoV-2. Clinical  correlation with patient history and other diagnostic information is  necessary to determine patient infection status. Positive results do  not rule out bacterial infection or co-infection with other viruses.  If result is PRESUMPTIVE POSTIVE  SARS-CoV-2 nucleic acids MAY BE PRESENT.   A presumptive positive result was obtained on the submitted specimen  and confirmed on repeat  testing. While 2019 novel coronavirus  (SARS-CoV-2) nucleic acids may be present in the submitted sample  additional confirmatory testing may be necessary for epidemiological  and / or clinical management purposes to differentiate between  SARS-CoV-2 and other Sarbecovirus currently known to infect humans.  If clinically indicated additional testing with an alternate test  methodology [phone]) is advised. The SARS-CoV-2 RNA is generally  detectable in upper and lower respiratory specimens during the acute  phase of infection.  The expected result is Negative.  Fact Sheet for Patients: StrictlyIdeas.no  Fact Sheet for Healthcare Providers:  BankingDealers.co.za  This test is not yet approved or cleared by the [place] FDA and  has been authorized for detection and/or diagnosis of SARS-CoV-2 by  FDA under an Emergency Use Authorization (EUA). This EUA will remain  in effect (meaning this test can be used) for the duration of the  COVID-19 declaration under Section 564(b)(1) of the Act, 21 U.S.C.  section 360bbb-3(b)(1), unless the authorization is terminated or  revoked sooner.  Performed at [facility], [address] [place], [place] [address]

## 2019-07-26 NOTE — Progress Notes (Signed)
Discharge order noted in chart. Have contacted Barbaraann Rondo with Case Management to verify if SNF approval and bed placement. Are still waiting for results of rapid Covid test collected this am.

## 2019-07-26 NOTE — Progress Notes (Signed)
Notified by SW that patient does not yet have insurance approval for EMS transport to SNF [facility]. Discharge on hold for today. Called patient's wife, [name], and explained that he would stay here again tonight with another attempt at discharge tomorrow. She stated understanding.

## 2019-07-26 NOTE — TOC Transition Note (Signed)
Transition of Care [facility]) - CM/SW Discharge Note   Patient Details  Name: [name] MRN: [id number] Date of Birth: 04-06-1941  [facility]) CM/SW Contact:  Trish Mage, LCSW Phone Number: 07/26/2019, 3:37 PM   Clinical Narrative:   Today, insurance authorized placement at [facility]. They required another COVID 19 test, and the results came back negative.  Yesterday, TOC worker had faxed transportation request to [place] in anticipation of transfer today.  When they were called this afternoon at 1:45, they stated they had not received FAX.  Refaxed at 1:59.  Called back at 3:30 to ask about authorization, they stated they have up to 24 hours to review and had not yet reveiwed.  Pt will transfer tomorrow.    Final next level of care: Skilled Nursing Facility Barriers to Discharge: Continued Medical Work up   Patient Goals and CMS Choice   CMS Medicare.gov Compare Post Acute Care list provided to:: Patient Represenative (must comment)(wife) Choice offered to / list presented to : Spouse  Discharge Placement              Patient chooses bed at: [facility] Patient to be transferred to facility by: [facility]   Patient and family notified of of transfer: 07/26/19  Discharge Plan and Services     Post Acute Care Choice: [place]                               Social Determinants of Health (SDOH) Interventions     Readmission Risk Interventions Readmission Risk Prevention Plan 07/24/2019  Transportation Screening Complete  Medication Review Press photographer) Complete  HRI or [place] Not Complete  SW Recovery Care/Counseling Consult Complete  Palliative Care Screening Not Complete  Skilled Nursing Facility Complete  Some recent data might be hidden

## 2019-07-26 NOTE — Progress Notes (Addendum)
Patient seen and evaluated at bedside with no new complaints or concerns noted overnight.  Repeat COVID testing has been ordered and pending.  Patient may transfer to SNF once returned negative.  Please see discharge summary on 8/11 for full details regarding this hospitalization.  Total care time: 15 minutes.

## 2019-07-26 NOTE — Plan of Care (Signed)

## 2019-07-27 DIAGNOSIS — N179 Acute kidney failure, unspecified: Secondary | ICD-10-CM | POA: Diagnosis not present

## 2019-07-27 DIAGNOSIS — M6281 Muscle weakness (generalized): Secondary | ICD-10-CM | POA: Diagnosis not present

## 2019-07-27 DIAGNOSIS — I130 Hypertensive heart and chronic kidney disease with heart failure and stage 1 through stage 4 chronic kidney disease, or unspecified chronic kidney disease: Secondary | ICD-10-CM | POA: Diagnosis not present

## 2019-07-27 DIAGNOSIS — E860 Dehydration: Secondary | ICD-10-CM | POA: Diagnosis not present

## 2019-07-27 DIAGNOSIS — I5042 Chronic combined systolic (congestive) and diastolic (congestive) heart failure: Secondary | ICD-10-CM | POA: Diagnosis not present

## 2019-07-27 DIAGNOSIS — F0281 Dementia in other diseases classified elsewhere with behavioral disturbance: Secondary | ICD-10-CM | POA: Diagnosis not present

## 2019-07-27 DIAGNOSIS — K922 Gastrointestinal hemorrhage, unspecified: Secondary | ICD-10-CM | POA: Diagnosis not present

## 2019-07-27 DIAGNOSIS — F339 Major depressive disorder, recurrent, unspecified: Secondary | ICD-10-CM | POA: Diagnosis not present

## 2019-07-27 DIAGNOSIS — A0472 Enterocolitis due to Clostridium difficile, not specified as recurrent: Secondary | ICD-10-CM | POA: Diagnosis not present

## 2019-07-27 DIAGNOSIS — D649 Anemia, unspecified: Secondary | ICD-10-CM | POA: Diagnosis not present

## 2019-07-27 DIAGNOSIS — G20 Parkinson's disease: Secondary | ICD-10-CM | POA: Diagnosis not present

## 2019-07-27 DIAGNOSIS — R1312 Dysphagia, oropharyngeal phase: Secondary | ICD-10-CM | POA: Diagnosis not present

## 2019-07-27 DIAGNOSIS — R41841 Cognitive communication deficit: Secondary | ICD-10-CM | POA: Diagnosis not present

## 2019-07-27 DIAGNOSIS — N19 Unspecified kidney failure: Secondary | ICD-10-CM | POA: Diagnosis not present

## 2019-07-27 DIAGNOSIS — R410 Disorientation, unspecified: Secondary | ICD-10-CM | POA: Diagnosis not present

## 2019-07-27 DIAGNOSIS — R2689 Other abnormalities of gait and mobility: Secondary | ICD-10-CM | POA: Diagnosis not present

## 2019-07-27 DIAGNOSIS — Z743 Need for continuous supervision: Secondary | ICD-10-CM | POA: Diagnosis not present

## 2019-07-27 MED ORDER — VANCOMYCIN 50 MG/ML ORAL SOLUTION: 125.0000 mg | Freq: Four times a day (QID) | ORAL | 0 refills | Status: AC

## 2019-07-27 NOTE — Progress Notes (Signed)
Nsg Discharge Note  Admit Date:  07/21/2019 Discharge date: 07/27/2019   [name] to be D/C'd Skilled nursing facility per MD order.  AVS completed.  Copy for chart, and copy for patient signed, and dated. Patient/caregiver able to verbalize understanding. Removed IV-clean, dry, intact. Called report to [place] at [facility]. To be transported by [facility].  Discharge Medication: Allergies as of 07/27/2019   No Known Allergies     Medication List    STOP taking these medications   acetaminophen 650 MG CR tablet Commonly known as: TYLENOL Replaced by: acetaminophen 325 MG tablet   docusate sodium 100 MG capsule Commonly known as: COLACE   potassium chloride SA 20 MEQ tablet Commonly known as: K-DUR     TAKE these medications   acetaminophen 325 MG tablet Commonly known as: TYLENOL Take 2 tablets (650 mg total) by mouth every 6 (six) hours as needed for mild pain (or Fever >/= 101). Replaces: acetaminophen 650 MG CR tablet   amLODipine 5 MG tablet Commonly known as: NORVASC Take 1 tablet (5 mg total) by mouth daily.   aspirin EC 81 MG tablet Take 81 mg by mouth daily.   atorvastatin 40 MG tablet Commonly known as: LIPITOR Take 1 tablet (40 mg total) by mouth daily at 6 PM.   carbidopa-levodopa 25-100 MG tablet Commonly known as: SINEMET IR Take 1 tablet by mouth 2 (two) times daily.   carvedilol 12.5 MG tablet Commonly known as: COREG Take 1 tablet (12.5 mg total) by mouth 2 (two) times daily with a meal.   donepezil 10 MG tablet Commonly known as: ARICEPT Take 10 mg by mouth at bedtime.   DULoxetine 60 MG capsule Commonly known as: CYMBALTA Take 60 mg by mouth at bedtime.   famotidine 20 MG tablet Commonly known as: PEPCID Take 20 mg by mouth 2 (two) times daily.   ferrous sulfate 325 (65 FE) MG tablet Take 1 tablet (325 mg total) by mouth daily with breakfast. What changed: when to take this   folic acid 1 MG tablet Commonly known as:  FOLVITE Take 1 mg by mouth at bedtime.   furosemide 20 MG tablet Commonly known as: Lasix Take 1 tablet (20 mg total) by mouth daily as needed (for weight gain greater than 3 pounds overnight or lower extremity edema).   hydrOXYzine 25 MG tablet Commonly known as: ATARAX/VISTARIL Take 25 mg by mouth 3 (three) times daily.   isosorbide dinitrate 20 MG tablet Commonly known as: ISORDIL Take 20 mg by mouth 3 (three) times daily.   levothyroxine 100 MCG tablet Commonly known as: SYNTHROID   multivitamin with minerals tablet Take 1 tablet by mouth every morning.   nitroGLYCERIN 0.4 MG SL tablet Commonly known as: NITROSTAT Place 1 tablet (0.4 mg total) under the tongue every 5 (five) minutes as needed for chest pain.   oxybutynin 5 MG tablet Commonly known as: DITROPAN Take 5 mg by mouth 2 (two) times daily.   rasagiline 1 MG Tabs tablet Commonly known as: AZILECT Take 1 tablet by mouth daily.   saccharomyces boulardii 250 MG capsule Commonly known as: FLORASTOR Take 1 capsule (250 mg total) by mouth 2 (two) times daily.   vancomycin 50 mg/mL  oral solution Commonly known as: VANCOCIN Take 2.5 mLs (125 mg total) by mouth 4 (four) times daily for 6 days.       Discharge Assessment: Vitals:   07/27/19 0502 07/27/19 1440  BP: 139/74 126/77  Pulse: 69 76  Resp: Marland Kitchen)  22 17  Temp: 98.6 F (37 C)   SpO2: 99% 99%   Skin clean, dry and intact without evidence of skin break down, no evidence of skin tears noted. IV catheter discontinued intact. Site without signs and symptoms of complications - no redness or edema noted at insertion site, patient denies c/o pain - only slight tenderness at site.  Dressing with slight pressure applied.  D/c Instructions-Education: Discharge instructions given to patient/family with verbalized understanding. D/c education completed with patient/family including follow up instructions, medication list, d/c activities limitations if indicated,  with other d/c instructions as indicated by MD - patient able to verbalize understanding, all questions fully answered. Patient instructed to return to ED, call 911, or call MD for any changes in condition.  Patient escorted via [place], and D/C home via private auto.  Santa Lighter, RN 07/27/2019 2:55 PM

## 2019-07-27 NOTE — Progress Notes (Signed)
Patient seen and evaluated at bedside with no new complaints noted overnight.  He had one bowel movement at approximately 11 PM last night no further this morning.  His repeat COVID testing has returned negative.  Please see discharge summary on 8/11 for full details regarding regarding his hospitalization.  His vancomycin dosage has been updated to 6 more days on this day of discharge.  Total CARE time: 20 minutes.

## 2019-07-27 NOTE — TOC Transition Note (Signed)
Transition of Care [facility]) - CM/SW Discharge Note   Patient Details  Name: [name] MRN: [id number] Date of Birth: [birth year]  Transition of Care [facility]) CM/SW Contact:  Trish Mage, LCSW Phone Number: 07/27/2019, 10:17 AM   Clinical Narrative:   Mr Lizotte was approved for transportation by his insurance.  Reference number [id number]. I called wife to let her know. Nursing to call report to [phone].  Transportation to be provided by [employer].  I filled out medical necessity form.  TOC sign off.    Final next level of care: Skilled Nursing Facility Barriers to Discharge: Barriers Resolved   Patient Goals and CMS Choice   CMS Medicare.gov Compare Post Acute Care list provided to:: Patient Represenative (must comment)(wife) Choice offered to / list presented to : Spouse  Discharge Placement              Patient chooses bed at: [facility] Patient to be transferred to facility by: [facility]   Patient and family notified of of transfer: 07/26/19  Discharge Plan and Services     Post Acute Care Choice: [place]                               Social Determinants of Health (SDOH) Interventions     Readmission Risk Interventions Readmission Risk Prevention Plan 07/24/2019  Transportation Screening Complete  Medication Review Press photographer) Complete  HRI or [place] Not Complete  SW Recovery Care/Counseling Consult Complete  Palliative Care Screening Not Complete  Skilled Nursing Facility Complete  Some recent data might be hidden

## 2019-07-27 NOTE — Plan of Care (Signed)
  Problem: Education: Goal: Knowledge of General Education information will improve Description: Including pain rating scale, medication(s)/side effects and non-pharmacologic comfort measures 07/27/2019 1147 by Santa Lighter, RN Outcome: Adequate for Discharge 07/27/2019 1131 by Santa Lighter, RN Outcome: Progressing   Problem: Health Behavior/Discharge Planning: Goal: Ability to manage health-related needs will improve 07/27/2019 1147 by Santa Lighter, RN Outcome: Adequate for Discharge 07/27/2019 1131 by Santa Lighter, RN Outcome: Progressing   Problem: Clinical Measurements: Goal: Ability to maintain clinical measurements within normal limits will improve Outcome: Adequate for Discharge Goal: Will remain free from infection Outcome: Adequate for Discharge Goal: Diagnostic test results will improve Outcome: Adequate for Discharge Goal: Respiratory complications will improve Outcome: Adequate for Discharge Goal: Cardiovascular complication will be avoided Outcome: Adequate for Discharge   Problem: Activity: Goal: Risk for activity intolerance will decrease Outcome: Adequate for Discharge   Problem: Nutrition: Goal: Adequate nutrition will be maintained Outcome: Adequate for Discharge   Problem: Coping: Goal: Level of anxiety will decrease Outcome: Adequate for Discharge   Problem: Elimination: Goal: Will not experience complications related to bowel motility Outcome: Adequate for Discharge Goal: Will not experience complications related to urinary retention Outcome: Adequate for Discharge   Problem: Elimination: Goal: Will not experience complications related to bowel motility Outcome: Adequate for Discharge Goal: Will not experience complications related to urinary retention Outcome: Adequate for Discharge   Problem: Pain Managment: Goal: General experience of comfort will improve Outcome: Adequate for Discharge   Problem: Safety: Goal: Ability to remain free  from injury will improve Outcome: Adequate for Discharge   Problem: Skin Integrity: Goal: Risk for impaired skin integrity will decrease Outcome: Adequate for Discharge

## 2019-07-27 NOTE — Plan of Care (Signed)
  Problem: Education: Goal: Knowledge of General Education information will improve Description Including pain rating scale, medication(s)/side effects and non-pharmacologic comfort measures Outcome: Progressing   Problem: Health Behavior/Discharge Planning: Goal: Ability to manage health-related needs will improve Outcome: Progressing   

## 2019-07-28 DIAGNOSIS — E860 Dehydration: Secondary | ICD-10-CM | POA: Diagnosis not present

## 2019-07-28 DIAGNOSIS — D649 Anemia, unspecified: Secondary | ICD-10-CM | POA: Diagnosis not present

## 2019-07-28 DIAGNOSIS — N19 Unspecified kidney failure: Secondary | ICD-10-CM | POA: Diagnosis not present

## 2019-07-28 DIAGNOSIS — A0472 Enterocolitis due to Clostridium difficile, not specified as recurrent: Secondary | ICD-10-CM | POA: Diagnosis not present

## 2019-08-17 DIAGNOSIS — Z9181 History of falling: Secondary | ICD-10-CM | POA: Diagnosis not present

## 2019-08-17 DIAGNOSIS — I5043 Acute on chronic combined systolic (congestive) and diastolic (congestive) heart failure: Secondary | ICD-10-CM | POA: Diagnosis not present

## 2019-08-22 DIAGNOSIS — E039 Hypothyroidism, unspecified: Secondary | ICD-10-CM | POA: Diagnosis not present

## 2019-08-22 DIAGNOSIS — I214 Non-ST elevation (NSTEMI) myocardial infarction: Secondary | ICD-10-CM | POA: Diagnosis not present

## 2019-08-22 DIAGNOSIS — E782 Mixed hyperlipidemia: Secondary | ICD-10-CM | POA: Diagnosis not present

## 2019-08-22 DIAGNOSIS — M10041 Idiopathic gout, right hand: Secondary | ICD-10-CM | POA: Diagnosis not present

## 2019-08-22 DIAGNOSIS — F419 Anxiety disorder, unspecified: Secondary | ICD-10-CM | POA: Diagnosis not present

## 2019-08-22 DIAGNOSIS — I2510 Atherosclerotic heart disease of native coronary artery without angina pectoris: Secondary | ICD-10-CM | POA: Diagnosis not present

## 2019-08-22 DIAGNOSIS — E876 Hypokalemia: Secondary | ICD-10-CM | POA: Diagnosis not present

## 2019-08-22 DIAGNOSIS — I10 Essential (primary) hypertension: Secondary | ICD-10-CM | POA: Diagnosis not present

## 2019-08-22 DIAGNOSIS — I5043 Acute on chronic combined systolic (congestive) and diastolic (congestive) heart failure: Secondary | ICD-10-CM | POA: Diagnosis not present

## 2019-08-22 DIAGNOSIS — N183 Chronic kidney disease, stage 3 (moderate): Secondary | ICD-10-CM | POA: Diagnosis not present

## 2019-08-23 ENCOUNTER — Encounter: Payer: Self-pay | Admitting: Allergy & Immunology

## 2019-08-23 ENCOUNTER — Ambulatory Visit (INDEPENDENT_AMBULATORY_CARE_PROVIDER_SITE_OTHER): Payer: PPO | Admitting: Allergy & Immunology

## 2019-08-23 ENCOUNTER — Other Ambulatory Visit: Payer: Self-pay

## 2019-08-23 VITALS — BP 102/68 | HR 84 | Temp 98.6°F | Resp 20 | Ht 68.5 in | Wt 183.9 lb

## 2019-08-23 DIAGNOSIS — R21 Rash and other nonspecific skin eruption: Secondary | ICD-10-CM

## 2019-08-23 MED ORDER — TRIAMCINOLONE ACETONIDE 0.5 % EX OINT: 1.0000 "application " | TOPICAL_OINTMENT | Freq: Two times a day (BID) | CUTANEOUS | 5 refills | Status: DC

## 2019-08-23 NOTE — Progress Notes (Signed)
NEW PATIENT  Date of Service/Encounter:  08/23/19  Referring provider: Celene Squibb, MD   Assessment:   Rash - likely drug rash  Complex medical history, including dementia   I am unsure what is causing Mr. Aaidyn's rash, but I conjecture that it is related to a drug that he is on.  We are going to get the biopsy results from Dr. Shepard [facility] office to try to clarify this more.  We are going to also add on a compounded triamcinolone with Eucerin to try to moisturize his skin and control the inflammation.  I think controlling the itch will also help make him more comfortable, although we need to be careful given the elderly's susceptibility to the side effects of antihistamines.  We certainly do not want add on more medications that would make Mr. Telvis his dementia worse.  Plan/Recommendations:   1. Rash -I am not sure what is causing the rash, but will try to figure this out. -I am getting get some lab work to look for environmental allergies as well as some food allergies. -We will call you in 1 to 2 weeks for the results of the testing. -In the meantime, start the following medications:  Zyrtec 10 mg up to twice daily (to control itching).  Triamcinolone/Eucerin applied twice daily to the entire body -We also getting get the pathology results from Dr. Nevada Crane and his office notes so that we can try to gauge what he thought was going on.  2. Return in about 6 weeks (around 10/04/2019). This can be an in-person, a virtual Webex or a telephone follow up visit.  Subjective:   Kweisi Kribs is a 78 y.o. male presenting today for evaluation of  Chief Complaint  Patient presents with   Rash    all over body. ongoing for months now. seems to be worsening.     Daunte Mohammad has a history of the following: Patient Active Problem List   Diagnosis Date Noted   ARF (acute renal failure) ([place]) 07/21/2019   Nausea vomiting and diarrhea 07/21/2019   Dehydration 07/21/2019    Upper GI bleed 07/21/2019   Cardiomyopathy EF 30-35%    Parkinson's disease (HCC)    Hypothyroidism    Elevated BUN    Lobar pneumonia ([place]) 02/10/2019   Acute on chronic respiratory failure with hypoxia ([place]) 02/10/2019   NSTEMI (non-ST elevated myocardial infarction) ([place]) 02/10/2019   Acute on chronic combined systolic and diastolic CHF (congestive heart failure) ([place]) 02/10/2019   Non-STEMI (non-ST elevated myocardial infarction) ([place]) 02/10/2019   Chest pain 02/09/2019   Acute on chronic systolic CHF (congestive heart failure) ([place]) 02/09/2019   Acute on chronic combined systolic and diastolic HF (heart failure) ([place])    Dementia due to Parkinson's disease with behavioral disturbance ([place])    Hyperlipidemia    Essential hypertension    CKD (chronic kidney disease) stage 3, GFR 30-59 ml/min (HCC)    Elevated troponin    Hypokalemia    SOB (shortness of breath) 06/08/2018   Symptomatic stenosis of right carotid artery 10/07/2016   Preoperative cardiovascular examination 09/02/2016   Abnormal EKG 09/02/2016   Murmur 09/02/2016   Carotid artery stenosis with cerebral infarction ([place]) 09/02/2016    History obtained from: chart review and patient and step-daughter.  Lavert Modena was referred by Celene Squibb, MD.     Victoria is a 78 y.o. male presenting for an evaluation of a rash.  The history is rather vague.  The patient  insists on giving me the majority of the story, and whenever his stepdaughter pipes and to provide some history, clearance snaps at her.  It seems that he has had this rash for several months, going on more than a year.  He and his stepdaughter denies any medication additions around the time that the rash started.  It has not seemed to change depending on his environment as well.  There was one point when he was in a nursing facility for a few weeks in the midst of all this and he continued to have the rash.  They have changed his laundry  detergent to free and clear.  This did not seem to make much of a difference.  There is no change to his environment at home.  There are cats and dogs at home, but these have been there for quite some time.  He does not think this is related to foods at all.  He tolerates all of the major food allergens without adverse event.  He has had no systemic symptoms aside from the rash, including throat swelling, stomach pain, or swelling.  Of note, he has had swelling of his bilateral lower extremities which is thought to be due to fluid retention.  His stepdaughter reports that he has been placed on Lasix for this.  It seems that he apparently saw dermatology somewhere at the end of June, although I do not have these records.  He was given a prednisone Dosepak at that time for a suspected drug reaction.  Per review of his PCP note, the most likely culprits were atorvastatin, Sinemet, and Lasix.  Dr. Edwyna Ready call felt that his Lasix and Sinemet were necessary for his chronic health conditions, so he was wondering if there was anything else we could add to help him to be able to tolerate these medications better.  It seems that he has Zyrtec at home, but he is not using it.  He has also been prescribed Pepcid.  They have been using Neosporin on the rash which has not seemed to have changed the trajectory of it.  They do have hydrocortisone but he does not use that much.  He has no stronger steroids at all.  It seems that he did see Dr. Allyn Kenner, the dermatologist, and had a biopsy performed.  Neither the patient nor his stepdaughter is aware of the results of this biopsy.  They are willing to fill out a release of information form so that we can get these results.   Otherwise, there is no history of other atopic diseases, including asthma, food allergies, drug allergies, environmental allergies, stinging insect allergies or contact dermatitis. There is no significant infectious history. Vaccinations are up to date.     Past Medical History: Patient Active Problem List   Diagnosis Date Noted   ARF (acute renal failure) ([place]) 07/21/2019   Nausea vomiting and diarrhea 07/21/2019   Dehydration 07/21/2019   Upper GI bleed 07/21/2019   Cardiomyopathy EF 30-35%    Parkinson's disease (HCC)    Hypothyroidism    Elevated BUN    Lobar pneumonia ([place]) 02/10/2019   Acute on chronic respiratory failure with hypoxia ([place]) 02/10/2019   NSTEMI (non-ST elevated myocardial infarction) ([place]) 02/10/2019   Acute on chronic combined systolic and diastolic CHF (congestive heart failure) ([place]) 02/10/2019   Non-STEMI (non-ST elevated myocardial infarction) ([place]) 02/10/2019   Chest pain 02/09/2019   Acute on chronic systolic CHF (congestive heart failure) ([place]) 02/09/2019   Acute on chronic  combined systolic and diastolic HF (heart failure) (HCC)    Dementia due to Parkinson's disease with behavioral disturbance (HCC)    Hyperlipidemia    Essential hypertension    CKD (chronic kidney disease) stage 3, GFR 30-59 ml/min (HCC)    Elevated troponin    Hypokalemia    SOB (shortness of breath) 06/08/2018   Symptomatic stenosis of right carotid artery 10/07/2016   Preoperative cardiovascular examination 09/02/2016   Abnormal EKG 09/02/2016   Murmur 09/02/2016   Carotid artery stenosis with cerebral infarction ([place]) 09/02/2016    Medication List:  Allergies as of 08/23/2019   No Known Allergies     Medication List       Accurate as of August 23, 2019  3:26 PM. If you have any questions, ask your nurse or doctor.        acetaminophen 325 MG tablet Commonly known as: TYLENOL Take 2 tablets (650 mg total) by mouth every 6 (six) hours as needed for mild pain (or Fever >/= 101).   amLODipine 5 MG tablet Commonly known as: NORVASC Take 1 tablet (5 mg total) by mouth daily.   aspirin EC 81 MG tablet Take 81 mg by mouth daily.   atorvastatin 40 MG tablet Commonly known as:  LIPITOR Take 1 tablet (40 mg total) by mouth daily at 6 PM.   carbidopa-levodopa 25-100 MG tablet Commonly known as: SINEMET IR Take 1 tablet by mouth 2 (two) times daily.   carvedilol 12.5 MG tablet Commonly known as: COREG Take 1 tablet (12.5 mg total) by mouth 2 (two) times daily with a meal.   donepezil 10 MG tablet Commonly known as: ARICEPT Take 10 mg by mouth at bedtime.   DULoxetine 60 MG capsule Commonly known as: CYMBALTA Take 60 mg by mouth at bedtime.   famotidine 20 MG tablet Commonly known as: PEPCID Take 20 mg by mouth 2 (two) times daily.   ferrous sulfate 325 (65 FE) MG tablet Take 1 tablet (325 mg total) by mouth daily with breakfast.   folic acid 1 MG tablet Commonly known as: FOLVITE Take 1 mg by mouth at bedtime.   furosemide 20 MG tablet Commonly known as: Lasix Take 1 tablet (20 mg total) by mouth daily as needed (for weight gain greater than 3 pounds overnight or lower extremity edema).   hydrOXYzine 25 MG tablet Commonly known as: ATARAX/VISTARIL Take 25 mg by mouth 3 (three) times daily.   isosorbide dinitrate 20 MG tablet Commonly known as: ISORDIL Take 20 mg by mouth 3 (three) times daily.   levothyroxine 100 MCG tablet Commonly known as: SYNTHROID   multivitamin with minerals tablet Take 1 tablet by mouth every morning.   nitroGLYCERIN 0.4 MG SL tablet Commonly known as: NITROSTAT Place 1 tablet (0.4 mg total) under the tongue every 5 (five) minutes as needed for chest pain.   oxybutynin 5 MG tablet Commonly known as: DITROPAN Take 5 mg by mouth 2 (two) times daily.   rasagiline 1 MG Tabs tablet Commonly known as: AZILECT Take 1 tablet by mouth daily.   saccharomyces boulardii 250 MG capsule Commonly known as: FLORASTOR Take 1 capsule (250 mg total) by mouth 2 (two) times daily.       Birth History: non-contributory  Developmental History: non-contributory  Past Surgical History: Past Surgical History:  Procedure  Laterality Date   BLADDER REPAIR     pt sts "when I had my prostatectomy they cut too much and I have an internal button I  have to press in order to release my urine"   BLADDER SURGERY     2010  pump placed    CATARACT EXTRACTION [facility]  07/28/2011   Procedure: CATARACT EXTRACTION PHACO AND INTRAOCULAR LENS PLACEMENT ([place]);  Surgeon: Elta Guadeloupe T. Gershon Crane;  Location: [facility] ORS;  Service: Ophthalmology;  Laterality: Right;  CDE: 20.26   CATARACT EXTRACTION W/PHACO  09/08/2011   Procedure: CATARACT EXTRACTION PHACO AND INTRAOCULAR LENS PLACEMENT ([facility]);  Surgeon: Elta Guadeloupe T. Gershon Crane;  Location: [facility];  Service: Ophthalmology;  Laterality: Left;  CDE:37.31   ENDARTERECTOMY Right 10/07/2016   Procedure: RIGHT ENDARTERECTOMY CAROTID WITH LIGATION OF INTERNAL CAROTID;  Surgeon: Serafina Mitchell, MD;  Location: [place];  Service: Vascular;  Laterality: Right;   LEFT HEART CATH AND CORONARY ANGIOGRAPHY N/A 02/10/2019   Procedure: LEFT HEART CATH AND CORONARY ANGIOGRAPHY;  Surgeon: Leonie Man, MD;  Location: [place] CV LAB;  Service: Cardiovascular;  Laterality: N/A;   PATCH ANGIOPLASTY Right 10/07/2016   Procedure: PATCH ANGIOPLASTY USING Rueben Bash BIOLOGIC PATCH;  Surgeon: Serafina Mitchell, MD;  [facility];  Service: Vascular;  Laterality: Right;   PROSTATECTOMY     TEE WITHOUT CARDIOVERSION N/A 06/10/2018   Procedure: TRANSESOPHAGEAL ECHOCARDIOGRAM (TEE);  Surgeon: Arnoldo Lenis, MD;  Location[facility];  Service: Endoscopy;  Laterality: N/A;   YAG LASER APPLICATION  [id number]   Procedure: YAG LASER APPLICATION;  Surgeon: Elta Guadeloupe T. Gershon Crane, MD;  Location: [facility];  Service: Ophthalmology;  Laterality: Right;     Family History: Family History  Problem Relation Age of Onset   Cancer Mother    Cancer Father    Heart Problems Brother        has PPM   Anesthesia problems Neg Hx    Hypotension Neg Hx    Malignant hyperthermia Neg Hx    Pseudochol deficiency Neg Hx       Social History: Davone lives at home with his wife, Psychiatrist, and granddaughter.  They live in a house that is about 77 years old.  There is carpeting throughout the home.  They have gas heating and central cooling.  There are cats and dogs inside of the home.  There are no dust mite covers on the bedding.  There is no tobacco exposure.  He is retired.  They do not use a HEPA filter in their home.    Review of Systems  Constitutional: Negative.  Negative for chills, fever, malaise/fatigue and weight loss.  HENT: Negative.  Negative for congestion, ear discharge, ear pain, sinus pain and sore throat.   Eyes: Negative for pain, discharge and redness.  Respiratory: Negative for cough, sputum production, shortness of breath and wheezing.   Cardiovascular: Negative.  Negative for chest pain and palpitations.  Gastrointestinal: Negative for abdominal pain, constipation, diarrhea, heartburn, nausea and vomiting.  Skin: Positive for itching and rash.  Neurological: Negative for dizziness and headaches.  Endo/Heme/Allergies: Negative for environmental allergies. Does not bruise/bleed easily.       Objective:   Blood pressure 102/68, pulse 84, temperature 98.6 F (37 C), temperature source Temporal, resp. rate 20, height 5' 8.5" (1.74 m), weight 183 lb 13.8 oz (83.4 kg), SpO2 97 %. Body mass index is 27.55 kg/m.   Physical Exam:   Physical Exam  Constitutional: He appears well-developed.  Stephen[place] November male.  HENT:  Head: Normocephalic and atraumatic.  Right Ear: Tympanic membrane, external ear and ear canal normal. No drainage, swelling or tenderness. Tympanic membrane is not injected, not  scarred, not erythematous, not retracted and not bulging.  Left Ear: Tympanic membrane, external ear and ear canal normal. No drainage, swelling or tenderness. Tympanic membrane is not injected, not scarred, not erythematous, not retracted and not bulging.  Nose: No mucosal edema, rhinorrhea,  nasal deformity or septal deviation. No epistaxis. Right sinus exhibits no maxillary sinus tenderness and no frontal sinus tenderness. Left sinus exhibits no maxillary sinus tenderness and no frontal sinus tenderness.  Mouth/Throat: Uvula is midline and oropharynx is clear and moist. Mucous membranes are not pale and not dry.  Eyes: Pupils are equal, round, and reactive to light. Conjunctivae and EOM are normal. Right eye exhibits no chemosis and no discharge. Left eye exhibits no chemosis and no discharge. Right conjunctiva is not injected. Left conjunctiva is not injected.  Cardiovascular: Normal rate, regular rhythm and normal heart sounds.  Respiratory: Effort normal and breath sounds normal. No accessory muscle usage. No tachypnea. No respiratory distress. He has no wheezes. He has no rhonchi. He has no rales. He exhibits no tenderness.  GI: There is no abdominal tenderness. There is no rebound and no guarding.  Musculoskeletal:     Comments: Lower extremities are edematous.  Lymphadenopathy:       Head (right side): No submandibular, no tonsillar and no occipital adenopathy present.       Head (left side): No submandibular, no tonsillar and no occipital adenopathy present.    He has no cervical adenopathy.  Neurological: He is alert.  Skin: No abrasion, no petechiae and no rash noted. Rash is not papular, not vesicular and not urticarial. No erythema. No pallor.  There is a rash over the majority of his body, which is centered on the anterior chest. It encompassed his back as well as abdomen and legs. There are some honey crusted areas that are not actively draining.   Psychiatric: He has a normal mood and affect.         Diagnostic studies: labs sent instead      Salvatore Marvel, MD [facility] [place] [facility] [place]

## 2019-08-23 NOTE — Patient Instructions (Addendum)
1. Rash -I am not sure what is causing the rash, but will try to figure this out. -I am getting get some lab work to look for environmental allergies as well as some food allergies. -We will call you in 1 to 2 weeks for the results of the testing. -In the meantime, start the following medications:  Zyrtec 10 mg up to twice daily (to control itching).  Triamcinolone/Eucerin applied twice daily to the entire body -We also getting get the pathology results from Dr. Nevada Crane and his office notes so that we can try to gauge what he thought was going on.  2. Return in about 6 weeks (around 10/04/2019). This can be an in-person, a virtual Webex or a telephone follow up visit.   Please inform us of any Emergency Department visits, hospitalizations, or changes in symptoms. Call us before going to the ED for breathing or allergy symptoms since we might be able to fit you in for a sick visit. Feel free to contact us anytime with any questions, problems, or concerns.  It was a pleasure to meet you and your family today!  Websites that have reliable patient information: 1. American Academy of Asthma, Allergy, and Immunology: www.aaaai.org 2. Food Allergy Research and Education (FARE): foodallergy.org 3. Mothers of Asthmatics: http://www.asthmacommunitynetwork.org 4. American College of Allergy, Asthma, and Immunology: www.acaai.org  "Like" [place] on Facebook and Instagram for our latest updates!      Make sure you are registered to vote! If you have moved or changed any of your contact information, you will need to get this updated before voting!  In some cases, you MAY be able to register to vote online: [email or web address]    Voter ID laws are NOT going into effect for the General Election in November 2020! DO NOT let this stop you from exercising your right to vote!   Absentee voting is the SAFEST way to vote during the coronavirus pandemic!   Download and print an  absentee ballot request form at [email or web address] or you can scan the QR code below with your smart phone:      More information on absentee ballots can be found here: [email or web address]

## 2019-08-24 ENCOUNTER — Telehealth: Payer: Self-pay | Admitting: *Deleted

## 2019-08-24 MED ORDER — TRIAMCINOLONE ACETONIDE 0.1 % EX OINT: 1.0000 "application " | TOPICAL_OINTMENT | Freq: Two times a day (BID) | CUTANEOUS | 5 refills | Status: AC

## 2019-08-24 NOTE — Addendum Note (Signed)
Addended by: Lucrezia Starch I on: 08/24/2019 12:13 PM   Modules accepted: Orders

## 2019-08-24 NOTE — Telephone Encounter (Signed)
Sure we can do the 0.1% concentration.  Ernest Marvel, MD [facility] [place] [facility] [place]

## 2019-08-24 NOTE — Telephone Encounter (Signed)
Prescription has been sent in as instructed.  

## 2019-08-24 NOTE — Telephone Encounter (Signed)
Pharmacy called and stated that the Triamcinolone mixed with Eucerin in the large jar would be too expensive for the patient and that they could only fill 15g. Pharmacy wanted to know if you wanted to switch the strengh to the .1%? Please advise.

## 2019-08-28 ENCOUNTER — Telehealth (INDEPENDENT_AMBULATORY_CARE_PROVIDER_SITE_OTHER): Payer: PPO | Admitting: Cardiology

## 2019-08-28 ENCOUNTER — Encounter: Payer: Self-pay | Admitting: Cardiology

## 2019-08-28 VITALS — Ht 69.0 in | Wt 180.0 lb

## 2019-08-28 DIAGNOSIS — I5022 Chronic systolic (congestive) heart failure: Secondary | ICD-10-CM

## 2019-08-28 DIAGNOSIS — I10 Essential (primary) hypertension: Secondary | ICD-10-CM | POA: Diagnosis not present

## 2019-08-28 NOTE — Progress Notes (Signed)
Virtual Visit via Telephone Note   This visit type was conducted due to national recommendations for restrictions regarding the COVID-19 Pandemic (e.g. social distancing) in an effort to limit this patient's exposure and mitigate transmission in our community.  Due to his co-morbid illnesses, this patient is at least at moderate risk for complications without adequate follow up.  This format is felt to be most appropriate for this patient at this time.  The patient did not have access to video technology/had technical difficulties with video requiring transitioning to audio format only (telephone).  All issues noted in this document were discussed and addressed.  No physical exam could be performed with this format.  Please refer to the patient's chart for his  consent to telehealth for [facility].   Date:  08/28/2019   ID:  [name], DOB 1941-09-10, MRN [id number]  Patient Location: Home Provider Location: Office  PCP:  Celene Squibb, MD  Cardiologist:  Carlyle Dolly, MD  Electrophysiologist:  None   Evaluation Performed:  Follow-Up Visit  Chief Complaint:  Follow up  History of Present Illness:    [name] is a 78 y.o. male seen today for follow up of the following medical problems.   1. Chronic systolic HF/CAD/ICM - admission 05/2018 with volume overload.  - 6/2019Echo LVEF [id number] II diastolic dyscuntion, apical hypokinesis,density attached to MV subvalvular apparatus - ischemic testing not pursued due to AKI at the time and dementia     01/2019 cath: severe 3 vessel disease as reported below. Poor targets for revasc and advanced comorbidities including dementa and CKD recs for medical therapy.  - 02/2019 echo: echo LVEF 99991111, grade I diastolic dysfunction   - medical therapy limited by prior hyperkalemia and AKI. Aldactone stopped, losartan stopped, KCl stopped, and lasix decreased    02/2019 echo: echo LVEF 99991111, grade I diastolic  ydsfunction.  05/2019 pcp stopped bididl due to rash, started isordil by itself - family denies any recent edema. Takes lasix just prn.  - no recent chest pain   2. MV density - 05/2018 TEE ruptured chordae tendinae  3.Recent admission - admit 07/2019 with AKI due to dehydration and diarrhea - admission complicated by cdiff   4. Parkinsons disease with dementia -   5. Rash - ongoing workup by allergist  The patient does not have symptoms concerning for COVID-19 infection (fever, chills, cough, or new shortness of breath).    Past Medical History:  Diagnosis Date   Anxiety    CAD (coronary artery disease)    a. 01/2019: cath showing severe multivessel disease with 100% CTO of mid RCA, 50% proximal circumflex with 90% AV groove followed by 100% CTO of LPL (that recanalizes through left left lateral), 90 to 95% bifurcation LAD-2nd Diag followed by 60% and then tandem 99 to 90% lesions in the mid to distal LAD. Not candidate for CABG or PCI. Med management recommended.    Cardiomyopathy ([place])    Chronic combined systolic and diastolic CHF (congestive heart failure) (HCC)    Dementia (HCC)    GERD (gastroesophageal reflux disease)    Hyperlipidemia    Hypertension    Incontinence    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   Myocardial infarction [facility])    Neuromuscular disorder ([place])    Parkinson's disease ([place])    Peripheral vascular disease ([place])    Past Surgical History:  Procedure Laterality Date   BLADDER REPAIR     pt sts "  when I had my prostatectomy they cut too much and I have an internal button I have to press in order to release my urine"   BLADDER SURGERY     2010  pump placed    CATARACT EXTRACTION W/PHACO  07/28/2011   Procedure: CATARACT EXTRACTION PHACO AND INTRAOCULAR LENS PLACEMENT (Green);  Surgeon: Elta Guadeloupe T. Gershon Crane;  Location: [facility];  Service: Ophthalmology;  Laterality: Right;  CDE: 20.26   CATARACT EXTRACTION W/PHACO   09/08/2011   Procedure: CATARACT EXTRACTION PHACO AND INTRAOCULAR LENS PLACEMENT (IOC);  Surgeon: Elta Guadeloupe T. Gershon Crane;  Location: [facility];  Service: Ophthalmology;  Laterality: Left;  CDE:37.31   ENDARTERECTOMY Right 10/07/2016   Procedure: RIGHT ENDARTERECTOMY CAROTID WITH LIGATION OF INTERNAL CAROTID;  Surgeon: Serafina Mitchell, MD;  Location: [place];  Service: Vascular;  Laterality: Right;   LEFT HEART CATH AND CORONARY ANGIOGRAPHY N/A 02/10/2019   Procedure: LEFT HEART CATH AND CORONARY ANGIOGRAPHY;  Surgeon: Leonie Man, MD;  Location: [place] CV LAB;  Service: Cardiovascular;  Laterality: N/A;   PATCH ANGIOPLASTY Right 10/07/2016   Procedure: PATCH ANGIOPLASTY USING Rueben Bash BIOLOGIC PATCH;  Surgeon: Serafina Mitchell, MD;  Location: [facility] OR;  Service: Vascular;  Laterality: Right;   PROSTATECTOMY     TEE WITHOUT CARDIOVERSION N/A 06/10/2018   Procedure: TRANSESOPHAGEAL ECHOCARDIOGRAM (TEE);  Surgeon: Arnoldo Lenis, MD;  [facility];  Service: Endoscopy;  Laterality: N/A;   YAG LASER APPLICATION  [id number]   Procedure: YAG LASER APPLICATION;  Surgeon: Elta Guadeloupe T. Gershon Crane, MD;  Location: [facility];  Service: Ophthalmology;  Laterality: Right;     Current Meds  Medication Sig   acetaminophen (TYLENOL) 325 MG tablet Take 2 tablets (650 mg total) by mouth every 6 (six) hours as needed for mild pain (or Fever >/= 101).   amLODipine (NORVASC) 5 MG tablet Take 1 tablet (5 mg total) by mouth daily.   aspirin EC 81 MG tablet Take 81 mg by mouth daily.   atorvastatin (LIPITOR) 40 MG tablet Take 1 tablet (40 mg total) by mouth daily at 6 PM.   carbidopa-levodopa (SINEMET IR) 25-100 MG tablet Take 1 tablet by mouth 2 (two) times daily.   carvedilol (COREG) 12.5 MG tablet Take 1 tablet (12.5 mg total) by mouth 2 (two) times daily with a meal.   donepezil (ARICEPT) 10 MG tablet Take 10 mg by mouth at bedtime.   DULoxetine (CYMBALTA) 60 MG capsule Take 60 mg by mouth at bedtime.      ferrous sulfate 325 (65 FE) MG tablet Take 1 tablet (325 mg total) by mouth daily with breakfast.   folic acid (FOLVITE) 1 MG tablet Take 1 mg by mouth at bedtime.    furosemide (LASIX) 20 MG tablet Take 1 tablet (20 mg total) by mouth daily as needed (for weight gain greater than 3 pounds overnight or lower extremity edema).   hydrOXYzine (ATARAX/VISTARIL) 25 MG tablet Take 25 mg by mouth 3 (three) times daily.    isosorbide dinitrate (ISORDIL) 20 MG tablet Take 20 mg by mouth 3 (three) times daily.   levothyroxine (SYNTHROID) 100 MCG tablet Take 100 mcg by mouth daily before breakfast.    Multiple Vitamins-Minerals (MULTIVITAMIN WITH MINERALS) tablet Take 1 tablet by mouth every morning.    nitroGLYCERIN (NITROSTAT) 0.4 MG SL tablet Place 1 tablet (0.4 mg total) under the tongue every 5 (five) minutes as needed for chest pain.   oxybutynin (DITROPAN) 5 MG tablet Take 5 mg by mouth 2 (  two) times daily.   triamcinolone ointment (KENALOG) 0.1 % Apply 1 application topically 2 (two) times daily. Compound 1:1 with Eucerin.     Allergies:   Patient has no known allergies.   Social History   Tobacco Use   Smoking status: Former Smoker    Packs/day: 0.50    Years: 10.00    Pack years: 5.00    Types: Cigarettes    Quit date: 07/24/1983    Years since quitting: 36.1   Smokeless tobacco: Never Used  Substance Use Topics   Alcohol use: No   Drug use: No     Family Hx: The patient's family history includes Cancer in his father and mother; Heart Problems in his brother. There is no history of Anesthesia problems, Hypotension, Malignant hyperthermia, or Pseudochol deficiency.  ROS:   Please see the history of present illness.     All other systems reviewed and are negative.   Prior CV studies:   The following studies were reviewed today:  09/2018 echo Study Conclusions  - Left ventricle: The cavity size was at the upper limits of normal. Wall thickness was normal.  Systolic function was mildly reduced. The estimated ejection fraction was in the range of 45% to 50%. Doppler parameters are consistent with abnormal left ventricular relaxation (grade 1 diastolic dysfunction). Doppler parameters are consistent with high ventricular filling pressure. - Regional wall motion abnormality: Hypokinesis of the apical anterior, mid anteroseptal, and apical inferior myocardium; moderate hypokinesis of the apical septal, apical lateral, and apical myocardium. - Aortic valve: Mildly to moderately calcified annulus. Trileaflet; mildly calcified leaflets. There appears to be a mild degree of calcific aortic stenosis. There was mild regurgitation. Valve area (VTI): 1.76 cm^2. Valve area (Vmax): 1.89 cm^2. - Aorta: Very mild aortic root dilatation. - Mitral valve: There is a ruptured chordae tendinae. There appears to be a small portion that remains attached to the posteriomedial pappillary muscle. There is some chordal calcification. There was mild regurgitation. - Tricuspid valve: There was mild regurgitation.  05/2018 echo Study Conclusions  - Left ventricle: The cavity size was normal. Wall thickness was increased in a pattern of moderate LVH. Systolic function was mildly to moderately reduced. The estimated ejection fraction was = 40%. Features are consistent with a pseudonormal left ventricular filling pattern, with concomitant abnormal relaxation and increased filling pressure (grade 2 diastolic dysfunction). Doppler parameters are consistent with high ventricular filling pressure. - Regional wall motion abnormality: Hypokinesis of the apical septal, apical lateral, and apical myocardium. - Aortic valve: Moderately calcified annulus. Moderately thickened leaflets. There was mild to moderate regurgitation. Valve area (VTI): 2.11 cm^2. Valve area (Vmax): 1.51 cm^2. - Mitral valve: Mildly calcified annulus.  Mildly thickened leaflets . There was mild regurgitation. There isa 5.8 x 8.2 mm echolucent circular structure that is mobile adjacent to the MV subvavular apparatus. This may represent a ruptured calcified portion of the subvalvular apparatus, cannot exclude possible vegetation. Consider TEE to further evaluate. - Left atrium: The atrium was mildly dilated. - Right atrium: The atrium was mildly dilated.  01/2019 cath SUMMARY  Severe multivessel disease: 100% CTO of mid RCA, 50% proximal circumflex with 90% AV groove followed by 100% CTO of LPL (that recanalizes through left left lateral), 90 to 95% bifurcation LAD-2nd Diag followed by 60% and then tandem 99 to 90% lesions in the mid to distal LAD. -- >  Minimal good PCI or CABG options  Moderately elevated LVEDP consistent with acute diastolic heart failure  RECOMMENDATIONS  The patient will be transferred to a telemetry bed.  I plan to restart IV heparin to run for 48 hours for MI.  (Will restart 8 hours post sheath removal)  Continue aggressive medical management.  I discussed the findings with the patient's power of attorney/brother and sister-in-law:  Options going forward would be medical management, CABG, random PCI.  After discussing with the brother: ? It is clear that PCI is not likely a good option as it would not be a good candidate for dual antiplatelet therapy with frequent falls, he is also was anemic.  ? CABG would not be something that they would be interested in offering based on his dementia, renal insufficiency and Parkinson's.  Would not likely recover.   ? Best option going forward would be medical management.   With renal insufficiency, we will hydrate him, but he would likely require diuresis and standing diuretic during this hospitalization.  2D echo was ordered to better assess his EF, however I suspect it to be reduced.  Labs/Other Tests and Data Reviewed:    EKG:  No ECG reviewed.  Recent  Labs: 07/21/2019: B Natriuretic Peptide 258.0; TSH 5.446 07/24/2019: ALT 6; BUN 43; Creatinine, Ser 1.45; Hemoglobin 9.9; Magnesium 2.0; Platelets 173; Potassium 3.8; Sodium 135   Recent Lipid Panel Lab Results  Component Value Date/Time   CHOL 125 02/11/2019 03:03 AM   TRIG 65 02/11/2019 03:03 AM   HDL 26 (L) 02/11/2019 03:03 AM   CHOLHDL 4.8 02/11/2019 03:03 AM   LDLCALC 86 02/11/2019 03:03 AM    Wt Readings from Last 3 Encounters:  08/28/19 180 lb (81.6 kg)  08/23/19 183 lb 13.8 oz (83.4 kg)  07/27/19 186 lb 8.2 oz (84.6 kg)     Objective:    Vital Signs:  Ht 5\' 9"  (1.753 m)    Wt 180 lb (81.6 kg)    BMI 26.58 kg/m    Normal affect. Normal speech pattern and tone. Comfortable no apparent distress.   ASSESSMENT & PLAN:    1.Chronic systolic HF - medical therapy complicated by AKI, hyperkalemia in the past. Also limited due to significant dementia, historically has had trouble adhering to changes.  - no recent symptoms, continue current meds  2. HTN -continue current meds    COVID-19 Education: The signs and symptoms of COVID-19 were discussed with the patient and how to seek care for testing (follow up with PCP or arrange E-visit).  The importance of social distancing was discussed today.  Time:   Today, I have spent 15 minutes with the patient with telehealth technology discussing the above problems.     Medication Adjustments/Labs and Tests Ordered: Current medicines are reviewed at length with the patient today.  Concerns regarding medicines are outlined above.   Tests Ordered: No orders of the defined types were placed in this encounter.   Medication Changes: No orders of the defined types were placed in this encounter.   Follow Up:  Virtual Visit in 4 month(s)  Signed, Carlyle Dolly, MD  08/28/2019 4:36 PM    [facility]

## 2019-08-28 NOTE — Patient Instructions (Addendum)
Medication Instructions: Your physician recommends that you continue on your current medications as directed. Please refer to the Current Medication list given to you today.   Labwork: None today  Procedures/Testing: None today  Follow-Up: 4 months with Dr.Branch  Any Additional Special Instructions Will Be Listed Below (If Applicable).     If you need a refill on your cardiac medications before your next appointment, please call your pharmacy.      Thank you for choosing [facility] !        

## 2019-08-29 ENCOUNTER — Telehealth (HOSPITAL_COMMUNITY): Payer: Self-pay | Admitting: Licensed Clinical Social Worker

## 2019-08-29 DIAGNOSIS — N183 Chronic kidney disease, stage 3 (moderate): Secondary | ICD-10-CM | POA: Diagnosis not present

## 2019-08-29 DIAGNOSIS — Z8673 Personal history of transient ischemic attack (TIA), and cerebral infarction without residual deficits: Secondary | ICD-10-CM | POA: Diagnosis not present

## 2019-08-29 DIAGNOSIS — R5381 Other malaise: Secondary | ICD-10-CM | POA: Diagnosis not present

## 2019-08-29 DIAGNOSIS — W19XXXA Unspecified fall, initial encounter: Secondary | ICD-10-CM | POA: Diagnosis not present

## 2019-08-29 DIAGNOSIS — M6281 Muscle weakness (generalized): Secondary | ICD-10-CM | POA: Diagnosis not present

## 2019-08-29 DIAGNOSIS — I2510 Atherosclerotic heart disease of native coronary artery without angina pectoris: Secondary | ICD-10-CM | POA: Diagnosis not present

## 2019-08-29 DIAGNOSIS — R2689 Other abnormalities of gait and mobility: Secondary | ICD-10-CM | POA: Diagnosis not present

## 2019-08-29 DIAGNOSIS — F0281 Dementia in other diseases classified elsewhere with behavioral disturbance: Secondary | ICD-10-CM | POA: Diagnosis not present

## 2019-08-29 DIAGNOSIS — R296 Repeated falls: Secondary | ICD-10-CM | POA: Diagnosis not present

## 2019-08-29 DIAGNOSIS — R600 Localized edema: Secondary | ICD-10-CM | POA: Diagnosis not present

## 2019-08-29 DIAGNOSIS — I5042 Chronic combined systolic (congestive) and diastolic (congestive) heart failure: Secondary | ICD-10-CM | POA: Diagnosis not present

## 2019-08-29 DIAGNOSIS — T1490XA Injury, unspecified, initial encounter: Secondary | ICD-10-CM | POA: Diagnosis not present

## 2019-08-29 DIAGNOSIS — R41841 Cognitive communication deficit: Secondary | ICD-10-CM | POA: Diagnosis not present

## 2019-08-29 DIAGNOSIS — I130 Hypertensive heart and chronic kidney disease with heart failure and stage 1 through stage 4 chronic kidney disease, or unspecified chronic kidney disease: Secondary | ICD-10-CM | POA: Diagnosis not present

## 2019-08-29 DIAGNOSIS — I739 Peripheral vascular disease, unspecified: Secondary | ICD-10-CM | POA: Diagnosis not present

## 2019-08-29 NOTE — Telephone Encounter (Signed)
CSW consulted to help get BP monitor and scale for patient so he can monitor at home.  CSW called and spoke with pt wife who confirms they don't have either device and are agreeable to having it sent to their home.  Pt wife confirmed address and chart and CSW ordered cuff and scale- anticipated to arrive on Friday.  CSW will continue to follow and assist as needed  Jorge Ny, [facility] Desk#: [phone] Cell#: [phone]

## 2019-09-01 DIAGNOSIS — M25522 Pain in left elbow: Secondary | ICD-10-CM | POA: Diagnosis not present

## 2019-09-01 DIAGNOSIS — M79602 Pain in left arm: Secondary | ICD-10-CM | POA: Diagnosis not present

## 2019-09-01 DIAGNOSIS — M25512 Pain in left shoulder: Secondary | ICD-10-CM | POA: Diagnosis not present

## 2019-09-04 DIAGNOSIS — R2689 Other abnormalities of gait and mobility: Secondary | ICD-10-CM | POA: Diagnosis not present

## 2019-09-04 DIAGNOSIS — F0281 Dementia in other diseases classified elsewhere with behavioral disturbance: Secondary | ICD-10-CM | POA: Diagnosis not present

## 2019-09-04 DIAGNOSIS — N183 Chronic kidney disease, stage 3 (moderate): Secondary | ICD-10-CM | POA: Diagnosis not present

## 2019-09-04 DIAGNOSIS — M6281 Muscle weakness (generalized): Secondary | ICD-10-CM | POA: Diagnosis not present

## 2019-09-04 DIAGNOSIS — I5042 Chronic combined systolic (congestive) and diastolic (congestive) heart failure: Secondary | ICD-10-CM | POA: Diagnosis not present

## 2019-09-04 DIAGNOSIS — R296 Repeated falls: Secondary | ICD-10-CM | POA: Diagnosis not present

## 2019-09-04 DIAGNOSIS — I2510 Atherosclerotic heart disease of native coronary artery without angina pectoris: Secondary | ICD-10-CM | POA: Diagnosis not present

## 2019-09-04 DIAGNOSIS — R600 Localized edema: Secondary | ICD-10-CM | POA: Diagnosis not present

## 2019-09-04 DIAGNOSIS — I739 Peripheral vascular disease, unspecified: Secondary | ICD-10-CM | POA: Diagnosis not present

## 2019-09-04 DIAGNOSIS — I130 Hypertensive heart and chronic kidney disease with heart failure and stage 1 through stage 4 chronic kidney disease, or unspecified chronic kidney disease: Secondary | ICD-10-CM | POA: Diagnosis not present

## 2019-09-05 DIAGNOSIS — I130 Hypertensive heart and chronic kidney disease with heart failure and stage 1 through stage 4 chronic kidney disease, or unspecified chronic kidney disease: Secondary | ICD-10-CM | POA: Diagnosis not present

## 2019-09-05 DIAGNOSIS — R600 Localized edema: Secondary | ICD-10-CM | POA: Diagnosis not present

## 2019-09-05 DIAGNOSIS — I2510 Atherosclerotic heart disease of native coronary artery without angina pectoris: Secondary | ICD-10-CM | POA: Diagnosis not present

## 2019-09-05 DIAGNOSIS — I5042 Chronic combined systolic (congestive) and diastolic (congestive) heart failure: Secondary | ICD-10-CM | POA: Diagnosis not present

## 2019-09-05 DIAGNOSIS — R296 Repeated falls: Secondary | ICD-10-CM | POA: Diagnosis not present

## 2019-09-05 DIAGNOSIS — F0281 Dementia in other diseases classified elsewhere with behavioral disturbance: Secondary | ICD-10-CM | POA: Diagnosis not present

## 2019-09-05 DIAGNOSIS — I739 Peripheral vascular disease, unspecified: Secondary | ICD-10-CM | POA: Diagnosis not present

## 2019-09-05 DIAGNOSIS — N183 Chronic kidney disease, stage 3 (moderate): Secondary | ICD-10-CM | POA: Diagnosis not present

## 2019-09-05 DIAGNOSIS — R2689 Other abnormalities of gait and mobility: Secondary | ICD-10-CM | POA: Diagnosis not present

## 2019-09-05 DIAGNOSIS — M6281 Muscle weakness (generalized): Secondary | ICD-10-CM | POA: Diagnosis not present

## 2019-09-06 DIAGNOSIS — R2689 Other abnormalities of gait and mobility: Secondary | ICD-10-CM | POA: Diagnosis not present

## 2019-09-06 DIAGNOSIS — I739 Peripheral vascular disease, unspecified: Secondary | ICD-10-CM | POA: Diagnosis not present

## 2019-09-06 DIAGNOSIS — I2510 Atherosclerotic heart disease of native coronary artery without angina pectoris: Secondary | ICD-10-CM | POA: Diagnosis not present

## 2019-09-06 DIAGNOSIS — I130 Hypertensive heart and chronic kidney disease with heart failure and stage 1 through stage 4 chronic kidney disease, or unspecified chronic kidney disease: Secondary | ICD-10-CM | POA: Diagnosis not present

## 2019-09-06 DIAGNOSIS — R600 Localized edema: Secondary | ICD-10-CM | POA: Diagnosis not present

## 2019-09-06 DIAGNOSIS — F0281 Dementia in other diseases classified elsewhere with behavioral disturbance: Secondary | ICD-10-CM | POA: Diagnosis not present

## 2019-09-06 DIAGNOSIS — R296 Repeated falls: Secondary | ICD-10-CM | POA: Diagnosis not present

## 2019-09-06 DIAGNOSIS — M6281 Muscle weakness (generalized): Secondary | ICD-10-CM | POA: Diagnosis not present

## 2019-09-06 DIAGNOSIS — I5042 Chronic combined systolic (congestive) and diastolic (congestive) heart failure: Secondary | ICD-10-CM | POA: Diagnosis not present

## 2019-09-06 DIAGNOSIS — N183 Chronic kidney disease, stage 3 (moderate): Secondary | ICD-10-CM | POA: Diagnosis not present

## 2019-09-14 DIAGNOSIS — F0281 Dementia in other diseases classified elsewhere with behavioral disturbance: Secondary | ICD-10-CM | POA: Diagnosis not present

## 2019-09-14 DIAGNOSIS — R296 Repeated falls: Secondary | ICD-10-CM | POA: Diagnosis not present

## 2019-09-14 DIAGNOSIS — I130 Hypertensive heart and chronic kidney disease with heart failure and stage 1 through stage 4 chronic kidney disease, or unspecified chronic kidney disease: Secondary | ICD-10-CM | POA: Diagnosis not present

## 2019-09-14 DIAGNOSIS — I5042 Chronic combined systolic (congestive) and diastolic (congestive) heart failure: Secondary | ICD-10-CM | POA: Diagnosis not present

## 2019-09-14 DIAGNOSIS — I739 Peripheral vascular disease, unspecified: Secondary | ICD-10-CM | POA: Diagnosis not present

## 2019-09-14 DIAGNOSIS — I2510 Atherosclerotic heart disease of native coronary artery without angina pectoris: Secondary | ICD-10-CM | POA: Diagnosis not present

## 2019-09-14 DIAGNOSIS — M6281 Muscle weakness (generalized): Secondary | ICD-10-CM | POA: Diagnosis not present

## 2019-09-14 DIAGNOSIS — R2689 Other abnormalities of gait and mobility: Secondary | ICD-10-CM | POA: Diagnosis not present

## 2019-09-14 DIAGNOSIS — R600 Localized edema: Secondary | ICD-10-CM | POA: Diagnosis not present

## 2019-09-16 DIAGNOSIS — Z9181 History of falling: Secondary | ICD-10-CM | POA: Diagnosis not present

## 2019-09-16 DIAGNOSIS — I5043 Acute on chronic combined systolic (congestive) and diastolic (congestive) heart failure: Secondary | ICD-10-CM | POA: Diagnosis not present

## 2019-09-19 DIAGNOSIS — I5042 Chronic combined systolic (congestive) and diastolic (congestive) heart failure: Secondary | ICD-10-CM | POA: Diagnosis not present

## 2019-09-19 DIAGNOSIS — Z8673 Personal history of transient ischemic attack (TIA), and cerebral infarction without residual deficits: Secondary | ICD-10-CM | POA: Diagnosis not present

## 2019-09-19 DIAGNOSIS — F0281 Dementia in other diseases classified elsewhere with behavioral disturbance: Secondary | ICD-10-CM | POA: Diagnosis not present

## 2019-09-19 DIAGNOSIS — I130 Hypertensive heart and chronic kidney disease with heart failure and stage 1 through stage 4 chronic kidney disease, or unspecified chronic kidney disease: Secondary | ICD-10-CM | POA: Diagnosis not present

## 2019-09-19 DIAGNOSIS — M6281 Muscle weakness (generalized): Secondary | ICD-10-CM | POA: Diagnosis not present

## 2019-09-19 DIAGNOSIS — R296 Repeated falls: Secondary | ICD-10-CM | POA: Diagnosis not present

## 2019-09-19 DIAGNOSIS — R41841 Cognitive communication deficit: Secondary | ICD-10-CM | POA: Diagnosis not present

## 2019-09-19 DIAGNOSIS — R600 Localized edema: Secondary | ICD-10-CM | POA: Diagnosis not present

## 2019-09-19 DIAGNOSIS — R2689 Other abnormalities of gait and mobility: Secondary | ICD-10-CM | POA: Diagnosis not present

## 2019-09-19 DIAGNOSIS — I739 Peripheral vascular disease, unspecified: Secondary | ICD-10-CM | POA: Diagnosis not present

## 2019-09-19 DIAGNOSIS — I2510 Atherosclerotic heart disease of native coronary artery without angina pectoris: Secondary | ICD-10-CM | POA: Diagnosis not present

## 2019-10-04 ENCOUNTER — Ambulatory Visit: Payer: PPO | Admitting: Allergy & Immunology

## 2019-10-11 DIAGNOSIS — H9012 Conductive hearing loss, unilateral, left ear, with unrestricted hearing on the contralateral side: Secondary | ICD-10-CM | POA: Diagnosis not present

## 2019-10-11 DIAGNOSIS — H6123 Impacted cerumen, bilateral: Secondary | ICD-10-CM | POA: Diagnosis not present

## 2019-10-11 DIAGNOSIS — G4709 Other insomnia: Secondary | ICD-10-CM | POA: Diagnosis not present

## 2019-10-18 DIAGNOSIS — M6281 Muscle weakness (generalized): Secondary | ICD-10-CM | POA: Diagnosis not present

## 2019-10-18 DIAGNOSIS — F0281 Dementia in other diseases classified elsewhere with behavioral disturbance: Secondary | ICD-10-CM | POA: Diagnosis not present

## 2019-10-18 DIAGNOSIS — Z8673 Personal history of transient ischemic attack (TIA), and cerebral infarction without residual deficits: Secondary | ICD-10-CM | POA: Diagnosis not present

## 2019-10-18 DIAGNOSIS — I2510 Atherosclerotic heart disease of native coronary artery without angina pectoris: Secondary | ICD-10-CM | POA: Diagnosis not present

## 2019-10-18 DIAGNOSIS — R600 Localized edema: Secondary | ICD-10-CM | POA: Diagnosis not present

## 2019-10-18 DIAGNOSIS — I5042 Chronic combined systolic (congestive) and diastolic (congestive) heart failure: Secondary | ICD-10-CM | POA: Diagnosis not present

## 2019-10-18 DIAGNOSIS — R41841 Cognitive communication deficit: Secondary | ICD-10-CM | POA: Diagnosis not present

## 2019-10-18 DIAGNOSIS — R296 Repeated falls: Secondary | ICD-10-CM | POA: Diagnosis not present

## 2019-10-18 DIAGNOSIS — Z9181 History of falling: Secondary | ICD-10-CM | POA: Diagnosis not present

## 2019-10-18 DIAGNOSIS — I130 Hypertensive heart and chronic kidney disease with heart failure and stage 1 through stage 4 chronic kidney disease, or unspecified chronic kidney disease: Secondary | ICD-10-CM | POA: Diagnosis not present

## 2019-10-18 DIAGNOSIS — I5043 Acute on chronic combined systolic (congestive) and diastolic (congestive) heart failure: Secondary | ICD-10-CM | POA: Diagnosis not present

## 2019-10-18 DIAGNOSIS — R2689 Other abnormalities of gait and mobility: Secondary | ICD-10-CM | POA: Diagnosis not present

## 2019-10-18 DIAGNOSIS — I739 Peripheral vascular disease, unspecified: Secondary | ICD-10-CM | POA: Diagnosis not present

## 2019-11-01 DIAGNOSIS — E782 Mixed hyperlipidemia: Secondary | ICD-10-CM | POA: Diagnosis not present

## 2019-11-01 DIAGNOSIS — D500 Iron deficiency anemia secondary to blood loss (chronic): Secondary | ICD-10-CM | POA: Diagnosis not present

## 2019-11-01 DIAGNOSIS — D509 Iron deficiency anemia, unspecified: Secondary | ICD-10-CM | POA: Diagnosis not present

## 2019-11-01 DIAGNOSIS — E039 Hypothyroidism, unspecified: Secondary | ICD-10-CM | POA: Diagnosis not present

## 2019-11-01 DIAGNOSIS — I10 Essential (primary) hypertension: Secondary | ICD-10-CM | POA: Diagnosis not present

## 2019-11-08 DIAGNOSIS — Z23 Encounter for immunization: Secondary | ICD-10-CM | POA: Diagnosis not present

## 2019-11-08 DIAGNOSIS — I5043 Acute on chronic combined systolic (congestive) and diastolic (congestive) heart failure: Secondary | ICD-10-CM | POA: Diagnosis not present

## 2019-11-08 DIAGNOSIS — N1832 Chronic kidney disease, stage 3b: Secondary | ICD-10-CM | POA: Diagnosis not present

## 2019-11-08 DIAGNOSIS — I2510 Atherosclerotic heart disease of native coronary artery without angina pectoris: Secondary | ICD-10-CM | POA: Diagnosis not present

## 2019-11-08 DIAGNOSIS — R32 Unspecified urinary incontinence: Secondary | ICD-10-CM | POA: Diagnosis not present

## 2019-11-08 DIAGNOSIS — Z0001 Encounter for general adult medical examination with abnormal findings: Secondary | ICD-10-CM | POA: Diagnosis not present

## 2019-11-08 DIAGNOSIS — G214 Vascular parkinsonism: Secondary | ICD-10-CM | POA: Diagnosis not present

## 2019-11-08 DIAGNOSIS — E782 Mixed hyperlipidemia: Secondary | ICD-10-CM | POA: Diagnosis not present

## 2019-11-08 DIAGNOSIS — I712 Thoracic aortic aneurysm, without rupture: Secondary | ICD-10-CM | POA: Diagnosis not present

## 2019-11-08 DIAGNOSIS — F0151 Vascular dementia with behavioral disturbance: Secondary | ICD-10-CM | POA: Diagnosis not present

## 2019-11-08 DIAGNOSIS — D509 Iron deficiency anemia, unspecified: Secondary | ICD-10-CM | POA: Diagnosis not present

## 2019-11-15 DIAGNOSIS — W19XXXA Unspecified fall, initial encounter: Secondary | ICD-10-CM | POA: Diagnosis not present

## 2019-11-15 DIAGNOSIS — Z743 Need for continuous supervision: Secondary | ICD-10-CM | POA: Diagnosis not present

## 2019-11-15 DIAGNOSIS — R4182 Altered mental status, unspecified: Secondary | ICD-10-CM | POA: Diagnosis not present

## 2019-11-22 DIAGNOSIS — I712 Thoracic aortic aneurysm, without rupture: Secondary | ICD-10-CM | POA: Diagnosis not present

## 2019-11-22 DIAGNOSIS — G214 Vascular parkinsonism: Secondary | ICD-10-CM | POA: Diagnosis not present

## 2019-11-22 DIAGNOSIS — R32 Unspecified urinary incontinence: Secondary | ICD-10-CM | POA: Diagnosis not present

## 2019-11-22 DIAGNOSIS — Z23 Encounter for immunization: Secondary | ICD-10-CM | POA: Diagnosis not present

## 2019-11-22 DIAGNOSIS — E782 Mixed hyperlipidemia: Secondary | ICD-10-CM | POA: Diagnosis not present

## 2019-11-22 DIAGNOSIS — I5043 Acute on chronic combined systolic (congestive) and diastolic (congestive) heart failure: Secondary | ICD-10-CM | POA: Diagnosis not present

## 2019-11-22 DIAGNOSIS — D509 Iron deficiency anemia, unspecified: Secondary | ICD-10-CM | POA: Diagnosis not present

## 2019-11-22 DIAGNOSIS — N1832 Chronic kidney disease, stage 3b: Secondary | ICD-10-CM | POA: Diagnosis not present

## 2019-11-22 DIAGNOSIS — I2510 Atherosclerotic heart disease of native coronary artery without angina pectoris: Secondary | ICD-10-CM | POA: Diagnosis not present

## 2019-11-22 DIAGNOSIS — F0151 Vascular dementia with behavioral disturbance: Secondary | ICD-10-CM | POA: Diagnosis not present

## 2019-12-22 ENCOUNTER — Telehealth: Payer: PPO | Admitting: Cardiology

## 2019-12-22 NOTE — Progress Notes (Unsigned)
{Choose 1 Note Type (Telehealth Visit or Telephone Visit):[phone]}   Date:  12/22/2019   ID:  [name], DOB Nov 17, 1941, MRN [id number]  {Patient Location:[phone]::"Home"} {Provider [phone]::"Home"}  PCP:  Celene Squibb, MD  Cardiologist:  Carlyle Dolly, MD *** Electrophysiologist:  None   Evaluation Performed:  {Choose Visit Type:[phone]::"Follow-Up Visit"}  Chief Complaint:  ***  History of Present Illness:    [name] is a 79 y.o. male seen today for follow up of the following medical problems.   1. Chronic systolic HF/CAD/ICM - admission 05/2018 with volume overload.  - 6/2019Echo LVEF [id number] II diastolic dyscuntion, apical hypokinesis,density attached to MV subvalvular apparatus - ischemic testing not pursued due to AKI at the time and dementia     01/2019 cath: severe 3 vessel disease as reported below. Poor targets for revasc and advanced comorbidities including dementa and CKD recs for medical therapy.  - 02/2019 echo: echo LVEF 99991111, grade I diastolic dysfunction   - medical therapy limited by prior hyperkalemia and AKI. Aldactone stopped, losartan stopped, KCl stopped, and lasix decreased    02/2019 echo: echo LVEF 99991111, grade I diastolic ydsfunction.   05/2019 pcp stopped bididl due to rash, started isordil by itself - family denies any recent edema. Takes lasix just prn.  - no recent chest pain   2. MV density - 05/2018 TEE ruptured chordae tendinae  3.Recent admission - admit 07/2019 with AKI due to dehydration and diarrhea - admission complicated by cdiff   4. Parkinsons disease with dementia -   5. Rash - ongoing workup by allergist   The patient {does/does not:200015} have symptoms concerning for COVID-19 infection (fever, chills, cough, or new shortness of breath).    Past Medical History:  Diagnosis Date  . Anxiety   . CAD (coronary artery disease)    a. 01/2019: cath showing severe  multivessel disease with 100% CTO of mid RCA, 50% proximal circumflex with 90% AV groove followed by 100% CTO of LPL (that recanalizes through left left lateral), 90 to 95% bifurcation LAD-2nd Diag followed by 60% and then tandem 99 to 90% lesions in the mid to distal LAD. Not candidate for CABG or PCI. Med management recommended.   . Cardiomyopathy ([place])   . Chronic combined systolic and diastolic CHF (congestive heart failure) ([place])   . Dementia ([place])   . GERD (gastroesophageal reflux disease)   . Hyperlipidemia   . Hypertension   . Incontinence   . 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  . Myocardial infarction ([place])   . Neuromuscular disorder ([place])   . Parkinson's disease ([place])   . Peripheral vascular disease [facility])    Past Surgical History:  Procedure Laterality Date  . BLADDER REPAIR     pt sts "when I had my prostatectomy they cut too much and I have an internal button I have to press in order to release my urine"  . BLADDER SURGERY     2010  pump placed   . CATARACT EXTRACTION W/PHACO  07/28/2011   Procedure: CATARACT EXTRACTION PHACO AND INTRAOCULAR LENS PLACEMENT (IOC);  Surgeon: Elta Guadeloupe T. Gershon Crane;  [facility]: [facility];  Service: Ophthalmology;  Laterality: Right;  CDE: 20.26  . CATARACT EXTRACTION W/PHACO  09/08/2011   Procedure: CATARACT EXTRACTION PHACO AND INTRAOCULAR LENS PLACEMENT (IOC);  Surgeon: Elta Guadeloupe T. Gershon Crane;  [facility]: [facility];  Service: Ophthalmology;  Laterality: Left;  CDE:37.31  . ENDARTERECTOMY Right 10/07/2016   Procedure: RIGHT ENDARTERECTOMY CAROTID WITH LIGATION OF  INTERNAL CAROTID;  Surgeon: Serafina Mitchell, MD;  Location: [place];  Service: Vascular;  Laterality: Right;  . LEFT HEART CATH AND CORONARY ANGIOGRAPHY N/A 02/10/2019   Procedure: LEFT HEART CATH AND CORONARY ANGIOGRAPHY;  Surgeon: Leonie Man, MD;  Location: [place] CV LAB;  Service: Cardiovascular;  Laterality: N/A;  . PATCH ANGIOPLASTY Right 10/07/2016   Procedure: PATCH  ANGIOPLASTY USING Rueben Bash BIOLOGIC PATCH;  Surgeon: Serafina Mitchell, MD;  Location: [place];  Service: Vascular;  Laterality: Right;  . PROSTATECTOMY    . TEE WITHOUT CARDIOVERSION N/A 06/10/2018   Procedure: TRANSESOPHAGEAL ECHOCARDIOGRAM (TEE);  Surgeon: Arnoldo Lenis, MD;  [facility];  Service: Endoscopy;  Laterality: N/A;  . YAG LASER APPLICATION  [id number]   Procedure: YAG LASER APPLICATION;  Surgeon: Elta Guadeloupe T. Gershon Crane, MD;  Location: [facility];  Service: Ophthalmology;  Laterality: Right;     No outpatient medications have been marked as taking for the 12/22/19 encounter (Appointment) with Arnoldo Lenis, MD.     Allergies:   Patient has no known allergies.   Social History   Tobacco Use  . Smoking status: Former Smoker    Packs/day: 0.50    Years: 10.00    Pack years: 5.00    Types: Cigarettes    Quit date: 07/24/1983    Years since quitting: 36.4  . Smokeless tobacco: Never Used  Substance Use Topics  . Alcohol use: No  . Drug use: No     Family Hx: The patient's family history includes Cancer in his father and mother; Heart Problems in his brother. There is no history of Anesthesia problems, Hypotension, Malignant hyperthermia, or Pseudochol deficiency.  ROS:   Please see the history of present illness.    *** All other systems reviewed and are negative.   Prior CV studies:   The following studies were reviewed today:  09/2018 echo Study Conclusions  - Left ventricle: The cavity size was at the upper limits of normal. Wall thickness was normal. Systolic function was mildly reduced. The estimated ejection fraction was in the range of 45% to 50%. Doppler parameters are consistent with abnormal left ventricular relaxation (grade 1 diastolic dysfunction). Doppler parameters are consistent with high ventricular filling pressure. - Regional wall motion abnormality: Hypokinesis of the apical anterior, mid anteroseptal, and apical inferior  myocardium; moderate hypokinesis of the apical septal, apical lateral, and apical myocardium. - Aortic valve: Mildly to moderately calcified annulus. Trileaflet; mildly calcified leaflets. There appears to be a mild degree of calcific aortic stenosis. There was mild regurgitation. Valve area (VTI): 1.76 cm^2. Valve area (Vmax): 1.89 cm^2. - Aorta: Very mild aortic root dilatation. - Mitral valve: There is a ruptured chordae tendinae. There appears to be a small portion that remains attached to the posteriomedial pappillary muscle. There is some chordal calcification. There was mild regurgitation. - Tricuspid valve: There was mild regurgitation.  05/2018 echo Study Conclusions  - Left ventricle: The cavity size was normal. Wall thickness was increased in a pattern of moderate LVH. Systolic function was mildly to moderately reduced. The estimated ejection fraction was = 40%. Features are consistent with a pseudonormal left ventricular filling pattern, with concomitant abnormal relaxation and increased filling pressure (grade 2 diastolic dysfunction). Doppler parameters are consistent with high ventricular filling pressure. - Regional wall motion abnormality: Hypokinesis of the apical septal, apical lateral, and apical myocardium. - Aortic valve: Moderately calcified annulus. Moderately thickened leaflets. There was mild to moderate regurgitation. Valve area (VTI): 2.11  cm^2. Valve area (Vmax): 1.51 cm^2. - Mitral valve: Mildly calcified annulus. Mildly thickened leaflets . There was mild regurgitation. There isa 5.8 x 8.2 mm echolucent circular structure that is mobile adjacent to the MV subvavular apparatus. This may represent a ruptured calcified portion of the subvalvular apparatus, cannot exclude possible vegetation. Consider TEE to further evaluate. - Left atrium: The atrium was mildly dilated. - Right atrium: The atrium was  mildly dilated.  01/2019 cath SUMMARY  Severe multivessel disease: 100% CTO of mid RCA, 50% proximal circumflex with 90% AV groove followed by 100% CTO of LPL (that recanalizes through left left lateral), 90 to 95% bifurcation LAD-2nd Diag followed by 60% and then tandem 99 to 90% lesions in the mid to distal LAD. -- >Minimal good PCI or CABG options  Moderately elevated LVEDP consistent with acute diastolic heart failure  RECOMMENDATIONS  The patient will be transferred to a telemetry bed. I plan to restart IV heparin to run for 48 hours for MI. (Will restart 8 hours post sheath removal)  Continue aggressive medical management.  I discussed the findings with the patient's power of attorney/brother and sister-in-law: Options going forward would be medical management, CABG, random PCI. After discussing with the brother: ? It is clear that PCI is not likely a good option as it would not be a good candidate for dual antiplatelet therapy with frequent falls, he is also was anemic.  ? CABG would not be something that they would be interested in offering based on his dementia, renal insufficiency and Parkinson's. Would not likely recover.  ? Best option going forward would be medical management.   With renal insufficiency, we will hydrate him, but he would likely require diuresis and standing diuretic during this hospitalization.  2D echo was ordered to better assess his EF, however I suspect it to be reduced.  Labs/Other Tests and Data Reviewed:    EKG:  {EKG/Telemetry Strips Reviewed:[id number]}  Recent Labs: 07/21/2019: B Natriuretic Peptide 258.0; TSH 5.446 07/24/2019: ALT 6; BUN 43; Creatinine, Ser 1.45; Hemoglobin 9.9; Magnesium 2.0; Platelets 173; Potassium 3.8; Sodium 135   Recent Lipid Panel Lab Results  Component Value Date/Time   CHOL 125 02/11/2019 03:03 AM   TRIG 65 02/11/2019 03:03 AM   HDL 26 (L) 02/11/2019 03:03 AM   CHOLHDL 4.8 02/11/2019 03:03 AM   LDLCALC  86 02/11/2019 03:03 AM    Wt Readings from Last 3 Encounters:  08/28/19 180 lb (81.6 kg)  08/23/19 183 lb 13.8 oz (83.4 kg)  07/27/19 186 lb 8.2 oz (84.6 kg)     Objective:    Vital Signs:  There were no vitals taken for this visit.   {[facility] Virtual Exam (Optional):[phone]::"VITAL SIGNS:  reviewed"}  ASSESSMENT & PLAN:    1.Chronic systolic HF - medical therapy complicated by AKI, hyperkalemia in the past. Also limited due to significant dementia, historically has had trouble adhering to changes.  - no recent symptoms, continue current meds  2. HTN -continue current meds  COVID-19 Education: The signs and symptoms of COVID-19 were discussed with the patient and how to seek care for testing (follow up with PCP or arrange E-visit).  ***The importance of social distancing was discussed today.  Time:   Today, I have spent *** minutes with the patient with telehealth technology discussing the above problems.     Medication Adjustments/Labs and Tests Ordered: Current medicines are reviewed at length with the patient today.  Concerns regarding medicines are outlined above.   Tests Ordered:  No orders of the defined types were placed in this encounter.   Medication Changes: No orders of the defined types were placed in this encounter.   Follow Up:  {F/U Format:[phone]} {follow up:15908}  Signed, Carlyle Dolly, MD  12/22/2019 7:34 AM    [facility]

## 2020-01-05 ENCOUNTER — Ambulatory Visit: Payer: PPO | Admitting: Urology

## 2020-01-16 ENCOUNTER — Other Ambulatory Visit: Payer: Self-pay

## 2020-01-16 ENCOUNTER — Encounter (HOSPITAL_COMMUNITY): Payer: Self-pay | Admitting: Emergency Medicine

## 2020-01-16 ENCOUNTER — Encounter (HOSPITAL_COMMUNITY)
Admission: EM | Disposition: A | Discharge status: Hospice | Payer: Self-pay | Source: Home / Self Care | Attending: Cardiology

## 2020-01-16 ENCOUNTER — Inpatient Hospital Stay (HOSPITAL_COMMUNITY)
Admission: EM | Admit: 2020-01-16 | Discharge: 2020-01-25 | DRG: 280 | Disposition: A | Discharge status: Hospice | Payer: PPO | Attending: Cardiology | Admitting: Cardiology

## 2020-01-16 ENCOUNTER — Ambulatory Visit (HOSPITAL_COMMUNITY): Admission: RE | Admit: 2020-01-16 | Payer: PPO | Source: Home / Self Care | Admitting: Cardiology

## 2020-01-16 ENCOUNTER — Emergency Department (HOSPITAL_COMMUNITY): Payer: PPO

## 2020-01-16 DIAGNOSIS — M25532 Pain in left wrist: Secondary | ICD-10-CM | POA: Diagnosis not present

## 2020-01-16 DIAGNOSIS — I2119 ST elevation (STEMI) myocardial infarction involving other coronary artery of inferior wall: Secondary | ICD-10-CM | POA: Diagnosis present

## 2020-01-16 DIAGNOSIS — I2510 Atherosclerotic heart disease of native coronary artery without angina pectoris: Secondary | ICD-10-CM | POA: Diagnosis present

## 2020-01-16 DIAGNOSIS — R7303 Prediabetes: Secondary | ICD-10-CM | POA: Diagnosis not present

## 2020-01-16 DIAGNOSIS — R0789 Other chest pain: Secondary | ICD-10-CM | POA: Diagnosis not present

## 2020-01-16 DIAGNOSIS — K219 Gastro-esophageal reflux disease without esophagitis: Secondary | ICD-10-CM | POA: Diagnosis present

## 2020-01-16 DIAGNOSIS — Z599 Problem related to housing and economic circumstances, unspecified: Secondary | ICD-10-CM

## 2020-01-16 DIAGNOSIS — E039 Hypothyroidism, unspecified: Secondary | ICD-10-CM | POA: Diagnosis not present

## 2020-01-16 DIAGNOSIS — E782 Mixed hyperlipidemia: Secondary | ICD-10-CM | POA: Diagnosis not present

## 2020-01-16 DIAGNOSIS — I255 Ischemic cardiomyopathy: Secondary | ICD-10-CM

## 2020-01-16 DIAGNOSIS — M79602 Pain in left arm: Secondary | ICD-10-CM | POA: Diagnosis not present

## 2020-01-16 DIAGNOSIS — R32 Unspecified urinary incontinence: Secondary | ICD-10-CM | POA: Diagnosis present

## 2020-01-16 DIAGNOSIS — I351 Nonrheumatic aortic (valve) insufficiency: Secondary | ICD-10-CM | POA: Diagnosis not present

## 2020-01-16 DIAGNOSIS — N1831 Chronic kidney disease, stage 3a: Secondary | ICD-10-CM | POA: Diagnosis present

## 2020-01-16 DIAGNOSIS — I712 Thoracic aortic aneurysm, without rupture: Secondary | ICD-10-CM | POA: Diagnosis present

## 2020-01-16 DIAGNOSIS — I739 Peripheral vascular disease, unspecified: Secondary | ICD-10-CM | POA: Diagnosis not present

## 2020-01-16 DIAGNOSIS — Z79890 Hormone replacement therapy (postmenopausal): Secondary | ICD-10-CM

## 2020-01-16 DIAGNOSIS — G20 Parkinson's disease: Secondary | ICD-10-CM | POA: Diagnosis present

## 2020-01-16 DIAGNOSIS — Z515 Encounter for palliative care: Secondary | ICD-10-CM | POA: Diagnosis not present

## 2020-01-16 DIAGNOSIS — Z961 Presence of intraocular lens: Secondary | ICD-10-CM | POA: Diagnosis present

## 2020-01-16 DIAGNOSIS — M79603 Pain in arm, unspecified: Secondary | ICD-10-CM

## 2020-01-16 DIAGNOSIS — E785 Hyperlipidemia, unspecified: Secondary | ICD-10-CM | POA: Diagnosis not present

## 2020-01-16 DIAGNOSIS — M25512 Pain in left shoulder: Secondary | ICD-10-CM | POA: Diagnosis not present

## 2020-01-16 DIAGNOSIS — G4700 Insomnia, unspecified: Secondary | ICD-10-CM | POA: Diagnosis present

## 2020-01-16 DIAGNOSIS — Z66 Do not resuscitate: Secondary | ICD-10-CM | POA: Diagnosis not present

## 2020-01-16 DIAGNOSIS — I252 Old myocardial infarction: Secondary | ICD-10-CM | POA: Diagnosis not present

## 2020-01-16 DIAGNOSIS — I5043 Acute on chronic combined systolic (congestive) and diastolic (congestive) heart failure: Secondary | ICD-10-CM | POA: Diagnosis not present

## 2020-01-16 DIAGNOSIS — F0281 Dementia in other diseases classified elsewhere with behavioral disturbance: Secondary | ICD-10-CM | POA: Diagnosis not present

## 2020-01-16 DIAGNOSIS — I130 Hypertensive heart and chronic kidney disease with heart failure and stage 1 through stage 4 chronic kidney disease, or unspecified chronic kidney disease: Secondary | ICD-10-CM | POA: Diagnosis present

## 2020-01-16 DIAGNOSIS — M79673 Pain in unspecified foot: Secondary | ICD-10-CM

## 2020-01-16 DIAGNOSIS — I10 Essential (primary) hypertension: Secondary | ICD-10-CM | POA: Diagnosis present

## 2020-01-16 DIAGNOSIS — E038 Other specified hypothyroidism: Secondary | ICD-10-CM | POA: Diagnosis not present

## 2020-01-16 DIAGNOSIS — Z743 Need for continuous supervision: Secondary | ICD-10-CM | POA: Diagnosis not present

## 2020-01-16 DIAGNOSIS — R079 Chest pain, unspecified: Secondary | ICD-10-CM | POA: Diagnosis not present

## 2020-01-16 DIAGNOSIS — Z7189 Other specified counseling: Secondary | ICD-10-CM | POA: Diagnosis not present

## 2020-01-16 DIAGNOSIS — I2109 ST elevation (STEMI) myocardial infarction involving other coronary artery of anterior wall: Principal | ICD-10-CM | POA: Diagnosis present

## 2020-01-16 DIAGNOSIS — I959 Hypotension, unspecified: Secondary | ICD-10-CM | POA: Diagnosis not present

## 2020-01-16 DIAGNOSIS — R0902 Hypoxemia: Secondary | ICD-10-CM | POA: Diagnosis not present

## 2020-01-16 DIAGNOSIS — Z9841 Cataract extraction status, right eye: Secondary | ICD-10-CM

## 2020-01-16 DIAGNOSIS — Z20822 Contact with and (suspected) exposure to covid-19: Secondary | ICD-10-CM | POA: Diagnosis not present

## 2020-01-16 DIAGNOSIS — F419 Anxiety disorder, unspecified: Secondary | ICD-10-CM | POA: Diagnosis present

## 2020-01-16 DIAGNOSIS — M19022 Primary osteoarthritis, left elbow: Secondary | ICD-10-CM | POA: Diagnosis not present

## 2020-01-16 DIAGNOSIS — G709 Myoneural disorder, unspecified: Secondary | ICD-10-CM | POA: Diagnosis present

## 2020-01-16 DIAGNOSIS — Z87891 Personal history of nicotine dependence: Secondary | ICD-10-CM

## 2020-01-16 DIAGNOSIS — I200 Unstable angina: Secondary | ICD-10-CM | POA: Insufficient documentation

## 2020-01-16 DIAGNOSIS — I429 Cardiomyopathy, unspecified: Secondary | ICD-10-CM

## 2020-01-16 DIAGNOSIS — M19072 Primary osteoarthritis, left ankle and foot: Secondary | ICD-10-CM | POA: Diagnosis not present

## 2020-01-16 DIAGNOSIS — I25110 Atherosclerotic heart disease of native coronary artery with unstable angina pectoris: Secondary | ICD-10-CM | POA: Diagnosis not present

## 2020-01-16 DIAGNOSIS — Z9842 Cataract extraction status, left eye: Secondary | ICD-10-CM

## 2020-01-16 DIAGNOSIS — Z9181 History of falling: Secondary | ICD-10-CM

## 2020-01-16 DIAGNOSIS — I249 Acute ischemic heart disease, unspecified: Secondary | ICD-10-CM | POA: Diagnosis not present

## 2020-01-16 DIAGNOSIS — I509 Heart failure, unspecified: Secondary | ICD-10-CM | POA: Diagnosis not present

## 2020-01-16 DIAGNOSIS — Z7982 Long term (current) use of aspirin: Secondary | ICD-10-CM

## 2020-01-16 DIAGNOSIS — I213 ST elevation (STEMI) myocardial infarction of unspecified site: Secondary | ICD-10-CM | POA: Diagnosis not present

## 2020-01-16 DIAGNOSIS — R0600 Dyspnea, unspecified: Secondary | ICD-10-CM

## 2020-01-16 DIAGNOSIS — Z79899 Other long term (current) drug therapy: Secondary | ICD-10-CM

## 2020-01-16 DIAGNOSIS — I361 Nonrheumatic tricuspid (valve) insufficiency: Secondary | ICD-10-CM | POA: Diagnosis not present

## 2020-01-16 LAB — RESPIRATORY PANEL BY RT PCR (FLU A&B, COVID)
Influenza A by PCR: NEGATIVE
Influenza B by PCR: NEGATIVE
SARS Coronavirus 2 by RT PCR: NEGATIVE

## 2020-01-16 LAB — TROPONIN I (HIGH SENSITIVITY)
Troponin I (High Sensitivity): 844 ng/L (ref <18)
Troponin I (High Sensitivity): 1229 ng/L (ref <18)
Troponin I (High Sensitivity): 1193 ng/L (ref <18)
Troponin I (High Sensitivity): 1199 ng/L (ref <18)

## 2020-01-16 LAB — COMPREHENSIVE METABOLIC PANEL
ALT: 12 U/L (ref 0–44)
AST: 28 U/L (ref 15–41)
Albumin: 3.5 g/dL (ref 3.5–5.0)
Alkaline Phosphatase: 79 U/L (ref 38–126)
Anion gap: 9 (ref 5–15)
BUN: 33 mg/dL — ABNORMAL HIGH (ref 8–23)
CO2: 22 mmol/L (ref 22–32)
Calcium: 8.9 mg/dL (ref 8.9–10.3)
Chloride: 106 mmol/L (ref 98–111)
Creatinine, Ser: 1.45 mg/dL — ABNORMAL HIGH (ref 0.61–1.24)
GFR calc Af Amer: 53 mL/min — ABNORMAL LOW (ref >60)
GFR calc non Af Amer: 46 mL/min — ABNORMAL LOW (ref >60)
Glucose, Bld: 141 mg/dL — ABNORMAL HIGH (ref 70–99)
Potassium: 4.2 mmol/L (ref 3.5–5.1)
Sodium: 137 mmol/L (ref 135–145)
Total Bilirubin: 0.5 mg/dL (ref 0.3–1.2)
Total Protein: 7 g/dL (ref 6.5–8.1)

## 2020-01-16 LAB — CBC
HCT: 32.5 % — ABNORMAL LOW (ref 39.0–52.0)
Hemoglobin: 10.9 g/dL — ABNORMAL LOW (ref 13.0–17.0)
MCH: 32.8 pg (ref 26.0–34.0)
MCHC: 33.5 g/dL (ref 30.0–36.0)
MCV: 97.9 fL (ref 80.0–100.0)
Platelets: 173 10*3/uL (ref 150–400)
RBC: 3.32 MIL/uL — ABNORMAL LOW (ref 4.22–5.81)
RDW: 14.2 % (ref 11.5–15.5)
WBC: 6.7 10*3/uL (ref 4.0–10.5)
nRBC: 0 % (ref 0.0–0.2)

## 2020-01-16 LAB — HEMOGLOBIN A1C
Hgb A1c MFr Bld: 6.2 % — ABNORMAL HIGH (ref 4.8–5.6)
Mean Plasma Glucose: 131.24 mg/dL

## 2020-01-16 LAB — TSH: TSH: 4.765 u[IU]/mL — ABNORMAL HIGH (ref 0.350–4.500)

## 2020-01-16 SURGERY — LEFT HEART CATH AND CORONARY ANGIOGRAPHY
Anesthesia: LOCAL

## 2020-01-16 MED ORDER — SODIUM CHLORIDE 0.9% FLUSH
3.0000 mL | Freq: Two times a day (BID) | INTRAVENOUS | Status: DC
  Administered 2020-01-16 – 2020-01-25 (×11): 3 mL via INTRAVENOUS

## 2020-01-16 MED ORDER — NITROGLYCERIN 0.4 MG SL SUBL
0.4000 mg | SUBLINGUAL_TABLET | SUBLINGUAL | Status: DC | PRN
  Administered 2020-01-16 – 2020-01-21 (×7): 0.4 mg via SUBLINGUAL
  Filled 2020-01-16 (×4): qty 1

## 2020-01-16 MED ORDER — DONEPEZIL HCL 10 MG PO TABS
10.0000 mg | ORAL_TABLET | Freq: Every day | ORAL | Status: DC
  Administered 2020-01-16 – 2020-01-24 (×9): 10 mg via ORAL
  Filled 2020-01-16 (×10): qty 1

## 2020-01-16 MED ORDER — ATORVASTATIN CALCIUM 40 MG PO TABS
40.0000 mg | ORAL_TABLET | Freq: Every day | ORAL | Status: DC
  Administered 2020-01-16 – 2020-01-24 (×8): 40 mg via ORAL
  Filled 2020-01-16 (×9): qty 1

## 2020-01-16 MED ORDER — HEPARIN BOLUS VIA INFUSION
4000.0000 [IU] | Freq: Once | INTRAVENOUS | Status: AC
  Administered 2020-01-16: 4000 [IU] via INTRAVENOUS
  Filled 2020-01-16: qty 4000

## 2020-01-16 MED ORDER — ALPRAZOLAM 0.25 MG PO TABS: 0.2500 mg | ORAL_TABLET | Freq: Two times a day (BID) | ORAL | Status: DC | PRN

## 2020-01-16 MED ORDER — TRIAMCINOLONE ACETONIDE 0.1 % EX OINT
1.0000 "application " | TOPICAL_OINTMENT | Freq: Two times a day (BID) | CUTANEOUS | Status: DC | PRN
  Filled 2020-01-16: qty 15

## 2020-01-16 MED ORDER — HYDROXYZINE PAMOATE 25 MG PO CAPS
25.0000 mg | ORAL_CAPSULE | Freq: Three times a day (TID) | ORAL | Status: DC
  Filled 2020-01-16 (×2): qty 1

## 2020-01-16 MED ORDER — HEPARIN (PORCINE) 25000 UT/250ML-% IV SOLN
1000.0000 [IU]/h | INTRAVENOUS | Status: AC
  Administered 2020-01-16 – 2020-01-18 (×2): 1000 [IU]/h via INTRAVENOUS
  Filled 2020-01-16 (×4): qty 250

## 2020-01-16 MED ORDER — ADULT MULTIVITAMIN W/MINERALS CH
1.0000 | ORAL_TABLET | Freq: Every morning | ORAL | Status: DC
  Administered 2020-01-17 – 2020-01-25 (×9): 1 via ORAL
  Filled 2020-01-16 (×9): qty 1

## 2020-01-16 MED ORDER — ISOSORBIDE MONONITRATE ER 60 MG PO TB24: 60.0000 mg | ORAL_TABLET | Freq: Every evening | ORAL | Status: DC

## 2020-01-16 MED ORDER — ISOSORBIDE MONONITRATE ER 60 MG PO TB24
60.0000 mg | ORAL_TABLET | Freq: Every day | ORAL | Status: DC
  Administered 2020-01-16: 60 mg via ORAL
  Filled 2020-01-16: qty 1

## 2020-01-16 MED ORDER — CARVEDILOL 12.5 MG PO TABS
12.5000 mg | ORAL_TABLET | Freq: Two times a day (BID) | ORAL | Status: DC
  Administered 2020-01-16 – 2020-01-25 (×18): 12.5 mg via ORAL
  Filled 2020-01-16 (×18): qty 1

## 2020-01-16 MED ORDER — RASAGILINE MESYLATE 1 MG PO TABS
1.0000 mg | ORAL_TABLET | Freq: Every day | ORAL | Status: DC
  Administered 2020-01-17 – 2020-01-25 (×9): 1 mg via ORAL
  Filled 2020-01-16 (×10): qty 1

## 2020-01-16 MED ORDER — MORPHINE SULFATE (PF) 2 MG/ML IV SOLN
2.0000 mg | INTRAVENOUS | Status: AC | PRN
  Administered 2020-01-16 – 2020-01-17 (×2): 2 mg via INTRAVENOUS
  Filled 2020-01-16 (×2): qty 1

## 2020-01-16 MED ORDER — FOLIC ACID 1 MG PO TABS
1.0000 mg | ORAL_TABLET | Freq: Every day | ORAL | Status: DC
  Administered 2020-01-16 – 2020-01-24 (×9): 1 mg via ORAL
  Filled 2020-01-16 (×9): qty 1

## 2020-01-16 MED ORDER — FUROSEMIDE 10 MG/ML IJ SOLN
40.0000 mg | Freq: Once | INTRAMUSCULAR | Status: AC
  Administered 2020-01-16: 40 mg via INTRAVENOUS

## 2020-01-16 MED ORDER — ZOLPIDEM TARTRATE 5 MG PO TABS: 5.0000 mg | ORAL_TABLET | Freq: Every evening | ORAL | Status: DC | PRN

## 2020-01-16 MED ORDER — RISAQUAD PO CAPS
1.0000 | ORAL_CAPSULE | Freq: Two times a day (BID) | ORAL | Status: DC
  Administered 2020-01-16 – 2020-01-25 (×18): 1 via ORAL
  Filled 2020-01-16 (×19): qty 1

## 2020-01-16 MED ORDER — DULOXETINE HCL 60 MG PO CPEP
60.0000 mg | ORAL_CAPSULE | Freq: Every day | ORAL | Status: DC
  Administered 2020-01-16 – 2020-01-24 (×9): 60 mg via ORAL
  Filled 2020-01-16 (×10): qty 1

## 2020-01-16 MED ORDER — LEVOTHYROXINE SODIUM 100 MCG PO TABS
100.0000 ug | ORAL_TABLET | Freq: Every day | ORAL | Status: DC
  Administered 2020-01-17 – 2020-01-25 (×9): 100 ug via ORAL
  Filled 2020-01-16 (×9): qty 1

## 2020-01-16 MED ORDER — FUROSEMIDE 40 MG PO TABS
40.0000 mg | ORAL_TABLET | Freq: Every day | ORAL | Status: DC
  Administered 2020-01-17: 40 mg via ORAL
  Filled 2020-01-16: qty 1

## 2020-01-16 MED ORDER — AMLODIPINE BESYLATE 5 MG PO TABS
5.0000 mg | ORAL_TABLET | Freq: Every day | ORAL | Status: DC
  Administered 2020-01-17: 5 mg via ORAL
  Filled 2020-01-16: qty 1

## 2020-01-16 MED ORDER — OXYBUTYNIN CHLORIDE 5 MG PO TABS
5.0000 mg | ORAL_TABLET | Freq: Two times a day (BID) | ORAL | Status: DC
  Administered 2020-01-16 – 2020-01-25 (×18): 5 mg via ORAL
  Filled 2020-01-16 (×18): qty 1

## 2020-01-16 MED ORDER — CARBIDOPA-LEVODOPA 25-100 MG PO TABS
1.0000 | ORAL_TABLET | Freq: Two times a day (BID) | ORAL | Status: DC
  Administered 2020-01-16 – 2020-01-25 (×18): 1 via ORAL
  Filled 2020-01-16 (×19): qty 1

## 2020-01-16 MED ORDER — ONDANSETRON HCL 4 MG/2ML IJ SOLN
4.0000 mg | Freq: Four times a day (QID) | INTRAMUSCULAR | Status: DC | PRN
  Administered 2020-01-21 – 2020-01-23 (×3): 4 mg via INTRAVENOUS
  Filled 2020-01-16 (×3): qty 2

## 2020-01-16 MED ORDER — FUROSEMIDE 10 MG/ML IJ SOLN
40.0000 mg | Freq: Once | INTRAMUSCULAR | Status: DC
  Filled 2020-01-16: qty 4

## 2020-01-16 MED ORDER — ASPIRIN EC 81 MG PO TBEC: 81.0000 mg | DELAYED_RELEASE_TABLET | Freq: Every day | ORAL | Status: DC

## 2020-01-16 MED ORDER — CLOPIDOGREL BISULFATE 75 MG PO TABS
300.0000 mg | ORAL_TABLET | Freq: Once | ORAL | Status: AC
  Administered 2020-01-16: 300 mg via ORAL
  Filled 2020-01-16: qty 4

## 2020-01-16 MED ORDER — SIMETHICONE 80 MG PO CHEW
80.0000 mg | CHEWABLE_TABLET | Freq: Two times a day (BID) | ORAL | Status: DC
  Administered 2020-01-16 – 2020-01-25 (×18): 80 mg via ORAL
  Filled 2020-01-16 (×18): qty 1

## 2020-01-16 MED ORDER — CLOPIDOGREL BISULFATE 75 MG PO TABS
75.0000 mg | ORAL_TABLET | Freq: Every day | ORAL | Status: DC
  Administered 2020-01-17 – 2020-01-25 (×9): 75 mg via ORAL
  Filled 2020-01-16 (×9): qty 1

## 2020-01-16 MED ORDER — SODIUM CHLORIDE 0.9% FLUSH
3.0000 mL | INTRAVENOUS | Status: DC | PRN
  Administered 2020-01-22 – 2020-01-24 (×2): 3 mL via INTRAVENOUS

## 2020-01-16 MED ORDER — ISOSORBIDE DINITRATE 10 MG PO TABS
20.0000 mg | ORAL_TABLET | Freq: Three times a day (TID) | ORAL | Status: DC
  Filled 2020-01-16 (×2): qty 1

## 2020-01-16 MED ORDER — ACETAMINOPHEN 325 MG PO TABS
650.0000 mg | ORAL_TABLET | ORAL | Status: DC | PRN
  Administered 2020-01-20 – 2020-01-23 (×3): 650 mg via ORAL
  Filled 2020-01-16 (×3): qty 2

## 2020-01-16 MED ORDER — HYDROXYZINE HCL 25 MG PO TABS
25.0000 mg | ORAL_TABLET | Freq: Three times a day (TID) | ORAL | Status: DC
  Administered 2020-01-16 – 2020-01-25 (×26): 25 mg via ORAL
  Filled 2020-01-16 (×26): qty 1

## 2020-01-16 MED ORDER — ALPRAZOLAM 0.25 MG PO TABS
0.2500 mg | ORAL_TABLET | Freq: Two times a day (BID) | ORAL | Status: DC | PRN
  Administered 2020-01-17 – 2020-01-21 (×4): 0.25 mg via ORAL
  Filled 2020-01-16 (×4): qty 1

## 2020-01-16 MED ORDER — FERROUS SULFATE 325 (65 FE) MG PO TABS
325.0000 mg | ORAL_TABLET | Freq: Every day | ORAL | Status: DC
  Administered 2020-01-17 – 2020-01-25 (×8): 325 mg via ORAL
  Filled 2020-01-16 (×8): qty 1

## 2020-01-16 MED ORDER — SODIUM CHLORIDE 0.9 % IV SOLN: 250.0000 mL | INTRAVENOUS | Status: DC | PRN

## 2020-01-16 NOTE — Progress Notes (Signed)
Pt called out c/o chest pain radiating down the left arm. 9/10. Stated it was similar to what happened at home. EKG performed. VS taken. Nitro x3 given with no relief. PT now states pain is in both arms. MD notified. Stated he is reviewing chart and will adjust medication and order pain relief as patient is not a candidate for further interventions.

## 2020-01-16 NOTE — ED Triage Notes (Signed)
Pt here from home via [facility] for chest pain that started this morning. Pt took 324mg  aspirin and 3 nitro at home. Hx of MI. VSS, AOx3, disoriented to time. Pt c/o dull chest pain w/ pressure that radiates down L arm.

## 2020-01-16 NOTE — ED Provider Notes (Signed)
[facility] Emergency Department Provider Note MRN:  [id number]  Arrival date & time: 01/16/20     Chief Complaint   Chest Pain   History of Present Illness   [name] is a 79 y.o. year-old male with a history of CAD, dementia, Parkinson's presenting to the ED with chief complaint of chest pain.  Location: Central chest Duration: 4 hours Onset: Sudden Timing: Constant Description: Pressure Severity: Mild to moderate  Code STEMI was initiated but then canceled by cardiology.  I was unable to obtain an accurate HPI, PMH, or ROS due to the patient's dementia.  Level 5 caveat.   Review of Systems  Positive for chest pain.  Patient's Health History    Past Medical History:  Diagnosis Date  . Anxiety   . CAD (coronary artery disease)    a. 01/2019: cath showing severe multivessel disease with 100% CTO of mid RCA, 50% proximal circumflex with 90% AV groove followed by 100% CTO of LPL (that recanalizes through left left lateral), 90 to 95% bifurcation LAD-2nd Diag followed by 60% and then tandem 99 to 90% lesions in the mid to distal LAD. Not candidate for CABG or PCI. Med management recommended.   . Cardiomyopathy ([place])   . Chronic combined systolic and diastolic CHF (congestive heart failure) ([place])   . Dementia ([place])   . GERD (gastroesophageal reflux disease)   . Hyperlipidemia   . Hypertension   . Incontinence   . 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  . Myocardial infarction ([place])   . Neuromuscular disorder ([place])   . Parkinson's disease ([place])   . Peripheral vascular disease [facility]     Past Surgical History:  Procedure Laterality Date  . BLADDER REPAIR     pt sts "when I had my prostatectomy they cut too much and I have an internal button I have to press in order to release my urine"  . BLADDER SURGERY     2010  pump placed   . CATARACT EXTRACTION W/PHACO  07/28/2011   Procedure: CATARACT EXTRACTION PHACO AND  INTRAOCULAR LENS PLACEMENT (IOC);  Surgeon: Elta Guadeloupe T. Gershon Crane;  [facility];  Service: Ophthalmology;  Laterality: Right;  CDE: 20.26  . CATARACT EXTRACTION W/PHACO  09/08/2011   Procedure: CATARACT EXTRACTION PHACO AND INTRAOCULAR LENS PLACEMENT (IOC);  Surgeon: Elta Guadeloupe T. Gershon Crane;  [facility];  Service: Ophthalmology;  Laterality: Left;  CDE:37.31  . ENDARTERECTOMY Right 10/07/2016   Procedure: RIGHT ENDARTERECTOMY CAROTID WITH LIGATION OF INTERNAL CAROTID;  Surgeon: Serafina Mitchell, MD;  Location: [place];  Service: Vascular;  Laterality: Right;  . LEFT HEART CATH AND CORONARY ANGIOGRAPHY N/A 02/10/2019   Procedure: LEFT HEART CATH AND CORONARY ANGIOGRAPHY;  Surgeon: Leonie Man, MD;  Location: [place] CV LAB;  Service: Cardiovascular;  Laterality: N/A;  . PATCH ANGIOPLASTY Right 10/07/2016   Procedure: PATCH ANGIOPLASTY USING Rueben Bash BIOLOGIC PATCH;  Surgeon: Serafina Mitchell, MD;  Location: [place];  Service: Vascular;  Laterality: Right;  . PROSTATECTOMY    . TEE WITHOUT CARDIOVERSION N/A 06/10/2018   Procedure: TRANSESOPHAGEAL ECHOCARDIOGRAM (TEE);  Surgeon: Arnoldo Lenis, MD;  [facility];  Service: Endoscopy;  Laterality: N/A;  . YAG LASER APPLICATION  [id number]   Procedure: YAG LASER APPLICATION;  Surgeon: Elta Guadeloupe T. Gershon Crane, MD;  Location: [facility];  Service: Ophthalmology;  Laterality: Right;    Family History  Problem Relation Age of Onset  . Cancer Mother   .  Cancer Father   . Heart Problems Brother        has PPM  . Anesthesia problems Neg Hx   . Hypotension Neg Hx   . Malignant hyperthermia Neg Hx   . Pseudochol deficiency Neg Hx     Social History   Socioeconomic History  . Marital status: Married    Spouse name: Not on file  . Number of children: Not on file  . Years of education: Not on file  . Highest education level: Not on file  Occupational History  . Not on file  Tobacco Use  . Smoking status: Former Smoker    Packs/day: 0.50     Years: 10.00    Pack years: 5.00    Types: Cigarettes    Quit date: 07/24/1983    Years since quitting: 36.5  . Smokeless tobacco: Never Used  Substance and Sexual Activity  . Alcohol use: No  . Drug use: No  . Sexual activity: Never  Other Topics Concern  . Not on file  Social History Narrative  . Not on file   Social Determinants of Health   Financial Resource Strain:   . Difficulty of Paying Living Expenses: Not on file  Food Insecurity:   . Worried About Charity fundraiser in the Last Year: Not on file  . Ran Out of Food in the Last Year: Not on file  Transportation Needs:   . Lack of Transportation (Medical): Not on file  . Lack of Transportation (Non-Medical): Not on file  Physical Activity:   . Days of Exercise per Week: Not on file  . Minutes of Exercise per Session: Not on file  Stress:   . Feeling of Stress : Not on file  Social Connections:   . Frequency of Communication with Friends and Family: Not on file  . Frequency of Social Gatherings with Friends and Family: Not on file  . Attends Religious Services: Not on file  . Active Member of Clubs or Organizations: Not on file  . Attends Archivist Meetings: Not on file  . Marital Status: Not on file  Intimate Partner Violence:   . Fear of Current or Ex-Partner: Not on file  . Emotionally Abused: Not on file  . Physically Abused: Not on file  . Sexually Abused: Not on file     Physical Exam   Vitals:   01/16/20 1415 01/16/20 1430  BP: 110/63 117/76  Pulse: 85 85  Resp: 20 16  Temp:    SpO2: 95% 93%    CONSTITUTIONAL: Well-appearing, NAD NEURO:  Alert and oriented x 3, no focal deficits EYES:  eyes equal and reactive ENT/NECK:  no LAD, no JVD CARDIO: Regular rate, well-perfused, normal S1 and S2 PULM:  CTAB no wheezing or rhonchi GI/GU:  normal bowel sounds, non-distended, non-tender MSK/SPINE:  No gross deformities, no edema SKIN:  no rash, atraumatic PSYCH:  Appropriate speech and  behavior  *Additional and/or pertinent findings included in MDM below  Diagnostic and Interventional Summary    EKG Interpretation  Date/Time:  Tuesday January 16 2020 14:09:38 EST Ventricular Rate:  85 PR Interval:    QRS Duration: 115 QT Interval:  382 QTC Calculation: 455 R Axis:   43 Text Interpretation: Sinus rhythm Prolonged PR interval Probable left atrial enlargement st segment elevation III and V3 Confirmed by Gerlene Fee [phone]) on 01/16/2020 2:28:20 PM      Cardiac Monitoring Interpretation:  Labs Reviewed  CBC - Abnormal; Notable for the following  components:      Result Value   RBC 3.32 (*)    Hemoglobin 10.9 (*)    HCT 32.5 (*)    All other components within normal limits  RESPIRATORY PANEL BY RT PCR (FLU A&B, COVID)  COMPREHENSIVE METABOLIC PANEL  HEPARIN LEVEL (UNFRACTIONATED)  TROPONIN I (HIGH SENSITIVITY)    DG Chest Port 1 View  Final Result      Medications  heparin bolus via infusion 4,000 Units (has no administration in time range)  heparin ADULT infusion 100 units/mL (25000 units/291mL sodium chloride 0.45%) (has no administration in time range)     Procedures  /  Critical Care .Critical Care Performed by: Maudie Flakes, MD Authorized by: Maudie Flakes, MD   Critical care provider statement:    Critical care time (minutes):  35   Critical care was necessary to treat or prevent imminent or life-threatening deterioration of the following conditions: Acute coronary syndrome.   Critical care was time spent personally by me on the following activities:  Discussions with consultants, evaluation of patient's response to treatment, examination of patient, ordering and performing treatments and interventions, ordering and review of laboratory studies, ordering and review of radiographic studies, pulse oximetry, re-evaluation of patient's condition, obtaining history from patient or surrogate and review of old charts    ED Course and Medical  Decision Making  I have reviewed the triage vital signs, the nursing notes, and pertinent available records from the EMR.  Pertinent labs & imaging results that were available during my care of the patient were reviewed by me and considered in my medical decision making (see below for details).     Concerning EKG with ST elevations in lead III and V3.  Code STEMI was initiated, but then canceled by cardiology because patient is a very poor PCI candidate, has diffuse disease.  On my evaluation patient is pain-free and feeling much better.  Will admit to cardiology.    [name], [place] [email or web address]  Final Clinical Impressions(s) / ED Diagnoses     ICD-10-CM   1. Acute coronary syndrome [facility])  I24.9     ED Discharge Orders    None       Discharge Instructions Discussed with and Provided to Patient:   Discharge Instructions   None       Maudie Flakes, MD 01/16/20 [phone]

## 2020-01-16 NOTE — H&P (Addendum)
Cardiology History and Physical:   Patient ID: [name]; [id number]; [birth year]   Admit date: 01/16/2020 Date of Consult: 01/20/2020  Primary Care Provider: Celene Squibb, MD Primary Cardiologist: Carlyle Dolly, MD 12/22/2019 Primary Electrophysiologist:  None   Patient Profile:   [name] is a 79 y.o. male with a hx of S-D-CHF, ICM w/ EF 30-35% 02/2019 echo, NSTEMI 01/2019 w/ med rx for multi-vessel CAD, HTN, HLD, GERD, ruptured MV chordae, Parkinson's, dementia, thoracic aortic aneurysm, who is being seen today for the evaluation of chest pain-   at the request of Dr Sedonia Small.  History of Present Illness:   [name] was a no-show on 12/22/2019 for televisit. 08/2019 televisit, wt 180 lbs, no c/o SOB or CP.   EMS was called this am for CP -> code STEMI was called for ST elevations in ruminal 3 and aVF as well as V3 and V4 with T wave inversions in I and aVL. Pt had ASA 324 mg and SL NTG x 3. On arrival to ER, still w/ dull CP w/ radiation down L arm. In the ER, he is currently on heparin, not complaining of any chest pain.  Based on the patient's known history of extensive three-vessel CAD, and the previous decision not to proceed with intervention (because of history of falls, anemia, and dementia along with Parkinson's), it was a decision of the interventional team that we should opt for medical management and avoid urgent/emergent cath.  [name] is oriented to name and place.,  But not time  [name] knows he had chest pain today, but is unable to give many details about it. -When asked how much his pain was on a scale of 1-10, he says it was hurting and went to my arm.  All that he can complain about now is pain in his hand going up his arm. He thinks he was a little SOB, but no N&V or diaphoresis. He does not know how long it lasted. Thinks he has had it before, not sure when.   Currently complaining of pain in his leg, but cannot localize it.  Also notes that his foot left  more than right is "fat " -> indicating edema  C/o knot in his upper back, has been there a while, bothers him but does not hurt.   He denies any symptoms of irregular heartbeats or palpitations.  He felt a little dizzy and nauseated today, but no syncope or near syncope.  No TIA or amaurosis fugax.. As best I can tell, no melena, hematochezia or hematuria.  His wife is indicated that he has not.   Past Medical History:  Diagnosis Date  . Anxiety   . CAD (coronary artery disease)    a. 01/2019: cath showing severe multivessel disease with 100% CTO of mid RCA, 50% proximal circumflex with 90% AV groove followed by 100% CTO of LPL (that recanalizes through left left lateral), 90 to 95% bifurcation LAD-2nd Diag followed by 60% and then tandem 99 to 90% lesions in the mid to distal LAD. Not candidate for CABG or PCI. Med management recommended.   . Cardiomyopathy ([place])   . Chronic combined systolic and diastolic CHF (congestive heart failure) ([place])   . Dementia ([place])   . GERD (gastroesophageal reflux disease)   . Hyperlipidemia   . Hypertension   . Incontinence   . 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  . Myocardial infarction ([place])   . Neuromuscular disorder ([place])   .  Parkinson's disease ([place])   . Peripheral vascular disease [facility])     Past Surgical History:  Procedure Laterality Date  . BLADDER REPAIR     pt sts "when I had my prostatectomy they cut too much and I have an internal button I have to press in order to release my urine"  . BLADDER SURGERY     2010  pump placed   . CATARACT EXTRACTION W/PHACO  07/28/2011   Procedure: CATARACT EXTRACTION PHACO AND INTRAOCULAR LENS PLACEMENT (IOC);  Surgeon: Elta Guadeloupe T. Gershon Crane;  Location: [facility];  Service: Ophthalmology;  Laterality: Right;  CDE: 20.26  . CATARACT EXTRACTION W/PHACO  09/08/2011   Procedure: CATARACT EXTRACTION PHACO AND INTRAOCULAR LENS PLACEMENT (IOC);  Surgeon: Elta Guadeloupe T. Gershon Crane;  Location: [facility];  Service:  Ophthalmology;  Laterality: Left;  CDE:37.31  . ENDARTERECTOMY Right 10/07/2016   Procedure: RIGHT ENDARTERECTOMY CAROTID WITH LIGATION OF INTERNAL CAROTID;  Surgeon: Serafina Mitchell, MD;  Location: [place];  Service: Vascular;  Laterality: Right;  . LEFT HEART CATH AND CORONARY ANGIOGRAPHY N/A 02/10/2019   Procedure: LEFT HEART CATH AND CORONARY ANGIOGRAPHY;  Surgeon: Leonie Man, MD;  Location: [place] [facility];  Service: Cardiovascular;  Laterality: N/A;  . PATCH ANGIOPLASTY Right 10/07/2016   Procedure: PATCH ANGIOPLASTY USING Rueben Bash BIOLOGIC PATCH;  Surgeon: Serafina Mitchell, MD;  Location: [place];  Service: Vascular;  Laterality: Right;  . PROSTATECTOMY    . TEE WITHOUT CARDIOVERSION N/A 06/10/2018   Procedure: TRANSESOPHAGEAL ECHOCARDIOGRAM (TEE);  Surgeon: Arnoldo Lenis, MD;  [facility];  Service: Endoscopy;  Laterality: N/A;  . YAG LASER APPLICATION  [id number]   Procedure: YAG LASER APPLICATION;  Surgeon: Elta Guadeloupe T. Gershon Crane, MD;  Location: [facility];  Service: Ophthalmology;  Laterality: Right;     Prior to Admission medications   Medication Sig Start Date End Date Taking? Authorizing Provider  acetaminophen (TYLENOL) 325 MG tablet Take 2 tablets (650 mg total) by mouth every 6 (six) hours as needed for mild pain (or Fever >/= 101). 07/25/19   Johnson, Clanford L, MD  amLODipine (NORVASC) 5 MG tablet Take 1 tablet (5 mg total) by mouth daily. 04/19/19   Strader, Fransisco Hertz, PA-C  aspirin EC 81 MG tablet Take 81 mg by mouth daily.    [provider]  atorvastatin (LIPITOR) 40 MG tablet Take 1 tablet (40 mg total) by mouth daily at 6 PM. 04/19/19   Strader, [place] M, PA-C  carbidopa-levodopa (SINEMET IR) 25-100 MG tablet Take 1 tablet by mouth 2 (two) times daily.    [provider]  carvedilol (COREG) 12.5 MG tablet Take 1 tablet (12.5 mg total) by mouth 2 (two) times daily with a meal. 03/07/19   Strader, [place] M, PA-C  donepezil (ARICEPT) 10 MG tablet  Take 10 mg by mouth at bedtime.    [provider]  DULoxetine (CYMBALTA) 60 MG capsule Take 60 mg by mouth at bedtime.     [provider]  ferrous sulfate 325 (65 FE) MG tablet Take 1 tablet (325 mg total) by mouth daily with breakfast. 07/25/19   Johnson, Clanford L, MD  folic acid (FOLVITE) 1 MG tablet Take 1 mg by mouth at bedtime.     [provider]  furosemide (LASIX) 20 MG tablet Take 1 tablet (20 mg total) by mouth daily as needed (for weight gain greater than 3 pounds overnight or lower extremity edema). 07/27/19   Murlean Iba, MD  hydrOXYzine (ATARAX/VISTARIL) 25  MG tablet Take 25 mg by mouth 3 (three) times daily.     [provider]  isosorbide dinitrate (ISORDIL) 20 MG tablet Take 20 mg by mouth 3 (three) times daily.    [provider]  levothyroxine (SYNTHROID) 100 MCG tablet Take 100 mcg by mouth daily before breakfast.  04/04/19   [provider]  Multiple Vitamins-Minerals (MULTIVITAMIN WITH MINERALS) tablet Take 1 tablet by mouth every morning.     [provider]  nitroGLYCERIN (NITROSTAT) 0.4 MG SL tablet Place 1 tablet (0.4 mg total) under the tongue every 5 (five) minutes as needed for chest pain. 04/19/19   Strader, Fransisco Hertz, PA-C  oxybutynin (DITROPAN) 5 MG tablet Take 5 mg by mouth 2 (two) times daily. 05/07/15   [provider]  triamcinolone ointment (KENALOG) 0.1 % Apply 1 application topically 2 (two) times daily. Compound 1:1 with Eucerin. 08/24/19   Valentina Shaggy, MD    Inpatient Medications: Scheduled Meds:  Continuous Infusions:  PRN Meds:   Allergies:   No Known Allergies  Social History:   Social History   Tobacco Use  . Smoking status: Former Smoker    Packs/day: 0.50    Years: 10.00    Pack years: 5.00    Types: Cigarettes    Quit date: 07/24/1983    Years since quitting: 36.5  . Smokeless tobacco: Never Used  Substance Use Topics  . Alcohol use: No  . Drug  use: No   Social History   Social History Narrative   Patient is married to his wife [name].  In the past, his POA has been his brother [name], however according to Sharma Covert is now starting to have some the same memory issues and she is now therefore the main decision maker.      Via telephone conversation with [name], she indicates that [name] has a yellow DO NOT RESUSCITATE form that was sent home with him when he left the nursing facility 5 months ago.  I asked her if she wanted the same wish to be respected here, and she said yes.  She was somewhat confused by the difference between CPR and intubation, but with further explanation, she agreed that she would not want either to be done.     Family History:   Family History  Problem Relation Age of Onset  . Cancer Mother   . Cancer Father   . Heart Problems Brother        has PPM  . Anesthesia problems Neg Hx   . Hypotension Neg Hx   . Malignant hyperthermia Neg Hx   . Pseudochol deficiency Neg Hx    Family Status:  Family Status  Relation Name Status  . Mother  Deceased  . Father  Deceased  . Sister  Alive  . Brother  Alive  . Neg Hx  (Not Specified)    ROS:  Please see the history of present illness.  All other ROS reviewed and negative.     Physical Exam/Data:   Vitals:   01/20/20 0800 01/20/20 0937 01/20/20 1234 01/20/20 1246  BP: (!) 125/57 (!) 105/50 109/67   Pulse: 82 80 70   Resp: 11  (!) 8 20  Temp:      TempSrc:      SpO2: 92% 94% 95%   Weight:      Height:        Intake/Output Summary (Last 24 hours) at 01/20/2020 1424 Last data filed at 01/20/2020 1308 Gross per  24 hour  Intake 1570 ml  Output 1300 ml  Net 270 ml   Filed Weights   01/18/20 0319 01/19/20 0028 01/20/20 0338  Weight: 88.3 kg 87.7 kg 87.3 kg   Body mass index is 29.26 kg/m.  General:  Well nourished, well developed, male in no acute distress HEENT: normal Lymph: no adenopathy Neck: JVD - not elevated Endocrine:  No  thryomegaly Vascular: No carotid bruits; 4/4 extremity pulses 2+  Cardiac:  normal S1, S2; RRR; soft murmur Lungs:  clear bilaterally, no wheezing, rhonchi or rales  Abd: soft, nontender, no hepatomegaly  Ext: +pedal edema Musculoskeletal:  No deformities, BUE and BLE strength normal and equal Skin: warm and dry  Neuro:  CNs 2-12 intact, no focal abnormalities noted Psych:  Normal affect   EKG:  The EKG was personally reviewed and demonstrates:  02/02 ECG is SR, HR 85, ST elevation inferior leads plus V3-4, ST depression in I & aVL are new. Q waves anterolateral leads are old Telemetry:  Telemetry was personally reviewed and demonstrates:  SR   CV studies:   ECHO: 02/12/2019 1. The left ventricle has moderate-severely reduced systolic function,  with an ejection fraction of 30-35%. The cavity size was normal. There is  asymmetric septal hypertrophy.. Left ventricular diastolic Doppler  parameters are consistent with impaired  relaxation Elevated mean left atrial pressure.  2. The right ventricle has normal systolic function. The cavity was  normal. There is no increase in right ventricular wall thickness.  3. The aortic valve has an indeterminant number of cusps Moderate  thickening of the aortic valve Moderate calcification of the aortic valve.  Aortic valve regurgitation is mild to moderate by color flow Doppler. mild  stenosis of the aortic valve.  Moderate aortic annular calcification noted.  4. There is a mobile density within the MV apparatus that appears to be a  rupture chordae. Mild thickening of the mitral valve leaflet. Mild  calcification of the mitral valve leaflet. There is mild mitral annular  calcification present. No evidence of  mitral valve stenosis.  5. The tricuspid valve is normal in structure.  6. The aortic root is normal in size and structure.  7. There is moderate dilatation of the aortic root measuring 41 mm.  8. Moderate hypokinesis of the  left ventricular inferior wall.  9. Severe akinesis of the left ventricular anteroseptal wall.  10. Severe akinesis of the left ventricular apical segment.   CATH: 02/10/2019  RCA Prox RCA lesion is 50% stenosed. Prox RCA to Mid RCA lesion is 80% stenosed. Mid RCA lesion is 90% stenosed. Mid RCA to Dist RCA lesion is 100% stenosed.  ----- Part of the PDA and PL system was filled via right to right and left-to-right collaterals.  Cx-OM: Ost Cx to Prox Cx lesion is 45% stenosed with 90% stenosed side branch in LAV Groove.  ------ 3rd LPL-1 lesion is 80% stenosed. 3rd LPL-2 lesion is 99% stenosed.  Prox LAD to Mid LAD lesion is 90% stenosed with 55% stenosed side branch in Ost 2nd Diag.  Mid-distal LAD to Dist LAD lesion is 95% stenosed. Dist LAD lesion is 90% stenosed.  --  LV end diastolic pressure is moderately elevated. Consistent with acute on chronic diastolic heart failure   SUMMARY  Severe multivessel disease: 100% CTO of mid RCA, 50% proximal circumflex with 90% AV groove followed by 100% CTO of LPL (that recanalizes through left left lateral), 90 to 95% bifurcation LAD-2nd Diag followed by 60% and  then tandem 99 to 90% lesions in the mid to distal LAD. -- >  Minimal good PCI or CABG options  Moderately elevated LVEDP consistent with acute diastolic heart failure  RECOMMENDATIONS  The patient will be transferred to a telemetry bed.  I plan to restart IV heparin to run for 48 hours for MI.  (Will restart 8 hours post sheath removal)  Continue aggressive medical management.  I discussed the findings with the patient's power of attorney/brother and sister-in-law:  Options going forward would be medical management, CABG, random PCI.  After discussing with the brother: ? It is clear that PCI is not likely a good option as it would not be a good candidate for dual antiplatelet therapy with frequent falls, he is also was anemic.  ? CABG would not be something that they would be  interested in offering based on his dementia, renal insufficiency and Parkinson's.  Would not likely recover.   ? Best option going forward would be medical management.   With renal insufficiency, we will hydrate him, but he would likely require diuresis and standing diuretic during this hospitalization.  2D echo was ordered to better assess his EF, however I suspect it to be reduced. Diagnostic Dominance: Right     Laboratory Data:   Chemistry Recent Labs  Lab 01/18/20 0431 01/19/20 0434 01/20/20 0414  NA 140 138 134*  K 3.5 4.2 3.9  CL 104 104 99  CO2 26 24 25   GLUCOSE 103* 124* 119*  BUN 32* 32* 37*  CREATININE 1.63* 1.46* 1.66*  CALCIUM 8.6* 9.0 8.7*  GFRNONAA 40* 46* 39*  GFRAA 46* 53* 45*  ANIONGAP 10 10 10     Lab Results  Component Value Date   ALT 12 01/16/2020   AST 28 01/16/2020   ALKPHOS 79 01/16/2020   BILITOT 0.5 01/16/2020   Hematology Recent Labs  Lab 01/18/20 0431 01/19/20 0434 01/20/20 0414  WBC 6.8 7.0 6.3  RBC 3.17* 3.32* 3.03*  HGB 10.4* 10.8* 10.0*  HCT 30.5* 32.1* 28.9*  MCV 96.2 96.7 95.4  MCH 32.8 32.5 33.0  MCHC 34.1 33.6 34.6  RDW 14.0 14.0 13.5  PLT 148* 158 159   Cardiac Enzymes High Sensitivity Troponin:   Recent Labs  Lab 01/16/20 1412 01/16/20 1612 01/16/20 1843 01/16/20 2139 01/18/20 1045  TROPONINIHS 844* 1,229* 1,193* 1,199* 677*      BNPNo results for input(s): BNP, PROBNP in the last 168 hours.   TSH:  Lab Results  Component Value Date   TSH 4.765 (H) 01/16/2020   Lipids: Lab Results  Component Value Date   CHOL 100 01/16/2020   HDL 28 (L) 01/16/2020   LDLCALC 61 01/16/2020   TRIG 54 01/16/2020   CHOLHDL 3.6 01/16/2020   HgbA1c: Lab Results  Component Value Date   HGBA1C 6.2 (H) 01/16/2020   Magnesium:  Magnesium  Date Value Ref Range Status  07/24/2019 2.0 1.7 - 2.4 mg/dL Final    Comment:    Performed at [facility], [address], [place], [place] [address]      Radiology/Studies:  [facility] Chest Port 1 View  Result Date: 01/16/2020 CLINICAL DATA:  Onset chest pain radiating into the left arm today. EXAM: PORTABLE CHEST 1 VIEW COMPARISON:  Single-view of the chest 07/21/2019. CT chest 02/09/2019. FINDINGS: Scattered bilateral punctate granulomata are again. Heart size is normal seen. No pneumothorax. Lungs are otherwise clear or pleural effusion. No acute or focal bony abnormality. IMPRESSION: No acute disease. Electronically Signed  By: Inge Rise M.D.   On: 01/16/2020 14:53   ECHOCARDIOGRAM COMPLETE  Result Date: 01/17/2020   ECHOCARDIOGRAM REPORT   Patient Name:   VALERIAN RILL Date of Exam: 01/17/2020 Medical Rec #:  [id number]     Height:       68.0 in Accession #:    [id number]    Weight:       194.7 lb Date of Birth:  1941/09/17    BSA:          2.02 m Patient Age:    86 years      BP:           121/67 mmHg Patient Gender: M             HR:           78 bpm. Exam Location:  Inpatient Procedure: 2D Echo, Intracardiac Opacification Agent, Cardiac Doppler and Color            Doppler Indications:    R07.9* Chest pain, unspecified  History:        Patient has prior history of Echocardiogram examinations, most                 recent 02/12/2019. CHF and Cardiomyopathy, CAD and Previous                 Myocardial Infarction, Mitral Valve Disease; Risk                 Factors:Dyslipidemia. Parkinson's Disease. GERD.  Sonographer:    Jonelle Sidle Dance Referring Phys: 76 [place]  1. Left ventricular ejection fraction, by visual estimation, is 35 to 40%. The left ventricle has low normal function. Left ventricular septal wall thickness was moderately increased. There is moderately increased left ventricular hypertrophy.  2. The left ventricle demonstrates regional wall motion abnormalities. Apical akinesis with midseptal hypokinesis. No significant change in regional wall motion abnormalities compared to exam from 02/12/2019. Side by side comparison  of images performed.  3. Calcifed, mobile mass in the left ventricle. This has been previously documented on echocardiograms serially, and is felt to represent a ruptured chordae tendinae.  4. Global right ventricle has normal systolic function.The right ventricular size is normal. No increase in right ventricular wall thickness.  5. Left atrial size was normal.  6. Right atrial size was normal.  7. Mild mitral annular calcification.  8. The mitral valve is normal in structure. Mild mitral valve regurgitation. No evidence of mitral stenosis.  9. The tricuspid valve is normal in structure. Tricuspid valve regurgitation is not demonstrated. 10. The aortic valve was not well visualized. Aortic valve regurgitation is mild. Mild to moderate aortic valve sclerosis/calcification without any evidence of aortic stenosis. 11. The pulmonic valve was not well visualized. Pulmonic valve regurgitation is trivial. 12. The inferior vena cava is normal in size with greater than 50% respiratory variability, suggesting right atrial pressure of 3 mmHg. 13. There is borderline dilatation of the aortic root measuring 39 mm. FINDINGS  Left Ventricle: Left ventricular ejection fraction, by visual estimation, is 35 to 40%. The left ventricle has low normal function. Definity contrast agent was given IV to delineate the left ventricular endocardial borders. The left ventricle demonstrates regional wall motion abnormalities. There is moderately increased left ventricular hypertrophy. Asymmetric left ventricular hypertrophy. Left ventricular diastolic parameters are indeterminate. Right Ventricle: The right ventricular size is normal. No increase in right ventricular wall thickness. Global RV systolic function is has normal systolic  function. Left Atrium: Left atrial size was normal in size. Right Atrium: Right atrial size was normal in size Pericardium: There is no evidence of pericardial effusion. Mitral Valve: The mitral valve is normal in  structure. Mild mitral annular calcification. Mild mitral valve regurgitation. No evidence of mitral valve stenosis by observation. Calcifed, mobile mass in the left ventricle. This has been previously documented on echocardiograms serially, and is felt to represent a ruptured chordae tendinae. Tricuspid Valve: The tricuspid valve is normal in structure. Tricuspid valve regurgitation is not demonstrated. Aortic Valve: The aortic valve was not well visualized. . There is moderate thickening and moderate calcification of the aortic valve. Aortic valve regurgitation is mild. Aortic regurgitation PHT measures 542 msec. Mild to moderate aortic valve sclerosis/calcification is present, without any evidence of aortic stenosis. There is moderate thickening of the aortic valve. There is moderate calcification of the aortic valve. Pulmonic Valve: The pulmonic valve was not well visualized. Pulmonic valve regurgitation is trivial. Pulmonic regurgitation is trivial. Aorta: The aortic root, ascending aorta and aortic arch are all structurally normal, with no evidence of dilitation or obstruction. There is borderline dilatation of the aortic root measuring 39 mm. Venous: The inferior vena cava is normal in size with greater than 50% respiratory variability, suggesting right atrial pressure of 3 mmHg. IAS/Shunts: The interatrial septum was not well visualized.  LEFT VENTRICLE PLAX 2D LVIDd:         4.03 cm  Diastology LVIDs:         3.12 cm  LV e' lateral:   9.14 cm/s LV PW:         1.11 cm  LV E/e' lateral: 9.1 LV IVS:        1.66 cm  LV e' medial:    4.90 cm/s LVOT diam:     1.70 cm  LV E/e' medial:  17.1 LV SV:         33 ml LV SV Index:   15.73 LVOT Area:     2.27 cm  RIGHT VENTRICLE            IVC RV Basal diam:  2.26 cm    IVC diam: 1.91 cm RV S prime:     8.59 cm/s TAPSE (M-mode): 1.5 cm LEFT ATRIUM             Index       RIGHT ATRIUM           Index LA diam:        3.60 cm 1.78 cm/m  RA Area:     15.00 cm LA Vol (A2C):    53.6 ml 26.53 ml/m RA Volume:   33.40 ml  16.53 ml/m LA Vol (A4C):   70.3 ml 34.80 ml/m LA Biplane Vol: 62.2 ml 30.79 ml/m  AORTIC VALVE LVOT Vmax:   113.90 cm/s LVOT Vmean:  74.600 cm/s LVOT VTI:    0.220 m AI PHT:      542 msec  AORTA Ao Root diam: 3.90 cm Ao Asc diam:  3.70 cm MITRAL VALVE MV Area (PHT): 2.73 cm              SHUNTS MV PHT:        80.62 msec            Systemic VTI:  0.22 m MV Decel Time: 278 msec              Systemic Diam: 1.70 cm MV E velocity: 83.60 cm/s  103 cm/s  MV A velocity: 150.00 cm/s 70.3 cm/s MV E/A ratio:  0.56        1.5  Cherlynn Kaiser MD Electronically signed by Cherlynn Kaiser MD Signature Date/Time: 01/17/2020/8:25:24 PM    Final     Assessment and Plan:   1. ANTERIOR-INFERIOR STEMI ; Coronary Artery Disease, Occlusive - Trop and other ez are pending - ECG w/ significant changes, but Dr Ellyn Hack reviewed the films an med rx is the only option. This is because of the location of the more proximal lesions (bifurcation, likelihood of jailing a currently patent vessel) and significant distal disease.  - continue heparin for 24-48 hr, depending on how high the ez go.  - discuss changing from isordil to Imdur for more consistent blood levels and ease of dosing - continue ASA, BB, statin - ck lipid profile, TSH, HgbA1c - ck echo  2. Parkinson's and other issues, continue home meds.   3. Code status - spoke w/ his brother, [name], HCPOA by phone -->  - pt had previously expressed a wish not to be shocked, did not wish to be put through things - Advised his brother that he had been a DNR in his previous admission, we will continue this.  (Similar discussion had with the patient's wife.  They do have a DNR form at home -see Social History.)   Principal Problem:   Acute ST elevation myocardial infarction (STEMI) of anterior wall (HCC) Active Problems:   Coronary artery disease, occlusive   Acute on chronic combined systolic and diastolic HF (heart failure)  (HCC)   Dementia due to Parkinson's disease with behavioral disturbance (HCC)   Hyperlipidemia   Essential hypertension   CKD stage G3a/A2, GFR 45-59 and albumin creatinine ratio 30-299 mg/g   Cardiomyopathy EF 30-35%   Parkinson's disease ([place])   Hypothyroidism   Signed, Barrett, Evelene Croon, PA-C  Physician Assistant Certified  Cardiology   01/20/2020 2:21 PM  ATTENDING ATTESTATION  I have seen, examined and evaluated the patient in the emergency room upon the patient's arrival.  He was seen and discussed along with Rosaria Ferries, PA.  After reviewing all the available data and chart, we discussed the patients laboratory, study & physical findings as well as symptoms in detail. I agree with her findings, examination as well as impression recommendations as per our discussion.    Attending adjustments noted in italics.   Diamond Olivares was brought in to the ER as a possible code STEMI.  He clearly does have ST elevations in both inferior and anterior leads.  Interestingly, not necessarily anterolateral or septal leads.  I personally reviewed his Cardiac films along with Dr. Martinique, and we both feel that much like at the time of his initial cardiac catheterization, that there is not much to be offered from a PCI standpoint that would be beneficial.  LAD PCI would require long stenting in the distal half of the LAD is essentially subtotaled. The inferior injury pattern is explained by the fact that the RCA is perfused via the LAD with septal perforators and collateral from the distal LAD which is likely ischemic given anterior injury pattern.  After discussion with Dr. Martinique, we both agreed to cancel Code STEMI and opt for medical management given all the reasons explained during the initial catheterization.  Also the patient is currently chest pain-free and EKG has improved somewhat from EMS to ER.  We will admit him as a STEMI given EKG changes and cycle troponins.  If troponins do  not have  the classic dramatic rise noted during a STEMI, will change the diagnosis to non-STEMI.  We will use IV heparin for 72 hours. Convert from Isordil to Imdur at stable dosing. Given the fact there may have well been plaque rupture we will load with Plavix and anticipate at least maybe a month of Plavix before going back to aspirin.  We will have to watch his blood counts to ensure no sign of bleeding.  He does have a history of anemia and GI bleeds which precluded extensive PCI at the time of his last cath.  He does have ischemic cardiomyopathy with mild features of chronic combined systolic and diastolic heart failure with mild orthopnea and pedal edema. --> He is currently on 20 mg of furosemide at home.  Will give 1 dose 20 mg IV and then start at 40 mg p.o. daily while in house.  Reassess volume status.  We will continue his home medications including Aricept, Cymbalta, Synthroid and hydroxyzine as well as Ditropan.  He is also on as AZILECT which will be continued.  His blood pressure is relatively stable currently on amlodipine and carvedilol.  We may have room to increase.  He is not currently on hydralazine.  Order written to confirm DNR status. Anticipate discharge if stable after 72 to 84 hours for admission.   Glenetta Hew, M.D., M.S. Interventional Cardiologist   Pager # [phone] Phone # [phone] [address] [place], [place] [address]    For questions or updates, please contact [place] Please consult [email or web address] for contact info under Cardiology/STEMI.

## 2020-01-16 NOTE — Progress Notes (Signed)
ANTICOAGULATION CONSULT NOTE - Initial Consult  Pharmacy Consult for heparin Indication: chest pain/ACS  No Known Allergies  Patient Measurements:   Heparin Dosing Weight: TBW  Vital Signs: Temp: 98.9 F (37.2 C) (02/02 1353) Temp Source: Oral (02/02 1353) BP: 121/69 (02/02 1400) Pulse Rate: 88 (02/02 1400)  Labs: Recent Labs    01/16/20 1412  HGB 10.9*  HCT 32.5*  PLT 173    CrCl cannot be calculated (Patient's most recent lab result is older than the maximum 21 days allowed.).   Medical History: Past Medical History:  Diagnosis Date  . Anxiety   . CAD (coronary artery disease)    a. 01/2019: cath showing severe multivessel disease with 100% CTO of mid RCA, 50% proximal circumflex with 90% AV groove followed by 100% CTO of LPL (that recanalizes through left left lateral), 90 to 95% bifurcation LAD-2nd Diag followed by 60% and then tandem 99 to 90% lesions in the mid to distal LAD. Not candidate for CABG or PCI. Med management recommended.   . Cardiomyopathy ([place])   . Chronic combined systolic and diastolic CHF (congestive heart failure) ([place])   . Dementia ([place])   . GERD (gastroesophageal reflux disease)   . Hyperlipidemia   . Hypertension   . Incontinence   . 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  . Myocardial infarction ([place])   . Neuromuscular disorder ([place])   . Parkinson's disease ([place])   . Peripheral vascular disease ([place])    Assessment: 70 YOM presenting with CP, hx of CAD, not on anticoagulation PTA.  Chronic anemia stable, plts 173.    Goal of Therapy:  Heparin level 0.3-0.7 units/ml Monitor platelets by anticoagulation protocol: Yes   Plan:  Heparin 4000 units IV x1, and gtt at 1000 units/hr F/u 8 hour heparin level  Bertis Ruddy, PharmD Clinical Pharmacist Please check AMION for all [place] numbers 01/16/2020 2:37 PM

## 2020-01-17 ENCOUNTER — Inpatient Hospital Stay (HOSPITAL_COMMUNITY): Payer: PPO

## 2020-01-17 DIAGNOSIS — I351 Nonrheumatic aortic (valve) insufficiency: Secondary | ICD-10-CM

## 2020-01-17 DIAGNOSIS — I361 Nonrheumatic tricuspid (valve) insufficiency: Secondary | ICD-10-CM

## 2020-01-17 LAB — LIPID PANEL
Cholesterol: 100 mg/dL (ref 0–200)
HDL: 28 mg/dL — ABNORMAL LOW (ref >40)
LDL Cholesterol: 61 mg/dL (ref 0–99)
Total CHOL/HDL Ratio: 3.6 RATIO
Triglycerides: 54 mg/dL (ref <150)
VLDL: 11 mg/dL (ref 0–40)

## 2020-01-17 LAB — BASIC METABOLIC PANEL
Anion gap: 10 (ref 5–15)
BUN: 33 mg/dL — ABNORMAL HIGH (ref 8–23)
CO2: 23 mmol/L (ref 22–32)
Calcium: 8.8 mg/dL — ABNORMAL LOW (ref 8.9–10.3)
Chloride: 104 mmol/L (ref 98–111)
Creatinine, Ser: 1.48 mg/dL — ABNORMAL HIGH (ref 0.61–1.24)
GFR calc Af Amer: 52 mL/min — ABNORMAL LOW (ref >60)
GFR calc non Af Amer: 45 mL/min — ABNORMAL LOW (ref >60)
Glucose, Bld: 133 mg/dL — ABNORMAL HIGH (ref 70–99)
Potassium: 4.1 mmol/L (ref 3.5–5.1)
Sodium: 137 mmol/L (ref 135–145)

## 2020-01-17 LAB — CBC
HCT: 31.4 % — ABNORMAL LOW (ref 39.0–52.0)
Hemoglobin: 10.9 g/dL — ABNORMAL LOW (ref 13.0–17.0)
MCH: 33.1 pg (ref 26.0–34.0)
MCHC: 34.7 g/dL (ref 30.0–36.0)
MCV: 95.4 fL (ref 80.0–100.0)
Platelets: 154 10*3/uL (ref 150–400)
RBC: 3.29 MIL/uL — ABNORMAL LOW (ref 4.22–5.81)
RDW: 14.1 % (ref 11.5–15.5)
WBC: 6 10*3/uL (ref 4.0–10.5)
nRBC: 0 % (ref 0.0–0.2)

## 2020-01-17 LAB — HEPARIN LEVEL (UNFRACTIONATED)
Heparin Unfractionated: 0.3 IU/mL (ref 0.30–0.70)
Heparin Unfractionated: 0.33 IU/mL (ref 0.30–0.70)

## 2020-01-17 LAB — ECHOCARDIOGRAM COMPLETE
Height: 68 in
Weight: 3114.66 oz

## 2020-01-17 MED ORDER — NITROGLYCERIN 2 % TD OINT
1.0000 [in_us] | TOPICAL_OINTMENT | Freq: Four times a day (QID) | TRANSDERMAL | Status: DC
  Administered 2020-01-17 – 2020-01-19 (×8): 1 [in_us] via TOPICAL
  Filled 2020-01-17: qty 30

## 2020-01-17 MED ORDER — PERFLUTREN LIPID MICROSPHERE
1.0000 mL | INTRAVENOUS | Status: AC | PRN
  Administered 2020-01-17: 3 mL via INTRAVENOUS
  Filled 2020-01-17: qty 10

## 2020-01-17 MED ORDER — AMLODIPINE BESYLATE 2.5 MG PO TABS
2.5000 mg | ORAL_TABLET | Freq: Once | ORAL | Status: AC
  Administered 2020-01-17: 2.5 mg via ORAL
  Filled 2020-01-17: qty 1

## 2020-01-17 MED ORDER — MORPHINE SULFATE (PF) 2 MG/ML IV SOLN
1.0000 mg | INTRAVENOUS | Status: DC | PRN
  Administered 2020-01-17 – 2020-01-18 (×3): 1 mg via INTRAVENOUS
  Filled 2020-01-17 (×3): qty 1

## 2020-01-17 MED ORDER — FUROSEMIDE 40 MG PO TABS
40.0000 mg | ORAL_TABLET | Freq: Two times a day (BID) | ORAL | Status: DC
  Administered 2020-01-17 – 2020-01-25 (×15): 40 mg via ORAL
  Filled 2020-01-17 (×16): qty 1

## 2020-01-17 MED ORDER — AMLODIPINE BESYLATE 5 MG PO TABS
7.5000 mg | ORAL_TABLET | Freq: Every day | ORAL | Status: DC
  Administered 2020-01-18: 7.5 mg via ORAL
  Filled 2020-01-17: qty 1

## 2020-01-17 MED ORDER — FUROSEMIDE 10 MG/ML IJ SOLN
40.0000 mg | Freq: Once | INTRAMUSCULAR | Status: AC
  Administered 2020-01-17: 40 mg via INTRAVENOUS
  Filled 2020-01-17: qty 4

## 2020-01-17 NOTE — Progress Notes (Addendum)
Progress Note  Patient Name: [name] Date of Encounter: 01/17/2020  Primary Care Provider: Celene Squibb, MD Primary Cardiologist: Carlyle Dolly, MD 12/22/2019 Primary Electrophysiologist:  None  Patient Profile:   [name] is a 79 y.o. male with a hx of combined systolic and diastolic CHF (EF 99991111 on echo 02/2019), ICM, NSTEMI 01/2019 w/ med rx for multi-vessel CAD, HTN, HLD, GERD, ruptured MV chordae, Parkinson's, dementia, thoracic aortic aneurysm, who presented with substernal chest pain that radiated to his arm.  today for the evaluation of chest pain. Code STEMI called but canceled then. His EKG changes and elevated trop is now consistent with STEMI definition, however Trope elevation is not as high as expected with current extensive EKG changes. Not a candidate for intervention due to comorbidities and is getting medical management.   Subjective  Patient was seen and evaluated at bedside this morning. He had chest pain last night. Did not respond to sublingual nitro but resolved with second dose of Morphine. He currently has some pain at 5/10 at middle of his chest. No SOB. Also complains of mild lower back pain.  ADDENDUM: later in the morning and during team round, patient reports 7-8/10 substernal chest pain that radiates to his arm. No nausea or vomiting. No SOB.  Inpatient Medications    Scheduled Meds:  acidophilus  1 capsule Oral BID   [START ON 01/18/2020] amLODipine  7.5 mg Oral Daily   atorvastatin  40 mg Oral q1800   carbidopa-levodopa  1 tablet Oral BID   carvedilol  12.5 mg Oral BID WC   clopidogrel  75 mg Oral Daily   donepezil  10 mg Oral QHS   DULoxetine  60 mg Oral QHS   ferrous sulfate  325 mg Oral Q breakfast   folic acid  1 mg Oral QHS   hydrOXYzine  25 mg Oral TID   levothyroxine  100 mcg Oral QAC breakfast   multivitamin with minerals  1 tablet Oral q morning - 10a   nitroGLYCERIN  1 inch Topical Q6H   oxybutynin  5 mg  Oral BID   rasagiline  1 mg Oral Daily   simethicone  80 mg Oral BID   sodium chloride flush  3 mL Intravenous Q12H   Continuous Infusions:  sodium chloride     heparin 1,000 Units/hr (01/16/20 1500)   PRN Meds: sodium chloride, acetaminophen, ALPRAZolam, morphine injection, nitroGLYCERIN, ondansetron (ZOFRAN) IV, perflutren lipid microspheres (DEFINITY) IV suspension, sodium chloride flush, triamcinolone ointment, zolpidem   Vital Signs    Vitals:   01/17/20 0600 01/17/20 0900 01/17/20 0917 01/17/20 1100  BP: 123/78 (!) 142/93  133/71  Pulse: 61 79 86 79  Resp: 14 15 (!) 21 17  Temp:      TempSrc:      SpO2: 96% 90% (!) 88% 97%  Weight:      Height:        Intake/Output Summary (Last 24 hours) at 01/17/2020 1347 Last data filed at 01/17/2020 1302 Gross per 24 hour  Intake 1337.28 ml  Output 1700 ml  Net -362.72 ml   Last 3 Weights 01/17/2020 01/16/2020 01/16/2020  Weight (lbs) 194 lb 10.7 oz 198 lb 13.7 oz 182 lb 15.7 oz  Weight (kg) 88.3 kg 90.2 kg 83 kg      Telemetry    No arrhythmia, no pause- Personally Reviewed  ECG    ST elevation extended to V2-V6, also present at inferior leads - Personally Reviewed  Physical Exam  GEN: No acute distress but reports chest pain Neck: JVP is elevated Cardiac: RRR, no murmurs, rubs, or gallops.  Respiratory: On 4 li [place], O2 sat at high 80s-low 90s but no respiratory distress, mild bibasilar crackles bilaterally. GI: Soft, nontender, non-distended  MS: 1+ LEE edema; No deformity. Neuro:  Nonfocal, Alert to person and place Psych: Normal affect   Labs    High Sensitivity Troponin:   Recent Labs  Lab 01/16/20 1412 01/16/20 1612 01/16/20 1843 01/16/20 2139  TROPONINIHS 844* 1,229* 1,193* 1,199*      Chemistry Recent Labs  Lab 01/16/20 1412 01/16/20 2334  NA 137 137  K 4.2 4.1  CL 106 104  CO2 22 23  GLUCOSE 141* 133*  BUN 33* 33*  CREATININE 1.45* 1.48*  CALCIUM 8.9 8.8*  PROT 7.0  --   ALBUMIN 3.5  --    AST 28  --   ALT 12  --   ALKPHOS 79  --   BILITOT 0.5  --   GFRNONAA 46* 45*  GFRAA 53* 52*  ANIONGAP 9 10     Hematology Recent Labs  Lab 01/16/20 1412 01/16/20 2334  WBC 6.7 6.0  RBC 3.32* 3.29*  HGB 10.9* 10.9*  HCT 32.5* 31.4*  MCV 97.9 95.4  MCH 32.8 33.1  MCHC 33.5 34.7  RDW 14.2 14.1  PLT 173 154    BNPNo results for input(s): BNP, PROBNP in the last 168 hours.   DDimer No results for input(s): DDIMER in the last 168 hours.   Radiology    DG Chest Port 1 View  Result Date: 01/16/2020 CLINICAL DATA:  Onset chest pain radiating into the left arm today. EXAM: PORTABLE CHEST 1 VIEW COMPARISON:  Single-view of the chest 07/21/2019. CT chest 02/09/2019. FINDINGS: Scattered bilateral punctate granulomata are again. Heart size is normal seen. No pneumothorax. Lungs are otherwise clear or pleural effusion. No acute or focal bony abnormality. IMPRESSION: No acute disease. Electronically Signed   By: Inge Rise M.D.   On: 01/16/2020 14:53    Cardiac Studies   ECHO: 02/12/2019 1. The left ventricle has moderate-severely reduced systolic function,  with an ejection fraction of 30-35%. The cavity size was normal. There is  asymmetric septal hypertrophy.. Left ventricular diastolic Doppler  parameters are consistent with impaired  relaxation Elevated mean left atrial pressure.  2. The right ventricle has normal systolic function. The cavity was  normal. There is no increase in right ventricular wall thickness.  3. The aortic valve has an indeterminant number of cusps Moderate  thickening of the aortic valve Moderate calcification of the aortic valve.  Aortic valve regurgitation is mild to moderate by color flow Doppler. mild  stenosis of the aortic valve.  Moderate aortic annular calcification noted.  4. There is a mobile density within the MV apparatus that appears to be a  rupture chordae. Mild thickening of the mitral valve leaflet. Mild  calcification of  the mitral valve leaflet. There is mild mitral annular  calcification present. No evidence of  mitral valve stenosis.  5. The tricuspid valve is normal in structure.  6. The aortic root is normal in size and structure.  7. There is moderate dilatation of the aortic root measuring 41 mm.  8. Moderate hypokinesis of the left ventricular inferior wall.  9. Severe akinesis of the left ventricular anteroseptal wall.  10. Severe akinesis of the left ventricular apical segment.   CATH: 02/10/2019  RCA Prox RCA lesion is 50% stenosed. Prox RCA  to Mid RCA lesion is 80% stenosed. Mid RCA lesion is 90% stenosed. Mid RCA to Dist RCA lesion is 100% stenosed.  ----- Part of the PDA and PL system was filled via right to right and left-to-right collaterals.  Cx-OM: Ost Cx to Prox Cx lesion is 45% stenosed with 90% stenosed side branch in LAV Groove.  ------ 3rd LPL-1 lesion is 80% stenosed. 3rd LPL-2 lesion is 99% stenosed.  Prox LAD to Mid LAD lesion is 90% stenosed with 55% stenosed side branch in Ost 2nd Diag.  Mid-distal LAD to Dist LAD lesion is 95% stenosed. Dist LAD lesion is 90% stenosed.  --  LV end diastolic pressure is moderately elevated. Consistent with acute on chronic diastolic heart failure  SUMMARY  Severe multivessel disease: 100% CTO of mid RCA, 50% proximal circumflex with 90% AV groove followed by 100% CTO of LPL (that recanalizes through left left lateral), 90 to 95% bifurcation LAD-2nd Diag followed by 60% and then tandem 99 to 90% lesions in the mid to distal LAD. -- >Minimal good PCI or CABG options  Moderately elevated LVEDP consistent with acute diastolic heart failure  RECOMMENDATIONS  The patient will be transferred to a telemetry bed. I plan to restart IV heparin to run for 48 hours for MI. (Will restart 8 hours post sheath removal)  Continue aggressive medical management.  I discussed the findings with the patient's power of attorney/brother and  sister-in-law: Options going forward would be medical management, CABG, random PCI. After discussing with the brother: ? It is clear that PCI is not likely a good option as it would not be a good candidate for dual antiplatelet therapy with frequent falls, he is also was anemic.  ? CABG would not be something that they would be interested in offering based on his dementia, renal insufficiency and Parkinson's. Would not likely recover.  ? Best option going forward would be medical management.   With renal insufficiency, we will hydrate him, but he would likely require diuresis and standing diuretic during this hospitalization.  2D echo was ordered to better assess his EF, however I suspect it to be reduced. t     Patient Profile     79 y.o. male with a hx of combined systolic and diastolic CHF (EF 99991111 on echo 02/2019), ICM, NSTEMI 01/2019 w/ med rx for multi-vessel CAD, HTN, HLD, GERD, ruptured MV chordae, Parkinson's, dementia, thoracic aortic aneurysm, who presented with substernal chest pain that radiated to his arm.  today for the evaluation of chest pain. Code STEMI called but canceled then. His EKG changes and elevated trop is now consistent with STEMI definition, however Trope elevation is not as high as expected with current extensive EKG changes. Not a candidate for intervention due to comorbidities and is getting medical management.   Assessment & Plan    STEMI/UA: Patient with Hx of CAD, NSTEMI, sever multivessel disease, PVD, thorasic aorta aneurysm, dementia, parkinson, Hx of falls, Preseneted with chest pain with radiation to arm. EKG with ST elevation at inferior leads and Trop at 844>1229>1193>1199.  Code STEMI called but canceled later. His EKG this morning showed mild extension of ST elevation to lateral and anteroseptal leads. His EKG changes and elevated trop is now consistent with STEMI definition, however Trope elevation is not as high as expected with current  extensive EKG changes.   He continues to have intermittent chest pain, required Morphine for pain control last night.  BP is labile. SBP ranged between at 100-150 HbA1c:  6.2 (Pre DM), TSH mildly elevated at 4.76. Is on Levo thyroxin at home.   -Unfortunately, he is not a candidate for any intervention given age, comorbidities: dementia, parkinson, Hx of anemia and high risk for falls and bleeding (and not being able to put on long term DAP after PCI/stent) -Will continue medical management as below, pain control and optimizing meds as possible -Added Plavix today. (Received one ASA and Plavix once yesterday) -Continue IV Heparin -[place] sublingual NG to Nitropaste -Morphine 1 mg q2h PRN for uncontrolled chest pain. Monitor BP  -Stop Imdur -Continue Coreg 12.5 mg BID -Increase Amlodipine to 7.5 mg QD for antianginal effect and to assist with BP control. (received 5 mg this AM, adding 2.5 mg) -Will consider increasing Coreg next days if BP allows -EKG at pain -Continue cardiac monitoring -Supplemental O2 to keep O2 sat >92% -Diet HH carb modified  Acute on Chronic Combined systolic and diastolic HF: Ischemic cardiomyopathy Last Echo 01/2019: EF 30-35%, with impaired relaxation. Also has ruptured chorda of mitral valve This AM he required supplemental nasal O2 for hypoxia at high 80s. Volume up on exam: Has JVD, bibasilar crackle and 1+ pitting LE edema.   -IV Lasix 40 mg once -Will reevaluate volume status and add Lasix accordingly  -Echo is pending -Continue BB -No ACE inhibitor started due to initial soft BP.  -Strict I/O -Weight daily -fluid restriction  Other chronic medical conditions: Hypothyroidism:  -Continue home Levothyroxine  Parkinson: Dementia: -Continue home meds: L-dopa Carbidopa, Donepezil, Razagiline, PRN Zolpidem at night for insomnia  Urinary incontinency:  -Continue home oxybutynin   For questions or updates, please contact [facility] Please  consult [email or web address] for contact info under        Signed, Dewayne Hatch, MD  01/17/2020, 1:47 PM    ATTENDING ATTESTATION  I have seen, examined and evaluated the patient this AM along with Dr. Myrtie Hawk (R2).  After reviewing all the available data and chart, we discussed the patients laboratory, study & physical findings as well as symptoms in detail. I agree with her findings, examination as well as impression recommendations as per our discussion.    Attending adjustments noted in italics.   Tough situation with an elderly gentleman with dementia and severe multivessel CAD presenting now with acute coronary syndrome and EKGs were clear ST elevations, must consider STEMI, however troponin elevation levels are not consistent with STEMI.  He has ongoing chest pain today which could likely be related to extended ischemia as the EKG now has some more lateral extension of the ST elevations.  We will continue to press with medical management.  Also concern for some acute combined systolic and diastolic heart failure. Plan: We will place him on nitroglycerin paste continue IV heparin to complete 72 hours. As needed morphine (Xanax). With concern for some Acute on Chronic Combined CHF component, will give 1 dose of IV Lasix today because of dyspnea and dropping oxygen saturations.  We will switch to oral Lasix tomorrow 40 mg twice daily/ Short course of Plavix  He remains a relatively poor candidate for invasive procedures for multiple different reasons including his dementia which brings up the concern of safety with his intermittent confusion, also extensive disease usually very difficult and complicated to work on.  He is not a good candidate for long-term Plavix.  Plan continues therefore be conservative medical management as per discussion with family.  Patient is DNR/DNI    Glenetta Hew, M.D., M.S. Interventional Cardiologist

## 2020-01-17 NOTE — Progress Notes (Signed)
  Echocardiogram 2D Echocardiogram has been performed.  [name] [name] 01/17/2020, 1:59 PM

## 2020-01-17 NOTE — Progress Notes (Signed)
ANTICOAGULATION CONSULT NOTE   Pharmacy Consult for Heparin Indication: chest pain/ACS  No Known Allergies  Patient Measurements: Height: 5\' 8"  (172.7 cm) Weight: 198 lb 13.7 oz (90.2 kg) IBW/kg (Calculated) : 68.4 Heparin Dosing Weight: TBW  Vital Signs: Temp: 97.6 F (36.4 C) (02/03 0000) Temp Source: Oral (02/02 2008) BP: 125/74 (02/03 0000) Pulse Rate: 68 (02/03 0000)  Labs: Recent Labs    01/16/20 1412 01/16/20 1412 01/16/20 1612 01/16/20 1843 01/16/20 2139 01/16/20 2334  HGB 10.9*  --   --   --   --  10.9*  HCT 32.5*  --   --   --   --  31.4*  PLT 173  --   --   --   --  154  HEPARINUNFRC  --   --   --   --   --  0.30  CREATININE 1.45*  --   --   --   --  1.48*  TROPONINIHS 844*   < > 1,229* 1,193* 1,199*  --    < > = values in this interval not displayed.    Estimated Creatinine Clearance: 45.6 mL/min (A) (by C-G formula based on SCr of 1.48 mg/dL (H)).   Medical History: Past Medical History:  Diagnosis Date  . Anxiety   . CAD (coronary artery disease)    a. 01/2019: cath showing severe multivessel disease with 100% CTO of mid RCA, 50% proximal circumflex with 90% AV groove followed by 100% CTO of LPL (that recanalizes through left left lateral), 90 to 95% bifurcation LAD-2nd Diag followed by 60% and then tandem 99 to 90% lesions in the mid to distal LAD. Not candidate for CABG or PCI. Med management recommended.   . Cardiomyopathy ([place])   . Chronic combined systolic and diastolic CHF (congestive heart failure) ([place])   . Dementia ([place])   . GERD (gastroesophageal reflux disease)   . Hyperlipidemia   . Hypertension   . Incontinence   . 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  . Myocardial infarction ([place])   . Neuromuscular disorder ([place])   . Parkinson's disease ([place])   . Peripheral vascular disease ([place])    Assessment: 62 YOM presenting with CP, hx of CAD, not on anticoagulation PTA.  Chronic anemia stable, plts 173.    2/3 AM  update:  Initial heparin level therapeutic CBC stable   Goal of Therapy:  Heparin level 0.3-0.7 units/ml Monitor platelets by anticoagulation protocol: Yes   Plan:  Cont heparin at 1000 units/hr Confirmatory heparin level with AM labs  Narda Bonds, PharmD, [place] Pharmacist Phone: [phone]

## 2020-01-17 NOTE — Plan of Care (Signed)

## 2020-01-17 NOTE — Progress Notes (Signed)
ANTICOAGULATION CONSULT NOTE   Pharmacy Consult for Heparin Indication: chest pain/ACS  No Known Allergies  Patient Measurements: Height: 5\' 8"  (172.7 cm) Weight: 194 lb 10.7 oz (88.3 kg) IBW/kg (Calculated) : 68.4 Heparin Dosing Weight: TBW  Vital Signs: Temp: 97.5 F (36.4 C) (02/03 0400) Temp Source: Oral (02/03 0400) BP: 123/78 (02/03 0600) Pulse Rate: 61 (02/03 0600)  Labs: Recent Labs    01/16/20 1412 01/16/20 1412 01/16/20 1612 01/16/20 1843 01/16/20 2139 01/16/20 2334 01/17/20 0606  HGB 10.9*  --   --   --   --  10.9*  --   HCT 32.5*  --   --   --   --  31.4*  --   PLT 173  --   --   --   --  154  --   HEPARINUNFRC  --   --   --   --   --  0.30 0.33  CREATININE 1.45*  --   --   --   --  1.48*  --   TROPONINIHS 844*   < > 1,229* 1,193* 1,199*  --   --    < > = values in this interval not displayed.    Estimated Creatinine Clearance: 45.2 mL/min (A) (by C-G formula based on SCr of 1.48 mg/dL (H)).   Medical History: Past Medical History:  Diagnosis Date  . Anxiety   . CAD (coronary artery disease)    a. 01/2019: cath showing severe multivessel disease with 100% CTO of mid RCA, 50% proximal circumflex with 90% AV groove followed by 100% CTO of LPL (that recanalizes through left left lateral), 90 to 95% bifurcation LAD-2nd Diag followed by 60% and then tandem 99 to 90% lesions in the mid to distal LAD. Not candidate for CABG or PCI. Med management recommended.   . Cardiomyopathy ([place])   . Chronic combined systolic and diastolic CHF (congestive heart failure) ([place])   . Dementia ([place])   . GERD (gastroesophageal reflux disease)   . Hyperlipidemia   . Hypertension   . Incontinence   . 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  . Myocardial infarction ([place])   . Neuromuscular disorder ([place])   . Parkinson's disease ([place])   . Peripheral vascular disease ([place])    Assessment: 55 YOM presenting with CP, hx of CAD, not on anticoagulation PTA.  Cardiology is planning medical management, with heparin to continue for 72 hours.  Heparin level remains therapeutic. CBC stable. Noted history of anemia and GIB history, but no current active bleed issues documented.  Goal of Therapy:  Heparin level 0.3-0.7 units/ml Monitor platelets by anticoagulation protocol: Yes   Plan:  Continue heparin at 1000 units/hr Monitor daily heparin level and CBC, s/sx bleeding Cardiology treating with heparin x 72 hours   Elicia Lamp, PharmD, BCPS Please check AMION for all [place] contact numbers Clinical Pharmacist 01/17/2020 8:38 AM

## 2020-01-18 LAB — BASIC METABOLIC PANEL
Anion gap: 10 (ref 5–15)
BUN: 32 mg/dL — ABNORMAL HIGH (ref 8–23)
CO2: 26 mmol/L (ref 22–32)
Calcium: 8.6 mg/dL — ABNORMAL LOW (ref 8.9–10.3)
Chloride: 104 mmol/L (ref 98–111)
Creatinine, Ser: 1.63 mg/dL — ABNORMAL HIGH (ref 0.61–1.24)
GFR calc Af Amer: 46 mL/min — ABNORMAL LOW (ref >60)
GFR calc non Af Amer: 40 mL/min — ABNORMAL LOW (ref >60)
Glucose, Bld: 103 mg/dL — ABNORMAL HIGH (ref 70–99)
Potassium: 3.5 mmol/L (ref 3.5–5.1)
Sodium: 140 mmol/L (ref 135–145)

## 2020-01-18 LAB — CBC
HCT: 30.5 % — ABNORMAL LOW (ref 39.0–52.0)
Hemoglobin: 10.4 g/dL — ABNORMAL LOW (ref 13.0–17.0)
MCH: 32.8 pg (ref 26.0–34.0)
MCHC: 34.1 g/dL (ref 30.0–36.0)
MCV: 96.2 fL (ref 80.0–100.0)
Platelets: 148 10*3/uL — ABNORMAL LOW (ref 150–400)
RBC: 3.17 MIL/uL — ABNORMAL LOW (ref 4.22–5.81)
RDW: 14 % (ref 11.5–15.5)
WBC: 6.8 10*3/uL (ref 4.0–10.5)
nRBC: 0 % (ref 0.0–0.2)

## 2020-01-18 LAB — TROPONIN I (HIGH SENSITIVITY): Troponin I (High Sensitivity): 677 ng/L (ref <18)

## 2020-01-18 LAB — HEPARIN LEVEL (UNFRACTIONATED): Heparin Unfractionated: 0.4 IU/mL (ref 0.30–0.70)

## 2020-01-18 MED ORDER — MORPHINE SULFATE (PF) 2 MG/ML IV SOLN
2.0000 mg | Freq: Once | INTRAVENOUS | Status: AC
  Administered 2020-01-18: 2 mg via INTRAVENOUS
  Filled 2020-01-18: qty 1

## 2020-01-18 MED ORDER — MORPHINE SULFATE (PF) 2 MG/ML IV SOLN
2.0000 mg | INTRAVENOUS | Status: DC | PRN
  Administered 2020-01-18 – 2020-01-21 (×4): 2 mg via INTRAVENOUS
  Filled 2020-01-18 (×5): qty 1

## 2020-01-18 MED ORDER — AMLODIPINE BESYLATE 10 MG PO TABS
10.0000 mg | ORAL_TABLET | Freq: Every day | ORAL | Status: DC
  Administered 2020-01-19 – 2020-01-25 (×7): 10 mg via ORAL
  Filled 2020-01-18 (×7): qty 1

## 2020-01-18 MED ORDER — POTASSIUM CHLORIDE CRYS ER 20 MEQ PO TBCR
40.0000 meq | EXTENDED_RELEASE_TABLET | ORAL | Status: AC
  Administered 2020-01-18 (×2): 40 meq via ORAL
  Filled 2020-01-18 (×2): qty 2

## 2020-01-18 MED ORDER — RANOLAZINE ER 500 MG PO TB12
500.0000 mg | ORAL_TABLET | Freq: Two times a day (BID) | ORAL | Status: DC
  Administered 2020-01-18 – 2020-01-25 (×15): 500 mg via ORAL
  Filled 2020-01-18 (×15): qty 1

## 2020-01-18 NOTE — Progress Notes (Signed)
ANTICOAGULATION CONSULT NOTE   Pharmacy Consult for Heparin Indication: chest pain/ACS  No Known Allergies  Patient Measurements: Height: 5\' 8"  (172.7 cm) Weight: 194 lb 10.7 oz (88.3 kg) IBW/kg (Calculated) : 68.4 Heparin Dosing Weight: TBW  Vital Signs: Temp: 98 F (36.7 C) (02/04 0742) Temp Source: Oral (02/04 0742) BP: 119/76 (02/04 0742) Pulse Rate: 80 (02/04 0742)  Labs: Recent Labs    01/16/20 1412 01/16/20 1412 01/16/20 1612 01/16/20 1843 01/16/20 2139 01/16/20 2334 01/17/20 0606 01/18/20 0431  HGB 10.9*   < >  --   --   --  10.9*  --  10.4*  HCT 32.5*  --   --   --   --  31.4*  --  30.5*  PLT 173  --   --   --   --  154  --  148*  HEPARINUNFRC  --   --   --   --   --  0.30 0.33 0.40  CREATININE 1.45*  --   --   --   --  1.48*  --  1.63*  TROPONINIHS 844*   < > 1,229* 1,193* 1,199*  --   --   --    < > = values in this interval not displayed.    Estimated Creatinine Clearance: 41 mL/min (A) (by C-G formula based on SCr of 1.63 mg/dL (H)).   Medical History: Past Medical History:  Diagnosis Date  . Anxiety   . CAD (coronary artery disease)    a. 01/2019: cath showing severe multivessel disease with 100% CTO of mid RCA, 50% proximal circumflex with 90% AV groove followed by 100% CTO of LPL (that recanalizes through left left lateral), 90 to 95% bifurcation LAD-2nd Diag followed by 60% and then tandem 99 to 90% lesions in the mid to distal LAD. Not candidate for CABG or PCI. Med management recommended.   . Cardiomyopathy ([place])   . Chronic combined systolic and diastolic CHF (congestive heart failure) ([place])   . Dementia ([place])   . GERD (gastroesophageal reflux disease)   . Hyperlipidemia   . Hypertension   . Incontinence   . 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  . Myocardial infarction ([place])   . Neuromuscular disorder ([place])   . Parkinson's disease ([place])   . Peripheral vascular disease ([place])    Assessment: 89 YOM presenting with  CP, hx of CAD, not on anticoagulation PTA. Cardiology is planning medical management, with heparin to continue for 72 hours.  Heparin level remains at goal. CBC stable. Noted history of anemia and GIB history, but no current active bleed issues documented.  Goal of Therapy:  Heparin level 0.3-0.7 units/ml Monitor platelets by anticoagulation protocol: Yes   Plan:  Continue heparin at 1000 units/hr Monitor daily heparin level and CBC, s/sx bleeding Cardiology treating with heparin x 72 hours  Erin Hearing PharmD., BCPS Clinical Pharmacist 01/18/2020 7:52 AM

## 2020-01-18 NOTE — Significant Event (Signed)
Rapid Response Event Note  Overview:Called d/t 8/10 chest pain unrelieved by 3mg  morphine and 1 inch nitropaste. Pt here for chest pain/STEMI. Per notes, pt not a candidate for interventions and is being medically managed. Time Called: 0048 Arrival Time: [id number] Event Type: Cardiac  Initial Focused Assessment: Pt laying in bed, alert but confused, skin warm and slightly diaphoretic. Pt saying he is having 8/10 pain in chest and is asking for a doctor. Pt will close eyes when not stimulated, however will quickly wake up again and c/o chest pain. Lungs clear t/o. T-98.2, HR-85, BP-114/73, RR-14, SpO2-96% on 4L [place].   Interventions: Given PTA RRT: 1mg  morphine  2mg  morphine 1 inch nitropaste  Dr. Paticia Stack notified of unrelieved chest pain-NTG SL and an additional 2mg  morphine ordered.  2 SL NTG given-pt now sleeping.     Plan of Care (if not transferred): Continue to monitor pt. Call RRT if further assistance needed.   Event Summary: Name of Physician Notified: Paticia Stack, MD at (PTA RRT)    at          [place], Carren Rang

## 2020-01-18 NOTE — Progress Notes (Addendum)
Pt c/o chest pain radiating down both arms. PRN morphine given and scheduled nitro patch applied with no relief. EKG done and on call provider paged. Verbal order received and placed for 2 mg of morphine. VS stable. Will give and continue to monitor the patient.

## 2020-01-18 NOTE — Progress Notes (Addendum)
Progress Note  Patient Name: [name] Date of Encounter: 01/18/2020  Primary Cardiologist: Carlyle Dolly, MD   Patient Profile:   [name] a 79 y.o.malewith a hx of combined systolic and diastolic CHF (EF [id number] echo 02/2019), ICM, NSTEMI 01/2019 w/ med rx for multi-vessel CAD, HTN, HLD, GERD, ruptured MV chordae, Parkinson's, dementia, thoracic aortic aneurysm,who presented with substernal chest pain that radiated to his arm. today for the evaluation ofchest pain. Code STEMI called but canceled then. His EKG changes and elevated trop is now consistent with STEMI definition, however Trope elevation is not as high as expected with current extensive EKG changes. Not a candidate for intervention due to comorbidities and is getting medical management  Subjective   Patient was seen and evaluated at bedside this morning rounds. He is sitting in bed comfortable and eating breakfast but states that his shoulder hurts a little bit. No other acute complaints. He had chest pain last night,improved with 2 doses of Morphine and Xanaz, and nitroglycerin.    Inpatient Medications    Scheduled Meds: . acidophilus  1 capsule Oral BID  . amLODipine  7.5 mg Oral Daily  . atorvastatin  40 mg Oral q1800  . carbidopa-levodopa  1 tablet Oral BID  . carvedilol  12.5 mg Oral BID WC  . clopidogrel  75 mg Oral Daily  . donepezil  10 mg Oral QHS  . DULoxetine  60 mg Oral QHS  . ferrous sulfate  325 mg Oral Q breakfast  . folic acid  1 mg Oral QHS  . furosemide  40 mg Oral BID  . hydrOXYzine  25 mg Oral TID  . levothyroxine  100 mcg Oral QAC breakfast  . multivitamin with minerals  1 tablet Oral q morning - 10a  . nitroGLYCERIN  1 inch Topical Q6H  . oxybutynin  5 mg Oral BID  . rasagiline  1 mg Oral Daily  . simethicone  80 mg Oral BID  . sodium chloride flush  3 mL Intravenous Q12H   Continuous Infusions: . sodium chloride    . heparin 1,000 Units/hr (01/16/20 1500)   PRN  Meds: sodium chloride, acetaminophen, ALPRAZolam, morphine injection, nitroGLYCERIN, ondansetron (ZOFRAN) IV, sodium chloride flush, triamcinolone ointment, zolpidem   Vital Signs    Vitals:   01/18/20 0315 01/18/20 0316 01/18/20 0317 01/18/20 0319  BP:  126/69    Pulse: 73 77 81   Resp: 12 11 13    Temp:   98.4 F (36.9 C)   TempSrc:   Oral   SpO2: 95% 98% 98%   Weight:    88.3 kg  Height:        Intake/Output Summary (Last 24 hours) at 01/18/2020 0717 Last data filed at 01/18/2020 0318 Gross per 24 hour  Intake 696 ml  Output 2625 ml  Net -1929 ml   Last 3 Weights 01/18/2020 01/17/2020 01/16/2020  Weight (lbs) 194 lb 10.7 oz 194 lb 10.7 oz 198 lb 13.7 oz  Weight (kg) 88.3 kg 88.3 kg 90.2 kg      Telemetry    No arrythmia, pauses or other new abnormality. Prior ST elevation present - Personally Reviewed  ECG    ST elevation in inferior and anterolateral leads - Personally Reviewed  Physical Exam   GEN: No acute distress.   Neck: Has JVD (improved today) Cardiac: RRR, systolic murmur at M area (ADDENDUM from last progress note : Murmur was present on exam since admission but not mentioned on last progress note) Respiratory:  On 4 li nasal canula, no respiratory distress. No crackle GI: Non-distended  MS: No edema; No deformity. Neuro:  Nonfocal, alert, oriented to place and person Psych: Normal affect   Labs    High Sensitivity Troponin:   Recent Labs  Lab 01/16/20 1412 01/16/20 1612 01/16/20 1843 01/16/20 2139  TROPONINIHS 844* 1,229* 1,193* 1,199*      Chemistry Recent Labs  Lab 01/16/20 1412 01/16/20 2334 01/18/20 0431  NA 137 137 140  K 4.2 4.1 3.5  CL 106 104 104  CO2 22 23 26   GLUCOSE 141* 133* 103*  BUN 33* 33* 32*  CREATININE 1.45* 1.48* 1.63*  CALCIUM 8.9 8.8* 8.6*  PROT 7.0  --   --   ALBUMIN 3.5  --   --   AST 28  --   --   ALT 12  --   --   ALKPHOS 79  --   --   BILITOT 0.5  --   --   GFRNONAA 46* 45* 40*  GFRAA 53* 52* 46*  ANIONGAP  9 10 10      Hematology Recent Labs  Lab 01/16/20 1412 01/16/20 2334 01/18/20 0431  WBC 6.7 6.0 6.8  RBC 3.32* 3.29* 3.17*  HGB 10.9* 10.9* 10.4*  HCT 32.5* 31.4* 30.5*  MCV 97.9 95.4 96.2  MCH 32.8 33.1 32.8  MCHC 33.5 34.7 34.1  RDW 14.2 14.1 14.0  PLT 173 154 148*    BNPNo results for input(s): BNP, PROBNP in the last 168 hours.   DDimer No results for input(s): DDIMER in the last 168 hours.   Radiology    DG Chest Port 1 View  Result Date: 01/16/2020 CLINICAL DATA:  Onset chest pain radiating into the left arm today. EXAM: PORTABLE CHEST 1 VIEW COMPARISON:  Single-view of the chest 07/21/2019. CT chest 02/09/2019. FINDINGS: Scattered bilateral punctate granulomata are again. Heart size is normal seen. No pneumothorax. Lungs are otherwise clear or pleural effusion. No acute or focal bony abnormality. IMPRESSION: No acute disease. Electronically Signed   By: Inge Rise M.D.   On: 01/16/2020 14:53   ECHOCARDIOGRAM COMPLETE  Result Date: 01/17/2020   ECHOCARDIOGRAM REPORT   Patient Name:   [name] Date of Exam: 01/17/2020 Medical Rec #:  [id number]     Height:       68.0 in Accession #:    [id number]    Weight:       194.7 lb Date of Birth:  Sep 07, 1941    BSA:          2.02 m Patient Age:    30 years      BP:           121/67 mmHg Patient Gender: M             HR:           78 bpm. Exam Location:  Inpatient Procedure: 2D Echo, Intracardiac Opacification Agent, Cardiac Doppler and Color            Doppler Indications:    R07.9* Chest pain, unspecified  History:        Patient has prior history of Echocardiogram examinations, most                 recent 02/12/2019. CHF and Cardiomyopathy, CAD and Previous                 Myocardial Infarction, Mitral Valve Disease; Risk  Factors:Dyslipidemia. Parkinson's Disease. GERD.  Sonographer:    Jonelle Sidle Dance Referring Phys: 21 [place]  1. Left ventricular ejection fraction, by visual estimation, is 35  to 40%. The left ventricle has low normal function. Left ventricular septal wall thickness was moderately increased. There is moderately increased left ventricular hypertrophy.  2. The left ventricle demonstrates regional wall motion abnormalities. Apical akinesis with midseptal hypokinesis. No significant change in regional wall motion abnormalities compared to exam from 02/12/2019. Side by side comparison of images performed.  3. Calcifed, mobile mass in the left ventricle. This has been previously documented on echocardiograms serially, and is felt to represent a ruptured chordae tendinae.  4. Global right ventricle has normal systolic function.The right ventricular size is normal. No increase in right ventricular wall thickness.  5. Left atrial size was normal.  6. Right atrial size was normal.  7. Mild mitral annular calcification.  8. The mitral valve is normal in structure. Mild mitral valve regurgitation. No evidence of mitral stenosis.  9. The tricuspid valve is normal in structure. Tricuspid valve regurgitation is not demonstrated. 10. The aortic valve was not well visualized. Aortic valve regurgitation is mild. Mild to moderate aortic valve sclerosis/calcification without any evidence of aortic stenosis. 11. The pulmonic valve was not well visualized. Pulmonic valve regurgitation is trivial. 12. The inferior vena cava is normal in size with greater than 50% respiratory variability, suggesting right atrial pressure of 3 mmHg. 13. There is borderline dilatation of the aortic root measuring 39 mm. FINDINGS  Left Ventricle: Left ventricular ejection fraction, by visual estimation, is 35 to 40%. The left ventricle has low normal function. Definity contrast agent was given IV to delineate the left ventricular endocardial borders. The left ventricle demonstrates regional wall motion abnormalities. There is moderately increased left ventricular hypertrophy. Asymmetric left ventricular hypertrophy. Left ventricular  diastolic parameters are indeterminate. Right Ventricle: The right ventricular size is normal. No increase in right ventricular wall thickness. Global RV systolic function is has normal systolic function. Left Atrium: Left atrial size was normal in size. Right Atrium: Right atrial size was normal in size Pericardium: There is no evidence of pericardial effusion. Mitral Valve: The mitral valve is normal in structure. Mild mitral annular calcification. Mild mitral valve regurgitation. No evidence of mitral valve stenosis by observation. Calcifed, mobile mass in the left ventricle. This has been previously documented on echocardiograms serially, and is felt to represent a ruptured chordae tendinae. Tricuspid Valve: The tricuspid valve is normal in structure. Tricuspid valve regurgitation is not demonstrated. Aortic Valve: The aortic valve was not well visualized. . There is moderate thickening and moderate calcification of the aortic valve. Aortic valve regurgitation is mild. Aortic regurgitation PHT measures 542 msec. Mild to moderate aortic valve sclerosis/calcification is present, without any evidence of aortic stenosis. There is moderate thickening of the aortic valve. There is moderate calcification of the aortic valve. Pulmonic Valve: The pulmonic valve was not well visualized. Pulmonic valve regurgitation is trivial. Pulmonic regurgitation is trivial. Aorta: The aortic root, ascending aorta and aortic arch are all structurally normal, with no evidence of dilitation or obstruction. There is borderline dilatation of the aortic root measuring 39 mm. Venous: The inferior vena cava is normal in size with greater than 50% respiratory variability, suggesting right atrial pressure of 3 mmHg. IAS/Shunts: The interatrial septum was not well visualized.  LEFT VENTRICLE PLAX 2D LVIDd:         4.03 cm  Diastology LVIDs:  3.12 cm  LV e' lateral:   9.14 cm/s LV PW:         1.11 cm  LV E/e' lateral: 9.1 LV IVS:         1.66 cm  LV e' medial:    4.90 cm/s LVOT diam:     1.70 cm  LV E/e' medial:  17.1 LV SV:         33 ml LV SV Index:   15.73 LVOT Area:     2.27 cm  RIGHT VENTRICLE            IVC RV Basal diam:  2.26 cm    IVC diam: 1.91 cm RV S prime:     8.59 cm/s TAPSE (M-mode): 1.5 cm LEFT ATRIUM             Index       RIGHT ATRIUM           Index LA diam:        3.60 cm 1.78 cm/m  RA Area:     15.00 cm LA Vol (A2C):   53.6 ml 26.53 ml/m RA Volume:   33.40 ml  16.53 ml/m LA Vol (A4C):   70.3 ml 34.80 ml/m LA Biplane Vol: 62.2 ml 30.79 ml/m  AORTIC VALVE LVOT Vmax:   113.90 cm/s LVOT Vmean:  74.600 cm/s LVOT VTI:    0.220 m AI PHT:      542 msec  AORTA Ao Root diam: 3.90 cm Ao Asc diam:  3.70 cm MITRAL VALVE MV Area (PHT): 2.73 cm              SHUNTS MV PHT:        80.62 msec            Systemic VTI:  0.22 m MV Decel Time: 278 msec              Systemic Diam: 1.70 cm MV E velocity: 83.60 cm/s  103 cm/s MV A velocity: 150.00 cm/s 70.3 cm/s MV E/A ratio:  0.56        1.5  Cherlynn Kaiser MD Electronically signed by Cherlynn Kaiser MD Signature Date/Time: 01/17/2020/8:25:24 PM    Final     Cardiac Studies   ECHO 01/17/2020: IMPRESSIONS   1. Left ventricular ejection fraction, by visual estimation, is 35 to  40%. The left ventricle has low normal function. Left ventricular septal  wall thickness was moderately increased. There is moderately increased  left ventricular hypertrophy.  2. The left ventricle demonstrates regional wall motion abnormalities.  Apical akinesis with midseptal hypokinesis. No significant change in  regional wall motion abnormalities compared to exam from 02/12/2019. Side by  side comparison of images performed.  3. Calcifed, mobile mass in the left ventricle. This has been previously  documented on echocardiograms serially, and is felt to represent a  ruptured chordae tendinae.  4. Global right ventricle has normal systolic function.The right  ventricular size is normal. No increase  in right ventricular wall  thickness.  5. Left atrial size was normal.  6. Right atrial size was normal.  7. Mild mitral annular calcification.  8. The mitral valve is normal in structure. Mild mitral valve  regurgitation. No evidence of mitral stenosis.  9. The tricuspid valve is normal in structure. Tricuspid valve  regurgitation is not demonstrated.  10. The aortic valve was not well visualized. Aortic valve regurgitation  is mild. Mild to moderate aortic valve sclerosis/calcification without any  evidence of aortic  stenosis.  11. The pulmonic valve was not well visualized. Pulmonic valve  regurgitation is trivial.  12. The inferior vena cava is normal in size with greater than 50%  respiratory variability, suggesting right atrial pressure of 3 mmHg.  13. There is borderline dilatation of the aortic root measuring 39 mm.   ECHO:02/12/2019 1. The left ventricle has moderate-severely reduced systolic function,  with an ejection fraction of 30-35%. The cavity size was normal. There is  asymmetric septal hypertrophy.. Left ventricular diastolic Doppler  parameters are consistent with impaired  relaxation Elevated mean left atrial pressure.  2. The right ventricle has normal systolic function. The cavity was  normal. There is no increase in right ventricular wall thickness.  3. The aortic valve has an indeterminant number of cusps Moderate  thickening of the aortic valve Moderate calcification of the aortic valve.  Aortic valve regurgitation is mild to moderate by color flow Doppler. mild  stenosis of the aortic valve.  Moderate aortic annular calcification noted.  4. There is a mobile density within the MV apparatus that appears to be a  rupture chordae. Mild thickening of the mitral valve leaflet. Mild  calcification of the mitral valve leaflet. There is mild mitral annular  calcification present. No evidence of  mitral valve stenosis.  5. The tricuspid valve is normal  in structure.  6. The aortic root is normal in size and structure.  7. There is moderate dilatation of the aortic root measuring 41 mm.  8. Moderate hypokinesis of the left ventricular inferior wall.  9. Severe akinesis of the left ventricular anteroseptal wall.  10. Severe akinesis of the left ventricular apical segment.   CATH:02/10/2019  RCA Prox RCA lesion is 50% stenosed. Prox RCA to Mid RCA lesion is 80% stenosed. Mid RCA lesion is 90% stenosed. Mid RCA to Dist RCA lesion is 100% stenosed.  ----- Part of the PDA and PL system was filled via right to right and left-to-right collaterals.  Cx-OM: Ost Cx to Prox Cx lesion is 45% stenosed with 90% stenosed side branch in LAV Groove.  ------ 3rd LPL-1 lesion is 80% stenosed. 3rd LPL-2 lesion is 99% stenosed.  Prox LAD to Mid LAD lesion is 90% stenosed with 55% stenosed side branch in Ost 2nd Diag.  Mid-distal LAD to Dist LAD lesion is 95% stenosed. Dist LAD lesion is 90% stenosed.  --  LV end diastolic pressure is moderately elevated. Consistent with acute on chronic diastolic heart failure  SUMMARY  Severe multivessel disease: 100% CTO of mid RCA, 50% proximal circumflex with 90% AV groove followed by 100% CTO of LPL (that recanalizes through left left lateral), 90 to 95% bifurcation LAD-2nd Diag followed by 60% and then tandem 99 to 90% lesions in the mid to distal LAD. -- >Minimal good PCI or CABG options  Moderately elevated LVEDP consistent with acute diastolic heart failure  RECOMMENDATIONS  The patient will be transferred to a telemetry bed. I plan to restart IV heparin to run for 48 hours for MI. (Will restart 8 hours post sheath removal)  Continue aggressive medical management.  I discussed the findings with the patient's power of attorney/brother and sister-in-law: Options going forward would be medical management, CABG, random PCI. After discussing with the brother: ? It is clear that PCI is not likely a  good option as it would not be a good candidate for dual antiplatelet therapy with frequent falls, he is also was anemic.  ? CABG would not be something that they  would be interested in offering based on his dementia, renal insufficiency and Parkinson's. Would not likely recover.  ? Best option going forward would be medical management.   With renal insufficiency, we will hydrate him, but he would likely require diuresis and standing diuretic during this hospitalization.  2D echo was ordered to better assess his EF, however I suspect it to be reduced. t    Patient Profile     79 y.o. male  with a hx of combined systolic and diastolic CHF (EF [id number] echo 02/2019), ICM, NSTEMI 01/2019 w/ med rx for multi-vessel CAD, HTN, HLD, GERD, ruptured MV chordae, Parkinson's, dementia, thoracic aortic aneurysm,who presented with substernal chest pain that radiated to his arm.  today for the evaluation ofchest pain. Code STEMI called but canceled then. His EKG changes and elevated trop is now consistent with STEMI definition, however Trope elevation is not as high as expected with current extensive EKG changes. Not a candidate for intervention due to comorbidities and is getting medical management.   Assessment & Plan   STEMI/UA: Patient with Hx of CAD, NSTEMI, sever multivessel disease, PVD, thorasic aorta aneurysm, dementia, parkinson, Hx of falls, Preseneted with chest pain with radiation to arm. EKG with ST elevation at inferior leads and Trop at 844>1229>1193>1199.--Recheck today was 677 Code STEMI called but canceled later. His EKG with ST elevation to lateral and anteroseptal leads and inferior lead involevment.  His EKG changes and elevated trop is consistent with STEMI definition (due to LAD blockage) and ischemia in the territories of all branches as well as inferior ischemia, however troponin elevation levels are not consistent with STEMI. He is a poor candidate for intervention given  old age, comorbidities: dementia, parkinson, Hx of anemia and high risk for falls and bleeding (and not a good candidate for long term DAP after PCI/stent)  He continues to have intermittent chest pain, required Morphine, xanax and nitro paste for pain control last night. -->  Despite recurrent pain, troponin levels now trending down.  Will increase antianginal meds as below:  -Ranexa 500 mg BID -started today -Increase Amlodipine tomorrow to 10 mg QD (SBP ranged between at 100s-120s. Has some room to go up on Amlodipine (for antianginal effect).  -IncreasingPRN Morphine for uncontrolled chest pain to 2 mg q2h, but recommended RN to avoid frequent administration as much as possible)  -Continue Nitropaste -Continue ASA and Plavix once yesterday) -Continue IV Heparin -May consider resumin Imdur after discharge -Continue Coreg 12.5 mg BID -EKG at pain -Continue cardiac monitoring -Supplemental O2 to keep O2 sat >92% -Diet HH carb modified -Trend Trop -Up with assistance (not earlier than 2 hours after morphine administration and when fully awake)   Interesting that he has these intermittent episodes of chest pain I suspect that he may be having some mild progression of the occlusion of his LAD where he is now walking off some side branches that would correlate with his chest pain.  Would continue IV heparin at least until tomorrow, but if still having chest pain probably 1 more day.  No plans for any further invasive evaluation.  We will update the family.  Combined systolic and diastolic [id number] cardiomyopathy  Echo 01/17/2020 perorformed. EF 35-40%. Regional wall motion abnormality that is suggestive of LAD involvement, reported no change compairing to prior study 2020  Interestingly, despite new troponin elevations and EKG changes, EF appears to be relatively stable  Net -1929 yesterday  On 4 li nasal O2, O2 sat stable. Volume status improved today  but still needs diuresis.  -  Continue PO Lasix 40 mg BID --> chest x-ray on admission did not show significant pulmonary edema.  Lungs are relatively clear. -BMP daily -Replace electrolyte as needed -Continue Coreg - No ACE inhibitor until BP allows -Strict I/O -Weight daily -fluid restriction  Other chronic medical conditions: Hypothyroidism:  -Continue home Levothyroxine  Parkinson: Dementia: -Continue home meds: L-dopa Carbidopa, Donepezil, Razagiline, PRN Zolpidem at night for insomnia  This clearly as another level of complexity to his care as he intermittently has spells of lucidity worried will feel chest pain, but otherwise is confused.  Urinary incontinency:  -Continue home oxybutynin  For questions or updates, please contact [place] Please consult [email or web address] for contact info under        Signed, Dewayne Hatch, MD  01/18/2020, 7:17 AM     ATTENDING ATTESTATION  I have seen, examined and evaluated the patient this AM along with Dr. Myrtie Hawk.  After reviewing all the available data and chart, we discussed the patients laboratory, study & physical findings as well as symptoms in detail. I agree with her findings, examination as well as impression recommendations as per our discussion.    Attending adjustments noted in italics.   Challenging situation of this elderly gentleman who has significant CAD.  We are treating him medically through what appears to be an anterior "STEMI "with troponin elevations only consistent with non-STEMI physiology.  I suspect that he is intermittently occluding side branch vessels because of the have pain.  Plan is to continue aggressive antianginal regimen with as needed analgesics.  I do think that short-term anxiolytics are also helpful when he has episodes that almost seem to be more panic attack related.  Hopefully will be to stabilize his symptoms over the next day or so and and have him work towards discharge.  Best case scenario would be that he  be discharged after weekend.    Glenetta Hew, M.D., M.S. Interventional Cardiologist   Pager # [phone] Phone # [phone] [address] [place], [place] [address]

## 2020-01-18 NOTE — Progress Notes (Signed)
Pt continues to c/o 8/10. Rapid Response notified and MD paged. MD stated to give SL nitro followed by  2 mg of morphine.   After nitro patient resting quietly.   Patient back awake and complaining of chest pain. Additional dose of morphine given.

## 2020-01-18 NOTE — Plan of Care (Signed)
  Problem: Education: Goal: Ability to verbalize understanding of medication therapies will improve Outcome: Progressing   Problem: Cardiac: Goal: Ability to achieve and maintain adequate cardiopulmonary perfusion will improve Outcome: Progressing   Problem: Education: Goal: Knowledge of General Education information will improve Description: Including pain rating scale, medication(s)/side effects and non-pharmacologic comfort measures Outcome: Progressing

## 2020-01-19 LAB — BASIC METABOLIC PANEL
Anion gap: 10 (ref 5–15)
BUN: 32 mg/dL — ABNORMAL HIGH (ref 8–23)
CO2: 24 mmol/L (ref 22–32)
Calcium: 9 mg/dL (ref 8.9–10.3)
Chloride: 104 mmol/L (ref 98–111)
Creatinine, Ser: 1.46 mg/dL — ABNORMAL HIGH (ref 0.61–1.24)
GFR calc Af Amer: 53 mL/min — ABNORMAL LOW (ref >60)
GFR calc non Af Amer: 46 mL/min — ABNORMAL LOW (ref >60)
Glucose, Bld: 124 mg/dL — ABNORMAL HIGH (ref 70–99)
Potassium: 4.2 mmol/L (ref 3.5–5.1)
Sodium: 138 mmol/L (ref 135–145)

## 2020-01-19 LAB — CBC
HCT: 32.1 % — ABNORMAL LOW (ref 39.0–52.0)
Hemoglobin: 10.8 g/dL — ABNORMAL LOW (ref 13.0–17.0)
MCH: 32.5 pg (ref 26.0–34.0)
MCHC: 33.6 g/dL (ref 30.0–36.0)
MCV: 96.7 fL (ref 80.0–100.0)
Platelets: 158 10*3/uL (ref 150–400)
RBC: 3.32 MIL/uL — ABNORMAL LOW (ref 4.22–5.81)
RDW: 14 % (ref 11.5–15.5)
WBC: 7 10*3/uL (ref 4.0–10.5)
nRBC: 0 % (ref 0.0–0.2)

## 2020-01-19 LAB — HEPARIN LEVEL (UNFRACTIONATED): Heparin Unfractionated: 0.38 IU/mL (ref 0.30–0.70)

## 2020-01-19 MED ORDER — ENOXAPARIN SODIUM 40 MG/0.4ML ~~LOC~~ SOLN
40.0000 mg | SUBCUTANEOUS | Status: DC
  Administered 2020-01-19 – 2020-01-24 (×6): 40 mg via SUBCUTANEOUS
  Filled 2020-01-19 (×6): qty 0.4

## 2020-01-19 MED ORDER — ISOSORBIDE DINITRATE 10 MG PO TABS
20.0000 mg | ORAL_TABLET | Freq: Three times a day (TID) | ORAL | Status: DC
  Administered 2020-01-19 – 2020-01-20 (×6): 20 mg via ORAL
  Filled 2020-01-19 (×6): qty 2

## 2020-01-19 NOTE — Progress Notes (Signed)
ANTICOAGULATION CONSULT NOTE   Pharmacy Consult for Heparin Indication: chest pain/ACS  No Known Allergies  Patient Measurements: Height: 5\' 8"  (172.7 cm) Weight: 193 lb 5.5 oz (87.7 kg) IBW/kg (Calculated) : 68.4 Heparin Dosing Weight: TBW  Vital Signs: Temp: 98 F (36.7 C) (02/05 0342) Temp Source: Oral (02/05 0342) BP: 131/70 (02/05 0342) Pulse Rate: 73 (02/05 0344)  Labs: Recent Labs    01/16/20 1412 01/16/20 1843 01/16/20 2139 01/16/20 2334 01/16/20 2334 01/17/20 0606 01/18/20 0431 01/18/20 1045 01/19/20 0434  HGB   < >  --   --  10.9*   < >  --  10.4*  --  10.8*  HCT   < >  --   --  31.4*  --   --  30.5*  --  32.1*  PLT   < >  --   --  154  --   --  148*  --  158  HEPARINUNFRC  --   --   --  0.30   < > 0.33 0.40  --  0.38  CREATININE   < >  --   --  1.48*  --   --  1.63*  --  1.46*  TROPONINIHS  --  1,193* 1,199*  --   --   --   --  677*  --    < > = values in this interval not displayed.    Estimated Creatinine Clearance: 45.6 mL/min (A) (by C-G formula based on SCr of 1.46 mg/dL (H)).   Medical History: Past Medical History:  Diagnosis Date  . Anxiety   . CAD (coronary artery disease)    a. 01/2019: cath showing severe multivessel disease with 100% CTO of mid RCA, 50% proximal circumflex with 90% AV groove followed by 100% CTO of LPL (that recanalizes through left left lateral), 90 to 95% bifurcation LAD-2nd Diag followed by 60% and then tandem 99 to 90% lesions in the mid to distal LAD. Not candidate for CABG or PCI. Med management recommended.   . Cardiomyopathy ([place])   . Chronic combined systolic and diastolic CHF (congestive heart failure) ([place])   . Dementia ([place])   . GERD (gastroesophageal reflux disease)   . Hyperlipidemia   . Hypertension   . Incontinence   . 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  . Myocardial infarction ([place])   . Neuromuscular disorder ([place])   . Parkinson's disease ([place])   . Peripheral vascular disease  ([place])    Assessment: [name] presenting with CP, hx of CAD, not on anticoagulation PTA. Cardiology is planning medical management, with heparin to continue for 72 hours.  Heparin level continues to be at goal. CBC stable. Noted history of anemia and GIB history, but no current active bleed issues documented.  Goal of Therapy:  Heparin level 0.3-0.7 units/ml Monitor platelets by anticoagulation protocol: Yes   Plan:  Continue heparin at 1000 units/hr Monitor daily heparin level and CBC, s/sx bleeding Heparin to stop today  Erin Hearing PharmD., BCPS Clinical Pharmacist 01/19/2020 8:11 AM

## 2020-01-19 NOTE — Progress Notes (Signed)
Heparin gtt stopped @ 1024, confirmed with pharmacy before stopping infusion. Pharmacy confirmed patient will be transition to po anticoagulants.

## 2020-01-19 NOTE — Progress Notes (Addendum)
Progress Note  Patient Name: Lochlin Sacco Date of Encounter: 01/19/2020  Primary Cardiologist: Carlyle Dolly, MD    Patient Profile:   Paulie Astin a 79 y.o.malewith a hx ofcombined systolic and diastolicCHF(EF [id number]), ICM,NSTEMI 01/2019 w/ med rx for multi-vessel CAD, HTN, HLD, GERD, ruptured MV chordae, Parkinson's, dementia, thoracic aortic aneurysm,whopresented with substernal chest pain that radiated to his arm. ST elevation on inf and ant leads. Code STEMI called but canceled then. His EKG changes and elevated trop is now consistent with STEMI definition, however Trope elevation is not as high as expected with current extensive EKG changes. He has not been a candidate for intervention due to comorbidities and is getting medical management. Had some continuous chest pain in first 48 hours but Troponin trended down. 01/19/2020: Now done with 72 h of Heparin. Chest pain improved.  Subjective   Mr. Plett is doing much better this morning. He does not complain of any chest pain. No acute event overnight.   Inpatient Medications    Scheduled Meds: . acidophilus  1 capsule Oral BID  . amLODipine  10 mg Oral Daily  . atorvastatin  40 mg Oral q1800  . carbidopa-levodopa  1 tablet Oral BID  . carvedilol  12.5 mg Oral BID WC  . clopidogrel  75 mg Oral Daily  . donepezil  10 mg Oral QHS  . DULoxetine  60 mg Oral QHS  . enoxaparin (LOVENOX) injection  40 mg Subcutaneous Q24H  . ferrous sulfate  325 mg Oral Q breakfast  . folic acid  1 mg Oral QHS  . furosemide  40 mg Oral BID  . hydrOXYzine  25 mg Oral TID  . isosorbide dinitrate  20 mg Oral TID  . levothyroxine  100 mcg Oral QAC breakfast  . multivitamin with minerals  1 tablet Oral q morning - 10a  . oxybutynin  5 mg Oral BID  . ranolazine  500 mg Oral BID  . rasagiline  1 mg Oral Daily  . simethicone  80 mg Oral BID  . sodium chloride flush  3 mL Intravenous Q12H   Continuous Infusions: . sodium  chloride    . heparin 1,000 Units/hr (01/18/20 1623)   PRN Meds: sodium chloride, acetaminophen, ALPRAZolam, morphine injection, nitroGLYCERIN, ondansetron (ZOFRAN) IV, sodium chloride flush, triamcinolone ointment, zolpidem   Vital Signs    Vitals:   01/19/20 0344 01/19/20 0827 01/19/20 0907 01/19/20 0908  BP:   134/80   Pulse: 73   84  Resp: (!) 21     Temp:  98.5 F (36.9 C)    TempSrc:  Oral    SpO2: 95%  92%   Weight:      Height:        Intake/Output Summary (Last 24 hours) at 01/19/2020 0957 Last data filed at 01/19/2020 [id number] Gross per 24 hour  Intake 1222 ml  Output 1565 ml  Net -343 ml   Last 3 Weights 01/19/2020 01/18/2020 01/17/2020  Weight (lbs) 193 lb 5.5 oz 194 lb 10.7 oz 194 lb 10.7 oz  Weight (kg) 87.7 kg 88.3 kg 88.3 kg      Telemetry    No acute event, no arrhythmia  - Personally Reviewed  ECG    ST elevation (no worsening) of anterolateral and inferior leads - Personally Reviewed  Physical Exam   GEN: No acute distress.   Neck:  JVD (improved) Cardiac: RRR, systolic murmur at M area, rubs, or gallops.  Respiratory: On 2 li nasal O2, Clear  to auscultation bilaterally. GI: Soft, nontender, non-distended  MS: No edema; No deformity. Neuro:  Nonfocal  Psych: Normal affect   Labs    High Sensitivity Troponin:   Recent Labs  Lab 01/16/20 1412 01/16/20 1612 01/16/20 1843 01/16/20 2139 01/18/20 1045  TROPONINIHS 844* 1,229* 1,193* 1,199* 677*      Chemistry Recent Labs  Lab 01/16/20 1412 01/16/20 1412 01/16/20 2334 01/18/20 0431 01/19/20 0434  NA 137   < > 137 140 138  K 4.2   < > 4.1 3.5 4.2  CL 106   < > 104 104 104  CO2 22   < > 23 26 24   GLUCOSE 141*   < > 133* 103* 124*  BUN 33*   < > 33* 32* 32*  CREATININE 1.45*   < > 1.48* 1.63* 1.46*  CALCIUM 8.9   < > 8.8* 8.6* 9.0  PROT 7.0  --   --   --   --   ALBUMIN 3.5  --   --   --   --   AST 28  --   --   --   --   ALT 12  --   --   --   --   ALKPHOS 79  --   --   --   --     BILITOT 0.5  --   --   --   --   GFRNONAA 46*   < > 45* 40* 46*  GFRAA 53*   < > 52* 46* 53*  ANIONGAP 9   < > 10 10 10    < > = values in this interval not displayed.     Hematology Recent Labs  Lab 01/16/20 2334 01/18/20 0431 01/19/20 0434  WBC 6.0 6.8 7.0  RBC 3.29* 3.17* 3.32*  HGB 10.9* 10.4* 10.8*  HCT 31.4* 30.5* 32.1*  MCV 95.4 96.2 96.7  MCH 33.1 32.8 32.5  MCHC 34.7 34.1 33.6  RDW 14.1 14.0 14.0  PLT 154 148* 158    BNPNo results for input(s): BNP, PROBNP in the last 168 hours.   DDimer No results for input(s): DDIMER in the last 168 hours.   Radiology    ECHOCARDIOGRAM COMPLETE  Result Date: 01/17/2020   ECHOCARDIOGRAM REPORT   Patient Name:   SHAWNTE JON Date of Exam: 01/17/2020 Medical Rec #:  [id number]     Height:       68.0 in Accession #:    [id number]    Weight:       194.7 lb Date of Birth:  22-Nov-1941    BSA:          2.02 m Patient Age:    52 years      BP:           121/67 mmHg Patient Gender: M             HR:           78 bpm. Exam Location:  Inpatient Procedure: 2D Echo, Intracardiac Opacification Agent, Cardiac Doppler and Color            Doppler Indications:    R07.9* Chest pain, unspecified  History:        Patient has prior history of Echocardiogram examinations, most                 recent 02/12/2019. CHF and Cardiomyopathy, CAD and Previous  Myocardial Infarction, Mitral Valve Disease; Risk                 Factors:Dyslipidemia. Parkinson's Disease. GERD.  Sonographer:    Jonelle Sidle Dance Referring Phys: 8 [place]  1. Left ventricular ejection fraction, by visual estimation, is 35 to 40%. The left ventricle has low normal function. Left ventricular septal wall thickness was moderately increased. There is moderately increased left ventricular hypertrophy.  2. The left ventricle demonstrates regional wall motion abnormalities. Apical akinesis with midseptal hypokinesis. No significant change in regional wall motion  abnormalities compared to exam from 02/12/2019. Side by side comparison of images performed.  3. Calcifed, mobile mass in the left ventricle. This has been previously documented on echocardiograms serially, and is felt to represent a ruptured chordae tendinae.  4. Global right ventricle has normal systolic function.The right ventricular size is normal. No increase in right ventricular wall thickness.  5. Left atrial size was normal.  6. Right atrial size was normal.  7. Mild mitral annular calcification.  8. The mitral valve is normal in structure. Mild mitral valve regurgitation. No evidence of mitral stenosis.  9. The tricuspid valve is normal in structure. Tricuspid valve regurgitation is not demonstrated. 10. The aortic valve was not well visualized. Aortic valve regurgitation is mild. Mild to moderate aortic valve sclerosis/calcification without any evidence of aortic stenosis. 11. The pulmonic valve was not well visualized. Pulmonic valve regurgitation is trivial. 12. The inferior vena cava is normal in size with greater than 50% respiratory variability, suggesting right atrial pressure of 3 mmHg. 13. There is borderline dilatation of the aortic root measuring 39 mm. FINDINGS  Left Ventricle: Left ventricular ejection fraction, by visual estimation, is 35 to 40%. The left ventricle has low normal function. Definity contrast agent was given IV to delineate the left ventricular endocardial borders. The left ventricle demonstrates regional wall motion abnormalities. There is moderately increased left ventricular hypertrophy. Asymmetric left ventricular hypertrophy. Left ventricular diastolic parameters are indeterminate. Right Ventricle: The right ventricular size is normal. No increase in right ventricular wall thickness. Global RV systolic function is has normal systolic function. Left Atrium: Left atrial size was normal in size. Right Atrium: Right atrial size was normal in size Pericardium: There is no evidence  of pericardial effusion. Mitral Valve: The mitral valve is normal in structure. Mild mitral annular calcification. Mild mitral valve regurgitation. No evidence of mitral valve stenosis by observation. Calcifed, mobile mass in the left ventricle. This has been previously documented on echocardiograms serially, and is felt to represent a ruptured chordae tendinae. Tricuspid Valve: The tricuspid valve is normal in structure. Tricuspid valve regurgitation is not demonstrated. Aortic Valve: The aortic valve was not well visualized. . There is moderate thickening and moderate calcification of the aortic valve. Aortic valve regurgitation is mild. Aortic regurgitation PHT measures 542 msec. Mild to moderate aortic valve sclerosis/calcification is present, without any evidence of aortic stenosis. There is moderate thickening of the aortic valve. There is moderate calcification of the aortic valve. Pulmonic Valve: The pulmonic valve was not well visualized. Pulmonic valve regurgitation is trivial. Pulmonic regurgitation is trivial. Aorta: The aortic root, ascending aorta and aortic arch are all structurally normal, with no evidence of dilitation or obstruction. There is borderline dilatation of the aortic root measuring 39 mm. Venous: The inferior vena cava is normal in size with greater than 50% respiratory variability, suggesting right atrial pressure of 3 mmHg. IAS/Shunts: The interatrial septum was not well visualized.  LEFT VENTRICLE PLAX 2D LVIDd:         4.03 cm  Diastology LVIDs:         3.12 cm  LV e' lateral:   9.14 cm/s LV PW:         1.11 cm  LV E/e' lateral: 9.1 LV IVS:        1.66 cm  LV e' medial:    4.90 cm/s LVOT diam:     1.70 cm  LV E/e' medial:  17.1 LV SV:         33 ml LV SV Index:   15.73 LVOT Area:     2.27 cm  RIGHT VENTRICLE            IVC RV Basal diam:  2.26 cm    IVC diam: 1.91 cm RV S prime:     8.59 cm/s TAPSE (M-mode): 1.5 cm LEFT ATRIUM             Index       RIGHT ATRIUM           Index LA  diam:        3.60 cm 1.78 cm/m  RA Area:     15.00 cm LA Vol (A2C):   53.6 ml 26.53 ml/m RA Volume:   33.40 ml  16.53 ml/m LA Vol (A4C):   70.3 ml 34.80 ml/m LA Biplane Vol: 62.2 ml 30.79 ml/m  AORTIC VALVE LVOT Vmax:   113.90 cm/s LVOT Vmean:  74.600 cm/s LVOT VTI:    0.220 m AI PHT:      542 msec  AORTA Ao Root diam: 3.90 cm Ao Asc diam:  3.70 cm MITRAL VALVE MV Area (PHT): 2.73 cm              SHUNTS MV PHT:        80.62 msec            Systemic VTI:  0.22 m MV Decel Time: 278 msec              Systemic Diam: 1.70 cm MV E velocity: 83.60 cm/s  103 cm/s MV A velocity: 150.00 cm/s 70.3 cm/s MV E/A ratio:  0.56        1.5  Cherlynn Kaiser MD Electronically signed by Cherlynn Kaiser MD Signature Date/Time: 01/17/2020/8:25:24 PM    Final     Cardiac Studies   ECHO 01/17/2020: IMPRESSIONS   1. Left ventricular ejection fraction, by visual estimation, is 35 to  40%. The left ventricle has low normal function. Left ventricular septal  wall thickness was moderately increased. There is moderately increased  left ventricular hypertrophy.  2. The left ventricle demonstrates regional wall motion abnormalities.  Apical akinesis with midseptal hypokinesis. No significant change in  regional wall motion abnormalities compared to exam from 02/12/2019. Side by  side comparison of images performed.  3. Calcifed, mobile mass in the left ventricle. This has been previously  documented on echocardiograms serially, and is felt to represent a  ruptured chordae tendinae.  4. Global right ventricle has normal systolic function.The right  ventricular size is normal. No increase in right ventricular wall  thickness.  5. Left atrial size was normal.  6. Right atrial size was normal.  7. Mild mitral annular calcification.  8. The mitral valve is normal in structure. Mild mitral valve  regurgitation. No evidence of mitral stenosis.  9. The tricuspid valve is normal in structure. Tricuspid valve    regurgitation is not demonstrated.  10. The aortic valve was not well visualized. Aortic valve regurgitation  is mild. Mild to moderate aortic valve sclerosis/calcification without any  evidence of aortic stenosis.  11. The pulmonic valve was not well visualized. Pulmonic valve  regurgitation is trivial.  12. The inferior vena cava is normal in size with greater than 50%  respiratory variability, suggesting right atrial pressure of 3 mmHg.  13. There is borderline dilatation of the aortic root measuring 39 mm.   ECHO:02/12/2019 1. The left ventricle has moderate-severely reduced systolic function,  with an ejection fraction of 30-35%. The cavity size was normal. There is  asymmetric septal hypertrophy.. Left ventricular diastolic Doppler  parameters are consistent with impaired  relaxation Elevated mean left atrial pressure.  2. The right ventricle has normal systolic function. The cavity was  normal. There is no increase in right ventricular wall thickness.  3. The aortic valve has an indeterminant number of cusps Moderate  thickening of the aortic valve Moderate calcification of the aortic valve.  Aortic valve regurgitation is mild to moderate by color flow Doppler. mild  stenosis of the aortic valve.  Moderate aortic annular calcification noted.  4. There is a mobile density within the MV apparatus that appears to be a  rupture chordae. Mild thickening of the mitral valve leaflet. Mild  calcification of the mitral valve leaflet. There is mild mitral annular  calcification present. No evidence of  mitral valve stenosis.  5. The tricuspid valve is normal in structure.  6. The aortic root is normal in size and structure.  7. There is moderate dilatation of the aortic root measuring 41 mm.  8. Moderate hypokinesis of the left ventricular inferior wall.  9. Severe akinesis of the left ventricular anteroseptal wall.  10. Severe akinesis of the left ventricular apical  segment.   CATH:02/10/2019  RCA Prox RCA lesion is 50% stenosed. Prox RCA to Mid RCA lesion is 80% stenosed. Mid RCA lesion is 90% stenosed. Mid RCA to Dist RCA lesion is 100% stenosed.  ----- Part of the PDA and PL system was filled via right to right and left-to-right collaterals.  Cx-OM: Ost Cx to Prox Cx lesion is 45% stenosed with 90% stenosed side branch in LAV Groove.  ------ 3rd LPL-1 lesion is 80% stenosed. 3rd LPL-2 lesion is 99% stenosed.  Prox LAD to Mid LAD lesion is 90% stenosed with 55% stenosed side branch in Ost 2nd Diag.  Mid-distal LAD to Dist LAD lesion is 95% stenosed. Dist LAD lesion is 90% stenosed.  --  LV end diastolic pressure is moderately elevated. Consistent with acute on chronic diastolic heart failure  SUMMARY  Severe multivessel disease: 100% CTO of mid RCA, 50% proximal circumflex with 90% AV groove followed by 100% CTO of LPL (that recanalizes through left left lateral), 90 to 95% bifurcation LAD-2nd Diag followed by 60% and then tandem 99 to 90% lesions in the mid to distal LAD. -- >Minimal good PCI or CABG options  Moderately elevated LVEDP consistent with acute diastolic heart failure  RECOMMENDATIONS  The patient will be transferred to a telemetry bed. I plan to restart IV heparin to run for 48 hours for MI. (Will restart 8 hours post sheath removal)  Continue aggressive medical management.  I discussed the findings with the patient's power of attorney/brother and sister-in-law: Options going forward would be medical management, CABG, random PCI. After discussing with the brother: ? It is clear that PCI is not likely a good option as it would not  be a good candidate for dual antiplatelet therapy with frequent falls, he is also was anemic.  ? CABG would not be something that they would be interested in offering based on his dementia, renal insufficiency and Parkinson's. Would not likely recover.  ? Best option going forward would be  medical management.   With renal insufficiency, we will hydrate him, but he would likely require diuresis and standing diuretic during this hospitalization.  2D echo was ordered to better assess his EF, however I suspect it to be reduced. t     Patient Profile     79 y.o. male with a hx ofcombined systolic and diastolicCHF(EF [id number]), ICM,NSTEMI 01/2019 w/ med rx for multi-vessel CAD, HTN, HLD, GERD, ruptured MV chordae, Parkinson's, dementia, thoracic aortic aneurysm,whopresented with substernal chest pain that radiated to his arm. today for the evaluation ofchest pain.Code STEMI called but canceled then. His EKG changes and elevated trop is now consistent with STEMI definition, however Trope elevation is not as high as expected with current extensive EKG changes. Not a candidate for intervention due to comorbidities and is getting medical management. (01/19/2020: Now done with 72 h of Heparin. Chest pain improved)  Assessment & Plan    Principal Problem:   Acute ST elevation myocardial infarction (STEMI) of anterior wall (HCC) Active Problems:   Acute on chronic combined systolic and diastolic HF (heart failure) (HCC)   Dementia due to Parkinson's disease with behavioral disturbance (HCC)   Hyperlipidemia   Essential hypertension   CKD stage G3a/A2, GFR 45-59 and albumin creatinine ratio 30-299 mg/g   Cardiomyopathy EF 30-35%   Parkinson's disease ([place])   Hypothyroidism    STEMI: Coronary artery disease involving native coronary arteries with unstable angina Patient with Hx of CAD, NSTEMI, sever multivessel disease, PVD, thorasic aorta aneurysm, dementia, parkinson, Hx of falls, Preseneted with chest pain with radiation to arm. EKG with ST elevation at inferior leads and Trop at 844>1229>1193>1199>677> Code STEMI called but canceled later -> after reviewing previous heart catheterization note indicating the severity of disease and poor PCI targets  His  EKG with ST elevation to lateral and anteroseptal leads and inferior lead involevment.   His EKG changes and elevated trop is consistent with STEMI definition (due to LAD blockage) and ischemia in the territories of all branches as well as inferior ischemia, however troponin elevation levels are not consistent with STEMI.   He is a poor candidate for intervention given old age, comorbidities: dementia, parkinson, Hx of anemia and high risk for fallsand bleeding(and not a good candidate for long term DAP after PCI/stent)He continued to have chest pain for first 48 hours but despite recurrent pain, troponin levels trended down.  We increased antianginal medication yesterday. Patient did not report further chest pain since yesterday and did not required Morphine. Tolerated increased amlodipine. BP and HR stable. If remains pin free and stable, may Dc tomorrow. Wife updated by Dr. Ellyn Hack.    -Continue ASA and plavix unintrupted for a month then stop ASA and keep intrupted plavix for 6 months. -Will be done with 72 h of IV Heparin this AM -Continue Coreg 12.5 mg BID -Continue Amlodipine 10 mg QD -Continue Ranexa 500 mg BID (started 2/4) -DC Nitro paste and resume home Isordil (2/5) -Ambulate patient -PT Ot eval and treat to evaluate for home health needs -Continue cardiac monitoring -Supplemental O2 to keep O2 sat >92% (weaning O2 to 2 li) -Diet HH carb modified - If remains pain free and stable, may  Dc tomorrow.  Acute on Chronic Combined systolic and diastolic [id number] cardiomyopathy  Echo 01/17/2020 perorformed. EF 35-40%. Regional wall motion abnormality that is suggestive of LAD involvement, reported no change compairing to prior study 2020  Interestingly, despite new troponin elevations and EKG changes, EF appears to be relatively stable  I/O not recorded. On 2 li nasal O2, O2 sat stable. Volume status improved  - Continue PO Lasix 40 mg BID --> chest x-ray on admission did  not show significant pulmonary edema.  Lungs are relatively clear. -BMP daily -Replace electrolyte as needed -Continue Coreg - No ACE inhibitor until BP allows -Strict I/O -Weight daily -fluid restriction  Other chronic medical conditions: Hypothyroidism:  -Continue home Levothyroxine  Parkinson: Dementia: -Continue home meds: L-dopa Carbidopa, Donepezil, Razagiline, PRN Zolpidem at night for insomnia  This clearly as another level of complexity to his care as he intermittently has spells of lucidity worried will feel chest pain, but otherwise is confused. Patient is stable and calm today  Urinary incontinency:  -Continue home oxybutynin  For questions or updates, please contact [place] Please consult [email or web address] for contact info under       Signed, Dewayne Hatch, MD  01/19/2020, 9:57 AM    ATTENDING ATTESTATION  I have seen, examined and evaluated the patient this AM along with Dr. Myrtie Hawk.  After reviewing all the available data and chart, we discussed the patients laboratory, study & physical findings as well as symptoms in detail. I agree with her findings, examination as well as impression recommendations as per our discussion.    Attending adjustments noted in italics.   Kewon seems to be doing much better today.  No more chest pain, only complaining about neck and shoulder pain.  We have made significant increases in his antianginal regimen and seems to have adequately treated his angina.  He should be finishing up his 72 hours of IV heparin today, and the plan will be to see how he does over the course of his day tomorrow to ensure he has no further angina off of IV heparin.  Plan will be to do DAPT for 1 month but then stop aspirin, continue Plavix (able to be interrupted) for minimum of 6 months unless there are bleeding issues. We have added Ranexa and increase his amlodipine dose as well.  We will down stop his Nitropaste and put him back on  his home dose of Isordil (likely was on Isordil as opposed to isosorbide because of financial issues).  We need to mobilize him today, getting into the chair and walking determine if he is stable for discharge and hopefully will be ready for discharge potentially by tomorrow.  With initial MI - he has had Acute on Chrooic Combined CHF -- currently, he is not having any heart failure symptoms he is on stable dose of diuretic. He has baseline CKD-3 a, but overall creatinine has been stable if not improved from his previous baseline with only 1 dip to GFR less than 45.  Per previously been GFR in the 30s which would be CKD-3B.  Overall stable function.    Glenetta Hew, M.D., M.S. Interventional Cardiologist

## 2020-01-20 DIAGNOSIS — I2510 Atherosclerotic heart disease of native coronary artery without angina pectoris: Secondary | ICD-10-CM | POA: Diagnosis present

## 2020-01-20 LAB — CBC
HCT: 28.9 % — ABNORMAL LOW (ref 39.0–52.0)
Hemoglobin: 10 g/dL — ABNORMAL LOW (ref 13.0–17.0)
MCH: 33 pg (ref 26.0–34.0)
MCHC: 34.6 g/dL (ref 30.0–36.0)
MCV: 95.4 fL (ref 80.0–100.0)
Platelets: 159 10*3/uL (ref 150–400)
RBC: 3.03 MIL/uL — ABNORMAL LOW (ref 4.22–5.81)
RDW: 13.5 % (ref 11.5–15.5)
WBC: 6.3 10*3/uL (ref 4.0–10.5)
nRBC: 0 % (ref 0.0–0.2)

## 2020-01-20 LAB — BASIC METABOLIC PANEL
Anion gap: 10 (ref 5–15)
BUN: 37 mg/dL — ABNORMAL HIGH (ref 8–23)
CO2: 25 mmol/L (ref 22–32)
Calcium: 8.7 mg/dL — ABNORMAL LOW (ref 8.9–10.3)
Chloride: 99 mmol/L (ref 98–111)
Creatinine, Ser: 1.66 mg/dL — ABNORMAL HIGH (ref 0.61–1.24)
GFR calc Af Amer: 45 mL/min — ABNORMAL LOW (ref >60)
GFR calc non Af Amer: 39 mL/min — ABNORMAL LOW (ref >60)
Glucose, Bld: 119 mg/dL — ABNORMAL HIGH (ref 70–99)
Potassium: 3.9 mmol/L (ref 3.5–5.1)
Sodium: 134 mmol/L — ABNORMAL LOW (ref 135–145)

## 2020-01-20 MED ORDER — MUSCLE RUB 10-15 % EX CREA
TOPICAL_CREAM | CUTANEOUS | Status: DC | PRN
  Administered 2020-01-21 – 2020-01-25 (×2): 1 via TOPICAL
  Filled 2020-01-20: qty 85

## 2020-01-20 MED ORDER — ASPIRIN EC 81 MG PO TBEC
81.0000 mg | DELAYED_RELEASE_TABLET | Freq: Every day | ORAL | Status: DC
  Administered 2020-01-20 – 2020-01-25 (×6): 81 mg via ORAL
  Filled 2020-01-20 (×7): qty 1

## 2020-01-20 MED ORDER — POLYVINYL ALCOHOL 1.4 % OP SOLN
1.0000 [drp] | OPHTHALMIC | Status: DC | PRN
  Administered 2020-01-20: 1 [drp] via OPHTHALMIC
  Filled 2020-01-20 (×2): qty 15

## 2020-01-20 MED ORDER — TROLAMINE SALICYLATE 10 % EX CREA
TOPICAL_CREAM | CUTANEOUS | Status: DC | PRN
  Filled 2020-01-20: qty 85

## 2020-01-20 NOTE — Progress Notes (Signed)
Paged Hilty at [phone]. .BP 98/58 & 125/57.Ok to give Lasix 40mg , Norvasc 10mg ? Left arm & leg sore,weak,tender.PRN morphine, but may lower BP. Upon callback, get manual BP and if systolic > [id number] give both. Informed of arm pain; will see patient on rounds.   Spoke with PT at bedside and informed of left shoulder and arm pain, patient unsure if he had prior fall when asked. Shoulder and arm tender to palpation and with movement.   1238 assisted patient with calling wife on room phone  1250 upon further inquiry patient stated neck and arm hurt and at home and walking around with wife helps pain. Inquired if patient used topical gel/cream for pain; pt. stated yes and that it works. No complaints of chest pain.   Spoke with patient wife at 49, per patient request to confirm name of cream/gel used for arm/neck pain at home.  Paged Hilty MD at [phone].Uses icy/hot at home for arm/neck pain. Voltaren gel? Please order eye drops for minor eye redness/burning/irritation. Upon callback informed Hilty of neck and arm pain, guarding and minimal use. Informed wife confirmed uses icy/hot at home and that pain has been a consistent complaint during shift.

## 2020-01-20 NOTE — Evaluation (Signed)
Physical Therapy Evaluation Patient Details Name: [name] MRN: [id number] DOB: January 03, 1941 Today's Date: 01/20/2020   History of Present Illness  Pt is a 79 yr old male who presented to the ED due to chest pain.  PMH but not limited to CHF, ICM w/ EF 30-35% 02/2019 echo, NSTEMI 01/2019 w/ med rx for multi-vessel CAD, HTN, HLD, GERD, ruptured MV chordae, Parkinson's, dementia, thoracic aortic aneurysm.     Clinical Impression  Pt admitted with above diagnosis. On eval, pt required max assist bed mobility and max assist sit to stand with RW. Pt currently with functional limitations due to the deficits listed below (see PT Problem List). Pt will benefit from skilled PT to increase their independence and safety with mobility to allow discharge to the venue listed below.       Follow Up Recommendations SNF;Supervision/Assistance - 24 hour    Equipment Recommendations  Other (comment)(defer to next venue)    Recommendations for Other Services       Precautions / Restrictions Precautions Precautions: Fall      Mobility  Bed Mobility Overal bed mobility: Needs Assistance Bed Mobility: Supine to Sit;Rolling;Sit to Supine Rolling: Max assist   Supine to sit: Max assist;HOB elevated Sit to supine: Mod assist;HOB elevated   General bed mobility comments: cues for sequencing, increased time, +rail  Transfers Overall transfer level: Needs assistance Equipment used: Rolling walker (2 wheeled) Transfers: Sit to/from Stand Sit to Stand: Max assist;From elevated surface         General transfer comment: sit to stand x 2 trials with RW. Flexed posture and retropulsive in stance requiring return to sitting EOB. Static stand < 10 seconds/trial.  Ambulation/Gait             General Gait Details: unable  Stairs            Wheelchair Mobility    Modified Rankin (Stroke Patients Only)       Balance Overall balance assessment: Needs assistance Sitting-balance  support: No upper extremity supported;Feet supported Sitting balance-Leahy Scale: Good     Standing balance support: Bilateral upper extremity supported;During functional activity Standing balance-Leahy Scale: Poor Standing balance comment: heavy reliance on external support                             Pertinent Vitals/Pain Pain Assessment: Faces Faces Pain Scale: Hurts little more Pain Location: L shoulder Pain Descriptors / Indicators: Guarding;Grimacing Pain Intervention(s): Monitored during session;Limited activity within patient's tolerance;Repositioned    Home Living Family/patient expects to be discharged to:: Private residence Living Arrangements: Spouse/significant other;Children Available Help at Discharge: Family;Available 24 hours/day Type of Home: House Home Access: Level entry     Home Layout: One level Home Equipment: Walker - 4 wheels Additional Comments: all information per patient and at this time poor historian    Prior Function Level of Independence: Independent with assistive device(s)         Comments: houshold ambulator with Rollator     Hand Dominance   Dominant Hand: Right    Extremity/Trunk Assessment   Upper Extremity Assessment LUE Deficits / Details: Pt guarding L UE/shoulder. Able to grip RW but otherwise no active/intentional use of LUE.    Lower Extremity Assessment Lower Extremity Assessment: Generalized weakness;LLE deficits/detail;RLE deficits/detail RLE Deficits / Details: abnormal tone, Parkinson's at baseline LLE Deficits / Details: abnormal tone, Parkinson's at baseline. Pt stating "this is my bad foot" in regards to L, but  unable to clarify why.       Communication   Communication: HOH  Cognition Arousal/Alertness: Awake/alert Behavior During Therapy: WFL for tasks assessed/performed Overall Cognitive Status: No family/caregiver present to determine baseline cognitive functioning                                  General Comments: Dementia at baseline. Following simple commands. Disoriented to time and situation. Easily distracted. Decreased safety awareness.      General Comments General comments (skin integrity, edema, etc.): VSS    Exercises     Assessment/Plan    PT Assessment Patient needs continued PT services  PT Problem List Decreased strength;Decreased mobility;Decreased safety awareness;Decreased knowledge of precautions;Decreased activity tolerance;Decreased cognition;Decreased balance;Decreased knowledge of use of DME       PT Treatment Interventions Therapeutic activities;Gait training;Therapeutic exercise;Patient/family education;Balance training;Functional mobility training    PT Goals (Current goals can be found in the Care Plan section)  Acute Rehab PT Goals Patient Stated Goal: not stated PT Goal Formulation: Patient unable to participate in goal setting Time For Goal Achievement: 02/03/20 Potential to Achieve Goals: Fair    Frequency Min 2X/week   Barriers to discharge        Co-evaluation               AM-PAC PT "6 Clicks" Mobility  Outcome Measure Help needed turning from your back to your side while in a flat bed without using bedrails?: A Lot Help needed moving from lying on your back to sitting on the side of a flat bed without using bedrails?: A Lot Help needed moving to and from a bed to a chair (including a wheelchair)?: Total Help needed standing up from a chair using your arms (e.g., wheelchair or bedside chair)?: A Lot Help needed to walk in hospital room?: Total Help needed climbing 3-5 steps with a railing? : Total 6 Click Score: 9    End of Session Equipment Utilized During Treatment: Gait belt Activity Tolerance: Patient tolerated treatment well Patient left: in bed;with call bell/phone within reach;with bed alarm set Nurse Communication: Mobility status PT Visit Diagnosis: Other abnormalities of gait and mobility  (R26.89);Pain Pain - Right/Left: Left Pain - part of body: Shoulder    Time: [id number] PT Time Calculation (min) (ACUTE ONLY): 20 min   Charges:   PT Evaluation $PT Eval Moderate Complexity: 1 Mod          Lorrin Goodell, PT  Office # [phone] Pager [phone]   Lorriane Shire 01/20/2020, 2:02 PM

## 2020-01-20 NOTE — Evaluation (Signed)
Occupational Therapy Evaluation Patient Details Name: [name] MRN: [id number] DOB: August 08, 1941 Today's Date: 01/20/2020    History of Present Illness Pt is a 79 yr old male who presented to the ED due to chest pain.  PMH but not limited to CHF, ICM w/ EF 30-35% 02/2019 echo, NSTEMI 01/2019 w/ med rx for multi-vessel CAD, HTN, HLD, GERD, ruptured MV chordae, Parkinson's, dementia, thoracic aortic aneurysm.    Clinical Impression   Pt admitted with chest pain. Pt is currently having 6-7/10 pain in L shoulder and guarding. Pt is unable to report if they had any recent falls. Pt asked if it is day or night with outside light coming into there room. Pt has decrease coordination to complete with RUE self feeding and self care as would not use LUE. Nurse staff aware about LUE pain at this time. Pt currently with functional limitations due to the deficits listed below (see OT Problem List). Pt will benefit from skilled OT to increase their safety and independence with ADL and functional mobility for ADL to facilitate discharge to venue listed below.       Follow Up Recommendations  SNF;Supervision/Assistance - 24 hour    Equipment Recommendations       Recommendations for Other Services       Precautions / Restrictions Precautions Precautions: Fall Restrictions Weight Bearing Restrictions: Yes      Mobility Bed Mobility Overal bed mobility: Needs Assistance Bed Mobility: Rolling Rolling: Max assist;+2 for physical assistance;+2 for safety/equipment            Transfers Overall transfer level: Needs assistance               General transfer comment: unable to assess fully at this time due to pain in LUE and ability to follow commands     Balance Overall balance assessment: Needs assistance                                         ADL either performed or assessed with clinical judgement   ADL Overall ADL's : Needs  assistance/impaired Eating/Feeding: Minimal assistance;Cueing for safety;Cueing for sequencing;Bed level;Sitting   Grooming: Oral care;Minimal assistance;Sitting;Standing;Bed level   Upper Body Bathing: Maximal assistance;Cueing for safety;Cueing for sequencing;Bed level   Lower Body Bathing: Maximal assistance;+2 for physical assistance;+2 for safety/equipment;Bed level   Upper Body Dressing : Maximal assistance;Bed level   Lower Body Dressing: Maximal assistance;+2 for physical assistance;+2 for safety/equipment;Bed level                 General ADL Comments: unable to complete OOB activity due to saftey with following commands      Vision Patient Visual Report: No change from baseline Vision Assessment?: No apparent visual deficits     Perception Perception Perception Tested?: No   Praxis Praxis Praxis tested?: Not tested    Pertinent Vitals/Pain Pain Assessment: 0-10 Pain Score: 6  Pain Location: L shoulder Pain Descriptors / Indicators: Aching;Discomfort;Guarding;Grimacing Pain Intervention(s): Monitored during session     Hand Dominance Right   Extremity/Trunk Assessment Upper Extremity Assessment Upper Extremity Assessment: LUE deficits/detail LUE Deficits / Details: pt guarding L shoulder and only will complete digital and wrist movments  LUE: Unable to fully assess due to pain       Cervical / Trunk Assessment Cervical / Trunk Assessment: Kyphotic   Communication Communication Communication: HOH   Cognition Arousal/Alertness: Awake/alert Behavior  During Therapy: WFL for tasks assessed/performed Overall Cognitive Status: No family/caregiver present to determine baseline cognitive functioning                                 General Comments: pt difficulty to understand and decrease oreintation   General Comments       Exercises     Shoulder Instructions      Home Living Family/patient expects to be discharged to:: Private  residence Living Arrangements: Spouse/significant other;Children Available Help at Discharge: Family;Available 24 hours/day Type of Home: House Home Access: Level entry     Home Layout: One level               Home Equipment: Walker - 4 wheels   Additional Comments: all information per patient and at this time poor historian      Prior Functioning/Environment Level of Independence: Independent with assistive device(s)        Comments: houshold ambulator with Rollator        OT Problem List: Impaired balance (sitting and/or standing);Decreased activity tolerance;Decreased safety awareness;Decreased range of motion;Decreased strength;Decreased knowledge of use of DME or AE;Pain      OT Treatment/Interventions:      OT Goals(Current goals can be found in the care plan section) Acute Rehab OT Goals Patient Stated Goal: to have less pain in arm OT Goal Formulation: With patient Time For Goal Achievement: 02/10/20 Potential to Achieve Goals: Good ADL Goals Pt Will Perform Eating: with modified independence;sitting Pt Will Perform Grooming: with supervision;sitting Pt Will Perform Upper Body Dressing: with mod assist;sitting  OT Frequency: Min 2X/week   Barriers to D/C: Decreased caregiver support          Co-evaluation              AM-PAC OT "6 Clicks" Daily Activity     Outcome Measure Help from another person eating meals?: A Little Help from another person taking care of personal grooming?: A Lot Help from another person toileting, which includes using toliet, bedpan, or urinal?: Total Help from another person bathing (including washing, rinsing, drying)?: A Lot Help from another person to put on and taking off regular upper body clothing?: A Lot Help from another person to put on and taking off regular lower body clothing?: A Lot 6 Click Score: 12   End of Session    Activity Tolerance: Patient limited by pain Patient left: in bed;with call  bell/phone within reach;with bed alarm set  OT Visit Diagnosis: Unsteadiness on feet (R26.81);Muscle weakness (generalized) (M62.81);Repeated falls (R29.6);Pain Pain - Right/Left: Left Pain - part of body: Shoulder                Time: [id number] OT Time Calculation (min): 25 min Charges:  OT General Charges $OT Visit: 1 Visit OT Evaluation $OT Eval Low Complexity: 1 Low OT Treatments $Self Care/Home Management : 8-22 mins  Joeseph Amor OTR/L  Acute Rehab Services  [phone] office number [phone] pager number   Joeseph Amor 01/20/2020, 10:51 AM

## 2020-01-20 NOTE — Progress Notes (Addendum)
Progress Note  Patient Name: [name] Date of Encounter: 01/20/2020  Primary Cardiologist: Carlyle Dolly, MD    Patient Profile:   [name] a 79 y.o.malewith a hx ofcombined systolic and diastolicCHF(EF [id number]), ICM,NSTEMI 01/2019 w/ med rx for multi-vessel CAD, HTN, HLD, GERD, ruptured MV chordae, Parkinson's, dementia, thoracic aortic aneurysm,whopresented with substernal chest pain that radiated to his arm. ST elevation on inf and ant leads. Code STEMI called but canceled then. His EKG changes and elevated trop is now consistent with STEMI definition, however Trope elevation is not as high as expected with current extensive EKG changes. He has not been a candidate for intervention due to comorbidities and is getting medical management. Had some continuous chest pain in first 48 hours but Troponin trended down. 01/19/2020: Now done with 72 h of Heparin. Chest pain improved.  Subjective   Cr stable (1.6->1.5->1.7).  BP 105/50 this AM.  On room air.  Denies any chest pain or dyspnea this morning  Inpatient Medications    Scheduled Meds: . acidophilus  1 capsule Oral BID  . amLODipine  10 mg Oral Daily  . atorvastatin  40 mg Oral q1800  . carbidopa-levodopa  1 tablet Oral BID  . carvedilol  12.5 mg Oral BID WC  . clopidogrel  75 mg Oral Daily  . donepezil  10 mg Oral QHS  . DULoxetine  60 mg Oral QHS  . enoxaparin (LOVENOX) injection  40 mg Subcutaneous Q24H  . ferrous sulfate  325 mg Oral Q breakfast  . folic acid  1 mg Oral QHS  . furosemide  40 mg Oral BID  . hydrOXYzine  25 mg Oral TID  . isosorbide dinitrate  20 mg Oral TID  . levothyroxine  100 mcg Oral QAC breakfast  . multivitamin with minerals  1 tablet Oral q morning - 10a  . oxybutynin  5 mg Oral BID  . ranolazine  500 mg Oral BID  . rasagiline  1 mg Oral Daily  . simethicone  80 mg Oral BID  . sodium chloride flush  3 mL Intravenous Q12H   Continuous Infusions: . sodium  chloride     PRN Meds: sodium chloride, acetaminophen, ALPRAZolam, morphine injection, nitroGLYCERIN, ondansetron (ZOFRAN) IV, sodium chloride flush, triamcinolone ointment, zolpidem   Vital Signs    Vitals:   01/20/20 0338 01/20/20 0736 01/20/20 0800 01/20/20 0937  BP:  (!) 98/58 (!) 125/57 (!) 105/50  Pulse:   82   Resp:   11   Temp:  98.4 F (36.9 C)    TempSrc:  Oral    SpO2:   92%   Weight: 87.3 kg     Height:        Intake/Output Summary (Last 24 hours) at 01/20/2020 1048 Last data filed at 01/20/2020 0600 Gross per 24 hour  Intake 1583 ml  Output 1250 ml  Net 333 ml   Last 3 Weights 01/20/2020 01/19/2020 01/18/2020  Weight (lbs) 192 lb 7.4 oz 193 lb 5.5 oz 194 lb 10.7 oz  Weight (kg) 87.3 kg 87.7 kg 88.3 kg      Telemetry    SR with rate 60-70s  - Personally Reviewed  ECG    No new ECG - Personally Reviewed  Physical Exam   GEN: No acute distress.   Neck:  no JVD  Cardiac: RRR, 2/6 systolic murmur  Respiratory: On room air, Clear to auscultation bilaterally. GI: Soft, nontender, non-distended  MS: No edema Neuro:  Nonfocal  Psych: Normal  affect   Labs    High Sensitivity Troponin:   Recent Labs  Lab 01/16/20 1412 01/16/20 1612 01/16/20 1843 01/16/20 2139 01/18/20 1045  TROPONINIHS 844* 1,229* 1,193* 1,199* 677*      Chemistry Recent Labs  Lab 01/16/20 1412 01/16/20 2334 01/18/20 0431 01/19/20 0434 01/20/20 0414  NA 137   < > 140 138 134*  K 4.2   < > 3.5 4.2 3.9  CL 106   < > 104 104 99  CO2 22   < > 26 24 25   GLUCOSE 141*   < > 103* 124* 119*  BUN 33*   < > 32* 32* 37*  CREATININE 1.45*   < > 1.63* 1.46* 1.66*  CALCIUM 8.9   < > 8.6* 9.0 8.7*  PROT 7.0  --   --   --   --   ALBUMIN 3.5  --   --   --   --   AST 28  --   --   --   --   ALT 12  --   --   --   --   ALKPHOS 79  --   --   --   --   BILITOT 0.5  --   --   --   --   GFRNONAA 46*   < > 40* 46* 39*  GFRAA 53*   < > 46* 53* 45*  ANIONGAP 9   < > 10 10 10    < > = values in  this interval not displayed.     Hematology Recent Labs  Lab 01/18/20 0431 01/19/20 0434 01/20/20 0414  WBC 6.8 7.0 6.3  RBC 3.17* 3.32* 3.03*  HGB 10.4* 10.8* 10.0*  HCT 30.5* 32.1* 28.9*  MCV 96.2 96.7 95.4  MCH 32.8 32.5 33.0  MCHC 34.1 33.6 34.6  RDW 14.0 14.0 13.5  PLT 148* 158 159    BNPNo results for input(s): BNP, PROBNP in the last 168 hours.   DDimer No results for input(s): DDIMER in the last 168 hours.   Radiology    No results found.  Cardiac Studies   ECHO 01/17/2020: IMPRESSIONS   1. Left ventricular ejection fraction, by visual estimation, is 35 to  40%. The left ventricle has low normal function. Left ventricular septal  wall thickness was moderately increased. There is moderately increased  left ventricular hypertrophy.  2. The left ventricle demonstrates regional wall motion abnormalities.  Apical akinesis with midseptal hypokinesis. No significant change in  regional wall motion abnormalities compared to exam from 02/12/2019. Side by  side comparison of images performed.  3. Calcifed, mobile mass in the left ventricle. This has been previously  documented on echocardiograms serially, and is felt to represent a  ruptured chordae tendinae.  4. Global right ventricle has normal systolic function.The right  ventricular size is normal. No increase in right ventricular wall  thickness.  5. Left atrial size was normal.  6. Right atrial size was normal.  7. Mild mitral annular calcification.  8. The mitral valve is normal in structure. Mild mitral valve  regurgitation. No evidence of mitral stenosis.  9. The tricuspid valve is normal in structure. Tricuspid valve  regurgitation is not demonstrated.  10. The aortic valve was not well visualized. Aortic valve regurgitation  is mild. Mild to moderate aortic valve sclerosis/calcification without any  evidence of aortic stenosis.  11. The pulmonic valve was not well visualized. Pulmonic valve    regurgitation is trivial.  12. The  inferior vena cava is normal in size with greater than 50%  respiratory variability, suggesting right atrial pressure of 3 mmHg.  13. There is borderline dilatation of the aortic root measuring 39 mm.   ECHO:02/12/2019 1. The left ventricle has moderate-severely reduced systolic function,  with an ejection fraction of 30-35%. The cavity size was normal. There is  asymmetric septal hypertrophy.. Left ventricular diastolic Doppler  parameters are consistent with impaired  relaxation Elevated mean left atrial pressure.  2. The right ventricle has normal systolic function. The cavity was  normal. There is no increase in right ventricular wall thickness.  3. The aortic valve has an indeterminant number of cusps Moderate  thickening of the aortic valve Moderate calcification of the aortic valve.  Aortic valve regurgitation is mild to moderate by color flow Doppler. mild  stenosis of the aortic valve.  Moderate aortic annular calcification noted.  4. There is a mobile density within the MV apparatus that appears to be a  rupture chordae. Mild thickening of the mitral valve leaflet. Mild  calcification of the mitral valve leaflet. There is mild mitral annular  calcification present. No evidence of  mitral valve stenosis.  5. The tricuspid valve is normal in structure.  6. The aortic root is normal in size and structure.  7. There is moderate dilatation of the aortic root measuring 41 mm.  8. Moderate hypokinesis of the left ventricular inferior wall.  9. Severe akinesis of the left ventricular anteroseptal wall.  10. Severe akinesis of the left ventricular apical segment.   CATH:02/10/2019  RCA Prox RCA lesion is 50% stenosed. Prox RCA to Mid RCA lesion is 80% stenosed. Mid RCA lesion is 90% stenosed. Mid RCA to Dist RCA lesion is 100% stenosed.  ----- Part of the PDA and PL system was filled via right to right and left-to-right  collaterals.  Cx-OM: Ost Cx to Prox Cx lesion is 45% stenosed with 90% stenosed side branch in LAV Groove.  ------ 3rd LPL-1 lesion is 80% stenosed. 3rd LPL-2 lesion is 99% stenosed.  Prox LAD to Mid LAD lesion is 90% stenosed with 55% stenosed side branch in Ost 2nd Diag.  Mid-distal LAD to Dist LAD lesion is 95% stenosed. Dist LAD lesion is 90% stenosed.  --  LV end diastolic pressure is moderately elevated. Consistent with acute on chronic diastolic heart failure  SUMMARY  Severe multivessel disease: 100% CTO of mid RCA, 50% proximal circumflex with 90% AV groove followed by 100% CTO of LPL (that recanalizes through left left lateral), 90 to 95% bifurcation LAD-2nd Diag followed by 60% and then tandem 99 to 90% lesions in the mid to distal LAD. -- >Minimal good PCI or CABG options  Moderately elevated LVEDP consistent with acute diastolic heart failure  RECOMMENDATIONS  The patient will be transferred to a telemetry bed. I plan to restart IV heparin to run for 48 hours for MI. (Will restart 8 hours post sheath removal)  Continue aggressive medical management.  I discussed the findings with the patient's power of attorney/brother and sister-in-law: Options going forward would be medical management, CABG, random PCI. After discussing with the brother: ? It is clear that PCI is not likely a good option as it would not be a good candidate for dual antiplatelet therapy with frequent falls, he is also was anemic.  ? CABG would not be something that they would be interested in offering based on his dementia, renal insufficiency and Parkinson's. Would not likely recover.  ? Best  option going forward would be medical management.   With renal insufficiency, we will hydrate him, but he would likely require diuresis and standing diuretic during this hospitalization.  2D echo was ordered to better assess his EF, however I suspect it to be reduced. t     Patient Profile      79 y.o. male with a hx ofcombined systolic and diastolicCHF(EF [id number]), ICM,NSTEMI 01/2019 w/ med rx for multi-vessel CAD, HTN, HLD, GERD, ruptured MV chordae, Parkinson's, dementia, thoracic aortic aneurysm,whopresented with substernal chest pain that radiated to his arm. today for the evaluation ofchest pain.Code STEMI called but canceled then. His EKG changes and elevated trop is now consistent with STEMI definition, however Trope elevation is not as high as expected with current extensive EKG changes. Not a candidate for intervention due to comorbidities and is getting medical management. (01/19/2020: Now done with 72 h of Heparin. Chest pain improved)  Assessment & Plan    Principal Problem:   Acute ST elevation myocardial infarction (STEMI) of anterior wall (HCC) Active Problems:   Acute on chronic combined systolic and diastolic HF (heart failure) (HCC)   Dementia due to Parkinson's disease with behavioral disturbance (HCC)   Hyperlipidemia   Essential hypertension   CKD stage G3a/A2, GFR 45-59 and albumin creatinine ratio 30-299 mg/g   Cardiomyopathy EF 30-35%   Parkinson's disease ([place])   Hypothyroidism    STEMI: : Patient with Hx of CAD, NSTEMI, sever multivessel disease, PVD, thorasic aorta aneurysm, dementia, parkinson, Hx of falls, Preseneted with chest pain with radiation to arm. EKG with ST elevation at inferior leads and Trop at 844>1229>1193>1199>677> Code STEMI called but canceled later -> after reviewing previous heart catheterization note indicating the severity of disease and poor PCI targets.  His EKG with ST elevation to lateral and anteroseptal leads and inferior lead involevment.   His EKG changes and elevated trop is consistent with STEMI definition (due to LAD blockage) and ischemia in the territories of all branches as well as inferior ischemia, however troponin elevation levels are not consistent with STEMI. (peak 1229).  He is a poor  candidate for intervention given age, comorbidities: dementia, parkinson, Hx of anemia and high risk for fallsand bleeding(and not a good candidate for long term DAP after PCI/stent).  He continued to have chest pain for first 48 hours but despite recurrent pain, troponin levels trended down.   -Continue ASA and plavix unintrupted for a month then stop ASA and keep intrupted plavix for 6 months. -Completed 72 hours of heparin -Continue Coreg 12.5 mg BID -Continue Amlodipine 10 mg QD -Continue Ranexa 500 mg BID (started 2/4) -Off Nitro paste and resumed home Isordil (2/5)   Acute on Chronic Combined systolic and diastolic [id number] cardiomyopathy.  Echo 01/17/2020 EF 35-40%. Regional wall motion abnormality that is suggestive of LAD involvement, reported no change compairing to prior study 2020 -Interestingly, despite new troponin elevations and EKG changes, EF appears to be relatively stable.  I/O not recorded.  On 2L nasal O2, O2 sat stable. - Continue PO Lasix 40 mg BID --> chest x-ray on admission did not show significant pulmonary edema.  Lungs are relatively clear. -BMP daily -Replace electrolyte as needed -Continue Coreg -No ACE inhibitor until BP allows -Strict I/O -Weight daily -fluid restriction  Other chronic medical conditions: Hypothyroidism:  -Continue home Levothyroxine  Parkinson: Dementia: -Continue home meds: L-dopa Carbidopa, Donepezil, Razagiline, PRN Zolpidem at night for insomnia  This clearly as another level of complexity to his care  as he intermittently has spells of lucidity worried will feel chest pain, but otherwise is confused. Patient is stable and calm today  Urinary incontinency:  -Continue home oxybutynin  Disposition:  OT recommended SNF.  Will consult social work for placement  For questions or updates, please contact [place] Please consult [email or web address] for contact info under       Signed, Donato Heinz, MD  01/20/2020,  10:48 AM

## 2020-01-21 ENCOUNTER — Inpatient Hospital Stay (HOSPITAL_COMMUNITY): Payer: PPO

## 2020-01-21 LAB — BASIC METABOLIC PANEL
Anion gap: 12 (ref 5–15)
BUN: 44 mg/dL — ABNORMAL HIGH (ref 8–23)
CO2: 24 mmol/L (ref 22–32)
Calcium: 9 mg/dL (ref 8.9–10.3)
Chloride: 99 mmol/L (ref 98–111)
Creatinine, Ser: 1.71 mg/dL — ABNORMAL HIGH (ref 0.61–1.24)
GFR calc Af Amer: 44 mL/min — ABNORMAL LOW (ref >60)
GFR calc non Af Amer: 38 mL/min — ABNORMAL LOW (ref >60)
Glucose, Bld: 101 mg/dL — ABNORMAL HIGH (ref 70–99)
Potassium: 4.1 mmol/L (ref 3.5–5.1)
Sodium: 135 mmol/L (ref 135–145)

## 2020-01-21 LAB — TROPONIN I (HIGH SENSITIVITY): Troponin I (High Sensitivity): 336 ng/L (ref <18)

## 2020-01-21 MED ORDER — TRAMADOL HCL 50 MG PO TABS
50.0000 mg | ORAL_TABLET | Freq: Four times a day (QID) | ORAL | Status: DC | PRN
  Administered 2020-01-23 – 2020-01-25 (×2): 50 mg via ORAL
  Filled 2020-01-21 (×2): qty 1

## 2020-01-21 MED ORDER — ISOSORBIDE DINITRATE 10 MG PO TABS
30.0000 mg | ORAL_TABLET | Freq: Three times a day (TID) | ORAL | Status: DC
  Administered 2020-01-21 – 2020-01-25 (×13): 30 mg via ORAL
  Filled 2020-01-21 (×13): qty 3

## 2020-01-21 NOTE — Progress Notes (Signed)
Progress Note  Patient Name: [name] Date of Encounter: 01/21/2020  Primary Cardiologist: Carlyle Dolly, MD    Patient Profile:   [name] a 79 y.o.malewith a hx ofcombined systolic and diastolicCHF(EF [id number]), ICM,NSTEMI 01/2019 w/ med rx for multi-vessel CAD, HTN, HLD, GERD, ruptured MV chordae, Parkinson's, dementia, thoracic aortic aneurysm,whopresented with substernal chest pain that radiated to his arm. ST elevation on inf and ant leads. Code STEMI called but canceled then. His EKG changes and elevated trop is now consistent with STEMI definition, however Trope elevation is not as high as expected with current extensive EKG changes. He has not been a candidate for intervention due to comorbidities and is getting medical management. Had some continuous chest pain in first 48 hours but Troponin trended down. 01/19/2020: Now done with 72 h of Heparin. Chest pain improved.  Subjective   Cr stable (1.6->1.5->1.7->1.7).  BP 127/71 this AM.  On 2L [place].  Chest pain this morning, given SL NTG x1 then IV morphinex1.  Pain improved, was sleeping when I saw him this morning.  When I awoke him, said still with some pain, but primarily pain is in his left arm.  Reports that he fell out of bed at home prior to admission and has been having pain in L shoulder, arm, and foot.  Inpatient Medications    Scheduled Meds: . acidophilus  1 capsule Oral BID  . amLODipine  10 mg Oral Daily  . aspirin EC  81 mg Oral Daily  . atorvastatin  40 mg Oral q1800  . carbidopa-levodopa  1 tablet Oral BID  . carvedilol  12.5 mg Oral BID WC  . clopidogrel  75 mg Oral Daily  . donepezil  10 mg Oral QHS  . DULoxetine  60 mg Oral QHS  . enoxaparin (LOVENOX) injection  40 mg Subcutaneous Q24H  . ferrous sulfate  325 mg Oral Q breakfast  . folic acid  1 mg Oral QHS  . furosemide  40 mg Oral BID  . hydrOXYzine  25 mg Oral TID  . isosorbide dinitrate  20 mg Oral TID  .  levothyroxine  100 mcg Oral QAC breakfast  . multivitamin with minerals  1 tablet Oral q morning - 10a  . oxybutynin  5 mg Oral BID  . ranolazine  500 mg Oral BID  . rasagiline  1 mg Oral Daily  . simethicone  80 mg Oral BID  . sodium chloride flush  3 mL Intravenous Q12H   Continuous Infusions: . sodium chloride     PRN Meds: sodium chloride, acetaminophen, ALPRAZolam, morphine injection, Muscle Rub, nitroGLYCERIN, ondansetron (ZOFRAN) IV, polyvinyl alcohol, sodium chloride flush, triamcinolone ointment, zolpidem   Vital Signs    Vitals:   01/21/20 0700 01/21/20 0848 01/21/20 0858 01/21/20 0915  BP:  122/84 100/60 127/71  Pulse: 67 75 79 80  Resp: (!) 9 (!) 26 19 16   Temp:      TempSrc:      SpO2: 92% 91% 91% 97%  Weight:      Height:        Intake/Output Summary (Last 24 hours) at 01/21/2020 0939 Last data filed at 01/21/2020 0500 Gross per 24 hour  Intake 1030 ml  Output 1750 ml  Net -720 ml   Last 3 Weights 01/21/2020 01/20/2020 01/19/2020  Weight (lbs) 193 lb 2 oz 192 lb 7.4 oz 193 lb 5.5 oz  Weight (kg) 87.6 kg 87.3 kg 87.7 kg      Telemetry  SR with rate 60-70s  - Personally Reviewed  ECG    Sinus rhythm, rate 76, inferior Q waves, anterior Q waves, ST elevation V1-3 1 mm- Personally Reviewed  Physical Exam   GEN: No acute distress.   Neck:  no JVD  Cardiac: RRR, 2/6 systolic murmur  Respiratory: On 2L [place], Clear to auscultation bilaterally. GI: Soft, nontender, non-distended  MS: No edema.  L arm tender to palpation and has pain with any movement Neuro:  Nonfocal  Psych: Normal affect   Labs    High Sensitivity Troponin:   Recent Labs  Lab 01/16/20 1412 01/16/20 1612 01/16/20 1843 01/16/20 2139 01/18/20 1045  TROPONINIHS 844* 1,229* 1,193* 1,199* 677*      Chemistry Recent Labs  Lab 01/16/20 1412 01/16/20 2334 01/19/20 0434 01/20/20 0414 01/21/20 0512  NA 137   < > 138 134* 135  K 4.2   < > 4.2 3.9 4.1  CL 106   < > 104 99 99  CO2 22    < > 24 25 24   GLUCOSE 141*   < > 124* 119* 101*  BUN 33*   < > 32* 37* 44*  CREATININE 1.45*   < > 1.46* 1.66* 1.71*  CALCIUM 8.9   < > 9.0 8.7* 9.0  PROT 7.0  --   --   --   --   ALBUMIN 3.5  --   --   --   --   AST 28  --   --   --   --   ALT 12  --   --   --   --   ALKPHOS 79  --   --   --   --   BILITOT 0.5  --   --   --   --   GFRNONAA 46*   < > 46* 39* 38*  GFRAA 53*   < > 53* 45* 44*  ANIONGAP 9   < > 10 10 12    < > = values in this interval not displayed.     Hematology Recent Labs  Lab 01/18/20 0431 01/19/20 0434 01/20/20 0414  WBC 6.8 7.0 6.3  RBC 3.17* 3.32* 3.03*  HGB 10.4* 10.8* 10.0*  HCT 30.5* 32.1* 28.9*  MCV 96.2 96.7 95.4  MCH 32.8 32.5 33.0  MCHC 34.1 33.6 34.6  RDW 14.0 14.0 13.5  PLT 148* 158 159    BNPNo results for input(s): BNP, PROBNP in the last 168 hours.   DDimer No results for input(s): DDIMER in the last 168 hours.   Radiology    No results found.  Cardiac Studies   ECHO 01/17/2020: IMPRESSIONS   1. Left ventricular ejection fraction, by visual estimation, is 35 to  40%. The left ventricle has low normal function. Left ventricular septal  wall thickness was moderately increased. There is moderately increased  left ventricular hypertrophy.  2. The left ventricle demonstrates regional wall motion abnormalities.  Apical akinesis with midseptal hypokinesis. No significant change in  regional wall motion abnormalities compared to exam from 02/12/2019. Side by  side comparison of images performed.  3. Calcifed, mobile mass in the left ventricle. This has been previously  documented on echocardiograms serially, and is felt to represent a  ruptured chordae tendinae.  4. Global right ventricle has normal systolic function.The right  ventricular size is normal. No increase in right ventricular wall  thickness.  5. Left atrial size was normal.  6. Right atrial size was normal.  7. Mild  mitral annular calcification.  8. The mitral  valve is normal in structure. Mild mitral valve  regurgitation. No evidence of mitral stenosis.  9. The tricuspid valve is normal in structure. Tricuspid valve  regurgitation is not demonstrated.  10. The aortic valve was not well visualized. Aortic valve regurgitation  is mild. Mild to moderate aortic valve sclerosis/calcification without any  evidence of aortic stenosis.  11. The pulmonic valve was not well visualized. Pulmonic valve  regurgitation is trivial.  12. The inferior vena cava is normal in size with greater than 50%  respiratory variability, suggesting right atrial pressure of 3 mmHg.  13. There is borderline dilatation of the aortic root measuring 39 mm.   ECHO:02/12/2019 1. The left ventricle has moderate-severely reduced systolic function,  with an ejection fraction of 30-35%. The cavity size was normal. There is  asymmetric septal hypertrophy.. Left ventricular diastolic Doppler  parameters are consistent with impaired  relaxation Elevated mean left atrial pressure.  2. The right ventricle has normal systolic function. The cavity was  normal. There is no increase in right ventricular wall thickness.  3. The aortic valve has an indeterminant number of cusps Moderate  thickening of the aortic valve Moderate calcification of the aortic valve.  Aortic valve regurgitation is mild to moderate by color flow Doppler. mild  stenosis of the aortic valve.  Moderate aortic annular calcification noted.  4. There is a mobile density within the MV apparatus that appears to be a  rupture chordae. Mild thickening of the mitral valve leaflet. Mild  calcification of the mitral valve leaflet. There is mild mitral annular  calcification present. No evidence of  mitral valve stenosis.  5. The tricuspid valve is normal in structure.  6. The aortic root is normal in size and structure.  7. There is moderate dilatation of the aortic root measuring 41 mm.  8. Moderate hypokinesis  of the left ventricular inferior wall.  9. Severe akinesis of the left ventricular anteroseptal wall.  10. Severe akinesis of the left ventricular apical segment.   CATH:02/10/2019  RCA Prox RCA lesion is 50% stenosed. Prox RCA to Mid RCA lesion is 80% stenosed. Mid RCA lesion is 90% stenosed. Mid RCA to Dist RCA lesion is 100% stenosed.  ----- Part of the PDA and PL system was filled via right to right and left-to-right collaterals.  Cx-OM: Ost Cx to Prox Cx lesion is 45% stenosed with 90% stenosed side branch in LAV Groove.  ------ 3rd LPL-1 lesion is 80% stenosed. 3rd LPL-2 lesion is 99% stenosed.  Prox LAD to Mid LAD lesion is 90% stenosed with 55% stenosed side branch in Ost 2nd Diag.  Mid-distal LAD to Dist LAD lesion is 95% stenosed. Dist LAD lesion is 90% stenosed.  --  LV end diastolic pressure is moderately elevated. Consistent with acute on chronic diastolic heart failure  SUMMARY  Severe multivessel disease: 100% CTO of mid RCA, 50% proximal circumflex with 90% AV groove followed by 100% CTO of LPL (that recanalizes through left left lateral), 90 to 95% bifurcation LAD-2nd Diag followed by 60% and then tandem 99 to 90% lesions in the mid to distal LAD. -- >Minimal good PCI or CABG options  Moderately elevated LVEDP consistent with acute diastolic heart failure  RECOMMENDATIONS  The patient will be transferred to a telemetry bed. I plan to restart IV heparin to run for 48 hours for MI. (Will restart 8 hours post sheath removal)  Continue aggressive medical management.  I discussed the  findings with the patient's power of attorney/brother and sister-in-law: Options going forward would be medical management, CABG, random PCI. After discussing with the brother: ? It is clear that PCI is not likely a good option as it would not be a good candidate for dual antiplatelet therapy with frequent falls, he is also was anemic.  ? CABG would not be something that they  would be interested in offering based on his dementia, renal insufficiency and Parkinson's. Would not likely recover.  ? Best option going forward would be medical management.   With renal insufficiency, we will hydrate him, but he would likely require diuresis and standing diuretic during this hospitalization.  2D echo was ordered to better assess his EF, however I suspect it to be reduced. t     Patient Profile     79 y.o. male with a hx ofcombined systolic and diastolicCHF(EF [id number]), ICM,NSTEMI 01/2019 w/ med rx for multi-vessel CAD, HTN, HLD, GERD, ruptured MV chordae, Parkinson's, dementia, thoracic aortic aneurysm,whopresented with substernal chest pain that radiated to his arm. today for the evaluation ofchest pain.Code STEMI called but canceled then. His EKG changes and elevated trop is now consistent with STEMI definition, however Trope elevation is not as high as expected with current extensive EKG changes. Not a candidate for intervention due to comorbidities and is getting medical management. (01/19/2020: Now done with 72 h of Heparin. Chest pain improved)  Assessment & Plan    Principal Problem:   Acute ST elevation myocardial infarction (STEMI) of anterior wall (HCC) Active Problems:   Acute on chronic combined systolic and diastolic HF (heart failure) (HCC)   Dementia due to Parkinson's disease with behavioral disturbance (HCC)   Hyperlipidemia   Essential hypertension   CKD stage G3a/A2, GFR 45-59 and albumin creatinine ratio 30-299 mg/g   Cardiomyopathy EF 30-35%   Parkinson's disease (HCC)   Hypothyroidism   Coronary artery disease, occlusive    STEMI: Patient with Hx of severe multivessel disease. Presented with chest pain. EKG with ST elevation. STEMI called but canceled  after reviewing previous heart catheterization note indicating the severity of disease and poor PCI targets.  His EKG with ST elevation to lateral and anteroseptal  leads and inferior lead involevment.   However troponin elevation levels are not consistent with STEMI (peak 1229).  He is a poor candidate for intervention given age, comorbidities: dementia, parkinson, Hx of anemia and high risk for fallsand bleeding(and not a good candidate for long term DAP after PCI/stent).  He continued to have chest pain for first 48 hours but despite recurrent pain, troponin levels trended down.   -Continue ASA and plavix unintrupted for a month then stop ASA and keep intrupted plavix for 6 months. -Completed 72 hours of heparin -Continue Coreg 12.5 mg BID -Continue Amlodipine 10 mg QD -Continue Ranexa 500 mg BID (started 2/4) -Off Nitro paste and resumed home Isordil (2/5).  Will increase dose to 30 mg TID (2/7) given chest pain this morning.  Will check troponins  Acute on Chronic combined systolic and diastolic HF: Ischemic cardiomyopathy.  Echo 01/17/2020 EF 35-40%. Regional wall motion abnormality that is suggestive of LAD involvement, reported no change comparing to prior study 2020 -Interestingly, despite new troponin elevations and EKG changes, EF appears to be relatively stable.  I/O not recorded.  On 2L nasal O2, O2 sat stable. - Continue PO Lasix 40 mg BID --> chest x-ray on admission did not show significant pulmonary edema.  Lungs are relatively clear. -BMP  daily -Replace electrolyte as needed -Continue Coreg -No ACE inhibitor until BP allows -Strict I/O -Weight daily -fluid restriction  Other chronic medical conditions: Hypothyroidism:  -Continue home Levothyroxine  Parkinson: Dementia: -Continue home meds: L-dopa Carbidopa, Donepezil, Razagiline, PRN Zolpidem at night for insomnia  This clearly as another level of complexity to his care as he intermittently has spells of lucidity worried will feel chest pain, but otherwise is confused. Patient is stable and calm today  Urinary incontinency:  -Continue home oxybutynin  L arm/foot pain: reports  fall at home out of bed and arm/foot pain since that time.  Will check x-rays  Disposition:  PT/OT recommended SNF.  Social work consulted for placement  For questions or updates, please contact [place] Please consult [email or web address] for contact info under       Signed, Donato Heinz, MD  01/21/2020, 9:39 AM

## 2020-01-21 NOTE — Progress Notes (Signed)
Per conversation with patient brother Gunnar Razzak at [phone], stated patient lives at home with wife and son whom are both disabled. Confirmed patient has fallen several times at home requiring EMS/fire department to assist him from floor. Informed [facility] currently managing pain, monitoring VS and offering comfort.

## 2020-01-21 NOTE — Progress Notes (Signed)
RN checked on patient during shift report and after. Patient resting and RN woke up patient to assess breathing. Patient is breathing fine, but may have had periods of apnea during sleep.

## 2020-01-21 NOTE — Plan of Care (Signed)
  Problem: Education: Goal: Knowledge of General Education information will improve Description Including pain rating scale, medication(s)/side effects and non-pharmacologic comfort measures Outcome: Progressing   Problem: Health Behavior/Discharge Planning: Goal: Ability to manage health-related needs will improve Outcome: Progressing   

## 2020-01-21 NOTE — Progress Notes (Signed)
    S:  CTSP 2/2 development of N/V after drinking a can of sprite with his pills.  Following this, RN noted drop in O2 sats into the mid-high 80's.  He then experienced N/V.  Zofran has been given.  On O2 3lpm, O2 sat currently mid-high 90's.  Pt cont to c/o L shoulder pain/tenderness along w/ fairly focal left chest pain occurring @ the left lower sternal border and is worsened slightly with palpation.  HsTrop from earlier this AM down from 2/4 value @ 336.    O:   Vitals:   01/21/20 1200 01/21/20 1220  BP: 129/74 139/74  Pulse: 81 80  Resp: 20 11  Temp:    SpO2: 92% 97%  Pleasant, NAD.  AAOx3.  Some confusion re: series of events today.  Lungs, diminished bilat - poor effort. Cor RRR, no murmurs. Chest wall is tender just lateral to LLSB over about a 2 inch in diameter area.  No masses or lesions present.  Abd soft, NT/ND/BS+x4. Ext No CCE.  Tele - RSR w/o ectopy currently HsTrop @ 11:55 336 (down from 677 on 2/4).  L upper extremity films:  EXAM: LEFT ELBOW - 2 VIEW  COMPARISON:  None.  FINDINGS: There is no evidence of fracture, dislocation, or joint effusion. Mild elbow joint arthrosis. Soft tissues are unremarkable.  IMPRESSION: No fracture or dislocation of the left elbow. Mild arthrosis. No elbow joint effusion. _____________   Jasmine December: LEFT SHOULDER  COMPARISON:  None.  FINDINGS: There is no evidence of fracture or dislocation. There is no evidence of arthropathy or other focal bone abnormality. Soft tissues are unremarkable.  IMPRESSION: No fracture or dislocation of the left shoulder. Joint spaces are Preserved. _____________   EXAM: LEFT WRIST - 2 VIEW  COMPARISON:  None.  FINDINGS: No fracture or dislocation of the left wrist. Joint spaces are preserved. The carpus is normally aligned. Soft tissues are unremarkable.  IMPRESSION: No fracture or dislocation of the left wrist. The carpus is normally Aligned.  A/P:  1.  Acute hypoxia:   Occurred after taking AM meds and drinking a can of sprite.  This was followed by n/v x 1.  Currently on O2 @ 3lpm and in no distress.  Sats mid to high 90's.  Will f/u cxr as breath sounds diminished.  2.  Left shoulder pain:  Xrays neg.  Will add oral pain meds as IV morphine may be playing a role in nausea.  3.  N/V:  X 1 after drinking can of sprite.  Resolved w/ zofran currently.  4.  CAD/Chest pain:  Pain is worsened w/ palpation.  HsTrop trending down from prior despite prolonged c/p this AM.  Hemodynamically stable.  See AM note re: CAD and presentation.  Murray Hodgkins, NP

## 2020-01-21 NOTE — Progress Notes (Signed)
During shift assessment patient complain of chest pain (able to confirm similar to prior chest pain). Reported sharp pain 4/10 scale in left upper chest. 2L O2 given for comfort  Paged NP [facility] at [phone]. sharp chest pain 4/10, BP 122/84 HR 78.oriented at baseline.   PRN nitro given at 0852. After administration patient stated med not effective and requested med given before in IV (referring to morphine).   Per callback from [place] ok to give dose of Morphine given stable BP. Informed [facility] EKG complete. Informed patient in obvious discomfort and pain different from shoulder/arm pain patient previously had.   0920 patient resting comfortably  1021 spoke with Gardiner Rhyme at bedside and informed of left arm, shoulder and leg pain. Informed patient able to recall that he has fallen from bed at home a couple of times requiring EMS/fire department to assist from floor. Per Gardiner Rhyme ok to give all cardiac meds with current BP. Informed patient pain has decreased since given morphine.   Left unit for xray at 1107 with transport.   After med administration at 1223 patient report shoulder, arm and chest pain; more ill appearing. Sharp pain in chest. Reported feeling lousy then O2 in mid to high 80's. 3L on nasal cannula. Patient then vomited.   Paged NP Sharolyn Douglas at 1250.Pt. report feeling lousy,O2 in mid 80's, 3L [place] given. Vomited after med admin.Giving PRN Zofran. please call back. Upon callback will come to bedside.    [place] at bedside. Per Sharolyn Douglas ok to give morphine; monitor BP.     1627 patient report med given helped relieve pain.    Offered assistance with eating dinner at 1739. Offered to order another dinner tray patient preferred; declined.   Paged [facility] at [employer]. Ok to give Lasix and Coreg now? 0800 dose not given until 1230 today.Want to ensure doses are not too close together. Upon callback; ok to give.

## 2020-01-22 LAB — BASIC METABOLIC PANEL
Anion gap: 10 (ref 5–15)
BUN: 56 mg/dL — ABNORMAL HIGH (ref 8–23)
CO2: 27 mmol/L (ref 22–32)
Calcium: 9.2 mg/dL (ref 8.9–10.3)
Chloride: 98 mmol/L (ref 98–111)
Creatinine, Ser: 1.88 mg/dL — ABNORMAL HIGH (ref 0.61–1.24)
GFR calc Af Amer: 39 mL/min — ABNORMAL LOW (ref >60)
GFR calc non Af Amer: 34 mL/min — ABNORMAL LOW (ref >60)
Glucose, Bld: 151 mg/dL — ABNORMAL HIGH (ref 70–99)
Potassium: 4.4 mmol/L (ref 3.5–5.1)
Sodium: 135 mmol/L (ref 135–145)

## 2020-01-22 LAB — SARS CORONAVIRUS 2 (TAT 6-24 HRS): SARS Coronavirus 2: NEGATIVE

## 2020-01-22 NOTE — Progress Notes (Addendum)
Progress Note  Patient Name: [name] Date of Encounter: 01/22/2020  Primary Cardiologist: Carlyle Dolly, MD     Patient Profile:   [name] is a 79 y.o. male with a hx of combined systolic and diastolic CHF (EF 99991111 on echo 02/2019), ICM, NSTEMI 01/2019 w/ med rx for multi-vessel CAD, HTN, HLD, GERD, ruptured MV chordae, Parkinson's, dementia, thoracic aortic aneurysm, who presented with substernal chest pain that radiated to his arm. ST elevation on inf and ant leads. Code STEMI called but canceled then. His EKG changes and elevated trop is now consistent with STEMI definition, however Trope elevation is not as high as expected with current extensive EKG changes. He has not been a candidate for intervention due to comorbidities and is getting medical management. Had some continuous chest pain in first 48 hours but Troponin trended down. 01/19/2020: Now done with 72 h of Heparin. He has had some shoulder pain that improved. (X ray negative for fx of dislocation). No more chest pain.  Subjective   Patient was seen and evaluated at bedside on morning rounds. No acute events overnight. No chest pain but has complained of some shoulder pain yesterday that improved but still present. He did not report nausea or vomiting. He mentions that he would like to go home instead of nursing facility.   Inpatient Medications    Scheduled Meds: . acidophilus  1 capsule Oral BID  . amLODipine  10 mg Oral Daily  . aspirin EC  81 mg Oral Daily  . atorvastatin  40 mg Oral q1800  . carbidopa-levodopa  1 tablet Oral BID  . carvedilol  12.5 mg Oral BID WC  . clopidogrel  75 mg Oral Daily  . donepezil  10 mg Oral QHS  . DULoxetine  60 mg Oral QHS  . enoxaparin (LOVENOX) injection  40 mg Subcutaneous Q24H  . ferrous sulfate  325 mg Oral Q breakfast  . folic acid  1 mg Oral QHS  . furosemide  40 mg Oral BID  . hydrOXYzine  25 mg Oral TID  . isosorbide dinitrate  30 mg Oral TID  . levothyroxine   100 mcg Oral QAC breakfast  . multivitamin with minerals  1 tablet Oral q morning - 10a  . oxybutynin  5 mg Oral BID  . ranolazine  500 mg Oral BID  . rasagiline  1 mg Oral Daily  . simethicone  80 mg Oral BID  . sodium chloride flush  3 mL Intravenous Q12H   Continuous Infusions: . sodium chloride     PRN Meds: sodium chloride, acetaminophen, ALPRAZolam, morphine injection, Muscle Rub, nitroGLYCERIN, ondansetron (ZOFRAN) IV, polyvinyl alcohol, sodium chloride flush, traMADol, triamcinolone ointment, zolpidem   Vital Signs    Vitals:   01/22/20 0649 01/22/20 0650 01/22/20 0651 01/22/20 0652  BP:      Pulse: 70 65 69 63  Resp:      Temp:      TempSrc:      SpO2: 92% 97% 96% 94%  Weight:      Height:        Intake/Output Summary (Last 24 hours) at 01/22/2020 0708 Last data filed at 01/22/2020 [id number] Gross per 24 hour  Intake 1180 ml  Output 200 ml  Net 980 ml   Last 3 Weights 01/22/2020 01/21/2020 01/20/2020  Weight (lbs) 189 lb 6 oz 193 lb 2 oz 192 lb 7.4 oz  Weight (kg) 85.9 kg 87.6 kg 87.3 kg      Telemetry  Sinus rhythm, one PAC noted  - Personally Reviewed  ECG    Not done - Personally Reviewed  Physical Exam   GEN: No acute distress.   Neck: No JVD Cardiac: RRR, systolic murmurs at M area, no rubs, or gallops.  Respiratory: Clear to auscultation bilaterally. GI: Soft, nontender, non-distended  MS: No edema; No deformity. Neuro:  Nonfocal  Psych: Normal affect   Labs    High Sensitivity Troponin:   Recent Labs  Lab 01/16/20 1612 01/16/20 1843 01/16/20 2139 01/18/20 1045 01/21/20 1155  TROPONINIHS 1,229* 1,193* 1,199* 677* 336*      Chemistry Recent Labs  Lab 01/16/20 1412 01/16/20 2334 01/20/20 0414 01/21/20 0512 01/22/20 0400  NA 137   < > 134* 135 135  K 4.2   < > 3.9 4.1 4.4  CL 106   < > 99 99 98  CO2 22   < > 25 24 27   GLUCOSE 141*   < > 119* 101* 151*  BUN 33*   < > 37* 44* 56*  CREATININE 1.45*   < > 1.66* 1.71* 1.88*  CALCIUM  8.9   < > 8.7* 9.0 9.2  PROT 7.0  --   --   --   --   ALBUMIN 3.5  --   --   --   --   AST 28  --   --   --   --   ALT 12  --   --   --   --   ALKPHOS 79  --   --   --   --   BILITOT 0.5  --   --   --   --   GFRNONAA 46*   < > 39* 38* 34*  GFRAA 53*   < > 45* 44* 39*  ANIONGAP 9   < > 10 12 10    < > = values in this interval not displayed.     Hematology Recent Labs  Lab 01/18/20 0431 01/19/20 0434 01/20/20 0414  WBC 6.8 7.0 6.3  RBC 3.17* 3.32* 3.03*  HGB 10.4* 10.8* 10.0*  HCT 30.5* 32.1* 28.9*  MCV 96.2 96.7 95.4  MCH 32.8 32.5 33.0  MCHC 34.1 33.6 34.6  RDW 14.0 14.0 13.5  PLT 148* 158 159    BNPNo results for input(s): BNP, PROBNP in the last 168 hours.   DDimer No results for input(s): DDIMER in the last 168 hours.   Radiology    DG Shoulder 1V Left  Result Date: 01/21/2020 CLINICAL DATA:  Left arm pain, decreased strength EXAM: LEFT SHOULDER COMPARISON:  None. FINDINGS: There is no evidence of fracture or dislocation. There is no evidence of arthropathy or other focal bone abnormality. Soft tissues are unremarkable. IMPRESSION: No fracture or dislocation of the left shoulder. Joint spaces are preserved. Electronically Signed   By: Eddie Candle M.D.   On: 01/21/2020 12:14   DG Elbow 2 Views Left  Result Date: 01/21/2020 CLINICAL DATA:  Pain EXAM: LEFT ELBOW - 2 VIEW COMPARISON:  None. FINDINGS: There is no evidence of fracture, dislocation, or joint effusion. Mild elbow joint arthrosis. Soft tissues are unremarkable. IMPRESSION: No fracture or dislocation of the left elbow. Mild arthrosis. No elbow joint effusion. Electronically Signed   By: Eddie Candle M.D.   On: 01/21/2020 12:15   DG Wrist 2 Views Left  Result Date: 01/21/2020 CLINICAL DATA:  Pain EXAM: LEFT WRIST - 2 VIEW COMPARISON:  None. FINDINGS: No fracture or dislocation of the  left wrist. Joint spaces are preserved. The carpus is normally aligned. Soft tissues are unremarkable. IMPRESSION: No fracture or  dislocation of the left wrist. The carpus is normally aligned. Electronically Signed   By: Eddie Candle M.D.   On: 01/21/2020 12:16   DG Chest Port 1 View  Result Date: 01/21/2020 CLINICAL DATA:  Heart failure EXAM: PORTABLE CHEST 1 VIEW COMPARISON:  01/16/2020 FINDINGS: Cardiomegaly. Both lungs are clear. Numerous benign, calcified pulmonary nodules. The visualized skeletal structures are unremarkable. IMPRESSION: Cardiomegaly without acute abnormality of the lungs in AP portable projection. Electronically Signed   By: Eddie Candle M.D.   On: 01/21/2020 15:06   DG Foot 2 Views Left  Result Date: 01/21/2020 CLINICAL DATA:  Left foot pain EXAM: LEFT FOOT - 2 VIEW COMPARISON:  None. FINDINGS: No fracture or dislocation of the left foot. Mild first metatarsophalangeal and midfoot arthrosis. Small plantar and Achilles calcaneal spurs. Mild ankle mortise arthrosis. Soft tissues are unremarkable. IMPRESSION: No fracture or dislocation of the left foot.  Mild arthrosis. Electronically Signed   By: Eddie Candle M.D.   On: 01/21/2020 12:17    Cardiac Studies   ECHO 01/17/2020: IMPRESSIONS    1. Left ventricular ejection fraction, by visual estimation, is 35 to  40%. The left ventricle has low normal function. Left ventricular septal  wall thickness was moderately increased. There is moderately increased  left ventricular hypertrophy.   2. The left ventricle demonstrates regional wall motion abnormalities.  Apical akinesis with midseptal hypokinesis. No significant change in  regional wall motion abnormalities compared to exam from 02/12/2019. Side by  side comparison of images performed.   3. Calcifed, mobile mass in the left ventricle. This has been previously  documented on echocardiograms serially, and is felt to represent a  ruptured chordae tendinae.   4. Global right ventricle has normal systolic function.The right  ventricular size is normal. No increase in right ventricular wall  thickness.    5. Left atrial size was normal.   6. Right atrial size was normal.   7. Mild mitral annular calcification.   8. The mitral valve is normal in structure. Mild mitral valve  regurgitation. No evidence of mitral stenosis.   9. The tricuspid valve is normal in structure. Tricuspid valve  regurgitation is not demonstrated.  10. The aortic valve was not well visualized. Aortic valve regurgitation  is mild. Mild to moderate aortic valve sclerosis/calcification without any  evidence of aortic stenosis.  11. The pulmonic valve was not well visualized. Pulmonic valve  regurgitation is trivial.  12. The inferior vena cava is normal in size with greater than 50%  respiratory variability, suggesting right atrial pressure of 3 mmHg.  13. There is borderline dilatation of the aortic root measuring 39 mm.    ECHO: 02/12/2019  1. The left ventricle has moderate-severely reduced systolic function,  with an ejection fraction of 30-35%. The cavity size was normal. There is  asymmetric septal hypertrophy.. Left ventricular diastolic Doppler  parameters are consistent with impaired  relaxation Elevated mean left atrial pressure.   2. The right ventricle has normal systolic function. The cavity was  normal. There is no increase in right ventricular wall thickness.   3. The aortic valve has an indeterminant number of cusps Moderate  thickening of the aortic valve Moderate calcification of the aortic valve.  Aortic valve regurgitation is mild to moderate by color flow Doppler. mild  stenosis of the aortic valve.  Moderate aortic annular calcification noted.  4. There is a mobile density within the MV apparatus that appears to be a  rupture chordae. Mild thickening of the mitral valve leaflet. Mild  calcification of the mitral valve leaflet. There is mild mitral annular  calcification present. No evidence of  mitral valve stenosis.   5. The tricuspid valve is normal in structure.   6. The aortic root is  normal in size and structure.   7. There is moderate dilatation of the aortic root measuring 41 mm.   8. Moderate hypokinesis of the left ventricular inferior wall.   9. Severe akinesis of the left ventricular anteroseptal wall.  10. Severe akinesis of the left ventricular apical segment.    CATH: 02/10/2019  RCA Prox RCA lesion is 50% stenosed. Prox RCA to Mid RCA lesion is 80% stenosed. Mid RCA lesion is 90% stenosed. Mid RCA to Dist RCA lesion is 100% stenosed.  ----- Part of the PDA and PL system was filled via right to right and left-to-right collaterals.  Cx-OM: Ost Cx to Prox Cx lesion is 45% stenosed with 90% stenosed side branch in LAV Groove.  ------ 3rd LPL-1 lesion is 80% stenosed. 3rd LPL-2 lesion is 99% stenosed.  Prox LAD to Mid LAD lesion is 90% stenosed with 55% stenosed side branch in Ost 2nd Diag.  Mid-distal LAD to Dist LAD lesion is 95% stenosed. Dist LAD lesion is 90% stenosed.  --  LV end diastolic pressure is moderately elevated. Consistent with acute on chronic diastolic heart failure   SUMMARY  Severe multivessel disease: 100% CTO of mid RCA, 50% proximal circumflex with 90% AV groove followed by 100% CTO of LPL (that recanalizes through left left lateral), 90 to 95% bifurcation LAD-2nd Diag followed by 60% and then tandem 99 to 90% lesions in the mid to distal LAD. -- >  Minimal good PCI or CABG options  Moderately elevated LVEDP consistent with acute diastolic heart failure   RECOMMENDATIONS 1. The patient will be transferred to a telemetry bed.  I plan to restart IV heparin to run for 48 hours for MI.  (Will restart 8 hours post sheath removal) 2. Continue aggressive medical management. 3. I discussed the findings with the patient's power of attorney/brother and sister-in-law:  Options going forward would be medical management, CABG, random PCI.  After discussing with the brother: ? It is clear that PCI is not likely a good option as it would not be a  good candidate for dual antiplatelet therapy with frequent falls, he is also was anemic.  ? CABG would not be something that they would be interested in offering based on his dementia, renal insufficiency and Parkinson's.  Would not likely recover.   ? Best option going forward would be medical management.    With renal insufficiency, we will hydrate him, but he would likely require diuresis and standing diuretic during this hospitalization.  2D echo was ordered to better assess his EF, however I suspect it to be reduced.  t       Patient Profile     79 y.o. male with a hx of combined systolic and diastolic CHF (EF 99991111 on echo 02/2019), ICM, NSTEMI 01/2019 w/ med rx for multi-vessel CAD, HTN, HLD, GERD, ruptured MV chordae, Parkinson's, dementia, thoracic aortic aneurysm, who presented with substernal chest pain that radiated to his arm. ST elevation on inf and ant leads. Code STEMI called but canceled then. His EKG changes and elevated trop is now consistent with STEMI definition, however Trope elevation  is not as high as expected with current extensive EKG changes. He has not been a candidate for intervention due to comorbidities and is getting medical management. Had some continuous chest pain in first 48 hours but Troponin trended down. 01/19/2020: Now done with 72 h of Heparin. He has had some shoulder pain that improved. (X ray negative for fx of dislocation). No more chest pain.  Assessment & Plan    STEMI: Patient with Hx of severe multivessel disease. Presented with chest pain. EKG with ST elevation. STEMI called but canceled  after reviewing previous heart catheterization note indicating the severity of disease and poor PCI targets.  His EKG with ST elevation to lateral and anteroseptal leads and inferior lead involevment.   However troponin elevation levels are not consistent with STEMI (peak 1229).  He is a poor candidate for intervention given age, comorbidities: dementia, parkinson,  Hx of anemia and high risk for falls and bleeding (and not a good candidate for long term DAP after PCI/stent). Troponin levels trended down despite continuous chest pain initially. Completed 72 hours of heparin.  The chest pain now resolved and shoulder pain improved.  SBPs at 110-120s  -Continue ASA and plavix unintrupted for a month then stop ASA and keep intrupted plavix for 6 months. -Continue Coreg 12.5 mg BID -Continue Amlodipine 10 mg QD -Continue Ranexa 500 mg BID (started 2/4) -Off Nitro paste and resumed home Isordil (2/5).  Increase dose to 30 mg TID (2/7) given chest pain. Chest pain now resolved. -BMP daily when in hospital and repeat in a week after discharge -Patient can be be discharged to continue medical management as he is not a good candidate for any cardiac intervention and his chest pain is controlled. I talked to patient's wife this morning. She agrees with discharging home with home PT PT, she prefers discharging home because she believes insurance will not cover that. She mentions she can take care of him at home if insurance won't pay for SNF. I talked to case Freight forwarder and Education officer, museum. Given the concern about safety at home per last PT evaluation, plan is to repeat PT OT eval  today, contacting insurance company regarding coverage and decide about where to discharge today.   -PT OT eval and treat -Appreciate social worker follow up for dispo plan as above   Acute on Chronic combined systolic and diastolic HF: Stable and diuresed well. Ischemic cardiomyopathy.  Echo 01/17/2020 EF 35-40%. Regional wall motion abnormality that is suggestive of LAD involvement, reported no change comparing to prior study 2020 Interestingly, despite new troponin elevations and EKG changes, EF appears to be relatively stable.  I/O not recorded (net negative is positive despite diuresis).  Required up to 3L nasal O2. Not saturating at 92% on room air.  Chest x-ray on admission did not show  significant pulmonary edema.  Lungs are relatively clear.  His Cr increased to 1.8. Can continue po diuretics for now but will monitor kidney function and repeat BMP after discharge to adjust diuretic as needed.   -Continue PO Lasix 40 mg BID --> -BMP daily when in hospital and repeat in a week after discharge -Replace electrolyte as needed -Continue Coreg -No ACE inhibitor until BP allows and AKI resolves -Strict I/O -Weight daily -fluid restriction   Other chronic medical conditions: Hypothyroidism:  -Continue home Levothyroxine   Parkinson: Dementia: -Continue home meds: L-dopa Carbidopa, Donepezil, Razagiline, PRN Zolpidem at night for insomnia  This clearly as another level of complexity to his  care as he intermittently has spells of lucidity worried will feel chest pain, but otherwise is confused. Patient is stable and calm today   Urinary incontinency:  -Continue home oxybutynin   L arm and shoulder pain: Tenderness on shoulder. With Hx of falls at home out of bed and arm/foot pain since that time. Left upper extremity x-rays performed and were unremarkable. Pain improved. Can continue pain control for MSK pain.  For questions or updates, please contact [place] Please consult [email or web address] for contact info under        Signed, Dewayne Hatch, MD  01/22/2020, 7:08 AM    Agree with note by Dr. Myrtie Hawk  Patient admitted with chest pain.Nonrevascularizable three-vessel CAD by cath last year.  He is a DNR with multiple other comorbidities not a candidate for intervention.  He has moderate LV dysfunction with an EF of 35 to 40%.  He is not a candidate for antiplatelet therapy given multiple falls.  His troponins rose to 1200.  He did have EKG changes.  Is placed on heparin for 72 hours.  He said no recurrent chest pain but has complained of some shoulder and elbow pain.  His exam is benign today.  He stable for discharge.  We will arrange in-home rehab/OT PT.   Follow-up with Dr. Harl Bowie as an outpatient.  Lorretta Harp, M.D., [place], [facility], [place] [address] [place], [place]  [address]  [phone] 01/22/2020 11:17 AM

## 2020-01-22 NOTE — TOC Initial Note (Signed)
Transition of Care [facility]) - Initial/Assessment Note    Patient Details  Name: [name] MRN: [id number] Date of Birth: [birth year]  [facility]) CM/SW Contact:    Alberteen Sam, [place] Phone Number: [phone] 01/22/2020, 12:16 PM  Clinical Narrative:                  CSW spoke with patient's spouse [name] regarding discharge planning and SNF recommendation. Addressed her concerns about insurance coverage. She is agreeable for patient to be faxed out to [facility] for bed offers.   CSW has faxed out referrals pending bed offers at this time. MD updated with dc plan and informed of needed covid test to be ordered.   CSW has initiated insurance auth with [employer]   Barriers to dc: Pending bed offer Pending insurance auth Pending covid test results  Expected Discharge Plan: Skilled Nursing Facility Barriers to Discharge: Insurance Authorization, SNF Pending bed offer   Patient Goals and CMS Choice   CMS Medicare.gov Compare Post Acute Care list provided to:: Patient Represenative (must comment)(spouse Louise) Choice offered to / list presented to : Spouse  Expected Discharge Plan and Services Expected Discharge Plan: [place] Acute Care Choice: [place] Living arrangements for the past 2 months: Single Family Home                                      Prior Living Arrangements/Services Living arrangements for the past 2 months: Single Family Home Lives with:: Spouse Patient language and need for interpreter reviewed:: Yes Do you feel safe going back to the place where you live?: No   needs short term rehab  Need for Family Participation in Patient Care: Yes (Comment) Care giver support system in place?: Yes (comment)   Criminal Activity/Legal Involvement Pertinent to Current Situation/Hospitalization: No - Comment as needed  Activities of Daily Living       Permission Sought/Granted Permission sought to share information with : Case Manager, Customer service manager, Family Supports Permission granted to share information with : Yes, Verbal Permission Granted  Share Information with NAME: [name]  Permission granted to share info w AGENCY: SNFs  Permission granted to share info w Relationship: spuse  Permission granted to share info w Contact Information: [phone]  Emotional Assessment Appearance:: Appears stated age     Orientation: : Oriented to Self Alcohol / Substance Use: Not Applicable Psych Involvement: No (comment)  Admission diagnosis:  Unstable angina (HCC) [I20.0] Acute coronary syndrome [facility]) [I24.9] Patient Active Problem List   Diagnosis Date Noted  . Coronary artery disease, occlusive 01/20/2020  . Unstable angina ([place]) 01/16/2020  . Acute ST elevation myocardial infarction (STEMI) of anterior wall ([place]) 01/16/2020  . ARF (acute renal failure) ([place]) 07/21/2019  . Nausea vomiting and diarrhea 07/21/2019  . Dehydration 07/21/2019  . Upper GI bleed 07/21/2019  . Cardiomyopathy EF 30-35%   . Parkinson's disease ([place])   . Hypothyroidism   . Elevated BUN   . Lobar pneumonia ([place]) 02/10/2019  . Acute on chronic respiratory failure with hypoxia ([place]) 02/10/2019  . NSTEMI (non-ST elevated myocardial infarction) ([place]) 02/10/2019  . Acute on chronic combined systolic and diastolic CHF (congestive heart failure) ([place]) 02/10/2019  . Non-STEMI (non-ST elevated myocardial infarction) ([place]) 02/10/2019  . Chest pain 02/09/2019  . Acute on chronic systolic CHF (congestive heart  failure) ([place]) 02/09/2019  . Acute on chronic combined systolic and diastolic HF (heart failure) ([place])   . Dementia due to Parkinson's disease with behavioral disturbance ([place])   . Hyperlipidemia   . Essential hypertension   . CKD stage G3a/A2, GFR 45-59 and albumin creatinine ratio 30-299 mg/g   . Elevated troponin   . Hypokalemia   . SOB  (shortness of breath) 06/08/2018  . Symptomatic stenosis of right carotid artery 10/07/2016  . Preoperative cardiovascular examination 09/02/2016  . Abnormal EKG 09/02/2016  . Murmur 09/02/2016  . Carotid artery stenosis with cerebral infarction ([place]) 09/02/2016   PCP:  Celene Squibb, MD Pharmacy:   [facility] - [place], [place] - [address] [place] [address] Phone: [phone] Fax: [phone]  [place], [place] [address] Phone: [phone] Fax: [phone]     Social Determinants of Health (SDOH) Interventions    Readmission Risk Interventions Readmission Risk Prevention Plan 07/24/2019  Transportation Screening Complete  Medication Review ([place]) Complete  HRI or [place] Not Complete  SW Recovery Care/Counseling Consult Complete  Palliative Care Screening Not Complete  Skilled Nursing Facility Complete  Some recent data might be hidden

## 2020-01-22 NOTE — Care Management Important Message (Signed)
Important Message  Patient Details  Name: Arkell Feige MRN: [id number] Date of Birth: March 25, 1941   Medicare Important Message Given:  Yes     Shelda Altes 01/22/2020, 3:37 PM

## 2020-01-22 NOTE — NC FL2 (Signed)
[place] LEVEL OF CARE SCREENING TOOL     IDENTIFICATION  Patient Name: [name] Birthdate: [birth year] Sex: male Admission Date (Current Location): 01/16/2020  [facility] and [place] Number:  Herbalist and Address:  [facility] [place], [place] [address], [place], [place] [address]      Provider Number: [id number]  Attending Physician Name and Address:  Leonie Man, MD  Relative Name and Phone Number:  [name] (spouse) [phone]    Current Level of Care: Hospital Recommended Level of Care: [place] Prior Approval Number:    Date Approved/Denied:   PASRR Number: [id number]  Discharge Plan: SNF    Current Diagnoses: Patient Active Problem List   Diagnosis Date Noted  . Coronary artery disease, occlusive 01/20/2020  . Unstable angina ([place]) 01/16/2020  . Acute ST elevation myocardial infarction (STEMI) of anterior wall ([place]) 01/16/2020  . ARF (acute renal failure) ([place]) 07/21/2019  . Nausea vomiting and diarrhea 07/21/2019  . Dehydration 07/21/2019  . Upper GI bleed 07/21/2019  . Cardiomyopathy EF 30-35%   . Parkinson's disease ([place])   . Hypothyroidism   . Elevated BUN   . Lobar pneumonia ([place]) 02/10/2019  . Acute on chronic respiratory failure with hypoxia ([place]) 02/10/2019  . NSTEMI (non-ST elevated myocardial infarction) ([place]) 02/10/2019  . Acute on chronic combined systolic and diastolic CHF (congestive heart failure) ([place]) 02/10/2019  . Non-STEMI (non-ST elevated myocardial infarction) ([place]) 02/10/2019  . Chest pain 02/09/2019  . Acute on chronic systolic CHF (congestive heart failure) ([place]) 02/09/2019  . Acute on chronic combined systolic and diastolic HF (heart failure) ([place])   . Dementia due to Parkinson's disease with behavioral disturbance ([place])   . Hyperlipidemia   . Essential hypertension   . CKD stage G3a/A2, GFR 45-59 and albumin creatinine ratio 30-299 mg/g   . Elevated troponin   .  Hypokalemia   . SOB (shortness of breath) 06/08/2018  . Symptomatic stenosis of right carotid artery 10/07/2016  . Preoperative cardiovascular examination 09/02/2016  . Abnormal EKG 09/02/2016  . Murmur 09/02/2016  . Carotid artery stenosis with cerebral infarction ([place]) 09/02/2016    Orientation RESPIRATION BLADDER Height & Weight     Self  Normal Incontinent, External catheter Weight: 189 lb 6 oz (85.9 kg) Height:  5\' 8"  (172.7 cm)  BEHAVIORAL SYMPTOMS/MOOD NEUROLOGICAL BOWEL NUTRITION STATUS      Continent Diet(see discharge summary)  AMBULATORY STATUS COMMUNICATION OF NEEDS Skin   Extensive Assist Verbally                         Personal Care Assistance Level of Assistance  Bathing, Feeding, Dressing, Total care Bathing Assistance: Limited assistance Feeding assistance: Limited assistance Dressing Assistance: Maximum assistance Total Care Assistance: Maximum assistance   Functional Limitations Info  Sight, Hearing, Speech Sight Info: Adequate Hearing Info: Adequate Speech Info: Adequate    SPECIAL CARE FACTORS FREQUENCY  PT (By licensed PT), OT (By licensed OT)     PT Frequency: min 5x weekly OT Frequency: min 5x weekly            Contractures Contractures Info: Not present    Additional Factors Info  Code Status, Allergies Code Status Info: DNR Allergies Info: No Known Allergies           Current Medications (01/22/2020):  This is the current hospital active medication list Current Facility-Administered Medications  Medication Dose Route Frequency Provider Last Rate Last Admin  .  0.9 %  sodium chloride infusion  250 mL Intravenous PRN Barrett, Rhonda G, PA-C      . acetaminophen (TYLENOL) tablet 650 mg  650 mg Oral Q4H PRN Barrett, Rhonda G, PA-C   650 mg at 01/20/20 0942  . acidophilus (RISAQUAD) capsule 1 capsule  1 capsule Oral BID Barrett, Rhonda G, PA-C   1 capsule at 01/22/20 [id number]  . ALPRAZolam Duanne Moron) tablet 0.25 mg  0.25 mg Oral BID PRN  Marykay Lex, MD   0.25 mg at 01/21/20 0357  . amLODipine (NORVASC) tablet 10 mg  10 mg Oral Daily Leonie Man, MD   10 mg at 01/22/20 [id number]  . aspirin EC tablet 81 mg  81 mg Oral Daily Donato Heinz, MD   81 mg at 01/22/20 [id number]  . atorvastatin (LIPITOR) tablet 40 mg  40 mg Oral q1800 Barrett, Rhonda G, PA-C   40 mg at 01/21/20 1846  . carbidopa-levodopa (SINEMET IR) 25-100 MG per tablet immediate release 1 tablet  1 tablet Oral BID Barrett, Evelene Croon, PA-C   1 tablet at 01/22/20 0921  . carvedilol (COREG) tablet 12.5 mg  12.5 mg Oral BID WC Barrett, Rhonda G, PA-C   12.5 mg at 01/22/20 0620  . clopidogrel (PLAVIX) tablet 75 mg  75 mg Oral Daily Leonie Man, MD   75 mg at 01/22/20 [id number]  . donepezil (ARICEPT) tablet 10 mg  10 mg Oral QHS Barrett, Rhonda G, PA-C   10 mg at 01/21/20 2147  . DULoxetine (CYMBALTA) DR capsule 60 mg  60 mg Oral QHS Barrett, Rhonda G, PA-C   60 mg at 01/21/20 2146  . enoxaparin (LOVENOX) injection 40 mg  40 mg Subcutaneous Q24H Leonie Man, MD   40 mg at 01/21/20 2146  . ferrous sulfate tablet 325 mg  325 mg Oral Q breakfast Barrett, Evelene Croon, PA-C   325 mg at 01/22/20 [id number]  . folic acid (FOLVITE) tablet 1 mg  1 mg Oral QHS Barrett, Rhonda G, PA-C   1 mg at 01/21/20 2147  . furosemide (LASIX) tablet 40 mg  40 mg Oral BID Leonie Man, MD   40 mg at 01/22/20 [id number]  . hydrOXYzine (ATARAX/VISTARIL) tablet 25 mg  25 mg Oral TID Barrett, Evelene Croon, PA-C   25 mg at 01/22/20 [id number]  . isosorbide dinitrate (ISORDIL) tablet 30 mg  30 mg Oral TID Donato Heinz, MD   30 mg at 01/22/20 0920  . levothyroxine (SYNTHROID) tablet 100 mcg  100 mcg Oral QAC breakfast Barrett, Evelene Croon, PA-C   100 mcg at 01/22/20 0620  . morphine 2 MG/ML injection 2 mg  2 mg Intravenous Q2H PRN Leonie Man, MD   2 mg at 01/21/20 1326  . multivitamin with minerals tablet 1 tablet  1 tablet Oral q morning - 10a Barrett, Evelene Croon, PA-C   1 tablet at 01/22/20 [id number]  . Muscle  Rub CREA   Topical PRN Pixie Casino, MD   Given at 01/21/20 1236  . nitroGLYCERIN (NITROSTAT) SL tablet 0.4 mg  0.4 mg Sublingual Q5 Min x 3 PRN Barrett, Rhonda G, PA-C   0.4 mg at 01/21/20 0852  . ondansetron (ZOFRAN) injection 4 mg  4 mg Intravenous Q6H PRN Barrett, Rhonda G, PA-C   4 mg at 01/21/20 1252  . oxybutynin (DITROPAN) tablet 5 mg  5 mg Oral BID Barrett, Rhonda G, PA-C   5 mg at 01/22/20 [id number]  . polyvinyl alcohol (LIQUIFILM  TEARS) 1.4 % ophthalmic solution 1 drop  1 drop Both Eyes PRN Pixie Casino, MD   1 drop at 01/20/20 2117  . ranolazine (RANEXA) 12 hr tablet 500 mg  500 mg Oral BID Leonie Man, MD   500 mg at 01/22/20 [id number]  . rasagiline (AZILECT) tablet 1 mg  1 mg Oral Daily Barrett, Rhonda G, PA-C   1 mg at 01/22/20 [id number]  . simethicone (MYLICON) chewable tablet 80 mg  80 mg Oral BID Barrett, Rhonda G, PA-C   80 mg at 01/22/20 [id number]  . sodium chloride flush (NS) 0.9 % injection 3 mL  3 mL Intravenous Q12H Barrett, Rhonda G, PA-C   3 mL at 01/22/20 0925  . sodium chloride flush (NS) 0.9 % injection 3 mL  3 mL Intravenous PRN Barrett, Evelene Croon, PA-C      . traMADol (ULTRAM) tablet 50 mg  50 mg Oral Q6H PRN Theora Gianotti, NP      . triamcinolone ointment (KENALOG) 0.1 % 1 application  1 application Topical BID PRN Barrett, Rhonda G, PA-C      . zolpidem (AMBIEN) tablet 5 mg  5 mg Oral QHS PRN Barrett, Evelene Croon, PA-C         Discharge Medications: Please see discharge summary for a list of discharge medications.  Relevant Imaging Results:  Relevant Lab Results:   Additional Information SSN: [id number]  Alberteen Sam, LCSW

## 2020-01-22 NOTE — Plan of Care (Signed)
  Problem: Education: Goal: Knowledge of General Education information will improve Description: Including pain rating scale, medication(s)/side effects and non-pharmacologic comfort measures Outcome: Progressing   Problem: Health Behavior/Discharge Planning: Goal: Ability to manage health-related needs will improve Outcome: Progressing   Problem: Safety: Goal: Ability to remain free from injury will improve Outcome: Progressing   

## 2020-01-22 NOTE — Progress Notes (Signed)
Physical Therapy Treatment Patient Details Name: [name] MRN: [id number] DOB: 1941-02-11 Today's Date: 01/22/2020    History of Present Illness Pt is a 79 yr old male who presented to the ED due to chest pain.  PMH but not limited to CHF, ICM w/ EF 30-35% 02/2019 echo, NSTEMI 01/2019 w/ med rx for multi-vessel CAD, HTN, HLD, GERD, ruptured MV chordae, Parkinson's, dementia, thoracic aortic aneurysm.     PT Comments    Patient seen for mobility progression. Pt in bed upon arrival and agreeable to participate in therapy. Pt perseverating on getting "lines" taken off. Pt with limited use of L UE during session due to pain and with heavy R lateral bias in sitting and standing. Max A-max A +2 needed for all mobility and Stedy standing frame utlilized for safety with OOB transfer. Continue to progress as tolerated with anticipated d/c to SNF for further skilled PT services.      Follow Up Recommendations  SNF;Supervision/Assistance - 24 hour     Equipment Recommendations  Other (comment)(defer to next venue)    Recommendations for Other Services       Precautions / Restrictions Precautions Precautions: Fall Restrictions Weight Bearing Restrictions: No    Mobility  Bed Mobility Overal bed mobility: Needs Assistance Bed Mobility: Supine to Sit;Sit to Supine     Supine to sit: Max assist;+2 for safety/equipment Sit to supine: Mod assist   General bed mobility comments: cues for sequencing/technique; assistance to scoot hips to EOB and to elevate trunnk into sitting; limited use of L UE due to pain  Transfers Overall transfer level: Needs assistance Equipment used: Rolling walker (2 wheeled) Transfers: Sit to/from Stand Sit to Stand: Max assist;From elevated surface;+2 safety/equipment;+2 physical assistance         General transfer comment: pt stood X 1 trial with use of RW; cues for positioning prior to standing; as pt was standing he extended L LE and in threfor in an  unsafe position to attempt taking steps; pt then returnded to sitting EOB and laid down onto R side; pt rested in bed briefly before using Stedy standing from for OOB transfer  Ambulation/Gait                 Stairs             Wheelchair Mobility    Modified Rankin (Stroke Patients Only)       Balance Overall balance assessment: Needs assistance Sitting-balance support: Feet supported;Single extremity supported Sitting balance-Leahy Scale: Poor Sitting balance - Comments: assistance required to maintain sitting balance EOB; pt with heavy R lateral bias   Standing balance support: Bilateral upper extremity supported;During functional activity Standing balance-Leahy Scale: Poor Standing balance comment: assist to maintain balance during transfers due to R lateral bias                            Cognition Arousal/Alertness: Awake/alert Behavior During Therapy: WFL for tasks assessed/performed Overall Cognitive Status: No family/caregiver present to determine baseline cognitive functioning                                 General Comments: Dementia at baseline; perseverating on getting "lines" off       Exercises      General Comments        Pertinent Vitals/Pain Pain Assessment: Faces Faces Pain Scale: Hurts little more Pain  Location: L UE Pain Descriptors / Indicators: Guarding;Grimacing;Sore Pain Intervention(s): Limited activity within patient's tolerance;Monitored during session;Repositioned    Home Living                      Prior Function            PT Goals (current goals can now be found in the care plan section) Acute Rehab PT Goals Patient Stated Goal: not stated Progress towards PT goals: Progressing toward goals    Frequency    Min 2X/week      PT Plan Current plan remains appropriate    Co-evaluation              AM-PAC PT "6 Clicks" Mobility   Outcome Measure  Help needed turning  from your back to your side while in a flat bed without using bedrails?: A Lot Help needed moving from lying on your back to sitting on the side of a flat bed without using bedrails?: A Lot Help needed moving to and from a bed to a chair (including a wheelchair)?: A Lot Help needed standing up from a chair using your arms (e.g., wheelchair or bedside chair)?: A Lot Help needed to walk in hospital room?: Total Help needed climbing 3-5 steps with a railing? : Total 6 Click Score: 10    End of Session Equipment Utilized During Treatment: Gait belt Activity Tolerance: Patient tolerated treatment well Patient left: with call bell/phone within reach;in chair;with chair alarm set Nurse Communication: Mobility status PT Visit Diagnosis: Other abnormalities of gait and mobility (R26.89);Pain Pain - Right/Left: Left Pain - part of body: Shoulder     Time: [id number] PT Time Calculation (min) (ACUTE ONLY): 28 min  Charges:  $Gait Training: 8-22 mins                     Earney Navy, PTA Acute Rehabilitation Services Pager: [phone] Office: [phone]     Darliss Cheney 01/22/2020, 10:44 AM

## 2020-01-22 NOTE — TOC Progression Note (Addendum)
Transition of Care [facility]) - Progression Note    Patient Details  Name: [name] MRN: [id number] Date of Birth: [birth year]  Transition of Care [facility]) CM/SW [place], [place] Phone Number: [phone] 01/22/2020, 4:09 PM  Clinical Narrative:     Patient has bed at [facility] to be accepted tomorrow, covid test result still pending and insurance auth still pending.   Patient's wife [name] updated.   Expected Discharge Plan: Skilled Nursing Facility Barriers to Discharge: Ship broker, SNF Pending bed offer  Expected Discharge Plan and Services Expected Discharge Plan: [place] Choice: [place] arrangements for the past 2 months: Single Family Home                                       Social Determinants of Health (SDOH) Interventions    Readmission Risk Interventions Readmission Risk Prevention Plan 07/24/2019  Transportation Screening Complete  Medication Review [employer]) Complete  HRI or [place] Not Complete  SW Recovery Care/Counseling Consult Complete  Palliative Care Screening Not Complete  Skilled Nursing Facility Complete  Some recent data might be hidden

## 2020-01-22 NOTE — Progress Notes (Signed)
Occupational Therapy Treatment Patient Details Name: [name] MRN: [id number] DOB: September 14, 1941 Today's Date: 01/22/2020    History of present illness Pt is a 79 yr old male who presented to the ED due to chest pain.  PMH but not limited to CHF, ICM w/ EF 30-35% 02/2019 echo, NSTEMI 01/2019 w/ med rx for multi-vessel CAD, HTN, HLD, GERD, ruptured MV chordae, Parkinson's, dementia, thoracic aortic aneurysm.    OT comments  Pt continues to present with poor balance, safety, cognition, and activity tolerance. Pt requiring Max A +2 to power up into standing with use of stedy. Pt presenting with heavy lean/push to right while sitting and in standing. Pt perseverating on topic of "getting these lines out" and going home; requiring Max cues for redirection back to task. Pt with high risk of falls and poor safety awareness. Continue to recommend dc to SNF and will continue to follow acutely as admitted.   Follow Up Recommendations  SNF;Supervision/Assistance - 24 hour    Equipment Recommendations  Other (comment)(Defer to next venue)    Recommendations for Other Services      Precautions / Restrictions Precautions Precautions: Fall Restrictions Weight Bearing Restrictions: No       Mobility Bed Mobility Overal bed mobility: Needs Assistance Bed Mobility: Supine to Sit;Sit to Supine     Supine to sit: Max assist;+2 for safety/equipment Sit to supine: Mod assist   General bed mobility comments: cues for sequencing/technique; assistance to scoot hips to EOB and to elevate trunnk into sitting; limited use of L UE due to pain  Transfers Overall transfer level: Needs assistance Equipment used: Rolling walker (2 wheeled) Transfers: Sit to/from Stand Sit to Stand: Max assist;From elevated surface;+2 safety/equipment;+2 physical assistance         General transfer comment: pt stood X 1 trial with use of RW; cues for positioning prior to standing; as pt was standing he extended L LE  and in threfor in an unsafe position to attempt taking steps; pt then returnded to sitting EOB and laid down onto R side; pt rested in bed briefly before using Stedy standing from for OOB transfer. Requiring Max A +2 for power up with stedy    Balance Overall balance assessment: Needs assistance Sitting-balance support: Feet supported;Single extremity supported Sitting balance-Leahy Scale: Poor Sitting balance - Comments: assistance required to maintain sitting balance EOB; pt with heavy R lateral bias   Standing balance support: Bilateral upper extremity supported;During functional activity Standing balance-Leahy Scale: Poor Standing balance comment: assist to maintain balance during transfers due to R lateral bias                           ADL either performed or assessed with clinical judgement   ADL Overall ADL's : Needs assistance/impaired                         Toilet Transfer: Maximal assistance;+2 for physical assistance(sit<>stand with use of stedy; simulated to recliner) Toilet Transfer Details (indicate cue type and reason): Max A +2 to power up into standing with useo f stedy. Requiring Max cues for safety and sequencing. Heavy lean to right and requiring physical A to maitnain upright posture         Functional mobility during ADLs: Maximal assistance;+2 for physical assistance(sit<>stand with stedy only) General ADL Comments: Pt presenting with poor safety, cognition, balance, and activity tolerance.      Vision  Perception     Praxis      Cognition Arousal/Alertness: Awake/alert Behavior During Therapy: WFL for tasks assessed/performed Overall Cognitive Status: No family/caregiver present to determine baseline cognitive functioning                                 General Comments: Dementia at baseline; perseverating on getting "lines" off. Poor problem solving and following of cues        Exercises     Shoulder  Instructions       General Comments VSS on RA    Pertinent Vitals/ Pain       Pain Assessment: Faces Faces Pain Scale: Hurts little more Pain Location: L UE Pain Descriptors / Indicators: Guarding;Grimacing;Sore Pain Intervention(s): Monitored during session;Limited activity within patient's tolerance;Repositioned  Home Living                                          Prior Functioning/Environment              Frequency  Min 2X/week        Progress Toward Goals  OT Goals(current goals can now be found in the care plan section)  Progress towards OT goals: Progressing toward goals  Acute Rehab OT Goals Patient Stated Goal: not stated OT Goal Formulation: With patient Time For Goal Achievement: 02/10/20 Potential to Achieve Goals: Good ADL Goals Pt Will Perform Eating: with modified independence;sitting Pt Will Perform Grooming: with supervision;sitting Pt Will Perform Upper Body Dressing: with mod assist;sitting  Plan Discharge plan remains appropriate    Co-evaluation    PT/OT/SLP Co-Evaluation/Treatment: Yes Reason for Co-Treatment: Complexity of the patient's impairments (multi-system involvement);To address functional/ADL transfers;For patient/therapist safety PT goals addressed during session: Mobility/safety with mobility;Balance OT goals addressed during session: ADL's and self-care      AM-PAC OT "6 Clicks" Daily Activity     Outcome Measure   Help from another person eating meals?: A Little Help from another person taking care of personal grooming?: A Lot Help from another person toileting, which includes using toliet, bedpan, or urinal?: A Lot Help from another person bathing (including washing, rinsing, drying)?: A Lot Help from another person to put on and taking off regular upper body clothing?: A Lot Help from another person to put on and taking off regular lower body clothing?: A Lot 6 Click Score: 13    End of Session  Equipment Utilized During Treatment: Other (comment)(stedy)  OT Visit Diagnosis: Unsteadiness on feet (R26.81);Muscle weakness (generalized) (M62.81);Repeated falls (R29.6);Pain Pain - Right/Left: Left Pain - part of body: Shoulder   Activity Tolerance Patient limited by fatigue;Other (comment)(Cognition)   Patient Left in chair;with call bell/phone within reach;with chair alarm set   Nurse Communication Mobility status;Need for lift equipment        Time: [id number] OT Time Calculation (min): 29 min  Charges: OT General Charges $OT Visit: 1 Visit OT Treatments $Self Care/Home Management : 8-22 mins  [place], OTR/L Acute Rehab Pager: [phone] Office: [place] 01/22/2020, 11:41 AM

## 2020-01-23 DIAGNOSIS — Z7189 Other specified counseling: Secondary | ICD-10-CM

## 2020-01-23 DIAGNOSIS — Z515 Encounter for palliative care: Secondary | ICD-10-CM

## 2020-01-23 NOTE — Consult Note (Signed)
Consultation Note Date: 01/23/2020   Patient Name: [name]  DOB: 1941-06-24  MRN: [id number]  Age / Sex: 79 y.o., male  PCP: Ernest Squibb, MD Referring Physician: Leonie Man, MD  Reason for Consultation: Establishing goals of care  HPI/Patient Profile: 79 y.o. male  with past medical history of CHF, ICM w/ EF 30-35%, severe multi-vessel CAD, HTN, HLD, GERD, ruptured MV chordae, Parkinson's dementia, and falls admitted on 01/16/2020 with chest pain. Found to have STEMI - medically managed as he is poor candidate for cath. Also poor candidate for anticoagulation d/t falls. Patient is medically stable for discharge and plan was for rehab; however, insurance did not approve. PMT consulted for [facility].   Clinical Assessment and Goals of Care: I have reviewed medical records including EPIC notes, labs and imaging, received report from Dr. Myrtie Long and RN, assessed the patient and then spoke with patient's wife, [name], to discuss diagnosis prognosis, [place], EOL wishes, disposition and options.  I introduced Palliative Medicine as specialized medical care for people living with serious illness. It focuses on providing relief from the symptoms and stress of a serious illness. The goal is to improve quality of life for both the patient and the family.  As far as functional and nutritional status, [name] tells me prior to admission patient was able to complete ADLs independently. Patient is confused at baseline.  [name] tells me she is also caring for a disabled son. She tells me that she walks with cane and walker herself and has her own health issues.   We reviewed notes from physical therapy - patient requiring max assist for all mobility.    We discussed his current illness and what it means in the larger context of his on-going co-morbidities.  Natural disease trajectory and expectations at EOL were discussed. We specifically discussed [name]  debility, his severe multivessel CAD, and limited treatment options.   I attempted to elicit values and goals of care important to the patient.  [name] tells me patient never spoke about his wishes if he were very ill - he avoided this topic and now he is unable to tell her his wishes.   The difference between aggressive medical intervention and comfort care was considered in light of the patient's goals of care. Discussed limitations of medical interventions.   Discussed disposition - [name] would very much like for patient to go to rehab as she fears she cannot care for him at home given his severe debility and her own physical limitations. She understands insurance did not approve. We also discussed that patient is a poor rehab candidate due to his severe disease.  We discussed options of hospice or home health at home. [name] is not opposed to hospice care - the idea of focusing on [name] comfort - however, she does not feel this will be enough support to safely care for [name] at home.   Questions and concerns were addressed. The family was encouraged to call with questions or concerns.   Primary Decision Maker NEXT OF KIN - wife - [name]    SUMMARY OF RECOMMENDATIONS    - disposition issue: denied SNF by insurance, wife unable to care for him at home even with extra support of resources, unable to hire extra caregivers - severity of patient's disease explained to wife and all questions addressed - if SNF worked out, suggest outpatient palliative referral at discharge  Code Status/Advance Care Planning:  DNR   Symptom Management:  Per primary  Prognosis:   Unable to determine  Discharge Planning: To Be Determined      Primary Diagnoses: Present on Admission: . Parkinson's disease ([place]) . Hypothyroidism . Acute on chronic combined systolic and diastolic HF (heart failure) ([place]) . Essential hypertension . Dementia due to Parkinson's disease with behavioral  disturbance ([place]) . Hyperlipidemia . CKD stage G3a/A2, GFR 45-59 and albumin creatinine ratio 30-299 mg/g . Coronary artery disease, occlusive   I have reviewed the medical record, interviewed the patient and family, and examined the patient. The following aspects are pertinent.  Past Medical History:  Diagnosis Date  . Anxiety   . CAD (coronary artery disease)    a. 01/2019: cath showing severe multivessel disease with 100% CTO of mid RCA, 50% proximal circumflex with 90% AV groove followed by 100% CTO of LPL (that recanalizes through left left lateral), 90 to 95% bifurcation LAD-2nd Diag followed by 60% and then tandem 99 to 90% lesions in the mid to distal LAD. Not candidate for CABG or PCI. Med management recommended.   . Cardiomyopathy ([place])   . Chronic combined systolic and diastolic CHF (congestive heart failure) ([place])   . Dementia ([place])   . GERD (gastroesophageal reflux disease)   . Hyperlipidemia   . Hypertension   . Incontinence   . 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  . Myocardial infarction ([place])   . Neuromuscular disorder ([place])   . Parkinson's disease ([place])   . Peripheral vascular disease ([place])    Social History   Socioeconomic History  . Marital status: Married    Spouse name: Not on file  . Number of children: Not on file  . Years of education: Not on file  . Highest education level: Not on file  Occupational History  . Not on file  Tobacco Use  . Smoking status: Former Smoker    Packs/day: 0.50    Years: 10.00    Pack years: 5.00    Types: Cigarettes    Quit date: 07/24/1983    Years since quitting: 36.5  . Smokeless tobacco: Never Used  Substance and Sexual Activity  . Alcohol use: No  . Drug use: No  . Sexual activity: Never  Other Topics Concern  . Not on file  Social History Narrative   Patient is married to his wife [name].  In the past, his POA has been his brother [name], however according to [name] is now starting  to have some the same memory issues and she is now therefore the main decision maker.      Via telephone conversation with [name], she indicates that Patricio has a yellow DO NOT RESUSCITATE form that was sent home with him when he left the nursing facility 5 months ago.  I asked her if she wanted the same wish to be respected here, and she said yes.  She was somewhat confused by the difference between CPR and intubation, but with further explanation, she agreed that she would not want either to be done.   Social Determinants of Health   Financial Resource Strain:   . Difficulty of Paying Living Expenses: Not on file  Food Insecurity:   . Worried About Charity fundraiser in the Last Year: Not on file  . Ran Out of Food in the Last Year: Not on file  Transportation Needs:   . Lack of Transportation (Medical): Not on file  . Lack of Transportation (Non-Medical): Not on file  Physical Activity:   . Days of Exercise per Week: Not on file  . Minutes of Exercise per Session: Not on file  Stress:   . Feeling of Stress : Not on file  Social Connections:   . Frequency of Communication with Friends and Family: Not on file  . Frequency of Social Gatherings with Friends and Family: Not on file  . Attends Religious Services: Not on file  . Active Member of Clubs or Organizations: Not on file  . Attends Archivist Meetings: Not on file  . Marital Status: Not on file   Family History  Problem Relation Age of Onset  . Cancer Mother   . Cancer Father   . Heart Problems Brother        has PPM  . Anesthesia problems Neg Hx   . Hypotension Neg Hx   . Malignant hyperthermia Neg Hx   . Pseudochol deficiency Neg Hx    Scheduled Meds: . acidophilus  1 capsule Oral BID  . amLODipine  10 mg Oral Daily  . aspirin EC  81 mg Oral Daily  . atorvastatin  40 mg Oral q1800  . carbidopa-levodopa  1 tablet Oral BID  . carvedilol  12.5 mg Oral BID WC  . clopidogrel  75 mg Oral Daily  . donepezil   10 mg Oral QHS  . DULoxetine  60 mg Oral QHS  . enoxaparin (LOVENOX) injection  40 mg Subcutaneous Q24H  . ferrous sulfate  325 mg Oral Q breakfast  . folic acid  1 mg Oral QHS  . furosemide  40 mg Oral BID  . hydrOXYzine  25 mg Oral TID  . isosorbide dinitrate  30 mg Oral TID  . levothyroxine  100 mcg Oral QAC breakfast  . multivitamin with minerals  1 tablet Oral q morning - 10a  . oxybutynin  5 mg Oral BID  . ranolazine  500 mg Oral BID  . rasagiline  1 mg Oral Daily  . simethicone  80 mg Oral BID  . sodium chloride flush  3 mL Intravenous Q12H   Continuous Infusions: . sodium chloride     PRN Meds:.sodium chloride, acetaminophen, ALPRAZolam, morphine injection, Muscle Rub, nitroGLYCERIN, ondansetron (ZOFRAN) IV, polyvinyl alcohol, sodium chloride flush, traMADol, triamcinolone ointment, zolpidem No Known Allergies Review of Systems  Unable to perform ROS: Dementia    Physical Exam Constitutional:      General: He is not in acute distress. HENT:     Head: Normocephalic and atraumatic.  Cardiovascular:     Rate and Rhythm: Normal rate.  Pulmonary:     Effort: Pulmonary effort is normal.     Comments: On 3L oxygen Skin:    General: Skin is warm and dry.  Neurological:     Mental Status: He is alert. He is disoriented.  Psychiatric:     Comments: Slight agitation     Vital Signs: BP 121/66   Pulse 73   Temp 98.5 F (36.9 C)   Resp 18   Ht 5\' 8"  (1.727 m)   Wt 86.3 kg   SpO2 96%   BMI 28.93 kg/m  Pain Scale: 0-10 POSS *See Group Information*: 1-Acceptable,Awake and alert Pain Score: 4    SpO2: SpO2: 96 % O2 Device:SpO2: 96 % O2 Flow Rate: .O2 Flow Rate (L/min): 3 L/min  IO: Intake/output summary:   Intake/Output Summary (Last 24 hours) at 01/23/2020 1557 Last data filed at 01/23/2020 1253 Gross per 24 hour  Intake 1202 ml  Output  1500 ml  Net -298 ml    LBM: Last BM Date: 01/22/20 Baseline Weight: Weight: 83 kg Most recent weight: Weight: 86.3 kg      Palliative Assessment/Data: PPS 40%    Time Total: 70 minutes Greater than 50%  of this time was spent counseling and coordinating care related to the above assessment and plan.  Juel Burrow, DNP, AGNP-C Palliative Medicine Team [phone] Pager: [phone]

## 2020-01-23 NOTE — Plan of Care (Signed)
  Problem: Education: Goal: Knowledge of General Education information will improve Description: Including pain rating scale, medication(s)/side effects and non-pharmacologic comfort measures Outcome: Progressing   Problem: Health Behavior/Discharge Planning: Goal: Ability to manage health-related needs will improve Outcome: Progressing   Problem: Safety: Goal: Ability to remain free from injury will improve Outcome: Progressing   

## 2020-01-23 NOTE — TOC Progression Note (Addendum)
Transition of Care [facility]) - Progression Note    Patient Details  Name: [name] MRN: [id number] Date of Birth: 05/12/1941  Transition of Care [facility]) CM/SW [place], [place] Phone Number: [phone] 01/23/2020, 1:15 PM  Clinical Narrative:     CSW notes [employer] has denied insurance auth for SNF as they report patient's case was escalated to med review where MD identified that SNF was a higher level of care for rehab  than what patient could participate in.   CSW attempted to reach wife to inform of denial, however no answer.   CSW notes palliative has reported potential hospice and in agreement that SNF rehab is not appropriate. CSW has requested MD put orders in for palliative consult for them to have [place] discussion with patient's wife to identify dc plan.   Pending palliative discussion outcomes at this time. Should family decide to decline hospice, TOC Team can arrange home health services.   Expected Discharge Plan: Skilled Nursing Facility Barriers to Discharge: Ship broker, SNF Pending bed offer  Expected Discharge Plan and Services Expected Discharge Plan: [place] Choice: [place] arrangements for the past 2 months: Single Family Home                                       Social Determinants of Health (SDOH) Interventions    Readmission Risk Interventions Readmission Risk Prevention Plan 07/24/2019  Transportation Screening Complete  Medication Review [employer]) Complete  HRI or [place] Not Complete  SW Recovery Care/Counseling Consult Complete  Palliative Care Screening Not Complete  Skilled Nursing Facility Complete  Some recent data might be hidden

## 2020-01-23 NOTE — Progress Notes (Addendum)
Progress Note  Patient Name: [name] Date of Encounter: 01/23/2020  Primary Cardiologist: Carlyle Dolly, MD  Patient Profile:   [name] is a 79 y.o. male with a hx of combined systolic and diastolic CHF (EF 99991111 on echo 02/2019), ICM, NSTEMI 01/2019 w/ med rx for multi-vessel CAD, HTN, HLD, GERD, ruptured MV chordae, Parkinson's, dementia, thoracic aortic aneurysm, who presented with substernal chest pain that radiated to his arm. ST elevation on inf and ant leads. Code STEMI called but canceled then. His EKG changes and elevated trop is now consistent with STEMI definition, however Trope elevation is not as high as expected with current extensive EKG changes. He has not been a candidate for intervention due to comorbidities and is getting medical management. Had some continuous chest pain in first 48 hours but Troponin trended down. 01/19/2020: Now done with 72 h of Heparin. He has had some shoulder pain that improved. (X ray negative for fx of dislocation). No more chest pain.  Subjective   Patient was seen and evaluated at bedside. No acute events overnight. Denies any chest pain. He asks me to take off the pulse oxymeter. No other acute complaints.  Inpatient Medications    Scheduled Meds:  acidophilus  1 capsule Oral BID   amLODipine  10 mg Oral Daily   aspirin EC  81 mg Oral Daily   atorvastatin  40 mg Oral q1800   carbidopa-levodopa  1 tablet Oral BID   carvedilol  12.5 mg Oral BID WC   clopidogrel  75 mg Oral Daily   donepezil  10 mg Oral QHS   DULoxetine  60 mg Oral QHS   enoxaparin (LOVENOX) injection  40 mg Subcutaneous Q24H   ferrous sulfate  325 mg Oral Q breakfast   folic acid  1 mg Oral QHS   furosemide  40 mg Oral BID   hydrOXYzine  25 mg Oral TID   isosorbide dinitrate  30 mg Oral TID   levothyroxine  100 mcg Oral QAC breakfast   multivitamin with minerals  1 tablet Oral q morning - 10a   oxybutynin  5 mg Oral BID   ranolazine  500 mg Oral BID   rasagiline  1 mg Oral Daily   simethicone  80 mg Oral BID   sodium chloride flush  3 mL Intravenous Q12H   Continuous Infusions:  sodium chloride     PRN Meds: sodium chloride, acetaminophen, ALPRAZolam, morphine injection, Muscle Rub, nitroGLYCERIN, ondansetron (ZOFRAN) IV, polyvinyl alcohol, sodium chloride flush, traMADol, triamcinolone ointment, zolpidem   Vital Signs    Vitals:   01/23/20 0422 01/23/20 0423 01/23/20 0424 01/23/20 0425  BP:      Pulse: 60 (!) 58 63 64  Resp:      Temp:      TempSrc:      SpO2: 95% 95% (!) 88% (!) 81%  Weight:      Height:        Intake/Output Summary (Last 24 hours) at 01/23/2020 0734 Last data filed at 01/23/2020 0425 Gross per 24 hour  Intake 730 ml  Output 900 ml  Net -170 ml   Last 3 Weights 01/23/2020 01/22/2020 01/21/2020  Weight (lbs) 190 lb 4.1 oz 189 lb 6 oz 193 lb 2 oz  Weight (kg) 86.3 kg 85.9 kg 87.6 kg      Telemetry    NSR, wome PVC - Personally Reviewed  ECG   Not done Personally Reviewed  Physical Exam   GEN: No acute  distress.   Neck: No JVD Cardiac: RRR, no murmurs, rubs, or gallops.  Respiratory: Clear to auscultation bilaterally. GI: Soft, nontender, non-distended  MS: Mild 1+  edema; No deformity. Neuro:  Nonfocal  Psych: Normal affect   Labs    High Sensitivity Troponin:   Recent Labs  Lab 01/16/20 1612 01/16/20 1843 01/16/20 2139 01/18/20 1045 01/21/20 1155  TROPONINIHS 1,229* 1,193* 1,199* 677* 336*      Chemistry Recent Labs  Lab 01/16/20 1412 01/16/20 2334 01/20/20 0414 01/21/20 0512 01/22/20 0400  NA 137   < > 134* 135 135  K 4.2   < > 3.9 4.1 4.4  CL 106   < > 99 99 98  CO2 22   < > 25 24 27   GLUCOSE 141*   < > 119* 101* 151*  BUN 33*   < > 37* 44* 56*  CREATININE 1.45*   < > 1.66* 1.71* 1.88*  CALCIUM 8.9   < > 8.7* 9.0 9.2  PROT 7.0  --   --   --   --   ALBUMIN 3.5  --   --   --   --   AST 28  --   --   --   --   ALT 12  --   --   --   --   ALKPHOS 79  --   --   --   --     BILITOT 0.5  --   --   --   --   GFRNONAA 46*   < > 39* 38* 34*  GFRAA 53*   < > 45* 44* 39*  ANIONGAP 9   < > 10 12 10    < > = values in this interval not displayed.     Hematology Recent Labs  Lab 01/18/20 0431 01/19/20 0434 01/20/20 0414  WBC 6.8 7.0 6.3  RBC 3.17* 3.32* 3.03*  HGB 10.4* 10.8* 10.0*  HCT 30.5* 32.1* 28.9*  MCV 96.2 96.7 95.4  MCH 32.8 32.5 33.0  MCHC 34.1 33.6 34.6  RDW 14.0 14.0 13.5  PLT 148* 158 159    BNPNo results for input(s): BNP, PROBNP in the last 168 hours.   DDimer No results for input(s): DDIMER in the last 168 hours.   Radiology    DG Shoulder 1V Left  Result Date: 01/21/2020 CLINICAL DATA:  Left arm pain, decreased strength EXAM: LEFT SHOULDER COMPARISON:  None. FINDINGS: There is no evidence of fracture or dislocation. There is no evidence of arthropathy or other focal bone abnormality. Soft tissues are unremarkable. IMPRESSION: No fracture or dislocation of the left shoulder. Joint spaces are preserved. Electronically Signed   By: Eddie Candle M.D.   On: 01/21/2020 12:14   DG Elbow 2 Views Left  Result Date: 01/21/2020 CLINICAL DATA:  Pain EXAM: LEFT ELBOW - 2 VIEW COMPARISON:  None. FINDINGS: There is no evidence of fracture, dislocation, or joint effusion. Mild elbow joint arthrosis. Soft tissues are unremarkable. IMPRESSION: No fracture or dislocation of the left elbow. Mild arthrosis. No elbow joint effusion. Electronically Signed   By: Eddie Candle M.D.   On: 01/21/2020 12:15   DG Wrist 2 Views Left  Result Date: 01/21/2020 CLINICAL DATA:  Pain EXAM: LEFT WRIST - 2 VIEW COMPARISON:  None. FINDINGS: No fracture or dislocation of the left wrist. Joint spaces are preserved. The carpus is normally aligned. Soft tissues are unremarkable. IMPRESSION: No fracture or dislocation of the left wrist. The carpus is normally  aligned. Electronically Signed   By: Eddie Candle M.D.   On: 01/21/2020 12:16   DG Chest Port 1 View  Result Date:  01/21/2020 CLINICAL DATA:  Heart failure EXAM: PORTABLE CHEST 1 VIEW COMPARISON:  01/16/2020 FINDINGS: Cardiomegaly. Both lungs are clear. Numerous benign, calcified pulmonary nodules. The visualized skeletal structures are unremarkable. IMPRESSION: Cardiomegaly without acute abnormality of the lungs in AP portable projection. Electronically Signed   By: Eddie Candle M.D.   On: 01/21/2020 15:06   DG Foot 2 Views Left  Result Date: 01/21/2020 CLINICAL DATA:  Left foot pain EXAM: LEFT FOOT - 2 VIEW COMPARISON:  None. FINDINGS: No fracture or dislocation of the left foot. Mild first metatarsophalangeal and midfoot arthrosis. Small plantar and Achilles calcaneal spurs. Mild ankle mortise arthrosis. Soft tissues are unremarkable. IMPRESSION: No fracture or dislocation of the left foot.  Mild arthrosis. Electronically Signed   By: Eddie Candle M.D.   On: 01/21/2020 12:17    Cardiac Studies   ECHO 01/17/2020: IMPRESSIONS    1. Left ventricular ejection fraction, by visual estimation, is 35 to  40%. The left ventricle has low normal function. Left ventricular septal  wall thickness was moderately increased. There is moderately increased  left ventricular hypertrophy.   2. The left ventricle demonstrates regional wall motion abnormalities.  Apical akinesis with midseptal hypokinesis. No significant change in  regional wall motion abnormalities compared to exam from 02/12/2019. Side by  side comparison of images performed.   3. Calcifed, mobile mass in the left ventricle. This has been previously  documented on echocardiograms serially, and is felt to represent a  ruptured chordae tendinae.   4. Global right ventricle has normal systolic function.The right  ventricular size is normal. No increase in right ventricular wall  thickness.   5. Left atrial size was normal.   6. Right atrial size was normal.   7. Mild mitral annular calcification.   8. The mitral valve is normal in structure. Mild mitral valve   regurgitation. No evidence of mitral stenosis.   9. The tricuspid valve is normal in structure. Tricuspid valve  regurgitation is not demonstrated.  10. The aortic valve was not well visualized. Aortic valve regurgitation  is mild. Mild to moderate aortic valve sclerosis/calcification without any  evidence of aortic stenosis.  11. The pulmonic valve was not well visualized. Pulmonic valve  regurgitation is trivial.  12. The inferior vena cava is normal in size with greater than 50%  respiratory variability, suggesting right atrial pressure of 3 mmHg.  13. There is borderline dilatation of the aortic root measuring 39 mm.    ECHO: 02/12/2019  1. The left ventricle has moderate-severely reduced systolic function,  with an ejection fraction of 30-35%. The cavity size was normal. There is  asymmetric septal hypertrophy.. Left ventricular diastolic Doppler  parameters are consistent with impaired  relaxation Elevated mean left atrial pressure.   2. The right ventricle has normal systolic function. The cavity was  normal. There is no increase in right ventricular wall thickness.   3. The aortic valve has an indeterminant number of cusps Moderate  thickening of the aortic valve Moderate calcification of the aortic valve.  Aortic valve regurgitation is mild to moderate by color flow Doppler. mild  stenosis of the aortic valve.  Moderate aortic annular calcification noted.   4. There is a mobile density within the MV apparatus that appears to be a  rupture chordae. Mild thickening of the mitral valve leaflet. Mild  calcification of the mitral valve leaflet. There is mild mitral annular  calcification present. No evidence of  mitral valve stenosis.   5. The tricuspid valve is normal in structure.   6. The aortic root is normal in size and structure.   7. There is moderate dilatation of the aortic root measuring 41 mm.   8. Moderate hypokinesis of the left ventricular inferior wall.   9.  Severe akinesis of the left ventricular anteroseptal wall.  10. Severe akinesis of the left ventricular apical segment.    CATH: 02/10/2019 RCA Prox RCA lesion is 50% stenosed. Prox RCA to Mid RCA lesion is 80% stenosed. Mid RCA lesion is 90% stenosed. Mid RCA to Dist RCA lesion is 100% stenosed. ----- Part of the PDA and PL system was filled via right to right and left-to-right collaterals. Cx-OM: Ost Cx to Prox Cx lesion is 45% stenosed with 90% stenosed side branch in LAV Groove. ------ 3rd LPL-1 lesion is 80% stenosed. 3rd LPL-2 lesion is 99% stenosed. Prox LAD to Mid LAD lesion is 90% stenosed with 55% stenosed side branch in Ost 2nd Diag. Mid-distal LAD to Dist LAD lesion is 95% stenosed. Dist LAD lesion is 90% stenosed. -- LV end diastolic pressure is moderately elevated. Consistent with acute on chronic diastolic heart failure   SUMMARY Severe multivessel disease: 100% CTO of mid RCA, 50% proximal circumflex with 90% AV groove followed by 100% CTO of LPL (that recanalizes through left left lateral), 90 to 95% bifurcation LAD-2nd Diag followed by 60% and then tandem 99 to 90% lesions in the mid to distal LAD. -- >  Minimal good PCI or CABG options Moderately elevated LVEDP consistent with acute diastolic heart failure   RECOMMENDATIONS The patient will be transferred to a telemetry bed.  I plan to restart IV heparin to run for 48 hours for MI.  (Will restart 8 hours post sheath removal) Continue aggressive medical management. I discussed the findings with the patient's power of attorney/brother and sister-in-law:  Options going forward would be medical management, CABG, random PCI.  After discussing with the brother: It is clear that PCI is not likely a good option as it would not be a good candidate for dual antiplatelet therapy with frequent falls, he is also was anemic.  CABG would not be something that they would be interested in offering based on his dementia, renal insufficiency  and Parkinson's.  Would not likely recover.   Best option going forward would be medical management.   With renal insufficiency, we will hydrate him, but he would likely require diuresis and standing diuretic during this hospitalization. 2D echo was ordered to better assess his EF, however I suspect it to be reduced.  t     Patient Profile     79 y.o. male  with a hx of combined systolic and diastolic CHF (EF 99991111 on echo 02/2019), ICM, NSTEMI 01/2019 w/ med rx for multi-vessel CAD, HTN, HLD, GERD, ruptured MV chordae, Parkinson's, dementia, thoracic aortic aneurysm, who presented with substernal chest pain that radiated to his arm. ST elevation on inf and ant leads. Code STEMI called but canceled then. His EKG changes and elevated trop is now consistent with STEMI definition, however Trope elevation is not as high as expected with current extensive EKG changes. He has not been a candidate for intervention due to comorbidities and is getting medical management. Had some continuous chest pain in first 48 hours but Troponin trended down. 01/19/2020: Now done with 72 h  of Heparin. He has had some shoulder pain that improved. (X ray negative for fx of dislocation). No more chest pain.  Assessment & Plan    STEMI:  Medically managed due to being a poor candidate to perform cath. (Patient with Hx of severe multivessel disease, parkinson disease, dementia, also poor candidate for DAPT given hx of frequent falls and increased chance of bleeding).  Symptoms improved. (Troponin levels trended down despite continuous chest pain initially. Completed 72 hours of heparin and chest pain remained controlled)  VS stable and medically ready to be discharged. Dispo plan has been pending. Social worker has been Office manager. Apparently it was rejected. Also does not seem to take long term benefit from rehab given all co morbidities not having a great long term prognosis. Palliative care consulted to  discuss goal of care with family again.   Continue current plan: -Continue ASA and plavix unintrupted for a month then stop ASA and keep intrupted plavix for 6 months. -Continue Coreg 12.5 mg BID -Continue Amlodipine 10 mg QD -Continue Ranexa 500 mg BID (started 2/4) -Off Nitro paste and resumed home Isordil (2/5).  Increase dose to 30 mg TID (2/7) given chest pain. Chest pain now resolved. -BMP daily when in hospital and repeat in a week after discharge -Dispo pending goal of care discussion with family. Appreciate palliative care team follow up. Anticipating DC home today with either hospice care of home health. -Appreciate social worker follow up for dispo plan as above   Acute on Chronic combined systolic and diastolic HF: Stable and diuresed well. Ischemic cardiomyopathy.  Echo 01/17/2020 EF 35-40%. Regional wall motion abnormality that is suggestive of LAD involvement, reported no change comparing to prior study 2020 Interestingly, despite new troponin elevations and EKG changes, EF appears to be relatively stable.  Lungs are relatively clear.  Output not recorded.  -His Cr increased to 1.8 yesterday. Can continue po diuretics for now but will monitor kidney function and repeat BMP after discharge to adjust diuretic as needed. -Continue PO Lasix 40 mg BID --> -BMP daily when in hospital and repeat in a week after discharge -Replace electrolyte as needed -Continue Coreg -No ACE inhibitor until BP allows and AKI resolves -Strict I/O -Weight daily -fluid restriction   Other chronic medical conditions: Hypothyroidism:  -Continue home Levothyroxine   Parkinson: Dementia: -Continue home meds: L-dopa Carbidopa, Donepezil, Razagiline, PRN Zolpidem at night for insomnia This clearly as another level of complexity to his care as he intermittently has spells of lucidity worried will feel chest pain, but otherwise is confused. Patient is stable and calm today   Urinary incontinency:    -Continue home oxybutynin   L arm and shoulder pain: Tenderness on shoulder. With Hx of falls at home out of bed and arm/foot pain since that time. Left upper extremity x-rays performed and were unremarkable. Pain improved. Can continue pain control for MSK pain.  For questions or updates, please contact [place] Please consult [email or web address] for contact info under        Signed, Dewayne Hatch, MD  01/23/2020, 7:34 AM    Agree with note by Dr Myrtie Hawk  Patient ready for discharge.  We were exploring skilled nursing facility options which do not appear to be available.  Palliative care is involved and will discuss with the family the possibility of hospice.  No changes in his medical condition.  Hopefully we can get him home today with medical care for his CAD and ischemic cardiomyopathy.  Roderic Palau  Adora Fridge, M.D., [place], [facility], [place] [address] [place], [place]  [address]  [phone] 01/23/2020 12:23 PM

## 2020-01-23 NOTE — TOC Progression Note (Signed)
Transition of Care [facility]) - Progression Note    Patient Details  Name: [name] MRN: [id number] Date of Birth: August 01, 1941  Transition of Care [facility]) CM/SW [place], [place] Phone Number: 01/23/2020, 4:02 PM  Clinical Narrative:    CSW met with patient at bedside and gave patient a copy of his insurance denial from [facility] [employer].  CSW explained that his spouse will also receive a copy by mail. TOC Team will continue to follow for disposition planning.   Expected Discharge Plan: Skilled Nursing Facility Barriers to Discharge: Ship broker, SNF Pending bed offer  Expected Discharge Plan and Services Expected Discharge Plan: [place] Choice: [place] arrangements for the past 2 months: Single Family Home                                       Social Determinants of Health (SDOH) Interventions    Readmission Risk Interventions Readmission Risk Prevention Plan 07/24/2019  Transportation Screening Complete  Medication Review Press photographer) Complete  HRI or [place] Not Complete  SW Recovery Care/Counseling Consult Complete  Palliative Care Screening Not Complete  Skilled Nursing Facility Complete  Some recent data might be hidden

## 2020-01-23 NOTE — Care Plan (Signed)
Patient identified as high risk for 12 month mortality based on palliative care predictive tool. He has baseline Parkinson's dementia with severe multivessel CAD not a candidate for aggressive intervention or advanced therapies. He is not a good candidate for anti-coagulation due to history of falls. He is deconditioned and has persistent shoulder pain that limits his activity tolerance. Imaging not conclusive for source of left shoulder pain but worse with activity- likely musculoskeletal but may also have referred cardiac pain.  It is unlikely that he will successfully rehab in the setting of activity limiting cardiac disease and baseline frailty. He has stated a wish to return home- in general given his dementia he would do better in familiar setting and with familiar caregivers and social support not available in many facilities at this time.  Please consider palliative care consultation if appropriate to discuss his goals of care- he may be a good candidate for a hospice referral - this would provide additional support for his wife and improve his chances of being able to remain at home for the time he has left.  Lane Hacker, DO Palliative Medicine [phone]

## 2020-01-23 NOTE — Progress Notes (Signed)
Palliative Medicine RN Note: Received a call from the resident following [name] notifying our team that insurance has denied SNF placement and asking when we will be able to see him. Our team has a provider assigned, and she will reach out as soon as possible to set up a meeting with the patient and/or family.  Marjie Skiff Verdella Laidlaw, RN, BSN, [facility] Palliative Medicine Team 01/23/2020 1:22 PM Office [phone]

## 2020-01-24 NOTE — Progress Notes (Signed)
Thank you for consult on [name]. Progress/therapy notes reviewed. Note that patient with poor activity tolerance with dementia, pain and cardiac issues limiting activity tolerance. SNF recommended v/s Hospice with family per records. Do not feel that patient will be able to tolerate 3 hrs/day of therapy at this time and concur with recommendations of SNF if family unable to provide care needed

## 2020-01-24 NOTE — Progress Notes (Addendum)
Progress Note  Patient Name: [name] Date of Encounter: 01/24/2020  Primary Cardiologist: Carlyle Dolly, MD  Patient Profile:   [name] is a 79 y.o. male with a hx of combined systolic and diastolic CHF (EF 99991111 on echo 02/2019), ICM, NSTEMI 01/2019 w/ med rx for multi-vessel CAD, HTN, HLD, GERD, ruptured MV chordae, Parkinson's, dementia, thoracic aortic aneurysm, who presented with substernal chest pain that radiated to his arm. ST elevation on inf and ant leads. Code STEMI called but canceled then. His EKG changes and elevated trop is now consistent with STEMI definition, however Trope elevation is not as high as expected with current extensive EKG changes. He has not been a candidate for intervention due to comorbidities and is getting medical management. Had some continuous chest pain in first 48 hours but Troponin trended down. 01/19/2020: Now done with 72 h of Heparin. He has had some shoulder pain that improved. (X ray negative for fx of dislocation). No more chest pain.  Subjective   Patient was seen and evaluated at bedside on morning rounds. No other acute complaints. Denies any chest pain. We have been waiting for dispo plan.  Inpatient Medications    Scheduled Meds:  acidophilus  1 capsule Oral BID   amLODipine  10 mg Oral Daily   aspirin EC  81 mg Oral Daily   atorvastatin  40 mg Oral q1800   carbidopa-levodopa  1 tablet Oral BID   carvedilol  12.5 mg Oral BID WC   clopidogrel  75 mg Oral Daily   donepezil  10 mg Oral QHS   DULoxetine  60 mg Oral QHS   enoxaparin (LOVENOX) injection  40 mg Subcutaneous Q24H   ferrous sulfate  325 mg Oral Q breakfast   folic acid  1 mg Oral QHS   furosemide  40 mg Oral BID   hydrOXYzine  25 mg Oral TID   isosorbide dinitrate  30 mg Oral TID   levothyroxine  100 mcg Oral QAC breakfast   multivitamin with minerals  1 tablet Oral q morning - 10a   oxybutynin  5 mg Oral BID   ranolazine  500 mg Oral BID   rasagiline  1  mg Oral Daily   simethicone  80 mg Oral BID   sodium chloride flush  3 mL Intravenous Q12H   Continuous Infusions:  sodium chloride     PRN Meds: sodium chloride, acetaminophen, ALPRAZolam, morphine injection, Muscle Rub, nitroGLYCERIN, ondansetron (ZOFRAN) IV, polyvinyl alcohol, sodium chloride flush, traMADol, triamcinolone ointment, zolpidem   Vital Signs    Vitals:   01/24/20 1000 01/24/20 1030 01/24/20 1139 01/24/20 1158  BP:    105/68  Pulse: 81 84  82  Resp:      Temp:   98 F (36.7 C)   TempSrc:   Oral   SpO2: 92% 95%    Weight:      Height:        Intake/Output Summary (Last 24 hours) at 01/24/2020 1343 Last data filed at 01/24/2020 1000 Gross per 24 hour  Intake 720 ml  Output 755 ml  Net -35 ml   Last 3 Weights 01/24/2020 01/23/2020 01/22/2020  Weight (lbs) 192 lb 0.3 oz 190 lb 4.1 oz 189 lb 6 oz  Weight (kg) 87.1 kg 86.3 kg 85.9 kg      Telemetry   missed beat reported on tele does not seem to be acurate. Likely pvc- Personally Reviewed  ECG   Not done  Physical Exam  GEN: No acute distress.   Neck: No JVD Cardiac: RRR, no murmurs, rubs, or gallops.  Respiratory: Clear to auscultation bilaterally. GI: Soft, nontender, non-distended  MS: Mild 1+  edema; No deformity. Neuro:  Nonfocal  Psych: Normal affect   Labs    High Sensitivity Troponin:   Recent Labs  Lab 01/16/20 1612 01/16/20 1843 01/16/20 2139 01/18/20 1045 01/21/20 1155  TROPONINIHS 1,229* 1,193* 1,199* 677* 336*      Chemistry Recent Labs  Lab 01/20/20 0414 01/21/20 0512 01/22/20 0400  NA 134* 135 135  K 3.9 4.1 4.4  CL 99 99 98  CO2 25 24 27   GLUCOSE 119* 101* 151*  BUN 37* 44* 56*  CREATININE 1.66* 1.71* 1.88*  CALCIUM 8.7* 9.0 9.2  GFRNONAA 39* 38* 34*  GFRAA 45* 44* 39*  ANIONGAP 10 12 10      Hematology Recent Labs  Lab 01/18/20 0431 01/19/20 0434 01/20/20 0414  WBC 6.8 7.0 6.3  RBC 3.17* 3.32* 3.03*  HGB 10.4* 10.8* 10.0*  HCT 30.5* 32.1* 28.9*  MCV  96.2 96.7 95.4  MCH 32.8 32.5 33.0  MCHC 34.1 33.6 34.6  RDW 14.0 14.0 13.5  PLT 148* 158 159    BNPNo results for input(s): BNP, PROBNP in the last 168 hours.   DDimer No results for input(s): DDIMER in the last 168 hours.   Radiology    No results found.  Cardiac Studies   ECHO 01/17/2020: IMPRESSIONS    1. Left ventricular ejection fraction, by visual estimation, is 35 to  40%. The left ventricle has low normal function. Left ventricular septal  wall thickness was moderately increased. There is moderately increased  left ventricular hypertrophy.   2. The left ventricle demonstrates regional wall motion abnormalities.  Apical akinesis with midseptal hypokinesis. No significant change in  regional wall motion abnormalities compared to exam from 02/12/2019. Side by  side comparison of images performed.   3. Calcifed, mobile mass in the left ventricle. This has been previously  documented on echocardiograms serially, and is felt to represent a  ruptured chordae tendinae.   4. Global right ventricle has normal systolic function.The right  ventricular size is normal. No increase in right ventricular wall  thickness.   5. Left atrial size was normal.   6. Right atrial size was normal.   7. Mild mitral annular calcification.   8. The mitral valve is normal in structure. Mild mitral valve  regurgitation. No evidence of mitral stenosis.   9. The tricuspid valve is normal in structure. Tricuspid valve  regurgitation is not demonstrated.  10. The aortic valve was not well visualized. Aortic valve regurgitation  is mild. Mild to moderate aortic valve sclerosis/calcification without any  evidence of aortic stenosis.  11. The pulmonic valve was not well visualized. Pulmonic valve  regurgitation is trivial.  12. The inferior vena cava is normal in size with greater than 50%  respiratory variability, suggesting right atrial pressure of 3 mmHg.  13. There is borderline dilatation of the  aortic root measuring 39 mm.    ECHO: 02/12/2019  1. The left ventricle has moderate-severely reduced systolic function,  with an ejection fraction of 30-35%. The cavity size was normal. There is  asymmetric septal hypertrophy.. Left ventricular diastolic Doppler  parameters are consistent with impaired  relaxation Elevated mean left atrial pressure.   2. The right ventricle has normal systolic function. The cavity was  normal. There is no increase in right ventricular wall thickness.   3. The  aortic valve has an indeterminant number of cusps Moderate  thickening of the aortic valve Moderate calcification of the aortic valve.  Aortic valve regurgitation is mild to moderate by color flow Doppler. mild  stenosis of the aortic valve.  Moderate aortic annular calcification noted.   4. There is a mobile density within the MV apparatus that appears to be a  rupture chordae. Mild thickening of the mitral valve leaflet. Mild  calcification of the mitral valve leaflet. There is mild mitral annular  calcification present. No evidence of  mitral valve stenosis.   5. The tricuspid valve is normal in structure.   6. The aortic root is normal in size and structure.   7. There is moderate dilatation of the aortic root measuring 41 mm.   8. Moderate hypokinesis of the left ventricular inferior wall.   9. Severe akinesis of the left ventricular anteroseptal wall.  10. Severe akinesis of the left ventricular apical segment.    CATH: 02/10/2019 RCA Prox RCA lesion is 50% stenosed. Prox RCA to Mid RCA lesion is 80% stenosed. Mid RCA lesion is 90% stenosed. Mid RCA to Dist RCA lesion is 100% stenosed. ----- Part of the PDA and PL system was filled via right to right and left-to-right collaterals. Cx-OM: Ost Cx to Prox Cx lesion is 45% stenosed with 90% stenosed side branch in LAV Groove. ------ 3rd LPL-1 lesion is 80% stenosed. 3rd LPL-2 lesion is 99% stenosed. Prox LAD to Mid LAD lesion is 90% stenosed  with 55% stenosed side branch in Ost 2nd Diag. Mid-distal LAD to Dist LAD lesion is 95% stenosed. Dist LAD lesion is 90% stenosed. -- LV end diastolic pressure is moderately elevated. Consistent with acute on chronic diastolic heart failure   SUMMARY Severe multivessel disease: 100% CTO of mid RCA, 50% proximal circumflex with 90% AV groove followed by 100% CTO of LPL (that recanalizes through left left lateral), 90 to 95% bifurcation LAD-2nd Diag followed by 60% and then tandem 99 to 90% lesions in the mid to distal LAD. -- >  Minimal good PCI or CABG options Moderately elevated LVEDP consistent with acute diastolic heart failure   RECOMMENDATIONS The patient will be transferred to a telemetry bed.  I plan to restart IV heparin to run for 48 hours for MI.  (Will restart 8 hours post sheath removal) Continue aggressive medical management. I discussed the findings with the patient's power of attorney/brother and sister-in-law:  Options going forward would be medical management, CABG, random PCI.  After discussing with the brother: It is clear that PCI is not likely a good option as it would not be a good candidate for dual antiplatelet therapy with frequent falls, he is also was anemic.  CABG would not be something that they would be interested in offering based on his dementia, renal insufficiency and Parkinson's.  Would not likely recover.   Best option going forward would be medical management.   With renal insufficiency, we will hydrate him, but he would likely require diuresis and standing diuretic during this hospitalization. 2D echo was ordered to better assess his EF, however I suspect it to be reduced.  t     Patient Profile     79 y.o. male  with a hx of combined systolic and diastolic CHF (EF 99991111 on echo 02/2019), ICM, NSTEMI 01/2019 w/ med rx for multi-vessel CAD, HTN, HLD, GERD, ruptured MV chordae, Parkinson's, dementia, thoracic aortic aneurysm, who presented with substernal  chest pain that radiated to his  arm. ST elevation on inf and ant leads. Code STEMI called but canceled then. His EKG changes and elevated trop is now consistent with STEMI definition, however Trope elevation is not as high as expected with current extensive EKG changes. He has not been a candidate for intervention due to comorbidities and is getting medical management. Had some continuous chest pain in first 48 hours but Troponin trended down. 01/19/2020: Now done with 72 h of Heparin. He has had some shoulder pain that improved. (X ray negative for fx of dislocation). No more chest pain.  Assessment & Plan    STEMI:  Medically managed due to being a poor candidate to perform cath. (Patient with Hx of severe multivessel disease, parkinson disease, dementia, also poor candidate for DAPT given hx of frequent falls and increased chance of bleeding).  Symptoms improved. (Troponin levels trended down despite continuous chest pain initially. Completed 72 hours of heparin and chest pain remained controlled)  VS stable and medically ready to be discharged. Dispo plan has been pending. Social worker has been Office manager. Apparently it was rejected. Also does not seem to take long term benefit from rehab given all co morbidities not having a great long term prognosis. Palliative care consulted to discuss goal of care with family   Continue current plan when in hospital:  -Continue ASA and plavix unintrupted for a month then stop ASA and keep intrupted plavix for 6 months. -Continue Coreg 12.5 mg BID -Continue Amlodipine 10 mg QD -Continue Ranexa 500 mg BID (started 2/4) -Off Nitro paste and resumed home Isordil (2/5).  Increase dose to 30 mg TID (2/7) given chest pain. Chest pain now resolved. -BMP in a week after discharge -Complicated dispo: PT recommended SNF. SNF denied by insurance. Inpatient rehab consulted and denied patient given he can not tolerate rehab. Palliative care consulted  and talked to the wife about considering Hospice, Wife agrees with hospice but is concern she can not provide enough care at home. -We are arranging a face to face meeting with social worker and case manager to discuss dispo barriers and plan.    Acute on Chronic combined systolic and diastolic HF: Stable and diuresed well. Euvolumic.  Ischemic cardiomyopathy.  Echo 01/17/2020 EF 35-40%. Regional wall motion abnormality that is suggestive of LAD involvement, reported no change comparing to prior study 2020 Interestingly, despite new troponin elevations and EKG changes, EF appears to be relatively stable.     -Continue PO Lasix 40 mg BID -Replace electrolyte as needed -Continue Coreg -No ACE inhibitor until BP allows and AKI resolves -Strict I/O -Weight daily -fluid restriction   Other chronic medical conditions: Hypothyroidism:  -Continue home Levothyroxine   Parkinson: Dementia: -Continue home meds: L-dopa Carbidopa, Donepezil, Razagiline, PRN Zolpidem at night for insomnia This clearly as another level of complexity to his care as he intermittently has spells of lucidity worried will feel chest pain, but otherwise is confused. Patient is stable and calm today   Urinary incontinency:  -Continue home oxybutynin   L arm and shoulder pain: Tenderness on shoulder. With Hx of falls at home out of bed and arm/foot pain since that time. Left upper extremity x-rays performed and were unremarkable. Pain improved. Can continue pain control for MSK pain.  For questions or updates, please contact [place] Please consult [email or web address] for contact info under        Signed, Dewayne Hatch, MD  01/24/2020, 1:43 PM    Agree with Note by Dr Myrtie Hawk  Mr.  Batson  had a STEMI treated medically because of comorbidities.  His EF is in the 30% range.  He is on appropriate medications.  He was on heparin for 72 hours which was discontinued.  He no longer has chest pain.  He is  compensated.  Disposition and placement has been an issue with social work and case management.  He was initially turned down down by SNF, and inpatient rehab.  Plan is for discharge home with hospice care if SNF appeal is unsuccessful.  PT/OT felt that he needed significant rehab probably best performed in a skilled nursing facility.  Lorretta Harp, M.D., [place], [facility], [place] [address] [place], [place]  [address]  [phone] 01/24/2020 4:12 PM

## 2020-01-24 NOTE — Progress Notes (Signed)
Daily Progress Note   Patient Name: [name]       Date: 01/24/2020 DOB: Nov 01, 1941  Age: 79 y.o. MRN#: [id number] Attending Physician: Ernest Man, MD Primary Care Physician: Ernest Squibb, MD Admit Date: 01/16/2020  Reason for Consultation/Follow-up: Establishing goals of care  Subjective: Patient sitting up in chair with lift. RNs at bedside. Remains confused but not apparent distress.   Length of Stay: 8  Current Medications: Scheduled Meds:  . acidophilus  1 capsule Oral BID  . amLODipine  10 mg Oral Daily  . aspirin EC  81 mg Oral Daily  . atorvastatin  40 mg Oral q1800  . carbidopa-levodopa  1 tablet Oral BID  . carvedilol  12.5 mg Oral BID WC  . clopidogrel  75 mg Oral Daily  . donepezil  10 mg Oral QHS  . DULoxetine  60 mg Oral QHS  . enoxaparin (LOVENOX) injection  40 mg Subcutaneous Q24H  . ferrous sulfate  325 mg Oral Q breakfast  . folic acid  1 mg Oral QHS  . furosemide  40 mg Oral BID  . hydrOXYzine  25 mg Oral TID  . isosorbide dinitrate  30 mg Oral TID  . levothyroxine  100 mcg Oral QAC breakfast  . multivitamin with minerals  1 tablet Oral q morning - 10a  . oxybutynin  5 mg Oral BID  . ranolazine  500 mg Oral BID  . rasagiline  1 mg Oral Daily  . simethicone  80 mg Oral BID  . sodium chloride flush  3 mL Intravenous Q12H    Continuous Infusions: . sodium chloride      PRN Meds: sodium chloride, acetaminophen, ALPRAZolam, morphine injection, Muscle Rub, nitroGLYCERIN, ondansetron (ZOFRAN) IV, polyvinyl alcohol, sodium chloride flush, traMADol, triamcinolone ointment, zolpidem  Physical Exam Constitutional:      General: He is not in acute distress. HENT:     Head: Normocephalic and atraumatic.  Cardiovascular:     Rate and Rhythm: Normal rate.    Pulmonary:     Effort: Pulmonary effort is normal.  Neurological:     Mental Status: He is alert. He is disoriented.             Vital Signs: BP 136/73 (BP Location: Left Arm)   Pulse 84   Temp 98 F (36.7 C) (Oral)   Resp 19   Ht 5\' 8"  (1.727 m)   Wt 87.1 kg   SpO2 95%   BMI 29.20 kg/m  SpO2: SpO2: 95 % O2 Device: O2 Device: Nasal Cannula O2 Flow Rate: O2 Flow Rate (L/min): 4 L/min  Intake/output summary:   Intake/Output Summary (Last 24 hours) at 01/24/2020 1157 Last data filed at 01/24/2020 1000 Gross per 24 hour  Intake 942 ml  Output 1355 ml  Net -413 ml   LBM: Last BM Date: 01/22/20 Baseline Weight: Weight: 83 kg Most recent weight: Weight: 87.1 kg       Palliative Assessment/Data: PPS 40%    Flowsheet Rows     Most Recent Value  Intake Tab  Referral Department  Cardiology  Unit at Time of Referral  Intermediate Care Unit  Palliative Care Primary Diagnosis  Cardiac  Date Notified  01/23/20  Palliative Care Type  New Palliative care  Reason for referral  Clarify Goals of Care  Date of Admission  01/16/20  Date first seen by Palliative Care  01/23/20  # of days Palliative referral response time  0 Day(s)  # of days IP prior to Palliative referral  7  Clinical Assessment  Palliative Performance Scale Score  40%  Psychosocial & Spiritual Assessment  Palliative Care Outcomes  Patient/Family meeting held?  Yes  Who was at the meeting?  wife  Palliative Care Outcomes  Clarified goals of care, Counseled regarding hospice, Provided psychosocial or spiritual support      Patient Active Problem List   Diagnosis Date Noted  . Goals of care, counseling/discussion   . Palliative care by specialist   . Coronary artery disease, occlusive 01/20/2020  . Unstable angina ([place]) 01/16/2020  . Acute ST elevation myocardial infarction (STEMI) of anterior wall ([place]) 01/16/2020  . ARF (acute renal failure) ([place]) 07/21/2019  . Nausea vomiting and diarrhea 07/21/2019   . Dehydration 07/21/2019  . Upper GI bleed 07/21/2019  . Cardiomyopathy EF 30-35%   . Parkinson's disease ([place])   . Hypothyroidism   . Elevated BUN   . Lobar pneumonia ([place]) 02/10/2019  . Acute on chronic respiratory failure with hypoxia ([place]) 02/10/2019  . NSTEMI (non-ST elevated myocardial infarction) ([place]) 02/10/2019  . Acute on chronic combined systolic and diastolic CHF (congestive heart failure) ([place]) 02/10/2019  . Non-STEMI (non-ST elevated myocardial infarction) ([place]) 02/10/2019  . Chest pain 02/09/2019  . Acute on chronic systolic CHF (congestive heart failure) ([place]) 02/09/2019  . Acute on chronic combined systolic and diastolic HF (heart failure) ([place])   . Dementia due to Parkinson's disease with behavioral disturbance ([place])   . Hyperlipidemia   . Essential hypertension   . CKD stage G3a/A2, GFR 45-59 and albumin creatinine ratio 30-299 mg/g   . Elevated troponin   . Hypokalemia   . SOB (shortness of breath) 06/08/2018  . Symptomatic stenosis of right carotid artery 10/07/2016  . Preoperative cardiovascular examination 09/02/2016  . Abnormal EKG 09/02/2016  . Murmur 09/02/2016  . Carotid artery stenosis with cerebral infarction [facility]) 09/02/2016    Palliative Care Assessment & Plan   HPI: 79 y.o. male  with past medical history of CHF, ICM w/ EF 30-35%, severe multi-vessel CAD, HTN, HLD, GERD, ruptured MV chordae, Parkinson's dementia, and falls admitted on 01/16/2020 with chest pain. Found to have STEMI - medically managed as he is poor candidate for cath. Also poor candidate for anticoagulation d/t falls. Patient is medically stable for discharge and plan was for rehab; however, insurance did not approve. PMT consulted for [place].   Assessment: Checked on patient - remains confused and unable to participate in goals of care; however, no apparent distress.   When I speak with [name] her immediate concern is patient's disposition. She is very worried about patient coming  home - does not think she is able to care for him at home even with extra support of hospice or home health. She tells me she has spoken with insurance company this morning and asked them to reconsider [name] for rehab - she tells me about [name] doing very well with rehab in the past and she would like to at least make an attempt for him to regain some function.   She has a good understanding of medical conditions - we briefly reviewed conversation yesterday about Mr. Werley conditions and high risk for acute decline. She  remains hopeful for him to regain some level of function that he had prior to hospitalization.    Questions and concerns were addressed. The family was encouraged to call with questions or concerns.   Recommendations/Plan:  Disposition issue: wife requesting SNF but insurance denied  Discussed medical condition, severity of illnesses, overall frailty and debility  If patient does not go home with hospice, suggest referral to palliative outpatient  Code Status:  DNR  Prognosis:   Unable to determine  Discharge Planning:  To Be Determined  Care plan was discussed with RN, patient's wife, CSW and CM  Thank you for allowing the Palliative Medicine Team to assist in the care of this patient.   Total Time 25 minutes Prolonged Time Billed  no       Greater than 50%  of this time was spent counseling and coordinating care related to the above assessment and plan.  Juel Burrow, DNP, [facility] Palliative Medicine Team Team Phone # [phone]  Pager [phone]

## 2020-01-24 NOTE — TOC Progression Note (Signed)
Transition of Care [facility]) - Progression Note    Patient Details  Name: [name] MRN: [id number] Date of Birth: December 12, 1941  Transition of Care [facility]) CM/SW [place], [place] Phone Number: [phone] 01/24/2020, 12:19 PM  Clinical Narrative:     CSW spoke with Dorian Pod with [facility], she reports able to meet family's needs at home. She will call wife, inform of wrap around services offered, and see if wife is accepting of this. She will let CSW know wife's decision on accepting home with [facility] hosp[facility].     Expected Discharge Plan and Services Expected Discharge Plan: home hospice     Post Acute Care Choice: [place] Living arrangements for the past 2 months: Single Family Home                                       Social Determinants of Health (SDOH) Interventions    Readmission Risk Interventions Readmission Risk Prevention Plan 07/24/2019  Transportation Screening Complete  Medication Review ([place]) Complete  HRI or [facility] Not Complete  SW Recovery Care/Counseling Consult Complete  Palliative Care Screening Not Complete  Skilled Nursing Facility Complete  Some recent data might be hidden

## 2020-01-24 NOTE — Progress Notes (Signed)
Inpatient Rehab Admissions:  Inpatient Rehab Consult received.  Please see note from PMR PA, Pam Love on today's date.  CIR will sign off at this time.    Signed: Shann Medal, PT, DPT Admissions Coordinator [phone] 01/24/20  11:49 AM

## 2020-01-24 NOTE — Progress Notes (Signed)
To the best of my knowledge, the student's charting is accurate.  

## 2020-01-24 NOTE — Discharge Summary (Addendum)
Discharge Summary    Patient ID: [name] MRN: [id number]; DOB: 1941/09/08  Admit date: 01/16/2020 Discharge date: 01/25/2020  Primary Care Provider: Celene Squibb, MD  Primary Cardiologist: Carlyle Dolly, MD  Primary Electrophysiologist:  None   Discharge Diagnoses    Principal Problem:   Acute ST elevation myocardial infarction (STEMI) of anterior wall [facility]) Active Problems:   Acute on chronic combined systolic and diastolic HF (heart failure) ([place])   Dementia due to Parkinson's disease with behavioral disturbance ([place])   Hyperlipidemia   Essential hypertension   CKD stage G3a/A2, GFR 45-59 and albumin creatinine ratio 30-299 mg/g   Cardiomyopathy EF 30-35%   Parkinson's disease ([place])   Hypothyroidism   Coronary artery disease, occlusive   Goals of care, counseling/discussion   Palliative care by specialist    Diagnostic Studies/Procedures    N/a    History of Present Illness     [name] is a 79 y.o. male with a hx of combined systolic and diastolic CHF (EF [id number] on echo 02/2019), ICM, NSTEMI 01/2019 w/ med rx for multi-vessel CAD, HTN, HLD, GERD, ruptured MV chordae, Parkinson's, dementia, thoracic aortic aneurysm, who presented with substernal chest pain that radiated to his arm. ST elevation on inf and ant leads and Troponin was elevated. He was admitted for STEMI. Seen by Dr. Ellyn Hack in the ED and decision was made to treat medically and admit for further management.    Hospital Course   Consultants: Palliative Care     1. STEMI:  hsTn peaked at 1229. He was treated with IV heparin for 72 hours. He was felt to be a poor candidate for any intervention due to multiple comorbidities: Parkinson, dementia. Echo noted EF as 35-40%. Plan for DAPT for one month, then stopping ASA. Started on Ranexa 500mg  BID.   2. Acute on Chronic combined systolic and diastolic HF:  Echo 0000000 EF 35-40%. Regional wall motion abnormality that is suggestive of LAD  involvement, reported no change comparing to prior study 2020. He was diuresed with IV lasix. Plan for discharge is continue home lasix 40mg  BID.  -- tolerated BB, no ACEi/ARB given Cr.  3. Hypothyroidism: Stable on home levothyroxin    4. Parkinson/Dementia: Patient continued to have spells of lucidity and confusion during this hospitalization. However no major agitation overly. His dementia has been one of the barriers for performing heart cath as the prior procedure last year complicated with patient agitation an non tolerance. He was continued on home medications without significant change.  5. Left arm and shoulder pain: He had some tenderness on shoulder. With Hx of falls at home. Left upper extremity x-rays performed and were unremarkable. Pain improved.   Dispo: Not a candidate for inpatient rehab per PM&R eval. Insurance denied SNF. Palliative care discussed goal of care with family and planned to receive hospice care at home after discharge. Seen by CM and SW with outpatient hospice arranged at discharge.   Did the patient have an acute coronary syndrome (MI, NSTEMI, STEMI, etc) this admission?:  Yes                               AHA/ACC Clinical Performance & Quality Measures: Aspirin prescribed? - Yes ADP Receptor Inhibitor (Plavix/Clopidogrel, Brilinta/Ticagrelor or Effient/Prasugrel) prescribed (includes medically managed patients)? - Yes Beta Blocker prescribed? - Yes High Intensity Statin (Lipitor 40-80mg  or Crestor 20-40mg ) prescribed? - Yes EF assessed during THIS hospitalization? - Yes For  EF <40%, was ACEI/ARB prescribed? - No - Reason:  Soft BP For EF <40%, Aldosterone Antagonist (Spironolactone or Eplerenone) prescribed? - Not Applicable (EF >/= [id number]) Cardiac Rehab Phase II ordered (Included Medically managed Patients)? - Yes   _____________  Discharge Vitals Blood pressure (!) 110/57, pulse 71, temperature 98.3 F (36.8 C), temperature source Oral, resp. rate 20,  height 5\' 8"  (1.727 m), weight 88.5 kg, SpO2 98 %.  Filed Weights   01/23/20 0402 01/24/20 0400 01/25/20 0400  Weight: 86.3 kg 87.1 kg 88.5 kg    Labs & Radiologic Studies    CBC Recent Labs    01/25/20 0447  WBC 5.7  HGB 10.3*  HCT 29.6*  MCV 94.9  PLT 123456   Basic Metabolic Panel Recent Labs    01/25/20 0447  NA 137  K 3.7  CL 94*  CO2 29  GLUCOSE 110*  BUN 50*  CREATININE 1.84*  CALCIUM 9.1  MG 2.3   Liver Function Tests No results for input(s): AST, ALT, ALKPHOS, BILITOT, PROT, ALBUMIN in the last 72 hours. No results for input(s): LIPASE, AMYLASE in the last 72 hours. High Sensitivity Troponin:   Recent Labs  Lab 01/16/20 1612 01/16/20 1843 01/16/20 2139 01/18/20 1045 01/21/20 1155  TROPONINIHS 1,229* 1,193* 1,199* 677* 336*    BNP Invalid input(s): POCBNP D-Dimer No results for input(s): DDIMER in the last 72 hours. Hemoglobin A1C No results for input(s): HGBA1C in the last 72 hours. Fasting Lipid Panel No results for input(s): CHOL, HDL, LDLCALC, TRIG, CHOLHDL, LDLDIRECT in the last 72 hours. Thyroid Function Tests No results for input(s): TSH, T4TOTAL, T3FREE, THYROIDAB in the last 72 hours.  Invalid input(s): FREET3 _____________  DG Shoulder 1V Left  Result Date: 01/21/2020 CLINICAL DATA:  Left arm pain, decreased strength EXAM: LEFT SHOULDER COMPARISON:  None. FINDINGS: There is no evidence of fracture or dislocation. There is no evidence of arthropathy or other focal bone abnormality. Soft tissues are unremarkable. IMPRESSION: No fracture or dislocation of the left shoulder. Joint spaces are preserved. Electronically Signed   By: Eddie Candle M.D.   On: 01/21/2020 12:14   DG Elbow 2 Views Left  Result Date: 01/21/2020 CLINICAL DATA:  Pain EXAM: LEFT ELBOW - 2 VIEW COMPARISON:  None. FINDINGS: There is no evidence of fracture, dislocation, or joint effusion. Mild elbow joint arthrosis. Soft tissues are unremarkable. IMPRESSION: No fracture or  dislocation of the left elbow. Mild arthrosis. No elbow joint effusion. Electronically Signed   By: Eddie Candle M.D.   On: 01/21/2020 12:15   DG Wrist 2 Views Left  Result Date: 01/21/2020 CLINICAL DATA:  Pain EXAM: LEFT WRIST - 2 VIEW COMPARISON:  None. FINDINGS: No fracture or dislocation of the left wrist. Joint spaces are preserved. The carpus is normally aligned. Soft tissues are unremarkable. IMPRESSION: No fracture or dislocation of the left wrist. The carpus is normally aligned. Electronically Signed   By: Eddie Candle M.D.   On: 01/21/2020 12:16   DG Chest Port 1 View  Result Date: 01/21/2020 CLINICAL DATA:  Heart failure EXAM: PORTABLE CHEST 1 VIEW COMPARISON:  01/16/2020 FINDINGS: Cardiomegaly. Both lungs are clear. Numerous benign, calcified pulmonary nodules. The visualized skeletal structures are unremarkable. IMPRESSION: Cardiomegaly without acute abnormality of the lungs in AP portable projection. Electronically Signed   By: Eddie Candle M.D.   On: 01/21/2020 15:06   DG Chest Port 1 View  Result Date: 01/16/2020 CLINICAL DATA:  Onset chest pain radiating into the left arm  today. EXAM: PORTABLE CHEST 1 VIEW COMPARISON:  Single-view of the chest 07/21/2019. CT chest 02/09/2019. FINDINGS: Scattered bilateral punctate granulomata are again. Heart size is normal seen. No pneumothorax. Lungs are otherwise clear or pleural effusion. No acute or focal bony abnormality. IMPRESSION: No acute disease. Electronically Signed   By: Inge Rise M.D.   On: 01/16/2020 14:53   DG Foot 2 Views Left  Result Date: 01/21/2020 CLINICAL DATA:  Left foot pain EXAM: LEFT FOOT - 2 VIEW COMPARISON:  None. FINDINGS: No fracture or dislocation of the left foot. Mild first metatarsophalangeal and midfoot arthrosis. Small plantar and Achilles calcaneal spurs. Mild ankle mortise arthrosis. Soft tissues are unremarkable. IMPRESSION: No fracture or dislocation of the left foot.  Mild arthrosis. Electronically Signed    By: Eddie Candle M.D.   On: 01/21/2020 12:17   ECHOCARDIOGRAM COMPLETE  Result Date: 01/17/2020   ECHOCARDIOGRAM REPORT   Patient Name:   TAOS LANNEN Date of Exam: 01/17/2020 Medical Rec #:  [id number]     Height:       68.0 in Accession #:    [id number]    Weight:       194.7 lb Date of Birth:  1941/03/27    BSA:          2.02 m Patient Age:    70 years      BP:           121/67 mmHg Patient Gender: M             HR:           78 bpm. Exam Location:  Inpatient Procedure: 2D Echo, Intracardiac Opacification Agent, Cardiac Doppler and Color            Doppler Indications:    R07.9* Chest pain, unspecified  History:        Patient has prior history of Echocardiogram examinations, most                 recent 02/12/2019. CHF and Cardiomyopathy, CAD and Previous                 Myocardial Infarction, Mitral Valve Disease; Risk                 Factors:Dyslipidemia. Parkinson's Disease. GERD.  Sonographer:    Jonelle Sidle Dance Referring Phys: [address] [place]  1. Left ventricular ejection fraction, by visual estimation, is 35 to 40%. The left ventricle has low normal function. Left ventricular septal wall thickness was moderately increased. There is moderately increased left ventricular hypertrophy.  2. The left ventricle demonstrates regional wall motion abnormalities. Apical akinesis with midseptal hypokinesis. No significant change in regional wall motion abnormalities compared to exam from 02/12/2019. Side by side comparison of images performed.  3. Calcifed, mobile mass in the left ventricle. This has been previously documented on echocardiograms serially, and is felt to represent a ruptured chordae tendinae.  4. Global right ventricle has normal systolic function.The right ventricular size is normal. No increase in right ventricular wall thickness.  5. Left atrial size was normal.  6. Right atrial size was normal.  7. Mild mitral annular calcification.  8. The mitral valve is normal in structure. Mild  mitral valve regurgitation. No evidence of mitral stenosis.  9. The tricuspid valve is normal in structure. Tricuspid valve regurgitation is not demonstrated. 10. The aortic valve was not well visualized. Aortic valve regurgitation is mild. Mild to moderate aortic valve sclerosis/calcification without any evidence  of aortic stenosis. 11. The pulmonic valve was not well visualized. Pulmonic valve regurgitation is trivial. 12. The inferior vena cava is normal in size with greater than 50% respiratory variability, suggesting right atrial pressure of 3 mmHg. 13. There is borderline dilatation of the aortic root measuring 39 mm. FINDINGS  Left Ventricle: Left ventricular ejection fraction, by visual estimation, is 35 to 40%. The left ventricle has low normal function. Definity contrast agent was given IV to delineate the left ventricular endocardial borders. The left ventricle demonstrates regional wall motion abnormalities. There is moderately increased left ventricular hypertrophy. Asymmetric left ventricular hypertrophy. Left ventricular diastolic parameters are indeterminate. Right Ventricle: The right ventricular size is normal. No increase in right ventricular wall thickness. Global RV systolic function is has normal systolic function. Left Atrium: Left atrial size was normal in size. Right Atrium: Right atrial size was normal in size Pericardium: There is no evidence of pericardial effusion. Mitral Valve: The mitral valve is normal in structure. Mild mitral annular calcification. Mild mitral valve regurgitation. No evidence of mitral valve stenosis by observation. Calcifed, mobile mass in the left ventricle. This has been previously documented on echocardiograms serially, and is felt to represent a ruptured chordae tendinae. Tricuspid Valve: The tricuspid valve is normal in structure. Tricuspid valve regurgitation is not demonstrated. Aortic Valve: The aortic valve was not well visualized. . There is moderate  thickening and moderate calcification of the aortic valve. Aortic valve regurgitation is mild. Aortic regurgitation PHT measures 542 msec. Mild to moderate aortic valve sclerosis/calcification is present, without any evidence of aortic stenosis. There is moderate thickening of the aortic valve. There is moderate calcification of the aortic valve. Pulmonic Valve: The pulmonic valve was not well visualized. Pulmonic valve regurgitation is trivial. Pulmonic regurgitation is trivial. Aorta: The aortic root, ascending aorta and aortic arch are all structurally normal, with no evidence of dilitation or obstruction. There is borderline dilatation of the aortic root measuring 39 mm. Venous: The inferior vena cava is normal in size with greater than 50% respiratory variability, suggesting right atrial pressure of 3 mmHg. IAS/Shunts: The interatrial septum was not well visualized.  LEFT VENTRICLE PLAX 2D LVIDd:         4.03 cm  Diastology LVIDs:         3.12 cm  LV e' lateral:   9.14 cm/s LV PW:         1.11 cm  LV E/e' lateral: 9.1 LV IVS:        1.66 cm  LV e' medial:    4.90 cm/s LVOT diam:     1.70 cm  LV E/e' medial:  17.1 LV SV:         33 ml LV SV Index:   15.73 LVOT Area:     2.27 cm  RIGHT VENTRICLE            IVC RV Basal diam:  2.26 cm    IVC diam: 1.91 cm RV S prime:     8.59 cm/s TAPSE (M-mode): 1.5 cm LEFT ATRIUM             Index       RIGHT ATRIUM           Index LA diam:        3.60 cm 1.78 cm/m  RA Area:     15.00 cm LA Vol (A2C):   53.6 ml 26.53 ml/m RA Volume:   33.40 ml  16.53 ml/m LA Vol (A4C):  70.3 ml 34.80 ml/m LA Biplane Vol: 62.2 ml 30.79 ml/m  AORTIC VALVE LVOT Vmax:   113.90 cm/s LVOT Vmean:  74.600 cm/s LVOT VTI:    0.220 m AI PHT:      542 msec  AORTA Ao Root diam: 3.90 cm Ao Asc diam:  3.70 cm MITRAL VALVE MV Area (PHT): 2.73 cm              SHUNTS MV PHT:        80.62 msec            Systemic VTI:  0.22 m MV Decel Time: 278 msec              Systemic Diam: 1.70 cm MV E velocity: 83.60  cm/s  103 cm/s MV A velocity: 150.00 cm/s 70.3 cm/s MV E/A ratio:  0.56        1.5  Cherlynn Kaiser MD Electronically signed by Cherlynn Kaiser MD Signature Date/Time: 01/17/2020/8:25:24 PM    Final    Disposition   Pt is being discharged home today in good condition.  Follow-up Plans & Appointments   Patient is discharged with outpatient referral to hospice.  Follow-up Information     Celene Squibb, MD. Go on 02/05/2020.   Specialty: Internal Medicine Why: @2 :40pm Contact information: [place] [facility] [address] [phone]         Arnoldo Lenis, MD Follow up on 02/06/2020.   Specialty: Cardiology Why: at 11:30am for your follow up appt Contact information: [address] [place] [address] [phone]           Discharge Instructions     Diet - low sodium heart healthy   Complete by: As directed    Increase activity slowly   Complete by: As directed        Discharge Medications   Allergies as of 01/25/2020   No Known Allergies      Medication List     TAKE these medications    acetaminophen 325 MG tablet Commonly known as: TYLENOL Take 2 tablets (650 mg total) by mouth every 6 (six) hours as needed for mild pain (or Fever >/= 101).   amLODipine 10 MG tablet Commonly known as: NORVASC Take 1 tablet (10 mg total) by mouth daily. What changed:  medication strength how much to take   aspirin EC 81 MG tablet Take 81 mg by mouth daily.   atorvastatin 40 MG tablet Commonly known as: LIPITOR Take 1 tablet (40 mg total) by mouth daily at 6 PM.   carbidopa-levodopa 25-100 MG tablet Commonly known as: SINEMET IR Take 1 tablet by mouth 2 (two) times daily.   carvedilol 12.5 MG tablet Commonly known as: COREG Take 1 tablet (12.5 mg total) by mouth 2 (two) times daily with a meal.   clopidogrel 75 MG tablet Commonly known as: PLAVIX Take 1 tablet (75 mg total) by mouth daily.   donepezil 10 MG tablet Commonly known as:  ARICEPT Take 10 mg by mouth at bedtime.   DULoxetine 60 MG capsule Commonly known as: CYMBALTA Take 60 mg by mouth at bedtime.   ferrous sulfate 325 (65 FE) MG tablet Take 1 tablet (325 mg total) by mouth daily with breakfast.   folic acid 1 MG tablet Commonly known as: FOLVITE Take 1 mg by mouth at bedtime.   furosemide 40 MG tablet Commonly known as: LASIX Take 1 tablet (40 mg total) by mouth 2 (two) times daily. What  changed:  medication strength how much to take when to take this reasons to take this   hydrOXYzine 25 MG capsule Commonly known as: VISTARIL Take 25 mg by mouth 3 (three) times daily.   isosorbide dinitrate 20 MG tablet Commonly known as: ISORDIL Take 20 mg by mouth 3 (three) times daily.   levothyroxine 100 MCG tablet Commonly known as: SYNTHROID Take 100 mcg by mouth daily before breakfast.   multivitamin with minerals tablet Take 1 tablet by mouth every morning.   nitroGLYCERIN 0.4 MG SL tablet Commonly known as: NITROSTAT Place 1 tablet (0.4 mg total) under the tongue every 5 (five) minutes as needed for chest pain.   oxybutynin 5 MG tablet Commonly known as: DITROPAN Take 5 mg by mouth 2 (two) times daily.   Probiotic-10 Ultimate Caps Take 1 capsule by mouth 2 (two) times daily.   ranolazine 500 MG 12 hr tablet Commonly known as: RANEXA Take 1 tablet (500 mg total) by mouth 2 (two) times daily.   rasagiline 1 MG Tabs tablet Commonly known as: AZILECT Take 1 mg by mouth daily.   simethicone 125 MG chewable tablet Commonly known as: MYLICON Chew 0000000 mg by mouth 2 (two) times daily.   triamcinolone ointment 0.1 % Commonly known as: KENALOG Apply 1 application topically 2 (two) times daily. Compound 1:1 with Eucerin.           Outstanding Labs/Studies    Duration of Discharge Encounter   Greater than 30 minutes including physician time.  Signed, Reino Bellis NP 01/25/2020, 2:54 PM  Agree with note by Reino Bellis  NP-C  Patient admitted with STEMI although not taken to the Cath Lab because of comorbidities.  His enzymes were relatively low.  He does have moderate LV dysfunction.  In addition, he has Parkinson's disease and dementia.  We are waiting for a SNF but he did not appear to be qualified for this nor was he qualified for inpatient rehab.  Ultimately was decided to send him home.  He will follow up with Dr. Harl Bowie as an outpatient.  Lorretta Harp, M.D., [place], [facility], [place] [address] [place], [place]  [address]  [phone] 01/26/2020 10:44 AM

## 2020-01-24 NOTE — Progress Notes (Signed)
Palliative Medicine RN Note: Our office rec'd a call from [name], who is concerned about [name] discharge plan. She reports that she got a letter last night that he has been refused for [name] and that she needs to speak to our NP [name], who is not in the office yet. [name] is concerned about being able to physically care for [name]. I sent a message to SW [name] to update her on the phone call, and I will ask [name] to call [name] once she comes in.  Ernest Skiff Glen Blatchley, RN, BSN, [facility] Palliative Medicine Team 01/24/2020 8:21 AM Office [phone]

## 2020-01-25 LAB — BASIC METABOLIC PANEL
Anion gap: 14 (ref 5–15)
BUN: 50 mg/dL — ABNORMAL HIGH (ref 8–23)
CO2: 29 mmol/L (ref 22–32)
Calcium: 9.1 mg/dL (ref 8.9–10.3)
Chloride: 94 mmol/L — ABNORMAL LOW (ref 98–111)
Creatinine, Ser: 1.84 mg/dL — ABNORMAL HIGH (ref 0.61–1.24)
GFR calc Af Amer: 40 mL/min — ABNORMAL LOW (ref >60)
GFR calc non Af Amer: 35 mL/min — ABNORMAL LOW (ref >60)
Glucose, Bld: 110 mg/dL — ABNORMAL HIGH (ref 70–99)
Potassium: 3.7 mmol/L (ref 3.5–5.1)
Sodium: 137 mmol/L (ref 135–145)

## 2020-01-25 LAB — CBC
HCT: 29.6 % — ABNORMAL LOW (ref 39.0–52.0)
Hemoglobin: 10.3 g/dL — ABNORMAL LOW (ref 13.0–17.0)
MCH: 33 pg (ref 26.0–34.0)
MCHC: 34.8 g/dL (ref 30.0–36.0)
MCV: 94.9 fL (ref 80.0–100.0)
Platelets: 213 10*3/uL (ref 150–400)
RBC: 3.12 MIL/uL — ABNORMAL LOW (ref 4.22–5.81)
RDW: 13 % (ref 11.5–15.5)
WBC: 5.7 10*3/uL (ref 4.0–10.5)
nRBC: 0 % (ref 0.0–0.2)

## 2020-01-25 LAB — MAGNESIUM: Magnesium: 2.3 mg/dL (ref 1.7–2.4)

## 2020-01-25 MED ORDER — RANOLAZINE ER 500 MG PO TB12: 500.0000 mg | ORAL_TABLET | Freq: Two times a day (BID) | ORAL | 1 refills | Status: AC

## 2020-01-25 MED ORDER — FUROSEMIDE 40 MG PO TABS: 40.0000 mg | ORAL_TABLET | Freq: Two times a day (BID) | ORAL | 60 refills | Status: AC

## 2020-01-25 MED ORDER — CLOPIDOGREL BISULFATE 75 MG PO TABS: 75.0000 mg | ORAL_TABLET | Freq: Every day | ORAL | 0 refills | Status: AC

## 2020-01-25 MED ORDER — AMLODIPINE BESYLATE 10 MG PO TABS: 10.0000 mg | ORAL_TABLET | Freq: Every day | ORAL | 0 refills | Status: AC

## 2020-01-25 NOTE — Progress Notes (Signed)
PT Cancellation Note  Patient Details Name: [name] MRN: [id number] DOB: 01/18/1941   Cancelled Treatment:    Reason Eval/Treat Not Completed: Patient declined, no reason specified Attempted to see pt for PT treatment. Pt resisting all attempts to mobilize. PT will continue to follow acutely.   Earney Navy, PTA Acute Rehabilitation Services Pager: [phone] Office: [phone]   01/25/2020, 10:55 AM

## 2020-01-25 NOTE — TOC Progression Note (Signed)
Transition of Care [facility]) - Progression Note    Patient Details  Name: [name] MRN: [id number] Date of Birth: [birth year]  [facility]) CM/SW Contact  Zenon Mayo, RN Phone Number: 01/25/2020, 9:48 AM  Clinical Narrative:    Patient wife sent in appeal for snf to her insurance co.  ,  NCM checked with [facility] [employer] to see if they had a decision, spoke with Crystal, she states they can not make a decision until wife send in the requested paperwork.  NCM contacted wife informed her of this information, she states they need paperwork that she can speak on patient's behalf to be faxed in to them.  She states the PCP  Has a copy and she will get PCP to fax to [facility] [facility].   Expected Discharge Plan: Skilled Nursing Facility Barriers to Discharge: Ship broker, SNF Pending bed offer  Expected Discharge Plan and Services Expected Discharge Plan: [place] Choice: [place] arrangements for the past 2 months: Single Family Home                                       Social Determinants of Health (SDOH) Interventions    Readmission Risk Interventions Readmission Risk Prevention Plan 07/24/2019  Transportation Screening Complete  Medication Review [employer]) Complete  HRI or [place] Not Complete  SW Recovery Care/Counseling Consult Complete  Palliative Care Screening Not Complete  Skilled Nursing Facility Complete  Some recent data might be hidden

## 2020-01-25 NOTE — TOC Transition Note (Signed)
Transition of Care [facility]) - CM/SW Discharge Note   Patient Details  Name: Mowgli Whetham MRN: [id number] Date of Birth: May 22, 1941  Transition of Care [facility]) CM/SW Contact:  Alberteen Sam, [place] Phone Number: [phone] 01/25/2020, 3:08 PM   Clinical Narrative:     Patient to discharge home today with [facility] hospice. Dorian Pod with [facility] has been notified of patient's discharge today, they are prepared to initiate hospice services today. Spouse Louise in agreement with dc plan and report patient's brother will pick up after 4pm today.   No further dc needs identified at this time.   Final next level of care: Home w Hospice Care Barriers to Discharge: No Barriers Identified   Patient Goals and CMS Choice   CMS Medicare.gov Compare Post Acute Care list provided to:: Patient Represenative (must comment)(spouse Louise) Choice offered to / list presented to : Spouse  Discharge Placement                    Patient and family notified of of transfer: 01/25/20  Discharge Plan and Services     Post Acute Care Choice: [place]                               Social Determinants of Health (SDOH) Interventions     Readmission Risk Interventions Readmission Risk Prevention Plan 07/24/2019  Transportation Screening Complete  Medication Review [employer]) Complete  HRI or [place] Not Complete  SW Recovery Care/Counseling Consult Complete  Palliative Care Screening Not Complete  Skilled Nursing Facility Complete  Some recent data might be hidden

## 2020-01-25 NOTE — Progress Notes (Addendum)
Progress Note  Patient Name: [name] Date of Encounter: 01/25/2020  Primary Cardiologist: Carlyle Dolly, MD   Patient profile  79 y.o. male with a hx ofcombined systolic and diastolicCHF(EF [id number]), ICM,NSTEMI 01/2019 w/ med rx for multi-vessel CAD, HTN, HLD, GERD, ruptured MV chordae, Parkinson's, dementia, thoracic aortic aneurysm,whopresented with substernal chest pain that radiated to his arm. ST elevation on inf and ant leads. Code STEMI called but canceled then. His EKG changes and elevated trop is now consistent with STEMI definition, however Trope elevation is not as high as expected with current extensive EKG changes. He has not been a candidate for intervention due to comorbidities and is getting medical management. Had some continuous chest pain in first 48 hours but Troponin trended down. 01/19/2020: Now done with 72 h of Heparin.He has had some shoulder pain that improved. (X ray negative for fx of dislocation). No more chest pain.  Subjective   Patient was seen and evaluated at bedside on morning rounds. No acute events overnight. No complaints. He is willing to go home. Denies chest pain, shortness of breath. No acute event overnight.  Inpatient Medications    Scheduled Meds: . acidophilus  1 capsule Oral BID  . amLODipine  10 mg Oral Daily  . aspirin EC  81 mg Oral Daily  . atorvastatin  40 mg Oral q1800  . carbidopa-levodopa  1 tablet Oral BID  . carvedilol  12.5 mg Oral BID WC  . clopidogrel  75 mg Oral Daily  . donepezil  10 mg Oral QHS  . DULoxetine  60 mg Oral QHS  . enoxaparin (LOVENOX) injection  40 mg Subcutaneous Q24H  . ferrous sulfate  325 mg Oral Q breakfast  . folic acid  1 mg Oral QHS  . furosemide  40 mg Oral BID  . hydrOXYzine  25 mg Oral TID  . isosorbide dinitrate  30 mg Oral TID  . levothyroxine  100 mcg Oral QAC breakfast  . multivitamin with minerals  1 tablet Oral q morning - 10a  . oxybutynin  5 mg Oral BID  .  ranolazine  500 mg Oral BID  . rasagiline  1 mg Oral Daily  . simethicone  80 mg Oral BID  . sodium chloride flush  3 mL Intravenous Q12H   Continuous Infusions: . sodium chloride     PRN Meds: sodium chloride, acetaminophen, ALPRAZolam, morphine injection, Muscle Rub, nitroGLYCERIN, ondansetron (ZOFRAN) IV, polyvinyl alcohol, sodium chloride flush, traMADol, triamcinolone ointment, zolpidem   Vital Signs    Vitals:   01/24/20 1600 01/24/20 1926 01/25/20 0000 01/25/20 0400  BP: 114/71 123/68 113/62 122/66  Pulse: 73 72 71 66  Resp:  18  20  Temp: 97.9 F (36.6 C) 98 F (36.7 C) 98.6 F (37 C) 98.5 F (36.9 C)  TempSrc: Oral Oral Oral   SpO2: 99% 93% 97% 100%  Weight:    88.5 kg  Height:        Intake/Output Summary (Last 24 hours) at 01/25/2020 0706 Last data filed at 01/25/2020 0454 Gross per 24 hour  Intake 698 ml  Output 1300 ml  Net -602 ml   Last 3 Weights 01/25/2020 01/24/2020 01/23/2020  Weight (lbs) 195 lb 1.7 oz 192 lb 0.3 oz 190 lb 4.1 oz  Weight (kg) 88.5 kg 87.1 kg 86.3 kg      Telemetry    Sinus - Personally Reviewed  ECG    Not performed today- Personally Reviewed  Physical Exam   GEN:  No acute distress.   Neck: No JVD Cardiac: RRR, no murmurs, rubs, or gallops.  Respiratory: Clear to auscultation bilaterally. GI: Soft, nontender, non-distended  MS: No edema; No deformity. Neuro:  Nonfocal  Psych: Normal affect   Labs    High Sensitivity Troponin:   Recent Labs  Lab 01/16/20 1612 01/16/20 1843 01/16/20 2139 01/18/20 1045 01/21/20 1155  TROPONINIHS 1,229* 1,193* 1,199* 677* 336*      Chemistry Recent Labs  Lab 01/21/20 0512 01/22/20 0400 01/25/20 0447  NA 135 135 137  K 4.1 4.4 3.7  CL 99 98 94*  CO2 24 27 29   GLUCOSE 101* 151* 110*  BUN 44* 56* 50*  CREATININE 1.71* 1.88* 1.84*  CALCIUM 9.0 9.2 9.1  GFRNONAA 38* 34* 35*  GFRAA 44* 39* 40*  ANIONGAP 12 10 14      Hematology Recent Labs  Lab 01/19/20 0434  01/20/20 0414 01/25/20 0447  WBC 7.0 6.3 5.7  RBC 3.32* 3.03* 3.12*  HGB 10.8* 10.0* 10.3*  HCT 32.1* 28.9* 29.6*  MCV 96.7 95.4 94.9  MCH 32.5 33.0 33.0  MCHC 33.6 34.6 34.8  RDW 14.0 13.5 13.0  PLT 158 159 213    BNPNo results for input(s): BNP, PROBNP in the last 168 hours.   DDimer No results for input(s): DDIMER in the last 168 hours.   Radiology    No results found.  Cardiac Studies   ECHO 01/17/2020: IMPRESSIONS   1. Left ventricular ejection fraction, by visual estimation, is 35 to  40%. The left ventricle has low normal function. Left ventricular septal  wall thickness was moderately increased. There is moderately increased  left ventricular hypertrophy.  2. The left ventricle demonstrates regional wall motion abnormalities.  Apical akinesis with midseptal hypokinesis. No significant change in  regional wall motion abnormalities compared to exam from 02/12/2019. Side by  side comparison of images performed.  3. Calcifed, mobile mass in the left ventricle. This has been previously  documented on echocardiograms serially, and is felt to represent a  ruptured chordae tendinae.  4. Global right ventricle has normal systolic function.The right  ventricular size is normal. No increase in right ventricular wall  thickness.  5. Left atrial size was normal.  6. Right atrial size was normal.  7. Mild mitral annular calcification.  8. The mitral valve is normal in structure. Mild mitral valve  regurgitation. No evidence of mitral stenosis.  9. The tricuspid valve is normal in structure. Tricuspid valve  regurgitation is not demonstrated.  10. The aortic valve was not well visualized. Aortic valve regurgitation  is mild. Mild to moderate aortic valve sclerosis/calcification without any  evidence of aortic stenosis.  11. The pulmonic valve was not well visualized. Pulmonic valve  regurgitation is trivial.  12. The inferior vena cava is normal in size with greater  than 50%  respiratory variability, suggesting right atrial pressure of 3 mmHg.  13. There is borderline dilatation of the aortic root measuring 39 mm.   ECHO:02/12/2019 1. The left ventricle has moderate-severely reduced systolic function,  with an ejection fraction of 30-35%. The cavity size was normal. There is  asymmetric septal hypertrophy.. Left ventricular diastolic Doppler  parameters are consistent with impaired  relaxation Elevated mean left atrial pressure.  2. The right ventricle has normal systolic function. The cavity was  normal. There is no increase in right ventricular wall thickness.  3. The aortic valve has an indeterminant number of cusps Moderate  thickening of the aortic valve Moderate calcification  of the aortic valve.  Aortic valve regurgitation is mild to moderate by color flow Doppler. mild  stenosis of the aortic valve.  Moderate aortic annular calcification noted.  4. There is a mobile density within the MV apparatus that appears to be a  rupture chordae. Mild thickening of the mitral valve leaflet. Mild  calcification of the mitral valve leaflet. There is mild mitral annular  calcification present. No evidence of  mitral valve stenosis.  5. The tricuspid valve is normal in structure.  6. The aortic root is normal in size and structure.  7. There is moderate dilatation of the aortic root measuring 41 mm.  8. Moderate hypokinesis of the left ventricular inferior wall.  9. Severe akinesis of the left ventricular anteroseptal wall.  10. Severe akinesis of the left ventricular apical segment.   CATH:02/10/2019  RCA Prox RCA lesion is 50% stenosed. Prox RCA to Mid RCA lesion is 80% stenosed. Mid RCA lesion is 90% stenosed. Mid RCA to Dist RCA lesion is 100% stenosed.  ----- Part of the PDA and PL system was filled via right to right and left-to-right collaterals.  Cx-OM: Ost Cx to Prox Cx lesion is 45% stenosed with 90% stenosed side branch in LAV  Groove.  ------ 3rd LPL-1 lesion is 80% stenosed. 3rd LPL-2 lesion is 99% stenosed.  Prox LAD to Mid LAD lesion is 90% stenosed with 55% stenosed side branch in Ost 2nd Diag.  Mid-distal LAD to Dist LAD lesion is 95% stenosed. Dist LAD lesion is 90% stenosed.  --  LV end diastolic pressure is moderately elevated. Consistent with acute on chronic diastolic heart failure  SUMMARY  Severe multivessel disease: 100% CTO of mid RCA, 50% proximal circumflex with 90% AV groove followed by 100% CTO of LPL (that recanalizes through left left lateral), 90 to 95% bifurcation LAD-2nd Diag followed by 60% and then tandem 99 to 90% lesions in the mid to distal LAD. -- >Minimal good PCI or CABG options  Moderately elevated LVEDP consistent with acute diastolic heart failure  RECOMMENDATIONS 1. The patient will be transferred to a telemetry bed. I plan to restart IV heparin to run for 48 hours for MI. (Will restart 8 hours post sheath removal) 2. Continue aggressive medical management. 3. I discussed the findings with the patient's power of attorney/brother and sister-in-law: Options going forward would be medical management, CABG, random PCI. After discussing with the brother: ? It is clear that PCI is not likely a good option as it would not be a good candidate for dual antiplatelet therapy with frequent falls, he is also was anemic.  ? CABG would not be something that they would be interested in offering based on his dementia, renal insufficiency and Parkinson's. Would not likely recover.  ? Best option going forward would be medical management.   With renal insufficiency, we will hydrate him, but he would likely require diuresis and standing diuretic during this hospitalization.  2D echo was ordered to better assess his EF, however I suspect it to be reduced. t     Patient Profile     79 y.o. male with a hx ofcombined systolic and diastolicCHF(EF [id number]),  ICM,NSTEMI 01/2019 w/ med rx for multi-vessel CAD, HTN, HLD, GERD, ruptured MV chordae, Parkinson's, dementia, thoracic aortic aneurysm,whopresented with substernal chest pain that radiated to his arm. ST elevation on inf and ant leads. Code STEMI called but canceled then. His EKG changes and elevated trop is now consistent with STEMI definition, however [name]  elevation is not as high as expected with current extensive EKG changes. He has not been a candidate for intervention due to comorbidities and is getting medical management. Had some continuous chest pain in first 48 hours but Troponin trended down. 01/19/2020: Now done with 72 h of Heparin.He has had some shoulder pain that improved. (X ray negative for fx of dislocation). No more chest pain.  Assessment & Plan    STEMI:  Medically managed due to being a poor candidate to perform cath. (Patient with Hx of severemultivessel disease, parkinson disease, dementia, also poor candidate for DAPT given hx of frequent falls and increased chance of bleeding).  Symptoms improved. (Troponin levels trended downdespite continuous chest pain initially.Completed 72 hours of heparin and chest pain remained controlled)  VS stable and medically ready to be discharged. Dispo plan has been pending. Social worker has been Office manager. Apparently it was rejected. Also does not seem to take long term benefit from rehab given all co morbidities not having a great long term prognosis. Palliative care consulted to discuss goal of care with family  Patient has been symptoms free and medically ready to be discharged. Dispo plan has been pending.  Leighton Roach DC home today with hospice service and medds rec as below: (Can adjust meds outpatient based on goals of care) -Continue ASA and plavix unintrupted for a month then stop ASA and keep intrupted plavix for 6 months. -Continue Coreg 12.5 mg BID -Continue Amlodipine 10 mg QD -Continue Ranexa 500 mg  BID(started 2/4) -Continue Isordil   Complicated dispo: PT recommended SNF. SNF denied by insurance. Inpatient rehab consulted and denied patient given he can not tolerate rehab. Palliative care consulted and talked to the wife about considering Hospice, -We had a face to face meeting with social worker and case manager and talked to the wife over the phone. She has appealed insurance denial for SNF. Result is pending. Wife agrees with DC home with hospice serive (already arranged), if insurance reject SNF coverage again.  Acute on Chroniccombined systolic and diastolic HF: Stable and diuresed well. Euvolumic.  Ischemic cardiomyopathy. Echo 01/17/2020 EF 35-40%.Regional wall motion abnormality that is suggestive of LAD involvement, reported no change comparingto prior study 2020 Interestingly, despite new troponin elevations and EKG changes, EF appears to be relatively stable.  Electrolytes stable.  -ContinuePOLasix 40 mg BID. - F/u with Dr. Harl Bowie in 1-2 weeks to assess volume status and adjust diuretic as needed and may consider adding ACE inhibitorlater if BP allowsand kidney function stable -ContinueCoreg -BMP in 1-2 week after DC -HF instruction after discharge -Weight daily -fluid restriction  Other chronic medical conditions: Hypothyroidism:  -Continue home Levothyroxine  Parkinson: Dementia: Patient remained stable and calm. -Continue home meds after DC  Urinary incontinency:  -Continue home oxybutynin after DC   For questions or updates, please contact [place] Please consult [email or web address] for contact info under        Signed, Dewayne Hatch, MD  01/25/2020, 7:06 AM    Agree with note by Dr Myrtie Hawk  [name] is stable and ready for discharge.  He is pain-free on appropriate medications.  His exam is benign.  He will need home hospice care.  TOC 7 and follow-up with Dr. Harl Bowie as an outpatient.  Lorretta Harp, M.D., [place], [facility],  [place] [address] [place], [place]  [address]  [phone] 01/25/2020 9:57 AM

## 2020-01-29 ENCOUNTER — Telehealth: Payer: Self-pay

## 2020-01-29 DIAGNOSIS — F0151 Vascular dementia with behavioral disturbance: Secondary | ICD-10-CM | POA: Diagnosis not present

## 2020-01-29 DIAGNOSIS — N1832 Chronic kidney disease, stage 3b: Secondary | ICD-10-CM | POA: Diagnosis not present

## 2020-01-29 DIAGNOSIS — I712 Thoracic aortic aneurysm, without rupture: Secondary | ICD-10-CM | POA: Diagnosis not present

## 2020-01-29 DIAGNOSIS — Z23 Encounter for immunization: Secondary | ICD-10-CM | POA: Diagnosis not present

## 2020-01-29 DIAGNOSIS — I2510 Atherosclerotic heart disease of native coronary artery without angina pectoris: Secondary | ICD-10-CM | POA: Diagnosis not present

## 2020-01-29 DIAGNOSIS — E782 Mixed hyperlipidemia: Secondary | ICD-10-CM | POA: Diagnosis not present

## 2020-01-29 DIAGNOSIS — R32 Unspecified urinary incontinence: Secondary | ICD-10-CM | POA: Diagnosis not present

## 2020-01-29 DIAGNOSIS — G214 Vascular parkinsonism: Secondary | ICD-10-CM | POA: Diagnosis not present

## 2020-01-29 DIAGNOSIS — I5043 Acute on chronic combined systolic (congestive) and diastolic (congestive) heart failure: Secondary | ICD-10-CM | POA: Diagnosis not present

## 2020-01-29 DIAGNOSIS — D509 Iron deficiency anemia, unspecified: Secondary | ICD-10-CM | POA: Diagnosis not present

## 2020-01-29 DIAGNOSIS — Z0001 Encounter for general adult medical examination with abnormal findings: Secondary | ICD-10-CM | POA: Diagnosis not present

## 2020-01-29 NOTE — Telephone Encounter (Signed)
-----   Message from Desma Paganini sent at 01/25/2020  2:25 PM EST ----- TOC apt w/ JB on 02/06/2020

## 2020-01-29 NOTE — Telephone Encounter (Signed)
Called pt., no answer. Left message for pt to return call.  

## 2020-01-29 NOTE — Telephone Encounter (Signed)
  Patient contacted regarding discharge from [facility] on 01/24/2020.  Patient understands to follow up with provider Branch on 02/06/20 at 1120 at [place]. Patient understands discharge instructions? yes Patient understands medications and regiment? yes Patient understands to bring all medications to this visit? yes

## 2020-01-31 ENCOUNTER — Inpatient Hospital Stay (HOSPITAL_COMMUNITY)
Admission: EM | Admit: 2020-01-31 | Discharge: 2020-02-12 | DRG: 177 | Disposition: E | Attending: Internal Medicine | Admitting: Internal Medicine

## 2020-01-31 ENCOUNTER — Other Ambulatory Visit: Payer: Self-pay

## 2020-01-31 ENCOUNTER — Emergency Department (HOSPITAL_COMMUNITY)

## 2020-01-31 ENCOUNTER — Encounter (HOSPITAL_COMMUNITY): Payer: Self-pay

## 2020-01-31 DIAGNOSIS — I255 Ischemic cardiomyopathy: Secondary | ICD-10-CM | POA: Diagnosis present

## 2020-01-31 DIAGNOSIS — R0602 Shortness of breath: Secondary | ICD-10-CM | POA: Diagnosis present

## 2020-01-31 DIAGNOSIS — F0281 Dementia in other diseases classified elsewhere with behavioral disturbance: Secondary | ICD-10-CM | POA: Diagnosis present

## 2020-01-31 DIAGNOSIS — Z809 Family history of malignant neoplasm, unspecified: Secondary | ICD-10-CM | POA: Diagnosis not present

## 2020-01-31 DIAGNOSIS — F02818 Dementia in other diseases classified elsewhere, unspecified severity, with other behavioral disturbance: Secondary | ICD-10-CM | POA: Diagnosis present

## 2020-01-31 DIAGNOSIS — E785 Hyperlipidemia, unspecified: Secondary | ICD-10-CM | POA: Diagnosis not present

## 2020-01-31 DIAGNOSIS — R0789 Other chest pain: Secondary | ICD-10-CM | POA: Diagnosis not present

## 2020-01-31 DIAGNOSIS — Z66 Do not resuscitate: Secondary | ICD-10-CM | POA: Insufficient documentation

## 2020-01-31 DIAGNOSIS — Z7401 Bed confinement status: Secondary | ICD-10-CM

## 2020-01-31 DIAGNOSIS — I213 ST elevation (STEMI) myocardial infarction of unspecified site: Secondary | ICD-10-CM | POA: Diagnosis present

## 2020-01-31 DIAGNOSIS — Z20822 Contact with and (suspected) exposure to covid-19: Secondary | ICD-10-CM | POA: Diagnosis not present

## 2020-01-31 DIAGNOSIS — N179 Acute kidney failure, unspecified: Secondary | ICD-10-CM | POA: Diagnosis not present

## 2020-01-31 DIAGNOSIS — I5042 Chronic combined systolic (congestive) and diastolic (congestive) heart failure: Secondary | ICD-10-CM | POA: Diagnosis present

## 2020-01-31 DIAGNOSIS — E038 Other specified hypothyroidism: Secondary | ICD-10-CM | POA: Diagnosis not present

## 2020-01-31 DIAGNOSIS — N1831 Chronic kidney disease, stage 3a: Secondary | ICD-10-CM | POA: Diagnosis present

## 2020-01-31 DIAGNOSIS — E872 Acidosis: Secondary | ICD-10-CM | POA: Diagnosis present

## 2020-01-31 DIAGNOSIS — N189 Chronic kidney disease, unspecified: Secondary | ICD-10-CM

## 2020-01-31 DIAGNOSIS — I10 Essential (primary) hypertension: Secondary | ICD-10-CM | POA: Diagnosis not present

## 2020-01-31 DIAGNOSIS — K219 Gastro-esophageal reflux disease without esophagitis: Secondary | ICD-10-CM | POA: Diagnosis present

## 2020-01-31 DIAGNOSIS — I4891 Unspecified atrial fibrillation: Secondary | ICD-10-CM | POA: Diagnosis present

## 2020-01-31 DIAGNOSIS — G20A1 Parkinson's disease without dyskinesia, without mention of fluctuations: Secondary | ICD-10-CM | POA: Diagnosis present

## 2020-01-31 DIAGNOSIS — I6521 Occlusion and stenosis of right carotid artery: Secondary | ICD-10-CM | POA: Diagnosis not present

## 2020-01-31 DIAGNOSIS — E039 Hypothyroidism, unspecified: Secondary | ICD-10-CM | POA: Diagnosis not present

## 2020-01-31 DIAGNOSIS — J690 Pneumonitis due to inhalation of food and vomit: Principal | ICD-10-CM | POA: Diagnosis present

## 2020-01-31 DIAGNOSIS — I4430 Unspecified atrioventricular block: Secondary | ICD-10-CM | POA: Diagnosis not present

## 2020-01-31 DIAGNOSIS — Z515 Encounter for palliative care: Secondary | ICD-10-CM | POA: Insufficient documentation

## 2020-01-31 DIAGNOSIS — Z87891 Personal history of nicotine dependence: Secondary | ICD-10-CM

## 2020-01-31 DIAGNOSIS — I739 Peripheral vascular disease, unspecified: Secondary | ICD-10-CM | POA: Diagnosis not present

## 2020-01-31 DIAGNOSIS — R0689 Other abnormalities of breathing: Secondary | ICD-10-CM | POA: Diagnosis not present

## 2020-01-31 DIAGNOSIS — I059 Rheumatic mitral valve disease, unspecified: Secondary | ICD-10-CM | POA: Diagnosis present

## 2020-01-31 DIAGNOSIS — F419 Anxiety disorder, unspecified: Secondary | ICD-10-CM | POA: Diagnosis present

## 2020-01-31 DIAGNOSIS — I130 Hypertensive heart and chronic kidney disease with heart failure and stage 1 through stage 4 chronic kidney disease, or unspecified chronic kidney disease: Secondary | ICD-10-CM | POA: Diagnosis not present

## 2020-01-31 DIAGNOSIS — G20 Parkinson's disease: Secondary | ICD-10-CM | POA: Diagnosis not present

## 2020-01-31 DIAGNOSIS — I2510 Atherosclerotic heart disease of native coronary artery without angina pectoris: Secondary | ICD-10-CM | POA: Diagnosis not present

## 2020-01-31 DIAGNOSIS — J9601 Acute respiratory failure with hypoxia: Secondary | ICD-10-CM | POA: Diagnosis present

## 2020-01-31 DIAGNOSIS — R079 Chest pain, unspecified: Secondary | ICD-10-CM | POA: Diagnosis not present

## 2020-01-31 DIAGNOSIS — Z7189 Other specified counseling: Secondary | ICD-10-CM | POA: Diagnosis not present

## 2020-01-31 DIAGNOSIS — J189 Pneumonia, unspecified organism: Secondary | ICD-10-CM

## 2020-01-31 LAB — CBC
HCT: 33.4 % — ABNORMAL LOW (ref 39.0–52.0)
Hemoglobin: 11.4 g/dL — ABNORMAL LOW (ref 13.0–17.0)
MCH: 32.2 pg (ref 26.0–34.0)
MCHC: 34.1 g/dL (ref 30.0–36.0)
MCV: 94.4 fL (ref 80.0–100.0)
Platelets: 330 10*3/uL (ref 150–400)
RBC: 3.54 MIL/uL — ABNORMAL LOW (ref 4.22–5.81)
RDW: 13 % (ref 11.5–15.5)
WBC: 10.1 10*3/uL (ref 4.0–10.5)
nRBC: 0 % (ref 0.0–0.2)

## 2020-01-31 LAB — RESPIRATORY PANEL BY RT PCR (FLU A&B, COVID)
Influenza A by PCR: NEGATIVE
Influenza B by PCR: NEGATIVE
SARS Coronavirus 2 by RT PCR: NEGATIVE

## 2020-01-31 LAB — COMPREHENSIVE METABOLIC PANEL
ALT: 15 U/L (ref 0–44)
AST: 26 U/L (ref 15–41)
Albumin: 3.4 g/dL — ABNORMAL LOW (ref 3.5–5.0)
Alkaline Phosphatase: 104 U/L (ref 38–126)
Anion gap: 16 — ABNORMAL HIGH (ref 5–15)
BUN: 98 mg/dL — ABNORMAL HIGH (ref 8–23)
CO2: 24 mmol/L (ref 22–32)
Calcium: 8.9 mg/dL (ref 8.9–10.3)
Chloride: 90 mmol/L — ABNORMAL LOW (ref 98–111)
Creatinine, Ser: 4.88 mg/dL — ABNORMAL HIGH (ref 0.61–1.24)
GFR calc Af Amer: 12 mL/min — ABNORMAL LOW (ref >60)
GFR calc non Af Amer: 11 mL/min — ABNORMAL LOW (ref >60)
Glucose, Bld: 136 mg/dL — ABNORMAL HIGH (ref 70–99)
Potassium: 4.4 mmol/L (ref 3.5–5.1)
Sodium: 130 mmol/L — ABNORMAL LOW (ref 135–145)
Total Bilirubin: 0.5 mg/dL (ref 0.3–1.2)
Total Protein: 8.2 g/dL — ABNORMAL HIGH (ref 6.5–8.1)

## 2020-01-31 LAB — TROPONIN I (HIGH SENSITIVITY)
Troponin I (High Sensitivity): 95 ng/L — ABNORMAL HIGH (ref <18)
Troponin I (High Sensitivity): 184 ng/L (ref <18)

## 2020-01-31 LAB — BLOOD GAS, ARTERIAL
Acid-base deficit: 6.5 mmol/L — ABNORMAL HIGH (ref 0.0–2.0)
Bicarbonate: 18.3 mmol/L — ABNORMAL LOW (ref 20.0–28.0)
FIO2: 100
O2 Saturation: 64.3 %
Patient temperature: 36.7
pCO2 arterial: 41.3 mmHg (ref 32.0–48.0)
pH, Arterial: 7.284 — ABNORMAL LOW (ref 7.350–7.450)
pO2, Arterial: 41.9 mmHg — ABNORMAL LOW (ref 83.0–108.0)

## 2020-01-31 LAB — BRAIN NATRIURETIC PEPTIDE: B Natriuretic Peptide: 747 pg/mL — ABNORMAL HIGH (ref 0.0–100.0)

## 2020-01-31 LAB — LACTIC ACID, PLASMA: Lactic Acid, Venous: 0.9 mmol/L (ref 0.5–1.9)

## 2020-01-31 LAB — POC SARS CORONAVIRUS 2 AG -  ED: SARS Coronavirus 2 Ag: NEGATIVE

## 2020-01-31 MED ORDER — SODIUM CHLORIDE 0.9 % IV SOLN: 250.0000 mL | INTRAVENOUS | Status: DC | PRN

## 2020-01-31 MED ORDER — SODIUM CHLORIDE 0.9 % IV SOLN
3.0000 g | Freq: Two times a day (BID) | INTRAVENOUS | Status: DC
  Administered 2020-01-31: 12:00:00 3 g via INTRAVENOUS
  Filled 2020-01-31: qty 8

## 2020-01-31 MED ORDER — SODIUM CHLORIDE 0.9 % IV SOLN
500.0000 mg | INTRAVENOUS | Status: DC
  Administered 2020-01-31: 11:00:00 500 mg via INTRAVENOUS
  Filled 2020-01-31: qty 500

## 2020-01-31 MED ORDER — LORAZEPAM 2 MG/ML IJ SOLN
2.0000 mg | Freq: Four times a day (QID) | INTRAMUSCULAR | Status: DC
  Filled 2020-01-31: qty 1

## 2020-01-31 MED ORDER — SODIUM CHLORIDE 0.9 % IV SOLN
0.5000 mg/h | INTRAVENOUS | Status: DC
  Administered 2020-01-31: 17:00:00 0.5 mg/h via INTRAVENOUS
  Filled 2020-01-31: qty 5

## 2020-01-31 MED ORDER — HYDROMORPHONE BOLUS VIA INFUSION
0.5000 mg | INTRAVENOUS | Status: DC | PRN
  Administered 2020-01-31 (×3): 0.5 mg via INTRAVENOUS
  Filled 2020-01-31: qty 1

## 2020-01-31 MED ORDER — SODIUM CHLORIDE 0.9 % IV SOLN: INTRAVENOUS | Status: DC | PRN

## 2020-01-31 MED ORDER — ONDANSETRON HCL 4 MG/2ML IJ SOLN
4.0000 mg | Freq: Four times a day (QID) | INTRAMUSCULAR | Status: DC | PRN
  Administered 2020-01-31 (×2): 4 mg via INTRAVENOUS
  Filled 2020-01-31 (×2): qty 2

## 2020-01-31 MED ORDER — HALOPERIDOL LACTATE 2 MG/ML PO CONC: 0.5000 mg | ORAL | Status: DC | PRN

## 2020-01-31 MED ORDER — SODIUM CHLORIDE 0.9 % IV BOLUS
500.0000 mL | Freq: Once | INTRAVENOUS | Status: AC
  Administered 2020-01-31: 10:00:00 500 mL via INTRAVENOUS

## 2020-01-31 MED ORDER — ATROPINE SULFATE 1 % OP SOLN
4.0000 [drp] | OPHTHALMIC | Status: DC | PRN
  Filled 2020-01-31: qty 2

## 2020-01-31 MED ORDER — GLYCOPYRROLATE 0.2 MG/ML IJ SOLN: 0.2000 mg | INTRAMUSCULAR | Status: DC | PRN

## 2020-01-31 MED ORDER — MORPHINE SULFATE (PF) 2 MG/ML IV SOLN
2.0000 mg | INTRAVENOUS | Status: DC | PRN
  Administered 2020-01-31: 14:00:00 2 mg via INTRAVENOUS
  Filled 2020-01-31: qty 1

## 2020-01-31 MED ORDER — SODIUM CHLORIDE 0.9% FLUSH: 3.0000 mL | Freq: Two times a day (BID) | INTRAVENOUS | Status: DC

## 2020-01-31 MED ORDER — POLYVINYL ALCOHOL 1.4 % OP SOLN
1.0000 [drp] | Freq: Four times a day (QID) | OPHTHALMIC | Status: DC | PRN
  Filled 2020-01-31: qty 15

## 2020-01-31 MED ORDER — HALOPERIDOL 0.5 MG PO TABS
0.5000 mg | ORAL_TABLET | ORAL | Status: DC | PRN
  Filled 2020-01-31: qty 1

## 2020-01-31 MED ORDER — LORAZEPAM 2 MG/ML IJ SOLN
1.0000 mg | Freq: Once | INTRAMUSCULAR | Status: AC
  Administered 2020-01-31: 14:00:00 1 mg via INTRAVENOUS
  Filled 2020-01-31: qty 1

## 2020-01-31 MED ORDER — GLYCOPYRROLATE 1 MG PO TABS
1.0000 mg | ORAL_TABLET | ORAL | Status: DC | PRN
  Filled 2020-01-31: qty 1

## 2020-01-31 MED ORDER — SODIUM CHLORIDE 0.9% FLUSH: 3.0000 mL | INTRAVENOUS | Status: DC | PRN

## 2020-01-31 MED ORDER — ACETAMINOPHEN 650 MG RE SUPP: 650.0000 mg | Freq: Four times a day (QID) | RECTAL | Status: DC | PRN

## 2020-01-31 MED ORDER — LORAZEPAM 2 MG/ML IJ SOLN
2.0000 mg | INTRAMUSCULAR | Status: DC | PRN
  Administered 2020-01-31: 17:00:00 2 mg via INTRAVENOUS

## 2020-01-31 MED ORDER — HALOPERIDOL LACTATE 5 MG/ML IJ SOLN: 0.5000 mg | INTRAMUSCULAR | Status: DC | PRN

## 2020-01-31 MED ORDER — SODIUM CHLORIDE 0.9 % IV SOLN
2.0000 g | Freq: Once | INTRAVENOUS | Status: AC
  Administered 2020-01-31: 10:00:00 2 g via INTRAVENOUS
  Filled 2020-01-31: qty 20

## 2020-01-31 MED ORDER — ACETAMINOPHEN 325 MG PO TABS: 650.0000 mg | ORAL_TABLET | Freq: Four times a day (QID) | ORAL | Status: DC | PRN

## 2020-01-31 MED ORDER — BIOTENE DRY MOUTH MT LIQD: 15.0000 mL | OROMUCOSAL | Status: DC | PRN

## 2020-02-05 ENCOUNTER — Other Ambulatory Visit: Payer: Self-pay

## 2020-02-06 ENCOUNTER — Ambulatory Visit: Payer: PPO | Admitting: Cardiology

## 2020-02-12 NOTE — Consult Note (Signed)
Consultation Note Date: 02/05/20   Patient Name: [name]  DOB: 1941/05/24  MRN: [id number]  Age / Sex: 79 y.o., male  PCP: Celene Squibb, MD Referring Physician: Barton Dubois, MD  Reason for Consultation: Establishing goals of care  HPI/Patient Profile: 79 y.o. male  with past medical history of Parkinson's dementia, severe multi vessel CAD, HLD, HTN, ruptured MV chordae, STEMI on last admission (not candidate for PCI)- discharged home with [facility] on 01/25/20   admitted on 05-Feb-2020 with chest pain and SOB. Workup thus far reveals elevated troponin, a fib, possible pneumonia and significant kidney injury with Cr 4.88. Patient is now requiring bipap- was satting in 60's on NRB.  Palliative medicine consulted for goal of care.   Clinical Assessment and Goals of Care: Chart reviewed- patient evaluated. At home patient is bedbound reliant on spouse for total care.  Called patient's spouse- she states that Hospice was initiated on discharge- however, services with aide had not yet started. She attempted to call hospice nurse prior to bringing patient to hospital and was instructed to give medication for anxiety- however, she did not have any anxiety medication to administer.  [name] notes that if patient could have been kept comfortable with the medications then she would have kept him at home.  I asked [name] what her hopes were for [place] for this hospital stay- she stated she would like him to "get as better as he can" and be able to return home with Hospice.  I discussed with her continuing aggressive medical care that he is currently receiving - IV antibiotics, bipap, labs- vs transition to full comfort care. Louis requested to continue trial of bipap and antibiotics.    Primary Decision Maker NEXT OF KIN    SUMMARY OF RECOMMENDATIONS -DNR- gold sheet is at patient's bedside -Morphine 2mg   q2hr prn for ongoing dyspnea and agitation -Patient's spouse is en route to hospital- I will continue goals of care discussion with her when she arrives    Code Status/Advance Care Planning:  DNR  Palliative Prophylaxis:   Aspiration and Delirium Protocol  Prognosis:    Unable to determine  Discharge Planning: To Be Determined  Primary Diagnoses: Present on Admission: . SOB (shortness of breath)   I have reviewed the medical record, interviewed the patient and family, and examined the patient. The following aspects are pertinent.  Past Medical History:  Diagnosis Date  . Anxiety   . CAD (coronary artery disease)    a. 01/2019: cath showing severe multivessel disease with 100% CTO of mid RCA, 50% proximal circumflex with 90% AV groove followed by 100% CTO of LPL (that recanalizes through left left lateral), 90 to 95% bifurcation LAD-2nd Diag followed by 60% and then tandem 99 to 90% lesions in the mid to distal LAD. Not candidate for CABG or PCI. Med management recommended.   . Cardiomyopathy ([place])   . Chronic combined systolic and diastolic CHF (congestive heart failure) ([place])   . Dementia ([place])   . GERD (gastroesophageal reflux disease)   .  Hyperlipidemia   . Hypertension   . Incontinence   . 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  . Myocardial infarction ([place])   . Neuromuscular disorder ([place])   . Parkinson's disease ([place])   . Peripheral vascular disease ([place])    Social History   Socioeconomic History  . Marital status: Married    Spouse name: Not on file  . Number of children: Not on file  . Years of education: Not on file  . Highest education level: Not on file  Occupational History  . Not on file  Tobacco Use  . Smoking status: Former Smoker    Packs/day: 0.50    Years: 10.00    Pack years: 5.00    Types: Cigarettes    Quit date: 07/24/1983    Years since quitting: 36.5  . Smokeless tobacco: Never Used  Substance and Sexual Activity    . Alcohol use: No  . Drug use: No  . Sexual activity: Never  Other Topics Concern  . Not on file  Social History Narrative   Patient is married to his wife [name].  In the past, his POA has been his brother [name], however according to Sharma Covert is now starting to have some the same memory issues and she is now therefore the main decision maker.      Via telephone conversation with [name], she indicates that Piercen has a yellow DO NOT RESUSCITATE form that was sent home with him when he left the nursing facility 5 months ago.  I asked her if she wanted the same wish to be respected here, and she said yes.  She was somewhat confused by the difference between CPR and intubation, but with further explanation, she agreed that she would not want either to be done.   Social Determinants of Health   Financial Resource Strain:   . Difficulty of Paying Living Expenses: Not on file  Food Insecurity:   . Worried About Charity fundraiser in the Last Year: Not on file  . Ran Out of Food in the Last Year: Not on file  Transportation Needs:   . Lack of Transportation (Medical): Not on file  . Lack of Transportation (Non-Medical): Not on file  Physical Activity:   . Days of Exercise per Week: Not on file  . Minutes of Exercise per Session: Not on file  Stress:   . Feeling of Stress : Not on file  Social Connections:   . Frequency of Communication with Friends and Family: Not on file  . Frequency of Social Gatherings with Friends and Family: Not on file  . Attends Religious Services: Not on file  . Active Member of Clubs or Organizations: Not on file  . Attends Archivist Meetings: Not on file  . Marital Status: Not on file   Family History  Problem Relation Age of Onset  . Cancer Mother   . Cancer Father   . Heart Problems Brother        has PPM  . Anesthesia problems Neg Hx   . Hypotension Neg Hx   . Malignant hyperthermia Neg Hx   . Pseudochol deficiency Neg Hx     Scheduled Meds: Continuous Infusions: . sodium chloride    . ampicillin-sulbactam (UNASYN) IV Stopped (23-Feb-2020 1328)   PRN Meds:.sodium chloride, morphine injection, ondansetron (ZOFRAN) IV Medications Prior to Admission:  Prior to Admission medications   Medication Sig Start Date End Date Taking? Authorizing Provider  acetaminophen (  TYLENOL) 325 MG tablet Take 2 tablets (650 mg total) by mouth every 6 (six) hours as needed for mild pain (or Fever >/= 101). 07/25/19   Johnson, Clanford L, MD  amLODipine (NORVASC) 10 MG tablet Take 1 tablet (10 mg total) by mouth daily. 01/25/20   Cheryln Manly, NP  aspirin EC 81 MG tablet Take 81 mg by mouth daily.    [provider]  atorvastatin (LIPITOR) 40 MG tablet Take 1 tablet (40 mg total) by mouth daily at 6 PM. 04/19/19   Strader, [place] M, PA-C  carbidopa-levodopa (SINEMET IR) 25-100 MG tablet Take 1 tablet by mouth 2 (two) times daily.    [provider]  carvedilol (COREG) 12.5 MG tablet Take 1 tablet (12.5 mg total) by mouth 2 (two) times daily with a meal. 03/07/19   Strader, [place] M, PA-C  clopidogrel (PLAVIX) 75 MG tablet Take 1 tablet (75 mg total) by mouth daily. 01/25/20   Cheryln Manly, NP  donepezil (ARICEPT) 10 MG tablet Take 10 mg by mouth at bedtime.    [provider]  DULoxetine (CYMBALTA) 60 MG capsule Take 60 mg by mouth at bedtime.     [provider]  ferrous sulfate 325 (65 FE) MG tablet Take 1 tablet (325 mg total) by mouth daily with breakfast. 07/25/19   Johnson, Clanford L, MD  folic acid (FOLVITE) 1 MG tablet Take 1 mg by mouth at bedtime.     [provider]  furosemide (LASIX) 40 MG tablet Take 1 tablet (40 mg total) by mouth 2 (two) times daily. 01/25/20   Cheryln Manly, NP  hydrOXYzine (VISTARIL) 25 MG capsule Take 25 mg by mouth 3 (three) times daily. 12/20/19   [provider]  isosorbide dinitrate (ISORDIL) 20 MG tablet Take 20 mg by mouth 3 (three)  times daily.    [provider]  levothyroxine (SYNTHROID) 100 MCG tablet Take 100 mcg by mouth daily before breakfast.  04/04/19   [provider]  Multiple Vitamins-Minerals (MULTIVITAMIN WITH MINERALS) tablet Take 1 tablet by mouth every morning.     [provider]  nitroGLYCERIN (NITROSTAT) 0.4 MG SL tablet Place 1 tablet (0.4 mg total) under the tongue every 5 (five) minutes as needed for chest pain. 04/19/19   Strader, Fransisco Hertz, PA-C  oxybutynin (DITROPAN) 5 MG tablet Take 5 mg by mouth 2 (two) times daily. 05/07/15   [provider]  Probiotic Product (PROBIOTIC-10 ULTIMATE) CAPS Take 1 capsule by mouth 2 (two) times daily.    [provider]  ranolazine (RANEXA) 500 MG 12 hr tablet Take 1 tablet (500 mg total) by mouth 2 (two) times daily. 01/25/20   Cheryln Manly, NP  rasagiline (AZILECT) 1 MG TABS tablet Take 1 mg by mouth daily. 12/20/19   [provider]  simethicone (MYLICON) 0000000 MG chewable tablet Chew 125 mg by mouth 2 (two) times daily.    [provider]  triamcinolone ointment (KENALOG) 0.1 % Apply 1 application topically 2 (two) times daily. Compound 1:1 with Eucerin. 08/24/19   Valentina Shaggy, MD   No Known Allergies Review of Systems  Physical Exam  Vital Signs: BP 113/60   Pulse 68   Temp 97.6 F (36.4 C) (Oral)   Resp (!) 25   Ht 5\' 8"  (1.727 m)   Wt 88 kg   SpO2 (!) 76%   BMI 29.50 kg/m  Pain Scale: 0-10   Pain Score: 5  SpO2: SpO2: (!) 76 % O2 Device:SpO2: (!) 76 % O2 Flow Rate: .O2 Flow Rate (L/min): 30 L/min  IO: Intake/output summary:   Intake/Output Summary (Last 24 hours) at February 04, 2020 1402 Last data filed at 2020/02/04 1328 Gross per 24 hour  Intake 950 ml  Output --  Net 950 ml    LBM:   Baseline Weight: Weight: 88 kg Most recent weight: Weight: 88 kg     Palliative Assessment/Data:     Thank you for this consult. Palliative medicine will continue to follow and  assist as needed.   Time In: 1300 Time Out: 1415 Time Total: 75 minutes Greater than 50%  of this time was spent counseling and coordinating care related to the above assessment and plan.  Signed by: Mariana Kaufman, AGNP-C Palliative Medicine    Please contact Palliative Medicine Team phone at [phone] for questions and concerns.  For individual provider: See Shea Evans

## 2020-02-12 NOTE — Progress Notes (Signed)
Pt placed on HHFNC. Pt tolerating well

## 2020-02-12 NOTE — Progress Notes (Signed)
Pt continues to take off NRB mask and O2 saturations of 68. Pt is confused and agitated. RN at bedside also. Flow increased to 30L on HHFNC. With NRB mask on and HHFNC 30L 100% O2  saturation is 67%. HOB raised

## 2020-02-12 NOTE — ED Notes (Signed)
Audible moaning, agonal respirations intermittent apnea greater than 15 secs. PRN dose of dilaudid administered.   Report given to floor by primary RN, due to pt status declining pt taken back to room. Charge RN aware.

## 2020-02-12 NOTE — Discharge Summary (Signed)
Death Summary  [name] [id number] DOB: May 06, 1941 DOA: 02/18/20  PCP: Celene Squibb, MD PCP/Office notified: through [facility].  Admit date: 02/18/20 Date of Death: February 19, 2020  Final Diagnoses:  Principal Problem:   SOB (shortness of breath) Active Problems:   Symptomatic stenosis of right carotid artery   Dementia due to Parkinson's disease with behavioral disturbance (HCC)   Essential hypertension   CKD stage G3a/A2, GFR 45-59 and albumin creatinine ratio 30-299 mg/g   Acute on chronic renal failure (HCC)   Parkinson's disease (HCC)   Hypothyroidism   Acute respiratory failure with hypoxia (HCC) right upper lobe PNA/aspiration PNA CAD Chronic combined systolic and diastolic HF GERD Mitral valve disorder Anxiety  DNR Comfort care   History of present illness:  79 y.o. male with past medical history significant Parkinson disease, dementia, gastroesophageal reflux disease, hypertension, hyperlipidemia, mitral valve disorder, coronary artery disease, ischemic cardiomyopathy and recent admission secondary to STEMI (not a candidate for PCI or CABG); who presented to the hospital secondary to increased shortness of breath.  Per patient's wife positive nausea and decreased oral intake approximately 3 days prior to admission; no fever, no focal weakness, no change in his chronic mid chest/shoulder and neck pain since most recent discharge.  Patient's wife expressed no real improvement to the use of nitroglycerin or ranexa. On EMS arrival patient was found with oxygen saturation in the low 70s and acute respiratory distress; work-up in the ED demonstrated right upper lobe infiltrates suggesting aspiration pneumonia/pneumonitis.  Oxygen saturation continued to deteriorate and the patient ended requiring to be placed temporarily on BiPAP.  Further work-up also demonstrated acute on chronic renal failure with a creatinine of 4.88 and significant elevation of BUN.  ABG with a PH of 7.284,  normal CO2 and low bicarb (18), suggesting metabolic acidosis.  Covid test was negative and BNP elevated at 747.  High sensitive troponin 95.  Fluids given, antibiotics started and [facility] contacted to admit patient for further evaluation and management.  Hospital Course:  1-SOB (shortness of breath): In the setting of aspiration pneumonia/pneumonitis -Significant oxygen requirement and distress on presentation to the emergency department; oxygen saturation in the low 70s. -Despite aggressive efforts, initiation of antibiotics and transient use of BiPAP is condition continues deteriorating. -Palliative care consultation, goals of care discussion and advance directive sustained with wife with decision made to keep patient comfortable and withdrawal aggressive interventions. -Patient BiPAP was discontinue, transfer to oxygen supplementation through nasal cannula and is started on Dilaudid drip with as needed Ativan. -Robinul and no other comfort measures initiated as per palliative care service recommendations. -Around 6:55 PM on 2020/02/18 patient peacefully expired.  2-acute on chronic renal failure -Stage IIIa at baseline -Creatinine of 4.88 on admission and signs of metabolic acidosis appreciated. -Nephrotoxic agents discontinued/held -Based on discussion with wife no further blood work made -He was not a candidate for dialysis anyways.  3-Symptomatic stenosis of right carotid artery -Dual antiplatelet discontinued in front of patient's inability to take p.o. medications on presentation. -Plan of care and advance directive was to keep him comfortable and pursue symptomatic management only.   4-Dementia due to Parkinson's disease with behavioral disturbance ([place]) -Will focus on comfort care and symptomatic management only -Holding on any other regimen given inability to successfully take p.o.'s medication on presentation.  5-Essential hypertension -Blood pressure stable on  admission. -Patient unable to safely take any medication by mouth and with plans to focus on symptomatic management only.  6-DNR/DNI -Prior to admission patient was  already DNR/DNI -Wishes will be respected [facility] death was anticipated   7-chronic combined diastolic and systolic heart failure -Despite elevated BNP there was no physical exam suggesting decompensation -Chemistry suggested component of dehydration and pre-renal azotemia.  -plan was to follow daily weights -diuretics and other oral agents held. -Gentle fluid resuscitation given in the ED.  8-elevated troponin -Overall improved from most recent admission of STEMI -Currently 95 was as high as 1,200 on recent admission and at discharge 10 days ago 336. -Unable to take oral medications on presentation -Goal was to focus on comfort care and symptomatic management only.    Time: 25 minutes  Signed:  Barton Dubois  [facility] 02/01/2020, 8:41 AM

## 2020-02-12 NOTE — ED Notes (Signed)
Date and time results received: 02-03-2020 12:29 PM  (use smartphrase ".now" to insert current time)  Test: Troponin Critical Value: 184  Name of Provider Notified: Dyann Kief MD  Orders Received? Or Actions Taken?: na

## 2020-02-12 NOTE — Progress Notes (Signed)
Pharmacy Antibiotic Note  [name] is a 79 y.o. male admitted on 02/08/20 with aspiration pneumonia.  Pharmacy has been consulted for unasyn dosing.  Plan: Unasyn 3gm IV q12h F/U clinical progress, cxs Monitor V/S, labs   Height: 5\' 8"  (172.7 cm) Weight: 194 lb 0.1 oz (88 kg) IBW/kg (Calculated) : 68.4  Temp (24hrs), Avg:97.6 F (36.4 C), Min:97.6 F (36.4 C), Max:97.6 F (36.4 C)  Recent Labs  Lab 01/25/20 0447 08-Feb-2020 0815  WBC 5.7 10.1  CREATININE 1.84* 4.88*  LATICACIDVEN  --  0.9    Estimated Creatinine Clearance: 13.7 mL/min (A) (by C-G formula based on SCr of 4.88 mg/dL (H)).    No Known Allergies  Antimicrobials this admission: unasyn 2.17>> Ceftriaxone and azithromycin x 1 dose each in ED 2/17  Dose adjustments this admission: prn  Microbiology results: No cxs available  Thank you for allowing pharmacy to be a part of this patient's care.  Isac Sarna, BS Pharm D, [place] Clinical Pharmacist Pager [phone] 2020/02/08 11:20 AM

## 2020-02-12 NOTE — ED Notes (Signed)
Changed o2 to NRB, Dr. Sedonia Small at bedside.

## 2020-02-12 NOTE — ED Notes (Signed)
Patient time of death occurred at 18:55, verified by Ginger RN and Luis Abed

## 2020-02-12 NOTE — ED Notes (Signed)
Pt experiencing increased work of breathing, with hypoxia. O2 sats currently reading in the 60s. Respiratory notified. Dyann Kief, MD notified also.   Respiratory recommends Bipap at this time with Ativan for pt discomfort.   Pt pulling nonrebreather off constantly. Not able to maintain appropriate O2 levels due to this.   Pt family notified to come in and sit with pt to provide comfort and increase chance of compliance with treatment.

## 2020-02-12 NOTE — ED Triage Notes (Signed)
Ems reports pt has history of heart blockages that need stents but says unable to have stents placed.  EMS got called out for chest pain that started this morning when he woke up.  Pt's wife heard a loud noise and went in room and saw where pt had turned over a table.  Reports pt didn't fall.  Reports pt is  Bedridden and is under hospice care.  CBG 225.  EMS started IV in left ac.  Wife gave 324mg  asa and 3 nitro without relief.  Denies any cough or fever.    EMS says pt was at [facility] for [facility]I on feb 2.

## 2020-02-12 NOTE — H&P (Signed)
History and Physical    [name] [id number] DOB: 1941-10-14 DOA: 2020-02-14  Referring MD/NP/PA: Dr. Sedonia Small PCP: Celene Squibb, MD  Patient coming from: Home  Chief Complaint: Shortness of breath  HPI: [name] is a 79 y.o. male with past medical history significant Parkinson disease, dementia, gastroesophageal reflux disease, hypertension, hyperlipidemia, mitral valve disorder, coronary artery disease, ischemic cardiomyopathy and recent admission secondary to STEMI (not a candidate for PCI or CABG); who presented to the hospital secondary to increased shortness of breath.  Per patient's wife positive nausea and decreased oral intake approximately 3 days prior to admission; no fever, no focal weakness, no change in his chronic mid chest/shoulder and neck pain since most recent discharge.  Patient's wife expressed no real improvement to the use of nitroglycerin or ranexa. On EMS arrival patient was found with oxygen saturation in the low 70s and acute respiratory distress; work-up in the ED demonstrated right upper lobe infiltrates suggesting aspiration pneumonia/pneumonitis.  Oxygen saturation continued to deteriorate and the patient ended requiring to be placed temporarily on BiPAP.  Further work-up also demonstrated acute on chronic renal failure with a creatinine of 4.88 and significant elevation of BUN.  ABG with a PH of 7.284, normal CO2 and low bicarb (18), suggesting metabolic acidosis.  Covid test was negative and BNP elevated at 747.  High sensitive troponin 95.  Fluids given, antibiotics started and [facility] contacted to admit patient for further evaluation and management.  Past Medical/Surgical History: Past Medical History:  Diagnosis Date  . Anxiety   . CAD (coronary artery disease)    a. 01/2019: cath showing severe multivessel disease with 100% CTO of mid RCA, 50% proximal circumflex with 90% AV groove followed by 100% CTO of LPL (that recanalizes through left left lateral), 90  to 95% bifurcation LAD-2nd Diag followed by 60% and then tandem 99 to 90% lesions in the mid to distal LAD. Not candidate for CABG or PCI. Med management recommended.   . Cardiomyopathy ([place])   . Chronic combined systolic and diastolic CHF (congestive heart failure) ([place])   . Dementia ([place])   . GERD (gastroesophageal reflux disease)   . Hyperlipidemia   . Hypertension   . Incontinence   . 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  . Myocardial infarction ([place])   . Neuromuscular disorder ([place])   . Parkinson's disease ([place])   . Peripheral vascular disease [facility])     Past Surgical History:  Procedure Laterality Date  . BLADDER REPAIR     pt sts "when I had my prostatectomy they cut too much and I have an internal button I have to press in order to release my urine"  . BLADDER SURGERY     2010  pump placed   . CATARACT EXTRACTION W/PHACO  07/28/2011   Procedure: CATARACT EXTRACTION PHACO AND INTRAOCULAR LENS PLACEMENT (IOC);  Surgeon: Elta Guadeloupe T. Gershon Crane;  Location: [facility];  Service: Ophthalmology;  Laterality: Right;  CDE: 20.26  . CATARACT EXTRACTION W/PHACO  09/08/2011   Procedure: CATARACT EXTRACTION PHACO AND INTRAOCULAR LENS PLACEMENT (IOC);  Surgeon: Elta Guadeloupe T. Gershon Crane;  Location: [facility];  Service: Ophthalmology;  Laterality: Left;  CDE:37.31  . ENDARTERECTOMY Right 10/07/2016   Procedure: RIGHT ENDARTERECTOMY CAROTID WITH LIGATION OF INTERNAL CAROTID;  Surgeon: Serafina Mitchell, MD;  Location: [place];  Service: Vascular;  Laterality: Right;  . LEFT HEART CATH AND CORONARY ANGIOGRAPHY N/A 02/10/2019   Procedure: LEFT HEART CATH AND CORONARY ANGIOGRAPHY;  Surgeon:  Leonie Man, MD;  Location: [place] [facility];  Service: Cardiovascular;  Laterality: N/A;  . PATCH ANGIOPLASTY Right 10/07/2016   Procedure: PATCH ANGIOPLASTY USING Rueben Bash BIOLOGIC PATCH;  Surgeon: Serafina Mitchell, MD;  Location: [place];  Service: Vascular;  Laterality: Right;  . PROSTATECTOMY    . TEE WITHOUT  CARDIOVERSION N/A 06/10/2018   Procedure: TRANSESOPHAGEAL ECHOCARDIOGRAM (TEE);  Surgeon: Arnoldo Lenis, MD;  [facility];  Service: Endoscopy;  Laterality: N/A;  . YAG LASER APPLICATION  [id number]   Procedure: YAG LASER APPLICATION;  Surgeon: Elta Guadeloupe T. Gershon Crane, MD;  Location: [facility];  Service: Ophthalmology;  Laterality: Right;    Social History:  reports that he quit smoking about 36 years ago. His smoking use included cigarettes. He has a 5.00 pack-year smoking history. He has never used smokeless tobacco. He reports that he does not drink alcohol or use drugs.  Allergies: No Known Allergies  Family History:  Family History  Problem Relation Age of Onset  . Cancer Mother   . Cancer Father   . Heart Problems Brother        has PPM  . Anesthesia problems Neg Hx   . Hypotension Neg Hx   . Malignant hyperthermia Neg Hx   . Pseudochol deficiency Neg Hx     Prior to Admission medications   Medication Sig Start Date End Date Taking? Authorizing Provider  acetaminophen (TYLENOL) 325 MG tablet Take 2 tablets (650 mg total) by mouth every 6 (six) hours as needed for mild pain (or Fever >/= 101). 07/25/19   Johnson, Clanford L, MD  amLODipine (NORVASC) 10 MG tablet Take 1 tablet (10 mg total) by mouth daily. 01/25/20   Cheryln Manly, NP  aspirin EC 81 MG tablet Take 81 mg by mouth daily.    [provider]  atorvastatin (LIPITOR) 40 MG tablet Take 1 tablet (40 mg total) by mouth daily at 6 PM. 04/19/19   Strader, [place] M, PA-C  carbidopa-levodopa (SINEMET IR) 25-100 MG tablet Take 1 tablet by mouth 2 (two) times daily.    [provider]  carvedilol (COREG) 12.5 MG tablet Take 1 tablet (12.5 mg total) by mouth 2 (two) times daily with a meal. 03/07/19   Strader, [place] M, PA-C  clopidogrel (PLAVIX) 75 MG tablet Take 1 tablet (75 mg total) by mouth daily. 01/25/20   Cheryln Manly, NP  donepezil (ARICEPT) 10 MG tablet Take 10 mg by mouth at bedtime.     [provider]  DULoxetine (CYMBALTA) 60 MG capsule Take 60 mg by mouth at bedtime.     [provider]  ferrous sulfate 325 (65 FE) MG tablet Take 1 tablet (325 mg total) by mouth daily with breakfast. 07/25/19   Johnson, Clanford L, MD  folic acid (FOLVITE) 1 MG tablet Take 1 mg by mouth at bedtime.     [provider]  furosemide (LASIX) 40 MG tablet Take 1 tablet (40 mg total) by mouth 2 (two) times daily. 01/25/20   Cheryln Manly, NP  hydrOXYzine (VISTARIL) 25 MG capsule Take 25 mg by mouth 3 (three) times daily. 12/20/19   [provider]  isosorbide dinitrate (ISORDIL) 20 MG tablet Take 20 mg by mouth 3 (three) times daily.    [provider]  levothyroxine (SYNTHROID) 100 MCG tablet Take 100 mcg by mouth daily before breakfast.  04/04/19   [provider]  Multiple Vitamins-Minerals (MULTIVITAMIN WITH MINERALS) tablet Take 1 tablet by  mouth every morning.     [provider]  nitroGLYCERIN (NITROSTAT) 0.4 MG SL tablet Place 1 tablet (0.4 mg total) under the tongue every 5 (five) minutes as needed for chest pain. 04/19/19   Strader, Fransisco Hertz, PA-C  oxybutynin (DITROPAN) 5 MG tablet Take 5 mg by mouth 2 (two) times daily. 05/07/15   [provider]  Probiotic Product (PROBIOTIC-10 ULTIMATE) CAPS Take 1 capsule by mouth 2 (two) times daily.    [provider]  ranolazine (RANEXA) 500 MG 12 hr tablet Take 1 tablet (500 mg total) by mouth 2 (two) times daily. 01/25/20   Cheryln Manly, NP  rasagiline (AZILECT) 1 MG TABS tablet Take 1 mg by mouth daily. 12/20/19   [provider]  simethicone (MYLICON) 0000000 MG chewable tablet Chew 125 mg by mouth 2 (two) times daily.    [provider]  triamcinolone ointment (KENALOG) 0.1 % Apply 1 application topically 2 (two) times daily. Compound 1:1 with Eucerin. 08/24/19   Valentina Shaggy, MD    Review of Systems:  Negative except as otherwise mentioned  in HPI.   Physical Exam: Vitals:   2020-02-25 1241 Feb 25, 2020 1300 25-Feb-2020 1333 02-25-20 1600  BP:  113/60  (!) 136/58  Pulse:    (!) 58  Resp:  (!) 25    Temp:      TempSrc:      SpO2: (!) 67%  (!) 76% 100%  Weight:      Height:        Constitutional: Afebrile, increased respiratory distress requiring use of BiPAP; unable to properly follow commands after receiving pain medications and anxiolytics. Eyes: PERRL, lids and conjunctivae normal; no icterus, no nystagmus. ENMT: Mucous membranes are moist. Posterior pharynx clear of any exudate or lesions. no thrush Neck: Normal, supple, no masses, no thyromegaly; no JVD on exam. Respiratory: Mild use of accessory muscles; no wheezing, no crackles, positive rhonchi right (right more than left).  Positive tachypnea. Cardiovascular: Regular rate and rhythm, positive systolic murmur, no rubs, no gallops, trace edema bilaterally. Abdomen: no tenderness, no masses palpated. No hepatosplenomegaly. Bowel sounds positive.  Musculoskeletal: no clubbing / cyanosis. No joint deformity upper and lower extremities. Good ROM, no contractures. Normal muscle tone.  Skin: no rashes, lesions, ulcers. No induration Neurologic: Cranial nerves II to XII grossly intact; no focal deficit.  Having difficulty following commands; after anxiolytics and analgesics essentially unresponsive.  Labs on Admission: I have personally reviewed the following labs and imaging studies  CBC: Recent Labs  Lab 01/25/20 0447 02-25-20 0815  WBC 5.7 10.1  HGB 10.3* 11.4*  HCT 29.6* 33.4*  MCV 94.9 94.4  PLT 213 [id number]   Basic Metabolic Panel: Recent Labs  Lab 01/25/20 0447 02/25/20 0815  NA 137 130*  K 3.7 4.4  CL 94* 90*  CO2 29 24  GLUCOSE 110* 136*  BUN 50* 98*  CREATININE 1.84* 4.88*  CALCIUM 9.1 8.9  MG 2.3  --    GFR: Estimated Creatinine Clearance: 13.7 mL/min (A) (by C-G formula based on SCr of 4.88 mg/dL (H)).   Liver Function Tests: Recent Labs  Lab  2020/02/25 0815  AST 26  ALT 15  ALKPHOS 104  BILITOT 0.5  PROT 8.2*  ALBUMIN 3.4*   Urine analysis:    Component Value Date/Time   COLORURINE YELLOW 07/21/2019 1023   APPEARANCEUR CLEAR 07/21/2019 1023   LABSPEC 1.014 07/21/2019 1023   PHURINE 5.0 07/21/2019 1023   GLUCOSEU NEGATIVE 07/21/2019 1023  HGBUR NEGATIVE 07/21/2019 [place] 07/21/2019 1023   [place] 07/21/2019 1023   PROTEINUR NEGATIVE 07/21/2019 1023   NITRITE NEGATIVE 07/21/2019 1023   LEUKOCYTESUR NEGATIVE 07/21/2019 1023    Recent Results (from the past 240 hour(s))  SARS CORONAVIRUS 2 (TAT 6-24 HRS) Nasopharyngeal Nasopharyngeal Swab     Status: None   Collection Time: 01/22/20 12:04 PM   Specimen: Nasopharyngeal Swab  Result Value Ref Range Status   SARS Coronavirus 2 NEGATIVE NEGATIVE Final    Comment: (NOTE) SARS-CoV-2 target nucleic acids are NOT DETECTED. The SARS-CoV-2 RNA is generally detectable in upper and lower respiratory specimens during the acute phase of infection. Negative results do not preclude SARS-CoV-2 infection, do not rule out co-infections with other pathogens, and should not be used as the sole basis for treatment or other patient management decisions. Negative results must be combined with clinical observations, patient history, and epidemiological information. The expected result is Negative. Fact Sheet for Patients: SugarRoll.be Fact Sheet for Healthcare Providers: https://www.woods-mathews.com/ This test is not yet approved or cleared by the [place] FDA and  has been authorized for detection and/or diagnosis of SARS-CoV-2 by FDA under an Emergency Use Authorization (EUA). This EUA will remain  in effect (meaning this test can be used) for the duration of the COVID-19 declaration under Section 56 4(b)(1) of the Act, 21 U.S.C. section 360bbb-3(b)(1), unless the authorization is terminated or revoked  sooner. Performed at [facility], [place] [address] [place], [place] [address]   Respiratory Panel by RT PCR (Flu A&B, Covid) - Nasopharyngeal Swab     Status: None   Collection Time: Feb 23, 2020  9:56 AM   Specimen: Nasopharyngeal Swab  Result Value Ref Range Status   SARS Coronavirus 2 by RT PCR NEGATIVE NEGATIVE Final    Comment: (NOTE) SARS-CoV-2 target nucleic acids are NOT DETECTED. The SARS-CoV-2 RNA is generally detectable in upper respiratoy specimens during the acute phase of infection. The lowest concentration of SARS-CoV-2 viral copies this assay can detect is 131 copies/mL. A negative result does not preclude SARS-Cov-2 infection and should not be used as the sole basis for treatment or other patient management decisions. A negative result may occur with  improper specimen collection/handling, submission of specimen other than nasopharyngeal swab, presence of viral mutation(s) within the areas targeted by this assay, and inadequate number of viral copies (<131 copies/mL). A negative result must be combined with clinical observations, patient history, and epidemiological information. The expected result is Negative. Fact Sheet for Patients:  PinkCheek.be Fact Sheet for Healthcare Providers:  GravelBags.it This test is not yet ap proved or cleared by the [place] FDA and  has been authorized for detection and/or diagnosis of SARS-CoV-2 by FDA under an Emergency Use Authorization (EUA). This EUA will remain  in effect (meaning this test can be used) for the duration of the COVID-19 declaration under Section 564(b)(1) of the Act, 21 U.S.C. section 360bbb-3(b)(1), unless the authorization is terminated or revoked sooner.    Influenza A by PCR NEGATIVE NEGATIVE Final   Influenza B by PCR NEGATIVE NEGATIVE Final    Comment: (NOTE) The Xpert Xpress SARS-CoV-2/FLU/RSV assay is intended as an aid in  the  diagnosis of influenza from Nasopharyngeal swab specimens and  should not be used as a sole basis for treatment. Nasal washings and  aspirates are unacceptable for Xpert Xpress SARS-CoV-2/FLU/RSV  testing. Fact Sheet for Patients: PinkCheek.be Fact Sheet for Healthcare Providers: GravelBags.it This test is not  yet approved or cleared by the [place] and  has been authorized for detection and/or diagnosis of SARS-CoV-2 by  FDA under an Emergency Use Authorization (EUA). This EUA will remain  in effect (meaning this test can be used) for the duration of the  Covid-19 declaration under Section 564(b)(1) of the Act, 21  U.S.C. section 360bbb-3(b)(1), unless the authorization is  terminated or revoked. Performed at [facility], [address] [place], [place] [address]      Radiological Exams on Admission: DG Chest Port 1 View  Result Date: Feb 13, 2020 CLINICAL DATA:  Shortness of breath EXAM: PORTABLE CHEST 1 VIEW COMPARISON:  January 21, 2020 FINDINGS: There is ill-defined opacity in the right upper lobe. The lungs elsewhere are clear. Heart is mildly enlarged with pulmonary vascularity within normal limits. No adenopathy. There is aortic atherosclerosis. No bone lesions. IMPRESSION: Ill-defined opacity right upper lobe, concerning for focal area of pneumonia. Lungs elsewhere clear. Mild cardiac enlargement. Aortic Atherosclerosis (ICD10-I70.0). Electronically Signed   By: Lowella Grip III M.D.   On: February 13, 2020 08:06    EKG: Sinus rhythm, T prolongation; no acute ischemic changes.  Assessment/Plan 1-SOB (shortness of breath): In the setting of aspiration pneumonia/pneumonitis -Significant oxygen requirement and distress -While discussing goals of care with patient's wife he ended requiring the use of BiPAP transiently -Patient with poor quality of life and recent admission secondary to STEMI not being a candidate for  PCI -Underlying history of Parkinson disease and dementia. -After discussing with patient wife and respecting his wishes decision was made to pursued comfort care and symptomatic management only -Palliative care service has been involved into his care and will assist with pain management and anxiolytic medications regimen -Robinul, Dilaudid and Ativan will be ordered following recommendations. -Given his acute decompensation prognosis is very poor and hospital days would not be a surprise. -Depending clinical response and the stability will involve social care for residential hospice if needed.  2-acute on chronic renal failure -Stage IIIa at baseline -Currently with a creatinine of 4.88 and signs of metabolic acidosis -Nephrotoxic agents has been discontinued -Based on discussion with wife no further blood work will be made -Patient will be with goals of care for symptomatic management and comfort only -Not a candidate for dialysis anyways.  3-Symptomatic stenosis of right carotid artery -Dual antiplatelet discontinue at this moment in the setting of poor responsiveness and new episode of aspiration. -Will assess stability to continuation of his chronic oral regimen. -Plan of care is comfort and symptomatic management only.  4-Dementia due to Parkinson's disease with behavioral disturbance ([place]) -Will focus on comfort care and symptomatic management only -Holding on a regimen at this time.  5-Essential hypertension -Blood pressure stable at this time -Patient unable to safely take any medication by mouth at this time -Will focusing on full comfort care and symptomatic management only we will hold antihypertensive agents and follow response/stabilzation.  6-DNR/DNI -Prior to admission patient was already DNR/DNI -Wishes will be respected -Palliative care has been involved and after discussing with family members will focus on comfort care and symptomatic management only -Hospital  death anticipated   7-chronic combined diastolic and systolic heart failure -Despite elevated BNP there is no physical exam suggesting decompensation -Chemistry suggesting dehydration -Patient unable to take oral medications currently -Follow daily weights -Holding diuretics -Gentle fluid resuscitation given in the ED. -BNP is elevated.  8-elevated troponin -Overall improved from most recent admission of STEMI -Currently 95 was as high as 1,200 on recent  admission and at discharge 10 days ago 336. -Unable to take oral medications currently -Plan is to focus on comfort care and symptomatic management.  DVT prophylaxis: No DVT prophylaxis as patient care decided to be comfort and symptomatic management only.  Code Status: DNR/DNI Family Communication: Discussed with patient's wife over the phone at time of admission. Disposition Plan: Patient admitted from home where he was previously receiving hospice care.  Given overall poor prognosis hospital death would not be a surprise; Education officer, museum based on his clinical response will be contacted for residential hospice. Consults called: Palliative care Admission status: Inpatient, length of stay more than 2 midnights; Med-surg bed   Time Spent: 65 minutes  Barton Dubois MD [facility] Pager [phone]  02/15/20, 4:33 PM

## 2020-02-12 NOTE — ED Provider Notes (Signed)
[facility] Emergency Department Provider Note MRN:  [id number]  Arrival date & time: 02/26/20     Chief Complaint   Chest Pain   History of Present Illness   Cope Benzon is a 79 y.o. year-old male with a history of CAD presenting to the ED with chief complaint of chest pain.  Reported fall today, this morning at about 5 AM.  Since then patient complaining of chest pain.  Low oxygen saturations since.  I was unable to obtain an accurate HPI, PMH, or ROS due to the patient's dementia, altered mental status.  Level 5 caveat.  Review of Systems  Positive for chest pain.  Patient's Health History    Past Medical History:  Diagnosis Date  . Anxiety   . CAD (coronary artery disease)    a. 01/2019: cath showing severe multivessel disease with 100% CTO of mid RCA, 50% proximal circumflex with 90% AV groove followed by 100% CTO of LPL (that recanalizes through left left lateral), 90 to 95% bifurcation LAD-2nd Diag followed by 60% and then tandem 99 to 90% lesions in the mid to distal LAD. Not candidate for CABG or PCI. Med management recommended.   . Cardiomyopathy ([place])   . Chronic combined systolic and diastolic CHF (congestive heart failure) ([place])   . Dementia ([place])   . GERD (gastroesophageal reflux disease)   . Hyperlipidemia   . Hypertension   . Incontinence   . 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  . Myocardial infarction ([place])   . Neuromuscular disorder ([place])   . Parkinson's disease ([place])   . Peripheral vascular disease [facility])     Past Surgical History:  Procedure Laterality Date  . BLADDER REPAIR     pt sts "when I had my prostatectomy they cut too much and I have an internal button I have to press in order to release my urine"  . BLADDER SURGERY     2010  pump placed   . CATARACT EXTRACTION W/PHACO  07/28/2011   Procedure: CATARACT EXTRACTION PHACO AND INTRAOCULAR LENS PLACEMENT (IOC);  Surgeon: Elta Guadeloupe T. Gershon Crane;  Location:  [facility];  Service: Ophthalmology;  Laterality: Right;  CDE: 20.26  . CATARACT EXTRACTION W/PHACO  09/08/2011   Procedure: CATARACT EXTRACTION PHACO AND INTRAOCULAR LENS PLACEMENT (IOC);  Surgeon: Elta Guadeloupe T. Gershon Crane;  Location: [facility];  Service: Ophthalmology;  Laterality: Left;  CDE:37.31  . ENDARTERECTOMY Right 10/07/2016   Procedure: RIGHT ENDARTERECTOMY CAROTID WITH LIGATION OF INTERNAL CAROTID;  Surgeon: Serafina Mitchell, MD;  Location: [place];  Service: Vascular;  Laterality: Right;  . LEFT HEART CATH AND CORONARY ANGIOGRAPHY N/A 02/10/2019   Procedure: LEFT HEART CATH AND CORONARY ANGIOGRAPHY;  Surgeon: Leonie Man, MD;  Location: [place] CV LAB;  Service: Cardiovascular;  Laterality: N/A;  . PATCH ANGIOPLASTY Right 10/07/2016   Procedure: PATCH ANGIOPLASTY USING Rueben Bash BIOLOGIC PATCH;  Surgeon: Serafina Mitchell, MD;  Location: [place];  Service: Vascular;  Laterality: Right;  . PROSTATECTOMY    . TEE WITHOUT CARDIOVERSION N/A 06/10/2018   Procedure: TRANSESOPHAGEAL ECHOCARDIOGRAM (TEE);  Surgeon: Arnoldo Lenis, MD;  [facility];  Service: Endoscopy;  Laterality: N/A;  . YAG LASER APPLICATION  [id number]   Procedure: YAG LASER APPLICATION;  Surgeon: Elta Guadeloupe T. Gershon Crane, MD;  Location: [facility];  Service: Ophthalmology;  Laterality: Right;    Family History  Problem Relation Age of Onset  . Cancer Mother   . Cancer Father   .  Heart Problems Brother        has PPM  . Anesthesia problems Neg Hx   . Hypotension Neg Hx   . Malignant hyperthermia Neg Hx   . Pseudochol deficiency Neg Hx     Social History   Socioeconomic History  . Marital status: Married    Spouse name: Not on file  . Number of children: Not on file  . Years of education: Not on file  . Highest education level: Not on file  Occupational History  . Not on file  Tobacco Use  . Smoking status: Former Smoker    Packs/day: 0.50    Years: 10.00    Pack years: 5.00    Types: Cigarettes    Quit date:  07/24/1983    Years since quitting: 36.5  . Smokeless tobacco: Never Used  Substance and Sexual Activity  . Alcohol use: No  . Drug use: No  . Sexual activity: Never  Other Topics Concern  . Not on file  Social History Narrative   Patient is married to his wife [name].  In the past, his POA has been his brother [name], however according to Sharma Covert is now starting to have some the same memory issues and she is now therefore the main decision maker.      Via telephone conversation with [name], she indicates that Brandyn has a yellow DO NOT RESUSCITATE form that was sent home with him when he left the nursing facility 5 months ago.  I asked her if she wanted the same wish to be respected here, and she said yes.  She was somewhat confused by the difference between CPR and intubation, but with further explanation, she agreed that she would not want either to be done.   Social Determinants of Health   Financial Resource Strain:   . Difficulty of Paying Living Expenses: Not on file  Food Insecurity:   . Worried About Charity fundraiser in the Last Year: Not on file  . Ran Out of Food in the Last Year: Not on file  Transportation Needs:   . Lack of Transportation (Medical): Not on file  . Lack of Transportation (Non-Medical): Not on file  Physical Activity:   . Days of Exercise per Week: Not on file  . Minutes of Exercise per Session: Not on file  Stress:   . Feeling of Stress : Not on file  Social Connections:   . Frequency of Communication with Friends and Family: Not on file  . Frequency of Social Gatherings with Friends and Family: Not on file  . Attends Religious Services: Not on file  . Active Member of Clubs or Organizations: Not on file  . Attends Archivist Meetings: Not on file  . Marital Status: Not on file  Intimate Partner Violence:   . Fear of Current or Ex-Partner: Not on file  . Emotionally Abused: Not on file  . Physically Abused: Not on file  . Sexually  Abused: Not on file     Physical Exam   Vitals:   02-07-2020 0703 02/07/20 0859  BP: 101/69   Pulse: 74   Resp: 20   Temp: 97.6 F (36.4 C)   SpO2: (!) 80% 96%    CONSTITUTIONAL: Chronically ill-appearing, NAD NEURO:  Alert and oriented x 3, no focal deficits EYES:  eyes equal and reactive ENT/NECK:  no LAD, no JVD CARDIO: Regular rate, well-perfused, normal S1 and S2 PULM:  CTAB no wheezing or rhonchi, mildly tachypneic  GI/GU:  normal bowel sounds, non-distended, non-tender MSK/SPINE:  No gross deformities, no edema SKIN:  no rash, atraumatic PSYCH:  Appropriate speech and behavior  *Additional and/or pertinent findings included in MDM below  Diagnostic and Interventional Summary    EKG Interpretation  Date/Time:  02/10/20 EST Ventricular Rate:  71 PR Interval:    QRS Duration: 144 QT Interval:  476 QTC Calculation: 518 R Axis:   62 Text Interpretation: Sinus rhythm Prolonged PR interval Left atrial enlargement IVCD, consider atypical LBBB No significant change was found Confirmed by Gerlene Fee [phone]) on 02/10/20 7:08:24 AM      Cardiac Monitoring Interpretation:  Labs Reviewed  CBC - Abnormal; Notable for the following components:      Result Value   RBC 3.54 (*)    Hemoglobin 11.4 (*)    HCT 33.4 (*)    All other components within normal limits  COMPREHENSIVE METABOLIC PANEL - Abnormal; Notable for the following components:   Sodium 130 (*)    Chloride 90 (*)    Glucose, Bld 136 (*)    BUN 98 (*)    Creatinine, Ser 4.88 (*)    Total Protein 8.2 (*)    Albumin 3.4 (*)    GFR calc non Af Amer 11 (*)    GFR calc Af Amer 12 (*)    Anion gap 16 (*)    All other components within normal limits  BRAIN NATRIURETIC PEPTIDE - Abnormal; Notable for the following components:   B Natriuretic Peptide 747.0 (*)    All other components within normal limits  TROPONIN I (HIGH SENSITIVITY) - Abnormal; Notable for the following  components:   Troponin I (High Sensitivity) 95 (*)    All other components within normal limits  RESPIRATORY PANEL BY RT PCR (FLU A&B, COVID)  LACTIC ACID, PLASMA  POC SARS CORONAVIRUS 2 AG -  ED  TROPONIN I (HIGH SENSITIVITY)    DG Chest Port 1 View  Final Result      Medications  cefTRIAXone (ROCEPHIN) 2 g in sodium chloride 0.9 % 100 mL IVPB (has no administration in time range)  azithromycin (ZITHROMAX) 500 mg in sodium chloride 0.9 % 250 mL IVPB (has no administration in time range)  sodium chloride 0.9 % bolus 500 mL (has no administration in time range)     Procedures  /  Critical Care .Critical Care Performed by: Maudie Flakes, MD Authorized by: Maudie Flakes, MD   Critical care provider statement:    Critical care time (minutes):  40   Critical care was necessary to treat or prevent imminent or life-threatening deterioration of the following conditions:  Respiratory failure   Critical care was time spent personally by me on the following activities:  Discussions with consultants, evaluation of patient's response to treatment, examination of patient, ordering and performing treatments and interventions, ordering and review of laboratory studies, ordering and review of radiographic studies, pulse oximetry, re-evaluation of patient's condition, obtaining history from patient or surrogate and review of old charts    ED Course and Medical Decision Making  I have reviewed the triage vital signs, the nursing notes, and pertinent available records from the EMR.  Pertinent labs & imaging results that were available during my care of the patient were reviewed by me and considered in my medical decision making (see below for details).     Recent admission for STEMI that was managed medically, was not a candidate for PCI due to  his multiple comorbidities, severe Parkinson's/dementia.  Was discharged to home hospice.  Unclear if chest pain is due to trauma or cardiac in nature.   EKG demonstrated similar appearance to prior.  More concerning is patient's hypoxia here, in the 70s on 6 L nasal cannula, currently 90% on nonrebreather.  Had a discussion with patient's wife, who is not ready for full comfort care, still interested in testing and diagnosis.  Patient is DNR, not quite DNI as patient's wife would want a repeated discussion if his condition were to deteriorate.  9:30 AM update: X-ray demonstrating signs of pneumonia.  Labs reveal significant AKI.  Patient is requiring 20 L of heated high flow nasal cannula.  Spoke again with patient's wife, who does want patient to be admitted.  Will admit to stepdown unit  Barth Kirks. Sedonia Small, [place] [email or web address]  Final Clinical Impressions(s) / ED Diagnoses     ICD-10-CM   1. Acute renal failure, unspecified acute renal failure type ([place])  N17.9   2. Acute respiratory failure with hypoxia (H[facility])  J96.01   3. Pneumonia due to infectious organism, unspecified laterality, unspecified part of lung  J18.9     ED Discharge Orders    None       Discharge Instructions Discussed with and Provided to Patient:   Discharge Instructions   None       Maudie Flakes, MD February 28, 2020 0930

## 2020-02-12 NOTE — Progress Notes (Signed)
Addendum to previous note:   I met with patient's spouse upon her arrival to hospital. Patient currently on bipap- appears agitated.  We discussed his past health and his health status prior to being admitted. She shares that [name] has had a difficult life, and the onset of his dementia has made his quality of life even worse.  We discussed his current comorbidities and likely trajectories of continued aggressive medical care vs transition to comfort measures only and supporting through end of life process.  [name] stated that she did not want [name] to suffer any further. Her desire is to transition to full comfort measures and discontinue the bipap.  She understands that his prognosis is likely hours to days once he is transitioned to full comfort measures only.   Plan- Start hydromorphone infusion with bolus doses prn for dyspnea, or signs of discomfort- after hydromorphone infusion is started then d/c bipap Scheduled lorazepam IV '2mg'$  q4hr and as needed q4hr Other comfort medications as ordered D/C IV fluids, antibiotics, labs  [name], AGNP-C Palliative Medicine  Please call Palliative Medicine team phone with any questions [phone]. For individual providers please see AMION.  Time in: 1545 Time out: 1640 Total additional time: 55 minutes

## 2020-02-12 NOTE — ED Notes (Signed)
Dilaudid drip  (72ml) wasted upon death. Witnessed waste with Joellyn Rued, RN.

## 2020-02-12 NOTE — Progress Notes (Signed)
Pt place on Bipap after ABG

## 2020-02-12 DEATH — deceased

## 2020-02-23 ENCOUNTER — Ambulatory Visit: Payer: PPO | Admitting: Vascular Surgery

## 2020-04-01 IMAGING — CR DG CHEST 1V PORT
1 series · 1 of 1 positions shown · non-contrast
Comparison: Chest radiograph 03/04/2011

CLINICAL DATA: Shortness of breath

EXAM:
PORTABLE CHEST 1 VIEW

[portable]
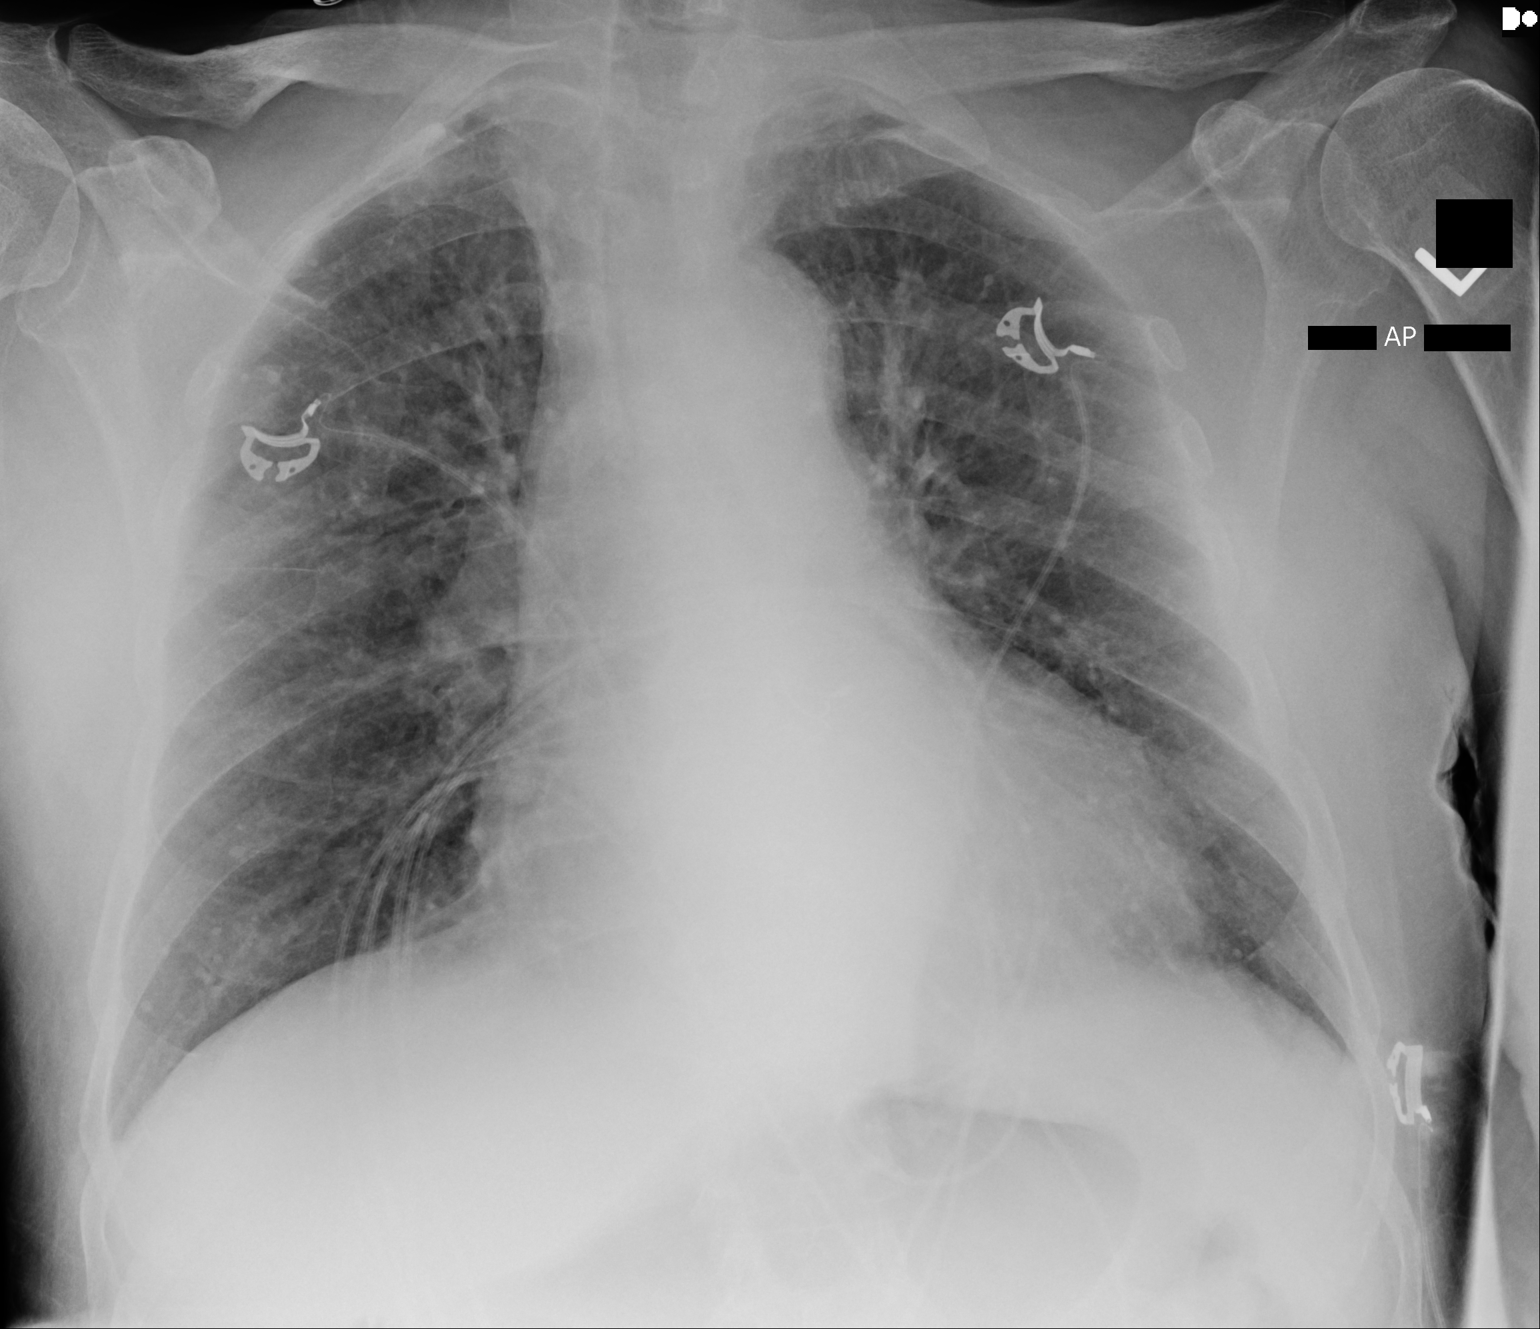

[1 of 1 positions shown; findings below may reference images not displayed]

FINDINGS: There is marked cardiomegaly with central pulmonary vascular
congestion and apical pulmonary venous diversion. No focal airspace
consolidation. No pleural effusion or pneumothorax.
IMPRESSION: Cardiomegaly and apical pulmonary venous diversion without overt
pulmonary edema.

## 2020-12-03 IMAGING — CR DG CHEST 1V PORT
1 series · 1 of 1 positions shown · non-contrast
Comparison: 06/08/2018

CLINICAL DATA: Chest pain, nausea, vomiting

EXAM:
PORTABLE CHEST 1 VIEW

[portable]
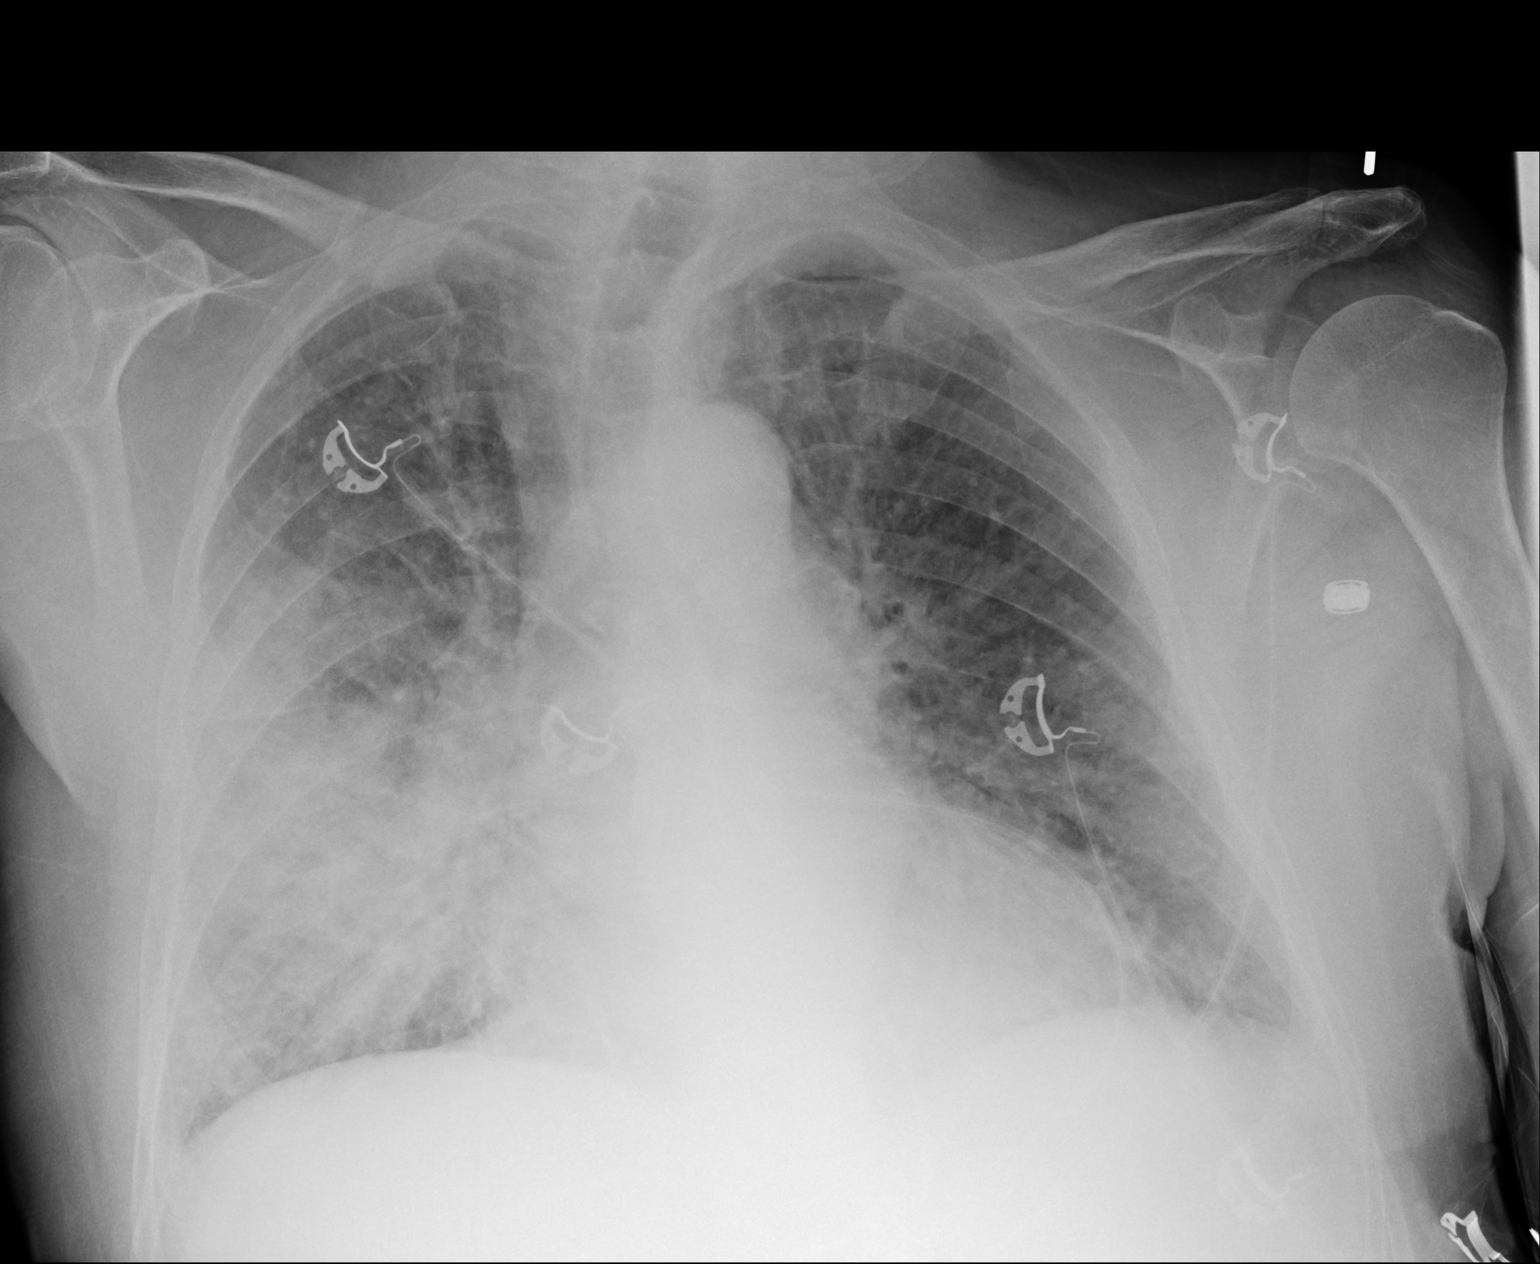

[1 of 1 positions shown; findings below may reference images not displayed]

FINDINGS: Cardiomegaly. Dense consolidation in the right mid and lower lung.
Mild left perihilar airspace opacities. Findings most likely reflect
pneumonia. Small effusions bilaterally. No acute bony abnormality.
IMPRESSION: Consolidation in the right mid and lower lung. Less pronounced left
perihilar airspace opacities. Findings likely reflect pneumonia.

Small bilateral effusions.

## 2021-05-14 IMAGING — CT CT ABDOMEN AND PELVIS WITHOUT CONTRAST
2 of 4 series · 17 of 46 positions shown, 19 images · non-contrast
Comparison: CT angiogram of the chest, abdomen and pelvis
02/10/2019.

CLINICAL DATA: Intermittent vomiting and diarrhea for 4 days.

EXAM:
CT ABDOMEN AND PELVIS WITHOUT CONTRAST
TECHNIQUE: Multidetector CT imaging of the abdomen and pelvis was performed
following the standard protocol without IV contrast.

[Series 3: axial st · axial · 0.78mm/px · z∈[+862,+1282]mm · 14 of 94 slices shown, 16 images]
[im 5/94  soft-tissue]
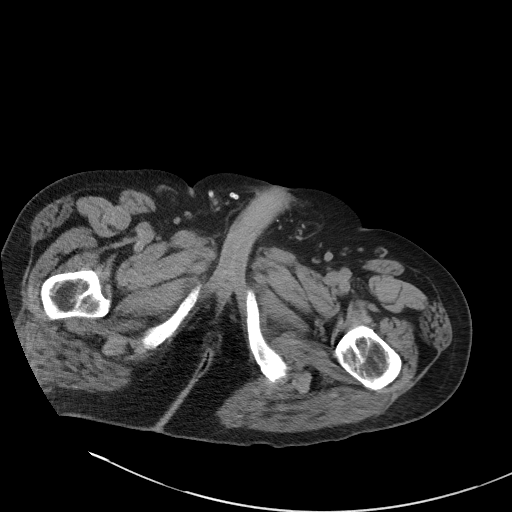
[im 5/94  bone]
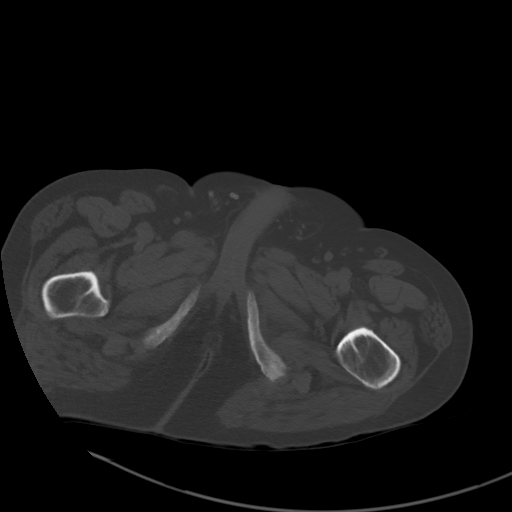
[im 14/94  soft-tissue]
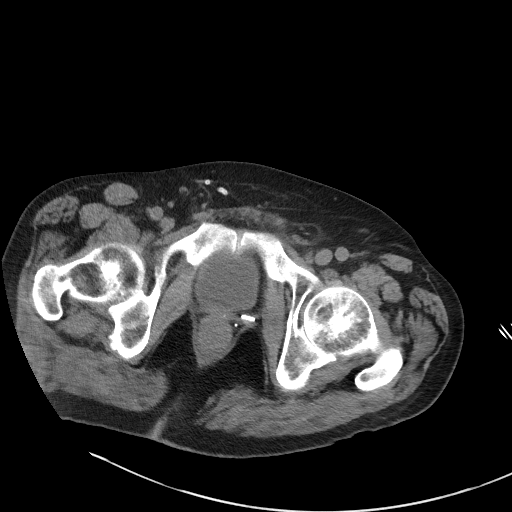
[im 18/94  soft-tissue]
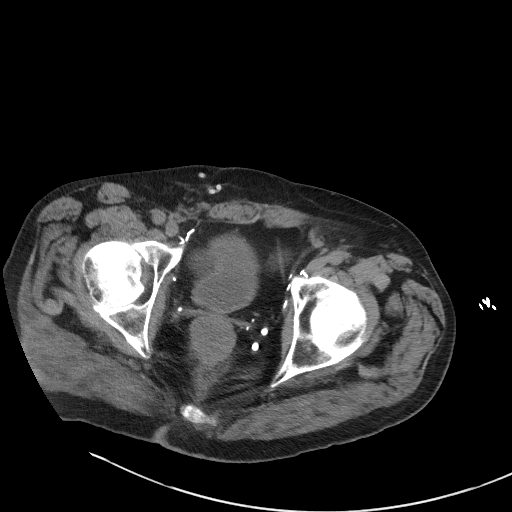
[im 27/94  soft-tissue]
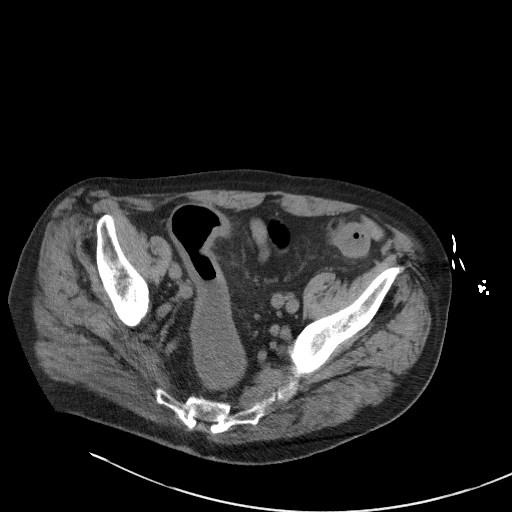
[im 32/94  soft-tissue]
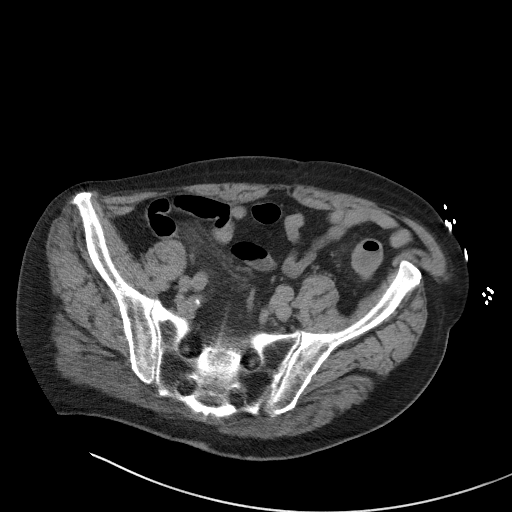
[im 36/94  soft-tissue]
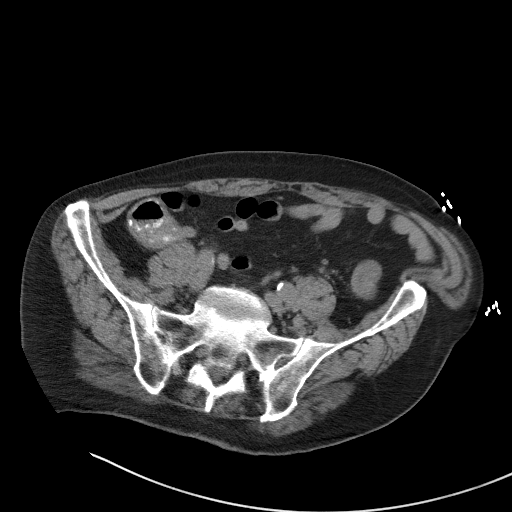
[im 45/94  soft-tissue]
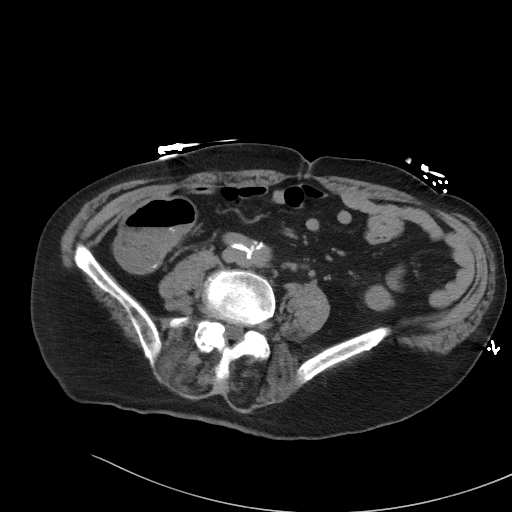
[im 49/94  soft-tissue]
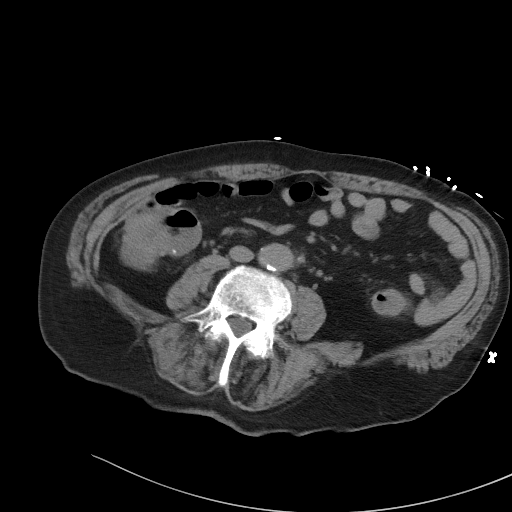
[im 58/94  soft-tissue]
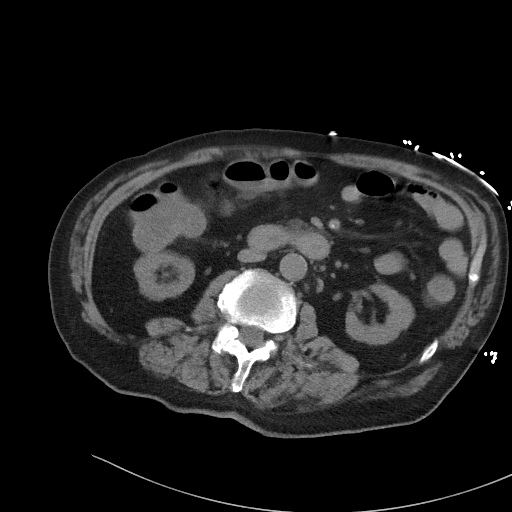
[im 58/94  bone]
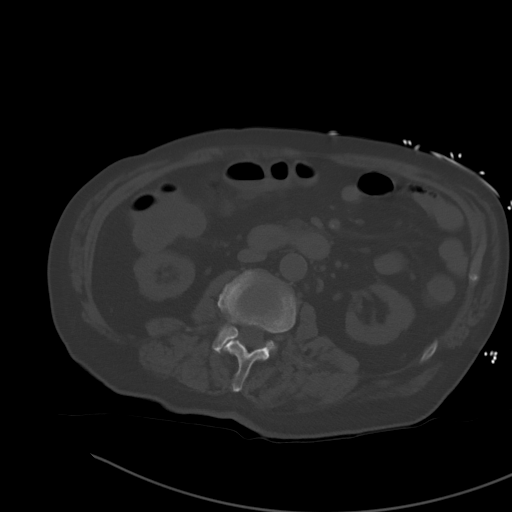
[im 63/94  soft-tissue]
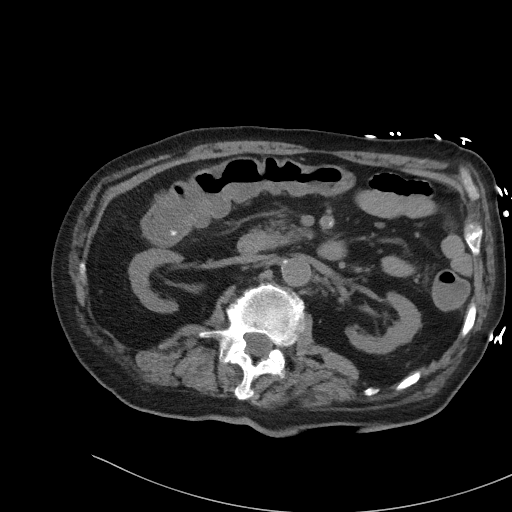
[im 71/94  soft-tissue]
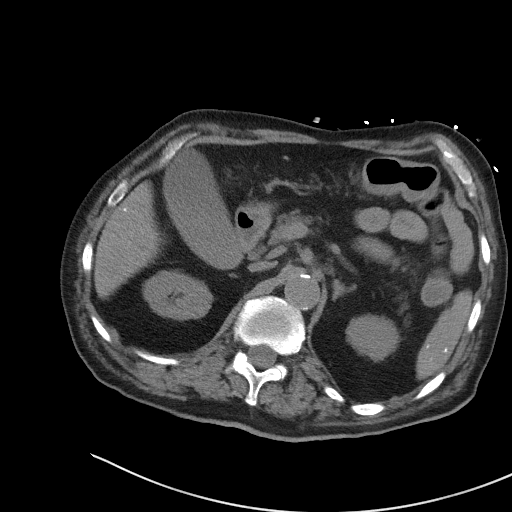
[im 76/94  soft-tissue]
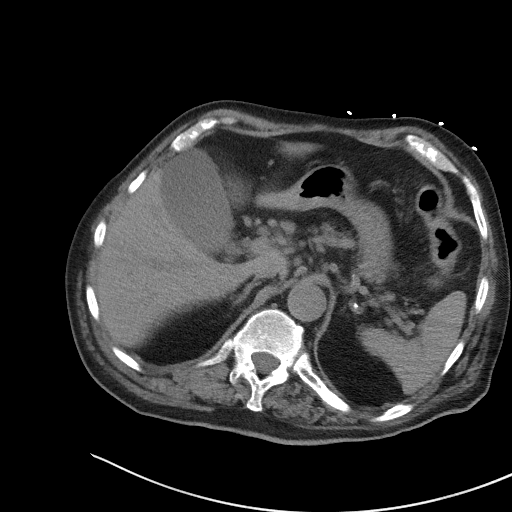
[im 80/94  soft-tissue]
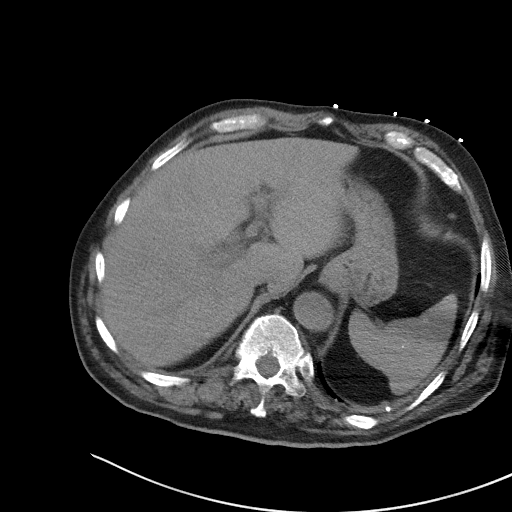
[im 89/94  soft-tissue]
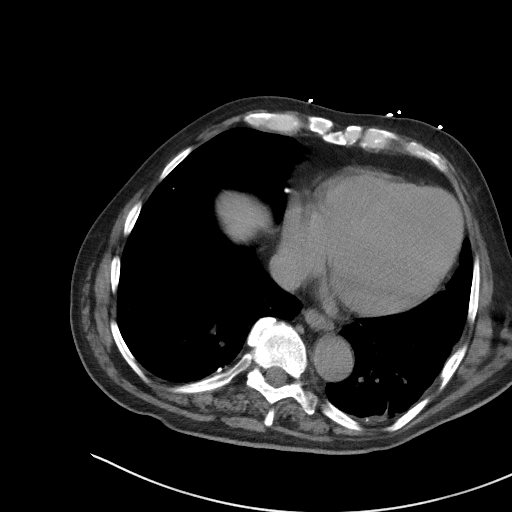

[Series 6: coronal st · coronal · 0.90mm/px · 3 of 91 slices shown]
[im 31/91  soft-tissue]
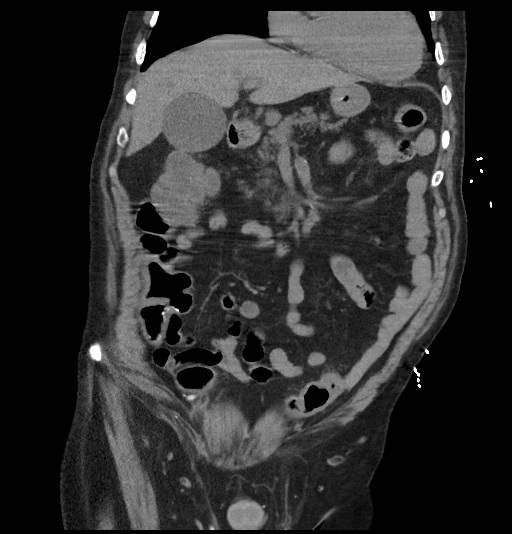
[im 41/91  soft-tissue]
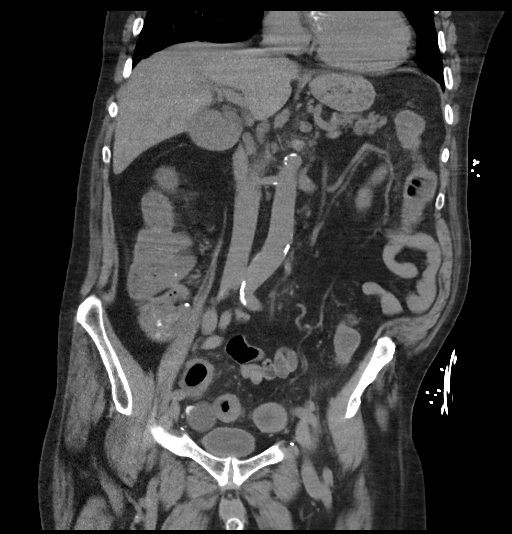
[im 51/91  soft-tissue]
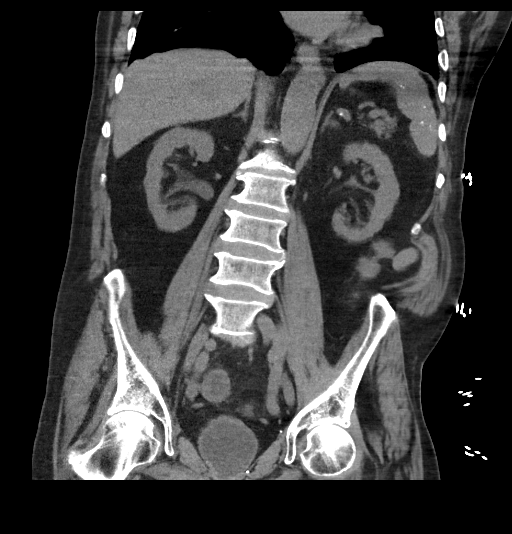

[17 of 46 positions shown; findings below may reference images not displayed]

FINDINGS: Lower chest: Punctate calcified granulomata in the lower lobes
bilaterally noted. Mild linear atelectasis or scar left lung base is
unchanged. No pleural or pericardial effusion. Heart size is normal.

Hepatobiliary: No focal liver abnormality is seen. No gallstones,
gallbladder wall thickening, or biliary dilatation.

Pancreas: Unremarkable. No pancreatic ductal dilatation or
surrounding inflammatory changes.

Spleen: Normal in size. Punctate calcified granulomata within the
spleen noted.

Adrenals/Urinary Tract: Adrenal glands are unremarkable. Kidneys are
normal, without renal calculi, focal lesion, or hydronephrosis.
Bladder is unremarkable.

Stomach/Bowel: Stomach is within normal limits. Status post
appendectomy. No evidence of bowel wall thickening, distention, or
inflammatory changes.

Vascular/Lymphatic: Aortic atherosclerosis. No enlarged abdominal or
pelvic lymph nodes.

Reproductive: Status post prostatectomy.

Other: None.

Musculoskeletal: Avascular necrosis of the femoral heads bilaterally
without fragmentation or collapse. No acute bony abnormality.
IMPRESSION: No acute abnormality or finding to explain the patient's symptoms.

Atherosclerosis.

Avascular necrosis of the femoral heads bilaterally without
fragmentation or collapse.
